# Patient Record
Sex: Male | Born: 1940 | Race: White | Hispanic: No | Smoking: Former smoker
Health system: Southern US, Community
[De-identification: ages and names within clinical notes are randomized; demographics above are authoritative.]

## PROBLEM LIST (undated history)

## (undated) DIAGNOSIS — I509 Heart failure, unspecified: Secondary | ICD-10-CM

## (undated) DIAGNOSIS — J449 Chronic obstructive pulmonary disease, unspecified: Secondary | ICD-10-CM

## (undated) DIAGNOSIS — Z950 Presence of cardiac pacemaker: Secondary | ICD-10-CM

## (undated) DIAGNOSIS — J439 Emphysema, unspecified: Secondary | ICD-10-CM

## (undated) DIAGNOSIS — IMO0001 Reserved for inherently not codable concepts without codable children: Secondary | ICD-10-CM

## (undated) DIAGNOSIS — E7800 Pure hypercholesterolemia, unspecified: Secondary | ICD-10-CM

## (undated) DIAGNOSIS — F329 Major depressive disorder, single episode, unspecified: Secondary | ICD-10-CM

## (undated) DIAGNOSIS — I10 Essential (primary) hypertension: Secondary | ICD-10-CM

## (undated) DIAGNOSIS — Z9981 Dependence on supplemental oxygen: Secondary | ICD-10-CM

## (undated) DIAGNOSIS — F32A Depression, unspecified: Secondary | ICD-10-CM

## (undated) DIAGNOSIS — I219 Acute myocardial infarction, unspecified: Secondary | ICD-10-CM

## (undated) DIAGNOSIS — J189 Pneumonia, unspecified organism: Secondary | ICD-10-CM

## (undated) HISTORY — PX: PACEMAKER PLACEMENT: SHX43

## (undated) HISTORY — PX: INSERT / REPLACE / REMOVE PACEMAKER: SUR710

## (undated) HISTORY — PX: TONSILLECTOMY: SUR1361

## (undated) HISTORY — PX: ANGIOPLASTY: SHX39

## (undated) HISTORY — PX: ABDOMINAL AORTIC ANEURYSM REPAIR: SUR1152

---

## 2004-04-02 ENCOUNTER — Ambulatory Visit: Payer: Self-pay | Admitting: *Deleted

## 2004-04-11 ENCOUNTER — Ambulatory Visit: Payer: Self-pay | Admitting: *Deleted

## 2004-04-16 ENCOUNTER — Ambulatory Visit: Payer: Self-pay | Admitting: *Deleted

## 2004-10-15 ENCOUNTER — Ambulatory Visit: Payer: Self-pay | Admitting: Family Medicine

## 2005-04-24 ENCOUNTER — Ambulatory Visit: Payer: Self-pay | Admitting: Family Medicine

## 2005-10-23 ENCOUNTER — Ambulatory Visit: Payer: Self-pay | Admitting: Family Medicine

## 2009-11-02 ENCOUNTER — Ambulatory Visit: Payer: Self-pay | Admitting: Internal Medicine

## 2011-05-30 ENCOUNTER — Ambulatory Visit: Payer: Self-pay | Admitting: Family Medicine

## 2011-07-22 DIAGNOSIS — E7800 Pure hypercholesterolemia, unspecified: Secondary | ICD-10-CM | POA: Diagnosis not present

## 2011-07-22 DIAGNOSIS — J449 Chronic obstructive pulmonary disease, unspecified: Secondary | ICD-10-CM | POA: Diagnosis not present

## 2011-07-22 DIAGNOSIS — F17200 Nicotine dependence, unspecified, uncomplicated: Secondary | ICD-10-CM | POA: Diagnosis not present

## 2011-07-22 DIAGNOSIS — I2510 Atherosclerotic heart disease of native coronary artery without angina pectoris: Secondary | ICD-10-CM | POA: Diagnosis not present

## 2011-08-12 DIAGNOSIS — I509 Heart failure, unspecified: Secondary | ICD-10-CM | POA: Diagnosis not present

## 2011-08-12 DIAGNOSIS — R0989 Other specified symptoms and signs involving the circulatory and respiratory systems: Secondary | ICD-10-CM | POA: Diagnosis not present

## 2011-08-12 DIAGNOSIS — R6889 Other general symptoms and signs: Secondary | ICD-10-CM | POA: Diagnosis not present

## 2011-08-12 DIAGNOSIS — R748 Abnormal levels of other serum enzymes: Secondary | ICD-10-CM | POA: Diagnosis not present

## 2011-08-12 DIAGNOSIS — R0609 Other forms of dyspnea: Secondary | ICD-10-CM | POA: Diagnosis not present

## 2011-08-12 DIAGNOSIS — I10 Essential (primary) hypertension: Secondary | ICD-10-CM | POA: Diagnosis not present

## 2011-08-12 LAB — CBC
HCT: 48.7 % (ref 40.0–52.0)
HGB: 16.2 g/dL (ref 13.0–18.0)
MCH: 30.2 pg (ref 26.0–34.0)
MCHC: 33.2 g/dL (ref 32.0–36.0)
Platelet: 323 10*3/uL (ref 150–440)
RDW: 14.6 % — ABNORMAL HIGH (ref 11.5–14.5)
WBC: 24.8 10*3/uL — ABNORMAL HIGH (ref 3.8–10.6)

## 2011-08-13 ENCOUNTER — Inpatient Hospital Stay: Payer: Self-pay | Admitting: Specialist

## 2011-08-13 DIAGNOSIS — E785 Hyperlipidemia, unspecified: Secondary | ICD-10-CM | POA: Diagnosis present

## 2011-08-13 DIAGNOSIS — Z23 Encounter for immunization: Secondary | ICD-10-CM | POA: Diagnosis not present

## 2011-08-13 DIAGNOSIS — I2589 Other forms of chronic ischemic heart disease: Secondary | ICD-10-CM | POA: Diagnosis not present

## 2011-08-13 DIAGNOSIS — I129 Hypertensive chronic kidney disease with stage 1 through stage 4 chronic kidney disease, or unspecified chronic kidney disease: Secondary | ICD-10-CM | POA: Diagnosis not present

## 2011-08-13 DIAGNOSIS — I252 Old myocardial infarction: Secondary | ICD-10-CM | POA: Diagnosis not present

## 2011-08-13 DIAGNOSIS — D72829 Elevated white blood cell count, unspecified: Secondary | ICD-10-CM | POA: Diagnosis not present

## 2011-08-13 DIAGNOSIS — Z79899 Other long term (current) drug therapy: Secondary | ICD-10-CM | POA: Diagnosis not present

## 2011-08-13 DIAGNOSIS — J9600 Acute respiratory failure, unspecified whether with hypoxia or hypercapnia: Secondary | ICD-10-CM | POA: Diagnosis not present

## 2011-08-13 DIAGNOSIS — F17200 Nicotine dependence, unspecified, uncomplicated: Secondary | ICD-10-CM | POA: Diagnosis present

## 2011-08-13 DIAGNOSIS — I714 Abdominal aortic aneurysm, without rupture: Secondary | ICD-10-CM | POA: Diagnosis present

## 2011-08-13 DIAGNOSIS — R748 Abnormal levels of other serum enzymes: Secondary | ICD-10-CM | POA: Diagnosis not present

## 2011-08-13 DIAGNOSIS — I10 Essential (primary) hypertension: Secondary | ICD-10-CM | POA: Diagnosis not present

## 2011-08-13 DIAGNOSIS — I5023 Acute on chronic systolic (congestive) heart failure: Secondary | ICD-10-CM | POA: Diagnosis not present

## 2011-08-13 DIAGNOSIS — I509 Heart failure, unspecified: Secondary | ICD-10-CM | POA: Diagnosis not present

## 2011-08-13 DIAGNOSIS — R799 Abnormal finding of blood chemistry, unspecified: Secondary | ICD-10-CM | POA: Diagnosis not present

## 2011-08-13 DIAGNOSIS — Z8249 Family history of ischemic heart disease and other diseases of the circulatory system: Secondary | ICD-10-CM | POA: Diagnosis not present

## 2011-08-13 DIAGNOSIS — J449 Chronic obstructive pulmonary disease, unspecified: Secondary | ICD-10-CM | POA: Diagnosis not present

## 2011-08-13 DIAGNOSIS — Z7982 Long term (current) use of aspirin: Secondary | ICD-10-CM | POA: Diagnosis not present

## 2011-08-13 DIAGNOSIS — I2510 Atherosclerotic heart disease of native coronary artery without angina pectoris: Secondary | ICD-10-CM | POA: Diagnosis present

## 2011-08-13 LAB — BASIC METABOLIC PANEL
Anion Gap: 10 (ref 7–16)
BUN: 21 mg/dL — ABNORMAL HIGH (ref 7–18)
Calcium, Total: 8.7 mg/dL (ref 8.5–10.1)
Chloride: 105 mmol/L (ref 98–107)
EGFR (African American): >60
EGFR (Non-African Amer.): 50 — ABNORMAL LOW
Glucose: 92 mg/dL (ref 65–99)

## 2011-08-13 LAB — URINALYSIS, COMPLETE
Bacteria: NONE SEEN
Glucose,UR: NEGATIVE mg/dL (ref 0–75)
Hyaline Cast: 1
Ketone: NEGATIVE
RBC,UR: 1 /HPF (ref 0–5)
Specific Gravity: 1.005 (ref 1.003–1.030)
WBC UR: 1 /HPF (ref 0–5)

## 2011-08-13 LAB — TROPONIN I
Troponin-I: 0.31 ng/mL — ABNORMAL HIGH
Troponin-I: 1.2 ng/mL — ABNORMAL HIGH
Troponin-I: 1.97 ng/mL — ABNORMAL HIGH

## 2011-08-13 LAB — COMPREHENSIVE METABOLIC PANEL
Albumin: 3.9 g/dL (ref 3.4–5.0)
Alkaline Phosphatase: 90 U/L (ref 50–136)
Anion Gap: 10 (ref 7–16)
Bilirubin,Total: 0.4 mg/dL (ref 0.2–1.0)
Co2: 28 mmol/L (ref 21–32)
Creatinine: 1.57 mg/dL — ABNORMAL HIGH (ref 0.60–1.30)
EGFR (African American): 57 — ABNORMAL LOW
EGFR (Non-African Amer.): 47 — ABNORMAL LOW
Glucose: 188 mg/dL — ABNORMAL HIGH (ref 65–99)
Potassium: 3.8 mmol/L (ref 3.5–5.1)
SGPT (ALT): 20 U/L
Sodium: 143 mmol/L (ref 136–145)
Total Protein: 7.9 g/dL (ref 6.4–8.2)

## 2011-08-14 LAB — CBC WITH DIFFERENTIAL/PLATELET
Basophil #: 0.1 10*3/uL (ref 0.0–0.1)
Basophil %: 0.5 %
Eosinophil #: 0.1 10*3/uL (ref 0.0–0.7)
HGB: 14.4 g/dL (ref 13.0–18.0)
Lymphocyte #: 2.3 10*3/uL (ref 1.0–3.6)
Lymphocyte %: 21.1 %
MCHC: 33 g/dL (ref 32.0–36.0)
MCV: 90 fL (ref 80–100)
Neutrophil #: 7.7 10*3/uL — ABNORMAL HIGH (ref 1.4–6.5)
Neutrophil %: 70.2 %
Platelet: 197 10*3/uL (ref 150–440)
RBC: 4.81 10*6/uL (ref 4.40–5.90)
RDW: 15 % — ABNORMAL HIGH (ref 11.5–14.5)

## 2011-08-14 LAB — COMPREHENSIVE METABOLIC PANEL
Albumin: 3.1 g/dL — ABNORMAL LOW (ref 3.4–5.0)
Alkaline Phosphatase: 67 U/L (ref 50–136)
Bilirubin,Total: 0.4 mg/dL (ref 0.2–1.0)
Calcium, Total: 8.8 mg/dL (ref 8.5–10.1)
Chloride: 103 mmol/L (ref 98–107)
Co2: 28 mmol/L (ref 21–32)
EGFR (African American): 59 — ABNORMAL LOW
Osmolality: 287 (ref 275–301)
SGPT (ALT): 16 U/L
Sodium: 141 mmol/L (ref 136–145)
Total Protein: 6.4 g/dL (ref 6.4–8.2)

## 2011-08-14 LAB — HEMOGLOBIN A1C: Hemoglobin A1C: 6 % (ref 4.2–6.3)

## 2011-08-14 LAB — TSH: Thyroid Stimulating Horm: 1.48 u[IU]/mL

## 2011-08-15 DIAGNOSIS — I129 Hypertensive chronic kidney disease with stage 1 through stage 4 chronic kidney disease, or unspecified chronic kidney disease: Secondary | ICD-10-CM | POA: Diagnosis not present

## 2011-08-15 DIAGNOSIS — I2589 Other forms of chronic ischemic heart disease: Secondary | ICD-10-CM | POA: Diagnosis not present

## 2011-08-15 DIAGNOSIS — I509 Heart failure, unspecified: Secondary | ICD-10-CM | POA: Diagnosis not present

## 2011-08-15 DIAGNOSIS — J449 Chronic obstructive pulmonary disease, unspecified: Secondary | ICD-10-CM | POA: Diagnosis not present

## 2011-08-15 DIAGNOSIS — I5023 Acute on chronic systolic (congestive) heart failure: Secondary | ICD-10-CM | POA: Diagnosis not present

## 2011-08-16 DIAGNOSIS — I5022 Chronic systolic (congestive) heart failure: Secondary | ICD-10-CM | POA: Diagnosis not present

## 2011-08-16 DIAGNOSIS — I059 Rheumatic mitral valve disease, unspecified: Secondary | ICD-10-CM | POA: Diagnosis not present

## 2011-08-16 DIAGNOSIS — I2510 Atherosclerotic heart disease of native coronary artery without angina pectoris: Secondary | ICD-10-CM | POA: Diagnosis not present

## 2011-08-16 DIAGNOSIS — I4891 Unspecified atrial fibrillation: Secondary | ICD-10-CM | POA: Diagnosis not present

## 2011-08-17 DIAGNOSIS — I5023 Acute on chronic systolic (congestive) heart failure: Secondary | ICD-10-CM | POA: Diagnosis not present

## 2011-08-17 DIAGNOSIS — I129 Hypertensive chronic kidney disease with stage 1 through stage 4 chronic kidney disease, or unspecified chronic kidney disease: Secondary | ICD-10-CM | POA: Diagnosis not present

## 2011-08-17 DIAGNOSIS — I509 Heart failure, unspecified: Secondary | ICD-10-CM | POA: Diagnosis not present

## 2011-08-17 DIAGNOSIS — J449 Chronic obstructive pulmonary disease, unspecified: Secondary | ICD-10-CM | POA: Diagnosis not present

## 2011-08-17 DIAGNOSIS — I2589 Other forms of chronic ischemic heart disease: Secondary | ICD-10-CM | POA: Diagnosis not present

## 2011-08-19 DIAGNOSIS — J449 Chronic obstructive pulmonary disease, unspecified: Secondary | ICD-10-CM | POA: Diagnosis not present

## 2011-08-19 DIAGNOSIS — I129 Hypertensive chronic kidney disease with stage 1 through stage 4 chronic kidney disease, or unspecified chronic kidney disease: Secondary | ICD-10-CM | POA: Diagnosis not present

## 2011-08-19 DIAGNOSIS — I5023 Acute on chronic systolic (congestive) heart failure: Secondary | ICD-10-CM | POA: Diagnosis not present

## 2011-08-19 DIAGNOSIS — I2589 Other forms of chronic ischemic heart disease: Secondary | ICD-10-CM | POA: Diagnosis not present

## 2011-08-19 DIAGNOSIS — I509 Heart failure, unspecified: Secondary | ICD-10-CM | POA: Diagnosis not present

## 2011-08-22 DIAGNOSIS — R5381 Other malaise: Secondary | ICD-10-CM | POA: Diagnosis not present

## 2011-08-22 DIAGNOSIS — I2510 Atherosclerotic heart disease of native coronary artery without angina pectoris: Secondary | ICD-10-CM | POA: Diagnosis not present

## 2011-08-22 DIAGNOSIS — J449 Chronic obstructive pulmonary disease, unspecified: Secondary | ICD-10-CM | POA: Diagnosis not present

## 2011-08-22 DIAGNOSIS — E7800 Pure hypercholesterolemia, unspecified: Secondary | ICD-10-CM | POA: Diagnosis not present

## 2011-08-24 DIAGNOSIS — I2589 Other forms of chronic ischemic heart disease: Secondary | ICD-10-CM | POA: Diagnosis not present

## 2011-08-24 DIAGNOSIS — I509 Heart failure, unspecified: Secondary | ICD-10-CM | POA: Diagnosis not present

## 2011-08-24 DIAGNOSIS — I5023 Acute on chronic systolic (congestive) heart failure: Secondary | ICD-10-CM | POA: Diagnosis not present

## 2011-08-24 DIAGNOSIS — I129 Hypertensive chronic kidney disease with stage 1 through stage 4 chronic kidney disease, or unspecified chronic kidney disease: Secondary | ICD-10-CM | POA: Diagnosis not present

## 2011-08-24 DIAGNOSIS — J449 Chronic obstructive pulmonary disease, unspecified: Secondary | ICD-10-CM | POA: Diagnosis not present

## 2011-08-29 DIAGNOSIS — J449 Chronic obstructive pulmonary disease, unspecified: Secondary | ICD-10-CM | POA: Diagnosis not present

## 2011-08-29 DIAGNOSIS — I509 Heart failure, unspecified: Secondary | ICD-10-CM | POA: Diagnosis not present

## 2011-08-29 DIAGNOSIS — I5023 Acute on chronic systolic (congestive) heart failure: Secondary | ICD-10-CM | POA: Diagnosis not present

## 2011-08-29 DIAGNOSIS — I129 Hypertensive chronic kidney disease with stage 1 through stage 4 chronic kidney disease, or unspecified chronic kidney disease: Secondary | ICD-10-CM | POA: Diagnosis not present

## 2011-08-29 DIAGNOSIS — I2589 Other forms of chronic ischemic heart disease: Secondary | ICD-10-CM | POA: Diagnosis not present

## 2011-08-30 DIAGNOSIS — I129 Hypertensive chronic kidney disease with stage 1 through stage 4 chronic kidney disease, or unspecified chronic kidney disease: Secondary | ICD-10-CM | POA: Diagnosis not present

## 2011-08-30 DIAGNOSIS — I2510 Atherosclerotic heart disease of native coronary artery without angina pectoris: Secondary | ICD-10-CM | POA: Diagnosis not present

## 2011-08-30 DIAGNOSIS — I10 Essential (primary) hypertension: Secondary | ICD-10-CM | POA: Diagnosis not present

## 2011-08-30 DIAGNOSIS — E785 Hyperlipidemia, unspecified: Secondary | ICD-10-CM | POA: Diagnosis not present

## 2011-08-30 DIAGNOSIS — I509 Heart failure, unspecified: Secondary | ICD-10-CM | POA: Diagnosis not present

## 2011-08-30 DIAGNOSIS — J449 Chronic obstructive pulmonary disease, unspecified: Secondary | ICD-10-CM | POA: Diagnosis not present

## 2011-08-30 DIAGNOSIS — I5023 Acute on chronic systolic (congestive) heart failure: Secondary | ICD-10-CM | POA: Diagnosis not present

## 2011-09-03 DIAGNOSIS — I509 Heart failure, unspecified: Secondary | ICD-10-CM | POA: Diagnosis not present

## 2011-09-03 DIAGNOSIS — I5023 Acute on chronic systolic (congestive) heart failure: Secondary | ICD-10-CM | POA: Diagnosis not present

## 2011-09-03 DIAGNOSIS — I2589 Other forms of chronic ischemic heart disease: Secondary | ICD-10-CM | POA: Diagnosis not present

## 2011-09-03 DIAGNOSIS — I129 Hypertensive chronic kidney disease with stage 1 through stage 4 chronic kidney disease, or unspecified chronic kidney disease: Secondary | ICD-10-CM | POA: Diagnosis not present

## 2011-09-03 DIAGNOSIS — J449 Chronic obstructive pulmonary disease, unspecified: Secondary | ICD-10-CM | POA: Diagnosis not present

## 2011-09-11 DIAGNOSIS — I10 Essential (primary) hypertension: Secondary | ICD-10-CM | POA: Diagnosis not present

## 2011-09-11 DIAGNOSIS — J449 Chronic obstructive pulmonary disease, unspecified: Secondary | ICD-10-CM | POA: Diagnosis not present

## 2011-09-11 DIAGNOSIS — E7800 Pure hypercholesterolemia, unspecified: Secondary | ICD-10-CM | POA: Diagnosis not present

## 2011-09-11 DIAGNOSIS — I2510 Atherosclerotic heart disease of native coronary artery without angina pectoris: Secondary | ICD-10-CM | POA: Diagnosis not present

## 2011-09-18 DIAGNOSIS — I2589 Other forms of chronic ischemic heart disease: Secondary | ICD-10-CM | POA: Diagnosis not present

## 2011-09-18 DIAGNOSIS — I129 Hypertensive chronic kidney disease with stage 1 through stage 4 chronic kidney disease, or unspecified chronic kidney disease: Secondary | ICD-10-CM | POA: Diagnosis not present

## 2011-09-18 DIAGNOSIS — I5023 Acute on chronic systolic (congestive) heart failure: Secondary | ICD-10-CM | POA: Diagnosis not present

## 2011-09-18 DIAGNOSIS — J449 Chronic obstructive pulmonary disease, unspecified: Secondary | ICD-10-CM | POA: Diagnosis not present

## 2011-09-18 DIAGNOSIS — I509 Heart failure, unspecified: Secondary | ICD-10-CM | POA: Diagnosis not present

## 2011-11-13 DIAGNOSIS — Z79899 Other long term (current) drug therapy: Secondary | ICD-10-CM | POA: Diagnosis not present

## 2011-11-13 DIAGNOSIS — I10 Essential (primary) hypertension: Secondary | ICD-10-CM | POA: Diagnosis not present

## 2011-11-13 DIAGNOSIS — I509 Heart failure, unspecified: Secondary | ICD-10-CM | POA: Diagnosis not present

## 2011-11-13 DIAGNOSIS — I2510 Atherosclerotic heart disease of native coronary artery without angina pectoris: Secondary | ICD-10-CM | POA: Diagnosis not present

## 2011-11-22 DIAGNOSIS — I10 Essential (primary) hypertension: Secondary | ICD-10-CM | POA: Diagnosis not present

## 2011-11-22 DIAGNOSIS — I6529 Occlusion and stenosis of unspecified carotid artery: Secondary | ICD-10-CM | POA: Diagnosis not present

## 2011-11-22 DIAGNOSIS — I714 Abdominal aortic aneurysm, without rupture: Secondary | ICD-10-CM | POA: Diagnosis not present

## 2011-11-26 ENCOUNTER — Ambulatory Visit: Payer: Self-pay | Admitting: Vascular Surgery

## 2011-11-26 DIAGNOSIS — I714 Abdominal aortic aneurysm, without rupture: Secondary | ICD-10-CM | POA: Diagnosis not present

## 2011-11-26 DIAGNOSIS — I716 Thoracoabdominal aortic aneurysm, without rupture: Secondary | ICD-10-CM | POA: Diagnosis not present

## 2011-11-29 DIAGNOSIS — I714 Abdominal aortic aneurysm, without rupture: Secondary | ICD-10-CM | POA: Diagnosis not present

## 2011-11-29 DIAGNOSIS — I6529 Occlusion and stenosis of unspecified carotid artery: Secondary | ICD-10-CM | POA: Diagnosis not present

## 2011-11-29 DIAGNOSIS — I10 Essential (primary) hypertension: Secondary | ICD-10-CM | POA: Diagnosis not present

## 2011-11-29 DIAGNOSIS — I716 Thoracoabdominal aortic aneurysm, without rupture: Secondary | ICD-10-CM | POA: Diagnosis not present

## 2011-12-06 DIAGNOSIS — I209 Angina pectoris, unspecified: Secondary | ICD-10-CM | POA: Diagnosis not present

## 2011-12-06 DIAGNOSIS — I2510 Atherosclerotic heart disease of native coronary artery without angina pectoris: Secondary | ICD-10-CM | POA: Diagnosis not present

## 2011-12-06 DIAGNOSIS — I110 Hypertensive heart disease with heart failure: Secondary | ICD-10-CM | POA: Diagnosis not present

## 2011-12-11 ENCOUNTER — Ambulatory Visit: Payer: Self-pay | Admitting: Vascular Surgery

## 2011-12-11 DIAGNOSIS — I714 Abdominal aortic aneurysm, without rupture: Secondary | ICD-10-CM | POA: Diagnosis not present

## 2011-12-11 DIAGNOSIS — Z01812 Encounter for preprocedural laboratory examination: Secondary | ICD-10-CM | POA: Diagnosis not present

## 2011-12-11 LAB — BASIC METABOLIC PANEL
Anion Gap: 9 (ref 7–16)
Calcium, Total: 9 mg/dL (ref 8.5–10.1)
Chloride: 104 mmol/L (ref 98–107)
Glucose: 96 mg/dL (ref 65–99)
Osmolality: 288 (ref 275–301)
Sodium: 141 mmol/L (ref 136–145)

## 2011-12-11 LAB — CBC
HCT: 42.9 % (ref 40.0–52.0)
HGB: 14.4 g/dL (ref 13.0–18.0)
MCH: 30.4 pg (ref 26.0–34.0)
MCV: 91 fL (ref 80–100)
RBC: 4.72 10*6/uL (ref 4.40–5.90)
RDW: 13.9 % (ref 11.5–14.5)

## 2011-12-13 DIAGNOSIS — I209 Angina pectoris, unspecified: Secondary | ICD-10-CM | POA: Diagnosis not present

## 2011-12-13 DIAGNOSIS — E782 Mixed hyperlipidemia: Secondary | ICD-10-CM | POA: Diagnosis not present

## 2011-12-13 DIAGNOSIS — I2510 Atherosclerotic heart disease of native coronary artery without angina pectoris: Secondary | ICD-10-CM | POA: Diagnosis not present

## 2011-12-13 DIAGNOSIS — I5022 Chronic systolic (congestive) heart failure: Secondary | ICD-10-CM | POA: Diagnosis not present

## 2011-12-13 DIAGNOSIS — I059 Rheumatic mitral valve disease, unspecified: Secondary | ICD-10-CM | POA: Diagnosis not present

## 2011-12-16 DIAGNOSIS — I2510 Atherosclerotic heart disease of native coronary artery without angina pectoris: Secondary | ICD-10-CM | POA: Diagnosis not present

## 2011-12-16 DIAGNOSIS — I059 Rheumatic mitral valve disease, unspecified: Secondary | ICD-10-CM | POA: Diagnosis not present

## 2011-12-16 DIAGNOSIS — E782 Mixed hyperlipidemia: Secondary | ICD-10-CM | POA: Diagnosis not present

## 2011-12-16 DIAGNOSIS — I5022 Chronic systolic (congestive) heart failure: Secondary | ICD-10-CM | POA: Diagnosis not present

## 2011-12-16 DIAGNOSIS — I209 Angina pectoris, unspecified: Secondary | ICD-10-CM | POA: Diagnosis not present

## 2011-12-18 ENCOUNTER — Inpatient Hospital Stay: Payer: Self-pay | Admitting: Vascular Surgery

## 2011-12-18 DIAGNOSIS — F17200 Nicotine dependence, unspecified, uncomplicated: Secondary | ICD-10-CM | POA: Diagnosis present

## 2011-12-18 DIAGNOSIS — E785 Hyperlipidemia, unspecified: Secondary | ICD-10-CM | POA: Diagnosis present

## 2011-12-18 DIAGNOSIS — I4891 Unspecified atrial fibrillation: Secondary | ICD-10-CM | POA: Diagnosis present

## 2011-12-18 DIAGNOSIS — J449 Chronic obstructive pulmonary disease, unspecified: Secondary | ICD-10-CM | POA: Diagnosis not present

## 2011-12-18 DIAGNOSIS — I10 Essential (primary) hypertension: Secondary | ICD-10-CM | POA: Diagnosis not present

## 2011-12-18 DIAGNOSIS — I252 Old myocardial infarction: Secondary | ICD-10-CM | POA: Diagnosis not present

## 2011-12-18 DIAGNOSIS — I129 Hypertensive chronic kidney disease with stage 1 through stage 4 chronic kidney disease, or unspecified chronic kidney disease: Secondary | ICD-10-CM | POA: Diagnosis not present

## 2011-12-18 DIAGNOSIS — I6529 Occlusion and stenosis of unspecified carotid artery: Secondary | ICD-10-CM | POA: Diagnosis present

## 2011-12-18 DIAGNOSIS — I2510 Atherosclerotic heart disease of native coronary artery without angina pectoris: Secondary | ICD-10-CM | POA: Diagnosis present

## 2011-12-18 DIAGNOSIS — Z8249 Family history of ischemic heart disease and other diseases of the circulatory system: Secondary | ICD-10-CM | POA: Diagnosis not present

## 2011-12-18 DIAGNOSIS — I509 Heart failure, unspecified: Secondary | ICD-10-CM | POA: Diagnosis not present

## 2011-12-18 DIAGNOSIS — Z7902 Long term (current) use of antithrombotics/antiplatelets: Secondary | ICD-10-CM | POA: Diagnosis not present

## 2011-12-18 DIAGNOSIS — I714 Abdominal aortic aneurysm, without rupture, unspecified: Secondary | ICD-10-CM | POA: Diagnosis not present

## 2011-12-18 DIAGNOSIS — J438 Other emphysema: Secondary | ICD-10-CM | POA: Diagnosis present

## 2011-12-18 DIAGNOSIS — N189 Chronic kidney disease, unspecified: Secondary | ICD-10-CM | POA: Diagnosis not present

## 2011-12-18 DIAGNOSIS — I428 Other cardiomyopathies: Secondary | ICD-10-CM | POA: Diagnosis present

## 2011-12-19 LAB — COMPREHENSIVE METABOLIC PANEL
Albumin: 2.9 g/dL — ABNORMAL LOW (ref 3.4–5.0)
BUN: 23 mg/dL — ABNORMAL HIGH (ref 7–18)
Bilirubin,Total: 0.6 mg/dL (ref 0.2–1.0)
Chloride: 105 mmol/L (ref 98–107)
Co2: 26 mmol/L (ref 21–32)
Creatinine: 1.63 mg/dL — ABNORMAL HIGH (ref 0.60–1.30)
Glucose: 100 mg/dL — ABNORMAL HIGH (ref 65–99)
Osmolality: 281 (ref 275–301)
Potassium: 4 mmol/L (ref 3.5–5.1)
SGPT (ALT): 11 U/L — ABNORMAL LOW
Sodium: 139 mmol/L (ref 136–145)
Total Protein: 5.8 g/dL — ABNORMAL LOW (ref 6.4–8.2)

## 2011-12-19 LAB — PROTIME-INR
INR: 1.2
Prothrombin Time: 15.4 secs — ABNORMAL HIGH (ref 11.5–14.7)

## 2011-12-19 LAB — CBC WITH DIFFERENTIAL/PLATELET
Basophil %: 0.5 %
Eosinophil #: 0.2 10*3/uL (ref 0.0–0.7)
Eosinophil %: 1.8 %
HCT: 38 % — ABNORMAL LOW (ref 40.0–52.0)
MCHC: 33.5 g/dL (ref 32.0–36.0)
MCV: 91 fL (ref 80–100)
Monocyte #: 0.9 x10 3/mm (ref 0.2–1.0)
Monocyte %: 9 %
Neutrophil #: 7.9 10*3/uL — ABNORMAL HIGH (ref 1.4–6.5)
Neutrophil %: 74.6 %
Platelet: 178 10*3/uL (ref 150–440)
RBC: 4.16 10*6/uL — ABNORMAL LOW (ref 4.40–5.90)
RDW: 13.5 % (ref 11.5–14.5)
WBC: 10.6 10*3/uL (ref 3.8–10.6)

## 2011-12-19 LAB — PHOSPHORUS: Phosphorus: 3.4 mg/dL (ref 2.5–4.9)

## 2011-12-19 LAB — MAGNESIUM: Magnesium: 1.9 mg/dL

## 2011-12-19 LAB — APTT: Activated PTT: 32 secs (ref 23.6–35.9)

## 2012-01-23 DIAGNOSIS — I714 Abdominal aortic aneurysm, without rupture: Secondary | ICD-10-CM | POA: Diagnosis not present

## 2012-02-19 DIAGNOSIS — I509 Heart failure, unspecified: Secondary | ICD-10-CM | POA: Diagnosis not present

## 2012-02-19 DIAGNOSIS — I714 Abdominal aortic aneurysm, without rupture: Secondary | ICD-10-CM | POA: Diagnosis not present

## 2012-02-19 DIAGNOSIS — I110 Hypertensive heart disease with heart failure: Secondary | ICD-10-CM | POA: Diagnosis not present

## 2012-02-19 DIAGNOSIS — I059 Rheumatic mitral valve disease, unspecified: Secondary | ICD-10-CM | POA: Diagnosis not present

## 2012-02-19 DIAGNOSIS — I5022 Chronic systolic (congestive) heart failure: Secondary | ICD-10-CM | POA: Diagnosis not present

## 2012-02-26 DIAGNOSIS — Z79899 Other long term (current) drug therapy: Secondary | ICD-10-CM | POA: Diagnosis not present

## 2012-03-19 DIAGNOSIS — J449 Chronic obstructive pulmonary disease, unspecified: Secondary | ICD-10-CM | POA: Diagnosis not present

## 2012-03-19 DIAGNOSIS — F329 Major depressive disorder, single episode, unspecified: Secondary | ICD-10-CM | POA: Diagnosis not present

## 2012-03-19 DIAGNOSIS — I10 Essential (primary) hypertension: Secondary | ICD-10-CM | POA: Diagnosis not present

## 2012-03-19 DIAGNOSIS — I2510 Atherosclerotic heart disease of native coronary artery without angina pectoris: Secondary | ICD-10-CM | POA: Diagnosis not present

## 2012-04-20 DIAGNOSIS — E782 Mixed hyperlipidemia: Secondary | ICD-10-CM | POA: Diagnosis not present

## 2012-04-20 DIAGNOSIS — I5022 Chronic systolic (congestive) heart failure: Secondary | ICD-10-CM | POA: Diagnosis not present

## 2012-04-20 DIAGNOSIS — I2510 Atherosclerotic heart disease of native coronary artery without angina pectoris: Secondary | ICD-10-CM | POA: Diagnosis not present

## 2012-04-20 DIAGNOSIS — R609 Edema, unspecified: Secondary | ICD-10-CM | POA: Diagnosis not present

## 2012-04-23 DIAGNOSIS — Z23 Encounter for immunization: Secondary | ICD-10-CM | POA: Diagnosis not present

## 2012-04-23 DIAGNOSIS — F329 Major depressive disorder, single episode, unspecified: Secondary | ICD-10-CM | POA: Diagnosis not present

## 2012-05-01 DIAGNOSIS — I714 Abdominal aortic aneurysm, without rupture: Secondary | ICD-10-CM | POA: Diagnosis not present

## 2012-05-01 DIAGNOSIS — I10 Essential (primary) hypertension: Secondary | ICD-10-CM | POA: Diagnosis not present

## 2012-06-05 DIAGNOSIS — I2589 Other forms of chronic ischemic heart disease: Secondary | ICD-10-CM | POA: Diagnosis not present

## 2012-06-05 DIAGNOSIS — I5022 Chronic systolic (congestive) heart failure: Secondary | ICD-10-CM | POA: Diagnosis not present

## 2012-06-05 DIAGNOSIS — I4949 Other premature depolarization: Secondary | ICD-10-CM | POA: Diagnosis not present

## 2012-06-05 DIAGNOSIS — Z8679 Personal history of other diseases of the circulatory system: Secondary | ICD-10-CM | POA: Diagnosis not present

## 2012-06-25 ENCOUNTER — Ambulatory Visit: Payer: Self-pay | Admitting: Family Medicine

## 2012-06-25 DIAGNOSIS — J449 Chronic obstructive pulmonary disease, unspecified: Secondary | ICD-10-CM | POA: Diagnosis not present

## 2012-06-25 DIAGNOSIS — R05 Cough: Secondary | ICD-10-CM | POA: Diagnosis not present

## 2012-06-25 DIAGNOSIS — F329 Major depressive disorder, single episode, unspecified: Secondary | ICD-10-CM | POA: Diagnosis not present

## 2012-06-25 DIAGNOSIS — Z23 Encounter for immunization: Secondary | ICD-10-CM | POA: Diagnosis not present

## 2012-06-25 DIAGNOSIS — J209 Acute bronchitis, unspecified: Secondary | ICD-10-CM | POA: Diagnosis not present

## 2012-07-21 ENCOUNTER — Ambulatory Visit: Payer: Self-pay | Admitting: Cardiovascular Disease

## 2012-07-21 DIAGNOSIS — Z8679 Personal history of other diseases of the circulatory system: Secondary | ICD-10-CM | POA: Diagnosis not present

## 2012-07-21 DIAGNOSIS — Z01812 Encounter for preprocedural laboratory examination: Secondary | ICD-10-CM | POA: Diagnosis not present

## 2012-07-21 DIAGNOSIS — I509 Heart failure, unspecified: Secondary | ICD-10-CM | POA: Diagnosis not present

## 2012-07-21 DIAGNOSIS — I110 Hypertensive heart disease with heart failure: Secondary | ICD-10-CM | POA: Diagnosis not present

## 2012-07-21 DIAGNOSIS — I2589 Other forms of chronic ischemic heart disease: Secondary | ICD-10-CM | POA: Diagnosis not present

## 2012-07-21 DIAGNOSIS — I4949 Other premature depolarization: Secondary | ICD-10-CM | POA: Diagnosis not present

## 2012-07-21 DIAGNOSIS — I5022 Chronic systolic (congestive) heart failure: Secondary | ICD-10-CM | POA: Diagnosis not present

## 2012-07-21 LAB — URINALYSIS, COMPLETE
Bacteria: NONE SEEN
Bilirubin,UR: NEGATIVE
Blood: NEGATIVE
Glucose,UR: NEGATIVE mg/dL (ref 0–75)
Ketone: NEGATIVE
Leukocyte Esterase: NEGATIVE
RBC,UR: <1 /HPF (ref 0–5)
Specific Gravity: 1.006 (ref 1.003–1.030)
Squamous Epithelial: <1

## 2012-07-21 LAB — CBC WITH DIFFERENTIAL/PLATELET
Basophil #: 0.1 10*3/uL (ref 0.0–0.1)
Eosinophil %: 4 %
HGB: 13.6 g/dL (ref 13.0–18.0)
Lymphocyte %: 24.1 %
MCH: 29.1 pg (ref 26.0–34.0)
MCHC: 33.6 g/dL (ref 32.0–36.0)
Monocyte %: 8 %
Neutrophil #: 6.1 10*3/uL (ref 1.4–6.5)
Neutrophil %: 63 %
Platelet: 266 10*3/uL (ref 150–440)
RBC: 4.68 10*6/uL (ref 4.40–5.90)
WBC: 9.7 10*3/uL (ref 3.8–10.6)

## 2012-07-21 LAB — BASIC METABOLIC PANEL
BUN: 32 mg/dL — ABNORMAL HIGH (ref 7–18)
Calcium, Total: 9 mg/dL (ref 8.5–10.1)
Chloride: 105 mmol/L (ref 98–107)
EGFR (African American): 45 — ABNORMAL LOW
EGFR (Non-African Amer.): 39 — ABNORMAL LOW
Glucose: 99 mg/dL (ref 65–99)
Potassium: 4.4 mmol/L (ref 3.5–5.1)
Sodium: 138 mmol/L (ref 136–145)

## 2012-07-21 LAB — PROTIME-INR
INR: 1.1
Prothrombin Time: 14.3 secs (ref 11.5–14.7)

## 2012-07-24 ENCOUNTER — Ambulatory Visit: Payer: Self-pay | Admitting: Cardiology

## 2012-07-24 DIAGNOSIS — Z79899 Other long term (current) drug therapy: Secondary | ICD-10-CM | POA: Diagnosis not present

## 2012-07-24 DIAGNOSIS — I509 Heart failure, unspecified: Secondary | ICD-10-CM | POA: Diagnosis not present

## 2012-07-24 DIAGNOSIS — I499 Cardiac arrhythmia, unspecified: Secondary | ICD-10-CM | POA: Diagnosis not present

## 2012-07-24 DIAGNOSIS — I5022 Chronic systolic (congestive) heart failure: Secondary | ICD-10-CM | POA: Diagnosis not present

## 2012-07-24 DIAGNOSIS — Z7982 Long term (current) use of aspirin: Secondary | ICD-10-CM | POA: Diagnosis not present

## 2012-07-24 DIAGNOSIS — I259 Chronic ischemic heart disease, unspecified: Secondary | ICD-10-CM | POA: Diagnosis not present

## 2012-07-24 DIAGNOSIS — J449 Chronic obstructive pulmonary disease, unspecified: Secondary | ICD-10-CM | POA: Diagnosis not present

## 2012-07-24 DIAGNOSIS — I2589 Other forms of chronic ischemic heart disease: Secondary | ICD-10-CM | POA: Diagnosis not present

## 2012-07-25 DIAGNOSIS — I2589 Other forms of chronic ischemic heart disease: Secondary | ICD-10-CM | POA: Diagnosis not present

## 2012-07-25 DIAGNOSIS — J449 Chronic obstructive pulmonary disease, unspecified: Secondary | ICD-10-CM | POA: Diagnosis not present

## 2012-07-25 DIAGNOSIS — I509 Heart failure, unspecified: Secondary | ICD-10-CM | POA: Diagnosis not present

## 2012-07-25 DIAGNOSIS — Z7982 Long term (current) use of aspirin: Secondary | ICD-10-CM | POA: Diagnosis not present

## 2012-07-25 DIAGNOSIS — I428 Other cardiomyopathies: Secondary | ICD-10-CM | POA: Diagnosis not present

## 2012-07-25 DIAGNOSIS — Z79899 Other long term (current) drug therapy: Secondary | ICD-10-CM | POA: Diagnosis not present

## 2012-07-25 DIAGNOSIS — Z950 Presence of cardiac pacemaker: Secondary | ICD-10-CM | POA: Diagnosis not present

## 2012-07-27 DIAGNOSIS — I714 Abdominal aortic aneurysm, without rupture: Secondary | ICD-10-CM | POA: Diagnosis not present

## 2012-07-27 DIAGNOSIS — J449 Chronic obstructive pulmonary disease, unspecified: Secondary | ICD-10-CM | POA: Diagnosis not present

## 2012-07-27 DIAGNOSIS — F329 Major depressive disorder, single episode, unspecified: Secondary | ICD-10-CM | POA: Diagnosis not present

## 2012-07-27 DIAGNOSIS — I2510 Atherosclerotic heart disease of native coronary artery without angina pectoris: Secondary | ICD-10-CM | POA: Diagnosis not present

## 2012-07-27 DIAGNOSIS — E7800 Pure hypercholesterolemia, unspecified: Secondary | ICD-10-CM | POA: Diagnosis not present

## 2012-07-27 DIAGNOSIS — E875 Hyperkalemia: Secondary | ICD-10-CM | POA: Diagnosis not present

## 2012-08-13 DIAGNOSIS — I5022 Chronic systolic (congestive) heart failure: Secondary | ICD-10-CM | POA: Diagnosis not present

## 2012-09-15 DIAGNOSIS — I2510 Atherosclerotic heart disease of native coronary artery without angina pectoris: Secondary | ICD-10-CM | POA: Diagnosis not present

## 2012-09-15 DIAGNOSIS — Z95810 Presence of automatic (implantable) cardiac defibrillator: Secondary | ICD-10-CM | POA: Diagnosis not present

## 2012-09-15 DIAGNOSIS — I10 Essential (primary) hypertension: Secondary | ICD-10-CM | POA: Diagnosis not present

## 2012-09-15 DIAGNOSIS — R0989 Other specified symptoms and signs involving the circulatory and respiratory systems: Secondary | ICD-10-CM | POA: Diagnosis not present

## 2012-09-15 DIAGNOSIS — R0609 Other forms of dyspnea: Secondary | ICD-10-CM | POA: Diagnosis not present

## 2012-10-20 DIAGNOSIS — I059 Rheumatic mitral valve disease, unspecified: Secondary | ICD-10-CM | POA: Diagnosis not present

## 2012-10-20 DIAGNOSIS — I472 Ventricular tachycardia: Secondary | ICD-10-CM | POA: Diagnosis not present

## 2012-10-20 DIAGNOSIS — I5022 Chronic systolic (congestive) heart failure: Secondary | ICD-10-CM | POA: Diagnosis not present

## 2012-10-20 DIAGNOSIS — I2510 Atherosclerotic heart disease of native coronary artery without angina pectoris: Secondary | ICD-10-CM | POA: Diagnosis not present

## 2012-11-03 DIAGNOSIS — I499 Cardiac arrhythmia, unspecified: Secondary | ICD-10-CM | POA: Diagnosis not present

## 2012-11-03 DIAGNOSIS — I10 Essential (primary) hypertension: Secondary | ICD-10-CM | POA: Diagnosis not present

## 2012-11-03 DIAGNOSIS — I714 Abdominal aortic aneurysm, without rupture: Secondary | ICD-10-CM | POA: Diagnosis not present

## 2012-11-23 DIAGNOSIS — R5383 Other fatigue: Secondary | ICD-10-CM | POA: Diagnosis not present

## 2012-11-23 DIAGNOSIS — J449 Chronic obstructive pulmonary disease, unspecified: Secondary | ICD-10-CM | POA: Diagnosis not present

## 2012-11-23 DIAGNOSIS — I2510 Atherosclerotic heart disease of native coronary artery without angina pectoris: Secondary | ICD-10-CM | POA: Diagnosis not present

## 2012-11-23 DIAGNOSIS — R5381 Other malaise: Secondary | ICD-10-CM | POA: Diagnosis not present

## 2012-11-23 DIAGNOSIS — I714 Abdominal aortic aneurysm, without rupture: Secondary | ICD-10-CM | POA: Diagnosis not present

## 2013-01-21 DIAGNOSIS — I519 Heart disease, unspecified: Secondary | ICD-10-CM | POA: Diagnosis not present

## 2013-01-21 DIAGNOSIS — I2510 Atherosclerotic heart disease of native coronary artery without angina pectoris: Secondary | ICD-10-CM | POA: Diagnosis not present

## 2013-01-21 DIAGNOSIS — R0989 Other specified symptoms and signs involving the circulatory and respiratory systems: Secondary | ICD-10-CM | POA: Diagnosis not present

## 2013-01-21 DIAGNOSIS — R0609 Other forms of dyspnea: Secondary | ICD-10-CM | POA: Diagnosis not present

## 2013-01-21 DIAGNOSIS — Z95810 Presence of automatic (implantable) cardiac defibrillator: Secondary | ICD-10-CM | POA: Diagnosis not present

## 2013-03-27 DIAGNOSIS — J449 Chronic obstructive pulmonary disease, unspecified: Secondary | ICD-10-CM | POA: Diagnosis not present

## 2013-03-27 DIAGNOSIS — I714 Abdominal aortic aneurysm, without rupture: Secondary | ICD-10-CM | POA: Diagnosis not present

## 2013-03-27 DIAGNOSIS — R5381 Other malaise: Secondary | ICD-10-CM | POA: Diagnosis not present

## 2013-03-27 DIAGNOSIS — I2510 Atherosclerotic heart disease of native coronary artery without angina pectoris: Secondary | ICD-10-CM | POA: Diagnosis not present

## 2013-03-27 DIAGNOSIS — Z23 Encounter for immunization: Secondary | ICD-10-CM | POA: Diagnosis not present

## 2013-03-29 DIAGNOSIS — I2510 Atherosclerotic heart disease of native coronary artery without angina pectoris: Secondary | ICD-10-CM | POA: Diagnosis not present

## 2013-03-29 DIAGNOSIS — Z23 Encounter for immunization: Secondary | ICD-10-CM | POA: Diagnosis not present

## 2013-03-29 DIAGNOSIS — I10 Essential (primary) hypertension: Secondary | ICD-10-CM | POA: Diagnosis not present

## 2013-03-29 DIAGNOSIS — J449 Chronic obstructive pulmonary disease, unspecified: Secondary | ICD-10-CM | POA: Diagnosis not present

## 2013-03-29 DIAGNOSIS — E7800 Pure hypercholesterolemia, unspecified: Secondary | ICD-10-CM | POA: Diagnosis not present

## 2013-04-13 DIAGNOSIS — I519 Heart disease, unspecified: Secondary | ICD-10-CM | POA: Diagnosis not present

## 2013-05-11 DIAGNOSIS — I5022 Chronic systolic (congestive) heart failure: Secondary | ICD-10-CM | POA: Diagnosis not present

## 2013-05-11 DIAGNOSIS — E782 Mixed hyperlipidemia: Secondary | ICD-10-CM | POA: Diagnosis not present

## 2013-05-11 DIAGNOSIS — R0602 Shortness of breath: Secondary | ICD-10-CM | POA: Diagnosis not present

## 2013-05-11 DIAGNOSIS — I2510 Atherosclerotic heart disease of native coronary artery without angina pectoris: Secondary | ICD-10-CM | POA: Diagnosis not present

## 2013-07-20 DIAGNOSIS — I519 Heart disease, unspecified: Secondary | ICD-10-CM | POA: Diagnosis not present

## 2013-07-20 DIAGNOSIS — R0602 Shortness of breath: Secondary | ICD-10-CM | POA: Diagnosis not present

## 2013-08-24 DIAGNOSIS — Z1331 Encounter for screening for depression: Secondary | ICD-10-CM | POA: Diagnosis not present

## 2013-08-24 DIAGNOSIS — J449 Chronic obstructive pulmonary disease, unspecified: Secondary | ICD-10-CM | POA: Diagnosis not present

## 2013-08-24 DIAGNOSIS — Z1339 Encounter for screening examination for other mental health and behavioral disorders: Secondary | ICD-10-CM | POA: Diagnosis not present

## 2013-08-24 DIAGNOSIS — I2510 Atherosclerotic heart disease of native coronary artery without angina pectoris: Secondary | ICD-10-CM | POA: Diagnosis not present

## 2013-08-24 DIAGNOSIS — Z0000 Encounter for general adult medical examination without abnormal findings: Secondary | ICD-10-CM | POA: Diagnosis not present

## 2013-08-24 DIAGNOSIS — E7800 Pure hypercholesterolemia, unspecified: Secondary | ICD-10-CM | POA: Diagnosis not present

## 2013-08-24 DIAGNOSIS — Z23 Encounter for immunization: Secondary | ICD-10-CM | POA: Diagnosis not present

## 2013-09-28 DIAGNOSIS — I519 Heart disease, unspecified: Secondary | ICD-10-CM | POA: Diagnosis not present

## 2013-11-03 DIAGNOSIS — I714 Abdominal aortic aneurysm, without rupture, unspecified: Secondary | ICD-10-CM | POA: Diagnosis not present

## 2013-11-03 DIAGNOSIS — I5022 Chronic systolic (congestive) heart failure: Secondary | ICD-10-CM | POA: Diagnosis not present

## 2013-11-03 DIAGNOSIS — I2510 Atherosclerotic heart disease of native coronary artery without angina pectoris: Secondary | ICD-10-CM | POA: Diagnosis not present

## 2013-11-03 DIAGNOSIS — I10 Essential (primary) hypertension: Secondary | ICD-10-CM | POA: Diagnosis not present

## 2013-11-09 DIAGNOSIS — E785 Hyperlipidemia, unspecified: Secondary | ICD-10-CM | POA: Diagnosis not present

## 2013-11-09 DIAGNOSIS — I714 Abdominal aortic aneurysm, without rupture, unspecified: Secondary | ICD-10-CM | POA: Diagnosis not present

## 2013-11-09 DIAGNOSIS — I499 Cardiac arrhythmia, unspecified: Secondary | ICD-10-CM | POA: Diagnosis not present

## 2013-11-09 DIAGNOSIS — I509 Heart failure, unspecified: Secondary | ICD-10-CM | POA: Diagnosis not present

## 2013-12-02 DIAGNOSIS — I6529 Occlusion and stenosis of unspecified carotid artery: Secondary | ICD-10-CM | POA: Diagnosis not present

## 2013-12-28 DIAGNOSIS — I519 Heart disease, unspecified: Secondary | ICD-10-CM | POA: Diagnosis not present

## 2013-12-28 DIAGNOSIS — I4891 Unspecified atrial fibrillation: Secondary | ICD-10-CM | POA: Diagnosis not present

## 2014-01-24 DIAGNOSIS — R5381 Other malaise: Secondary | ICD-10-CM | POA: Diagnosis not present

## 2014-01-24 DIAGNOSIS — Z1331 Encounter for screening for depression: Secondary | ICD-10-CM | POA: Diagnosis not present

## 2014-01-24 DIAGNOSIS — Z1339 Encounter for screening examination for other mental health and behavioral disorders: Secondary | ICD-10-CM | POA: Diagnosis not present

## 2014-01-24 DIAGNOSIS — J449 Chronic obstructive pulmonary disease, unspecified: Secondary | ICD-10-CM | POA: Diagnosis not present

## 2014-01-24 DIAGNOSIS — I10 Essential (primary) hypertension: Secondary | ICD-10-CM | POA: Diagnosis not present

## 2014-01-24 DIAGNOSIS — E7800 Pure hypercholesterolemia, unspecified: Secondary | ICD-10-CM | POA: Diagnosis not present

## 2014-01-24 DIAGNOSIS — Z23 Encounter for immunization: Secondary | ICD-10-CM | POA: Diagnosis not present

## 2014-01-24 DIAGNOSIS — I2510 Atherosclerotic heart disease of native coronary artery without angina pectoris: Secondary | ICD-10-CM | POA: Diagnosis not present

## 2014-01-24 DIAGNOSIS — I714 Abdominal aortic aneurysm, without rupture, unspecified: Secondary | ICD-10-CM | POA: Diagnosis not present

## 2014-01-24 DIAGNOSIS — R5383 Other fatigue: Secondary | ICD-10-CM | POA: Diagnosis not present

## 2014-01-24 LAB — HEPATIC FUNCTION PANEL
ALT: 9 U/L — AB (ref 10–40)
AST: 11 U/L — AB (ref 14–40)
Alkaline Phosphatase: 79 U/L (ref 25–125)

## 2014-01-24 LAB — TSH: TSH: 1.31 u[IU]/mL (ref 0.41–5.90)

## 2014-01-24 LAB — CBC AND DIFFERENTIAL
HEMATOCRIT: 42 % (ref 41–53)
Hemoglobin: 14.4 g/dL (ref 13.5–17.5)
NEUTROS ABS: 5 /uL
PLATELETS: 232 10*3/uL (ref 150–399)
WBC: 8.2 10^3/mL

## 2014-01-24 LAB — LIPID PANEL
CHOLESTEROL: 150 mg/dL (ref 0–200)
HDL: 27 mg/dL — AB (ref 35–70)
LDL CALC: 97 mg/dL
LDL/HDL RATIO: 3.6
Triglycerides: 131 mg/dL (ref 40–160)

## 2014-03-07 DIAGNOSIS — I2510 Atherosclerotic heart disease of native coronary artery without angina pectoris: Secondary | ICD-10-CM | POA: Diagnosis not present

## 2014-03-07 DIAGNOSIS — I10 Essential (primary) hypertension: Secondary | ICD-10-CM | POA: Diagnosis not present

## 2014-03-07 DIAGNOSIS — I714 Abdominal aortic aneurysm, without rupture, unspecified: Secondary | ICD-10-CM | POA: Diagnosis not present

## 2014-03-07 DIAGNOSIS — I739 Peripheral vascular disease, unspecified: Secondary | ICD-10-CM | POA: Diagnosis not present

## 2014-03-14 DIAGNOSIS — E7800 Pure hypercholesterolemia, unspecified: Secondary | ICD-10-CM | POA: Diagnosis not present

## 2014-03-14 DIAGNOSIS — Z23 Encounter for immunization: Secondary | ICD-10-CM | POA: Diagnosis not present

## 2014-03-14 DIAGNOSIS — J449 Chronic obstructive pulmonary disease, unspecified: Secondary | ICD-10-CM | POA: Diagnosis not present

## 2014-03-14 DIAGNOSIS — I2510 Atherosclerotic heart disease of native coronary artery without angina pectoris: Secondary | ICD-10-CM | POA: Diagnosis not present

## 2014-04-01 DIAGNOSIS — H2513 Age-related nuclear cataract, bilateral: Secondary | ICD-10-CM | POA: Diagnosis not present

## 2014-04-14 DIAGNOSIS — H1859 Other hereditary corneal dystrophies: Secondary | ICD-10-CM | POA: Diagnosis not present

## 2014-04-21 DIAGNOSIS — I519 Heart disease, unspecified: Secondary | ICD-10-CM | POA: Diagnosis not present

## 2014-06-07 DIAGNOSIS — I6529 Occlusion and stenosis of unspecified carotid artery: Secondary | ICD-10-CM | POA: Diagnosis not present

## 2014-06-07 DIAGNOSIS — I714 Abdominal aortic aneurysm, without rupture: Secondary | ICD-10-CM | POA: Diagnosis not present

## 2014-07-07 DIAGNOSIS — I255 Ischemic cardiomyopathy: Secondary | ICD-10-CM | POA: Diagnosis not present

## 2014-07-07 DIAGNOSIS — I493 Ventricular premature depolarization: Secondary | ICD-10-CM | POA: Diagnosis not present

## 2014-07-07 DIAGNOSIS — E782 Mixed hyperlipidemia: Secondary | ICD-10-CM | POA: Diagnosis not present

## 2014-07-07 DIAGNOSIS — I10 Essential (primary) hypertension: Secondary | ICD-10-CM | POA: Diagnosis not present

## 2014-07-28 DIAGNOSIS — I519 Heart disease, unspecified: Secondary | ICD-10-CM | POA: Diagnosis not present

## 2014-08-11 DIAGNOSIS — H1859 Other hereditary corneal dystrophies: Secondary | ICD-10-CM | POA: Diagnosis not present

## 2014-09-12 DIAGNOSIS — I255 Ischemic cardiomyopathy: Secondary | ICD-10-CM | POA: Diagnosis not present

## 2014-09-12 DIAGNOSIS — I10 Essential (primary) hypertension: Secondary | ICD-10-CM | POA: Diagnosis not present

## 2014-09-12 DIAGNOSIS — K219 Gastro-esophageal reflux disease without esophagitis: Secondary | ICD-10-CM | POA: Diagnosis not present

## 2014-09-12 DIAGNOSIS — F329 Major depressive disorder, single episode, unspecified: Secondary | ICD-10-CM | POA: Diagnosis not present

## 2014-09-12 LAB — BASIC METABOLIC PANEL
BUN: 30 mg/dL — AB (ref 4–21)
Creatinine: 2.2 mg/dL — AB (ref 0.6–1.3)
GLUCOSE: 100 mg/dL
Potassium: 4.7 mmol/L (ref 3.4–5.3)
SODIUM: 142 mmol/L (ref 137–147)

## 2014-10-14 NOTE — Discharge Summary (Signed)
Dates of Admission and Diagnosis:   Date of Admission 24-Jul-2012    Date of Discharge 25-Jul-2012    Admitting Diagnosis cardiomyopathy/aicd placement    Final Diagnosis cardiomyopathy/aicd placement     Chief Complaint/History of Present Illness Pt with cardiomyopathy now with elective placement of aicd device for primary prevention.   Hospital Course:   Hospital Course Doing well post aicd placement and doing well with no evidence of pneumothorax on cxr and site is clean and dry. Mild discomfort in the pacemaker site.    Condition on Discharge Satisfactory   DISCHARGE INSTRUCTIONS HOME MEDS:  Medication Reconciliation:  Patient's Home Medications at Discharge:     Medication Instructions  coreg 6.25 mg oral tablet  1 tab(s) orally 2 times a day   advair diskus 250 mcg-50 mcg inhalation powder  1 puff(s) inhaled 2 times a day   albuterol  2 puff(s) orally 4-6hrs as needed for shortness of breath   lasix 20 mg oral tablet  1 tab(s) orally 2 times a day   lisinopril 5 mg oral tablet  1 tab(s) orally once a day (in the morning)   pravastatin 40 mg oral tablet  1 tab(s) orally once a day (at bedtime)   aspirin 81 mg oral tablet, dispersible  1 tab(s) orally once a day at hs   spironolactone 25 mg oral tablet  1 tab(s) orally      STOP TAKING THE FOLLOWING MEDICATION(S):    potassium chloride 20 meq oral tablet, extended release: 1 tab(s) orally once a day inam  Physician's Instructions:   Home Health? No    Treatments None    Dressing Care Keep dressing dry    Home Oxygen? No    Diet Low Fat, Low Cholesterol    Activity Limitations No exertional activity  No heavy lifting    Return to Work after follow up visit with MD    Time frame for Follow Up Appointment 1-2 weeks  Dr. Serafina Royals   Electronic Signatures: Teodoro Spray (MD)  (Signed 01-Feb-14 08:59)  Authored: ADMISSION DATE AND DIAGNOSIS, CHIEF COMPLAINT/HPI, HOSPITAL COURSE, [place] [facility], PATIENT INSTRUCTIONS   Last Updated: 01-Feb-14 08:59 by Teodoro Spray (MD)

## 2014-10-14 NOTE — Op Note (Signed)
[name], Phillip Allison MR#:  [id number] DATE OF BIRTH:  1941/05/03  DATE OF PROCEDURE:  07/24/2012  OPERATION PERFORMED:  Implantable cardioverter defibrillator implantation (Medtronic).  PRE-PROCEDURE DIAGNOSES:  1.  Ischemic cardiomyopathy (left ventricular ejection fraction equal 20%). 2.  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 3.  Baseline PR interval equals 155 milliseconds, QRS duration equals 115 milliseconds. 4.  Paroxysmal atrial fibrillation  5.  Chronic obstructive pulmonary disease.  POST-PROCEDURE DIAGNOSES: 1.  Ischemic cardiomyopathy (left ventricular ejection fraction equal 20%). 2.  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 3.  Baseline PR interval equals 155 milliseconds, QRS duration equals 115 milliseconds. 4.  Paroxysmal atrial fibrillation  5.  Chronic obstructive pulmonary disease. 6.  Left upper extremity venogram demonstrating a high crossover point of the axillary vein over the first rib; however, no focal stenosis or tortuosity to prevent implantable cardioverter defibrillator lead placement. 7.  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  PRIMARY OPERATOR:  Fatima Sanger, MD   COMPLICATIONS:  None.  PROCEDURE:   1.  Implantable cardioverter defibrillator implant (dual) AV. 2.  Contrast injection. 3.  Extremity-unilateral-venography. 4.  Fluoroscopy.  SEDATION:  Monitored anesthesia care.  LOCAL ANESTHETICS:  1% lidocaine/0.25% bupivacaine, 30 mL.  COMPLICATIONS:  None.  ESTIMATED BLOOD LOSS:  Minimal.  INDICATIONS AND CONSENT:  The patient was evaluated in the electrophysiology consultation clinic for consideration of ICD implantation (MADIT2 criteria with LV EF equals 20% as chronic ischemic cardiomyopathy on a stable, optimal medication  regimen, history of atrial arrhythmias as indication for a dual-chamber implantable cardioverter defibrillator device to assist with management, planning, assessment atrial arrhythmia burden).  Following discussion of risks, benefits, and alternatives with risks including but not limited to bleeding, infection, viscous perforation requiring urgent surgery, heart attack, stroke, death, device malfunction, thromboembolic phenomenon, decision made in favor of ICD implantation.  DESCRIPTION OF PROCEDURE:  The patient was brought to the operating room (OR #3) in a fasting and non-sedated state.  Timeout was performed.  Appropriate resuscitative equipment was attached.  The left infraclavicular region was prepped with chlorhexidine followed by ChloraPrep and draped in the usual sterile manner with Ioban over the surgical site. An incision was made in the left infraclavicular region and extended to the pre-pectoralis fascia with a subcutaneous pocket fashioned inferiorly and medially to accommodate ICD hardware.  A bacitracin-soaked radiopaque gauze was placed in the pocket.  The central venous access was initially attempted based on fluoroscopic landmarks alone; however, this was not successful.  Therefore, a left upper extremity venogram was performed.  A high crossover point was noted and vascular access efforts were redirected utilizing venography information with central venous access obtained and J-tipped guidewires passed below the level of the diaphragm further confirming venous position.  A 9-French SafeSheath was passed over the inferior J-tipped guidewire with dilator and wire removed, sheath aspirated and flushed, and then used to deliver the Medtronic model [id number] ICD lead to the inferior right atrium using a curved purple stylet. The lead was then passed across the tricuspid valve and into the right ventricular outflow tract.  This stylet was then exchanged for a gray stylet with a slight curve at the tip.   The lead was withdrawn and advanced towards the right ventricular apex with counterclockwise torque to direct the lead tip towards the intraventricular septum at the level  of the apex.  The fixation screw was deployed. Adequate lead slack was applied and confirmed with deep inspiration.  R waves were sensed at 14 millivolts with slew rate greater than 4 volts per second and a threshold of 0.5 volts at 0.4 milliseconds.  There was no stimulation in diaphragm with 10 volts of output.  The lead was affixed to the pre-pectoralis fascia following confirmation of adequate lead slack in both AP and LAO projection.  There were two 0 silk ties over the sewing ring and then lead was tested with gentle traction to confirm stability.  Then reconfirmation of stable lead position over the more superior J-tipped guidewire. A 7-French SafeSheath was passed. The dilator and wire were removed from the body.  The sheath was aspirated and flushed and then used to deliver the Medtronic model [id number] lead to the inferior right atrium using a blue J stylet.  The lead tip was placed in the region of the right atrial appendage.  Fixation screw deployed.  Adequate lead slack applied and confirmed with deep inspiration. There was no stimulation in the diaphragm with 10 volts of output.  P waves were sensed at 4 millivolts, slew rates at 1.4 volts per second and pacing threshold of 1 volt at 0.4 milliseconds.  The lead was affixed to the pre-pectoralis fascia with two separate 0 silk ties and tested with gentle traction to confirm the stability.  Bacitracin soaked radiopaque gauze was then removed from the pocket.  The pocket was flushed with copious amounts of bacitracin.  Hemostasis was confirmed to be excellent.  The ICD pacing lead was connected to the Aristes DR ICD generator with visual confirmation of the pins completely through the pin blocks, set screws tightened, leads tested with gentle traction and reconfirmation of pins  completely through the pin block.  The leads were then carefully coiled around the posterior aspect of the device and placed in the pocket with the header facing medially.  The incision was closed with running layers of 2-0 and 3-0 Vicryl, 4-0 Vicryl was used for the subcuticular layers.  Steri-Strips and sterile dressing were applied.  Regular gauze and Tegaderm was placed over the Steri-Strips.  No pressure dressing was applied as hemostasis was excellent.  The system was viewed under fluoroscopy with saved images to document lead position and pocket appearance post ICD implantation stable lung fields and stable vital signs throughout.  SUMMARY OF IMPLANTED HARDWARE:  ICD generator is Medtronic Protecta DR model E1715767, serial [id number] with right ventricular lead model J2437071, serial # [id number] and right atrial lead model N2397891, serial # [id number].  SUMMARY OF PROGRAMMED PARAMETERS:  Brady pacing is AAI with DDD backup lower rate of 60, upper tracking rate of 130, paced AV delay 180, sensitivity delay 150.  Programmed output 3.5 volts at 0.4 milliseconds in both leads with sensitivity programmed to 0.3 millivolts in a bipolar configuration in both leads.  Tachy therapies are programmed as a single zone device detecting ventricular fibrillation for rates greater than 200 beats per minute requiring 30 out of 40 for initial detection and 12 out of 16 for redetection.  Ventricular fibrillation treated with ATP during charging following by 35 joule shocks x 6 there at the VT monitor zone from 167 to 200 beats per minute (monitor only).    The patient will be hospitalized overnight, following on telemetry with left arm restrictions, instructions not to raise the left arm above the level of the shoulder, no heavy lifting or pulling  with the left arm over the next month.  The dressing may be removed in the morning.  Steri-Strips will be left in place for up to 2 weeks or when the strips fall off, whichever  comes first.  The patient will report for a wound check with Dr. Nehemiah Massed in 2 weeks and then device check q.3 to 4 months with Dr. Nehemiah Massed thereafter.  Both Dr. Nehemiah Massed and the patient have our office contact information should we be of any assistance in the future.    ____________________________ Arliss Journey Jabarie Pop, MD ddh:es D: 07/24/2012 15:37:03 ET T: 07/25/2012 15:14:01 ET JOB#: [id number]  cc: Arliss Journey. Colbi Staubs, MD, <Dictator> Corey Skains, MD March Rummage MD ELECTRONICALLY SIGNED 10/02/2012 14:47

## 2014-10-16 NOTE — Op Note (Signed)
[name], Phillip Allison MR#:  [id number] DATE OF BIRTH:  1941-01-28  DATE OF PROCEDURE:  12/18/2011  PREOPERATIVE DIAGNOSES:  1. Abdominal aortic aneurysm.  2. Congestive heart failure.  3. Chronic obstructive pulmonary disease.  4. Hypertension.   POSTOPERATIVE DIAGNOSES:  1. Abdominal aortic aneurysm.  2. Congestive heart failure.  3. Chronic obstructive pulmonary disease.  4. Hypertension.   PROCEDURES:  1. Ultrasound guidance of left radial artery.  2. Placement of left radial arterial line.   SURGEON: Algernon Huxley, MD  ANESTHESIA: None.   ESTIMATED BLOOD LOSS: Minimal.   INDICATION FOR PROCEDURE: This is a 74 year old white male who is about to undergo an abdominal aortic aneurysm repair. Invasive hemodynamic monitoring is desired by our anesthesia colleagues and they were unable to place an arterial line. The patient has multiple comorbidities as described above.   DESCRIPTION OF PROCEDURE: The patient's left radial artery was visualized with ultrasound and found to be patent. It was then accessed under direct ultrasound guidance without difficulty with the radial arterial line and this was placed over a wire with good pulsatile flow through the arterial line. Sterile dressing was placed.     The patient tolerated procedure well.   ____________________________ Algernon Huxley, MD jsd:cms D: 12/18/2011 10:05:42 ET T: 12/18/2011 12:33:35 ET JOB#: [id number]  cc: Algernon Huxley, MD, <Dictator>  Algernon Huxley MD ELECTRONICALLY SIGNED 12/19/2011 9:24

## 2014-10-16 NOTE — Op Note (Signed)
[name], Phillip Allison MR#:  [id number] DATE OF BIRTH:  08/23/1940  DATE OF PROCEDURE:  12/18/2011  PREOPERATIVE DIAGNOSES:  1. Abdominal aortic aneurysm.  2. Congestive heart failure.  3. Chronic obstructive pulmonary disease.  4. Hypertension.  5. Hyperlipidemia.   POSTOPERATIVE DIAGNOSES:  1. Abdominal aortic aneurysm.  2. Congestive heart failure.  3. Chronic obstructive pulmonary disease.  4. Hypertension.  5. Hyperlipidemia.   PROCEDURES PERFORMED: 1. Bilateral femoral artery cutdowns for placement of endoprosthesis, right by Dr. Lucky Cowboy and left by Dr. Delana Meyer.  2. Catheter placement into aorta from bilateral femoral approaches, right by Dr. Lucky Cowboy and left by Dr. Delana Meyer.  3. Placement of Gore Excluder endoprosthesis, main body right, 28 mm proximal and 14 mm distal with a 12 mm x 12 cm length contralateral limb.  4. Placement of right iliac extender, 14 mm diameter, by Dr. Lucky Cowboy.  SURGEON: Leotis Pain, MD  CO-SURGEON: Hortencia Pilar, MD  ANESTHESIA: General.   ESTIMATED BLOOD LOSS: Approximately 50 mL.  FLUOROSCOPY TIME: 18 minutes.   CONTRAST USED: 65 mL.   INDICATION FOR PROCEDURE: This is a 73 year old white male with abdominal aortic aneurysm which has now grown to greater than 5 cm in maximal diameter. He has appropriate anatomy for endovascular repair. The risks and benefits were discussed and informed consent was obtained.   DESCRIPTION OF PROCEDURE: The patient was brought to the vascular interventional suite with the help of our anesthesia colleagues providing a general anesthetic. His abdomen and groins were sterilely prepped and draped and a sterile surgical field was created. Vertical incisions were created overlying both femoral arteries. I dissected out the right femoral artery and Dr. Delana Meyer dissected out the left, and the vessels were encircled with vessel loops proximally and distally. 6000 units of intravenous heparin was given for systemic  anticoagulation. Control was pulled up on the vessel loops and 8 French sheaths were placed bilaterally. I placed a pigtail catheter up the right and an AP aortogram was performed. This showed an abdominal aortic aneurysm with a configuration we expected from the CT scan. 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 I placed an 10 French sheath up the right over a stiff wire and the main device, which was a 28 mm x 14 cm in length Gore Excluder C3 endoprosthesis from the right and Dr. Delana Meyer put a Kumpe catheter up the left and magnified views were performed with LAO cranial projections to evaluate our proximal neck. This was deployed. Initially we were not pleased with the placement and reconstrained the device and redeployed it in excellent location, approximately 1 cm below the renal artery on the left. Dr. Delana Meyer cannulated the contralateral gate without difficulty. We confirmed this by twirling the pigtail catheter in the main body and placed a stiff wire and a 12 French sheath. He completed the contralateral limb with a 12 mm diameter x 12 cm in length limb. The Coda balloon was used to iron out all junction points and seal zones and proximal endpoint. I completed the ipsi side. hips. There was still about 3 to 4 cm of iliac artery above the hypogastric artery with ulcers and irregularity in this area that required coverage so an iliac extender was taken down to less than 1 cm above the right hypogastric  artery. This was 14 mm diameter from my side. I ironed out my junction points and seal zones with the [place] balloon as well. A pigtail catheter was replaced and completion angiogram showed excellent location of the endoprosthesis with no type I, II, or III endoleak seen and good flow with patent left renal artery and  hypogastric arteries. At this point, we elected to terminate the procedure. Dr. Delana Meyer removed his sheath and closed his groin in a similar fashion to mine, which were interrupted Prolene sutures on the femoral arteriotomy, two     layers of 2-0 Vicryl, two layers of 3-0 Vicryl, and a 4-0 Monocryl. Dermabond was placed as a dressing. The patient tolerated the procedure well and was taken to the recovery room in stable condition.   ____________________________ Algernon Huxley, MD jsd:slb D: 12/18/2011 10:04:13 ET T: 12/18/2011 12:34:51 ET JOB#: [id number]  cc: Algernon Huxley, MD, <Dictator> Richard L. Rosanna Randy, MD Corey Skains, MD Algernon Huxley MD ELECTRONICALLY SIGNED 12/19/2011 9:24

## 2014-10-16 NOTE — Consult Note (Signed)
General Aspect 74 year old male presenting to the ED with hypertensive crisis, shortness of breath, and lower extreme edema.  Patient's blood pressure was over 222 systolic.  He was in respiratory distress.  He had baseline troponin of 0.31.  He was started on BiPAP, nitroglycerin drip for blood pressure issues and antibiotics with a WBC of over 20.  He is now much improved.  His last troponin was 1.20, but is thought to be due to demand ischemia from acute hypoxia, not acute coronary syndrome..  Patient does have over 50 year smoking history , and when he is decompensated in the past, there's been some form of upper respiratory infection as well.He has not been seen in cardiology since May of 2011.  He does have known CAD.  His EF by last echo at the clinic was 40%.  His EF now is 20%.  Lovenox and Plavix was added due to positive troponins.  Patient has also had antral fibrillation in the past witch  was also triggered by acute respiratory infection.  He currently is maintaining normal sinus rhythm.  His EKG does not show any acute changes. He does say that he had a few minutes of left upper chest/ shoulder discomfort this morning, but it was brief and has not reoccurred   Physical Exam:   GEN WD, NAD    HEENT pink conjunctivae    RESP normal resp effort  crackles  left base    CARD Regular rate and rhythm  No murmur    ABD denies tenderness  normal BS    EXTR negative edema    SKIN skin turgor decreased    NEURO cranial nerves intact, motor/sensory function intact    PSYCH A+O to time, place, person, sedated   Review of Systems:   Subjective/Chief Complaint Dyspnea, cough    Respiratory: Frequent cough  Short of breath  improved    Cardiovascular: Chest pain or discomfort  Dyspnea  few minutes this am    Review of Systems: All other systems were reviewed and found to be negative    Medications/Allergies Reviewed Medications/Allergies reviewed   Routine Chem:  18-Feb-13 23:28     Glucose, Serum 188   BUN 18   Creatinine (comp) 1.57   Sodium, Serum 143   Potassium, Serum 3.8   Chloride, Serum 105   CO2, Serum 28   Calcium (Total), Serum 9.0  Hepatic:  18-Feb-13 23:28    Bilirubin, Total 0.4   Alkaline Phosphatase 90   SGPT (ALT) 20   SGOT (AST) 20   Total Protein, Serum 7.9   Albumin, Serum 3.9  Routine Chem:  18-Feb-13 23:28    Osmolality (calc) 292   eGFR (African American) 57   eGFR (Non-African American) 47   Anion Gap 10  Routine Hem:  18-Feb-13 23:28    WBC (CBC) 24.8   RBC (CBC) 5.35   Hemoglobin (CBC) 16.2   Hematocrit (CBC) 48.7   Platelet Count (CBC) 323   MCV 91   MCH 30.2   MCHC 33.2   RDW 14.6  Cardiac:  18-Feb-13 23:28    Troponin I 0.31  Routine Chem:  18-Feb-13 23:28    B-Type Natriuretic Peptide [facility]) [id number]  Blood Glucose:  19-Feb-13 03:24    POCT Blood Glucose 119  Cardiac:  19-Feb-13 07:26    Troponin I 1.20    No Known Allergies:   Vital Signs/Nurse's Notes: **Vital Signs.:   19-Feb-13 12:00   Pulse Pulse 78   Respirations  Respirations 19   Systolic BP Systolic BP 188   Diastolic BP (mmHg) Diastolic BP (mmHg) 50   Mean BP 75   Pulse Ox % Pulse Ox % 94   Pulse Ox Activity Level  At rest   Oxygen Delivery 2L; Nasal Cannula   Pulse Ox Heart Rate 50     Impression 74 year old male with history of tobacco abuse, COPD, renal insufficiency, CAD, left ventricle dysfunction with malignant hypertension, acute on chronic congestive heart failure, elevated troponin thought to be secondary to hypoxia/ not acute coronary syndrome, currently improved.    Plan Discussed with Dr. Nehemiah Massed, who agrees with the above impression and states that patient should continue his Lovenox through tomorrow and then be discontinued if stays stable.  Patient appears much improved and can be transferred to the floor from a cardiology standpoint if hospitalist is in agreement. He will need to be followed up on an outpatient basis due to   change in EF,now at 20%, optimization of medications and discussion for need of ICD.   Electronic Signatures: Roderic Palau (NP)  (Signed 19-Feb-13 13:27)  Authored: General Aspect/Present Illness, History and Physical Exam, Review of System, Labs, Allergies, Vital Signs/Nurse's Notes, Impression/Plan   Last Updated: 19-Feb-13 13:27 by Roderic Palau (NP)

## 2014-10-16 NOTE — H&P (Signed)
[name], Phillip Allison MR#:  [id number] DATE OF BIRTH:  [birth year]  DATE OF ADMISSION:  08/13/2011  PRIMARY CARE PHYSICIAN: Dr. Miguel Aschoff.  REFERRING PHYSICIAN: Dr. Marjean Donna.   CHIEF COMPLAINT: Shortness of breath and cough since last night.   HISTORY OF PRESENT ILLNESS: 74 year old Caucasian male with a history of hypertension, myocardial infarction, coronary artery disease, congestive heart failure, abdominal aortic aneurysm, chronic obstructive pulmonary disease, right kidney disease presented to the ED with the above chief complaint. The patient is alert, awake, oriented, in no acute distress. The patient said he started to have shortness of breath with a lot of cough since about 10:00 p.m. last night. The patient said he had similar symptoms a couple days ago but it is worse at this time. The patient denies any fever or chills. No chest pain, palpitations, orthopnea, or nocturnal dyspnea. No leg edema, but according to the patient's wife, he can only walk about ten meters without shortness of breath.   <<MISSING TEXT>> Dictation stopped.   ____________________________ Demetrios Loll, MD qc:ap D: 08/13/2011 02:05:41 ET T: 08/13/2011 07:04:10 ET JOB#: [id number]  cc: Demetrios Loll, MD, <Dictator>

## 2014-10-16 NOTE — Discharge Summary (Signed)
[name], HARBEN MR#:  [id number] DATE OF BIRTH:  [birth year]  DATE OF ADMISSION:  08/13/2011 DATE OF DISCHARGE:  08/14/2011  For a detailed note, please see the History and Physical done on admission by Dr. Bridgett Larsson.   DIAGNOSES AT DISCHARGE:  1. Acute respiratory failure secondary to a combination of chronic obstructive pulmonary disease and congestive heart failure.  2. Congestive heart failure, acute on chronic systolic dysfunction.  3. Ischemic cardiomyopathy with ejection fraction of 20%.  4. Chronic obstructive pulmonary disease.  5. Hyperlipidemia.  6. Chronic kidney disease, stage III.   DIET: The patient is being discharged on a low-sodium diet.   ACTIVITY: As tolerated.   FOLLOWUP:  Follow up with Dr. Rosanna Randy and Dr. Nehemiah Massed in the next 1 to 2 weeks.     DISCHARGE MEDICATIONS:  1. Simvastatin 80 mg daily.  2. Aspirin 81 mg daily.  3. Coreg 6.25 mg b.i.d.  4. Lisinopril 5 mg daily.  5. Advair 250/50 1 puff b.i.d.  6. Spiriva 1 puff daily.  7. Albuterol inhaler 2 puffs q. 4-6 h. p.r.n. shortness of breath.  8. Levaquin 500 mg daily times five days.  9. Lasix 20 mg b.i.d.  10. Potassium 20 mEq daily.   CONSULTANTS: Dr. Serafina Royals from cardiology.    PERTINENT STUDIES:  1. Chest x-ray done on admission showed interstitial infiltrate likely presenting pulmonary edema.  2. Two-dimensional echocardiogram showed severe global hypokinesis of the left ventricle, ejection fraction of 20%, left ventricle moderately dilated. Moderate mitral regurgitation and moderate tricuspid regurgitation.   HOSPITAL COURSE: This is a 74 year old male with medical problems as mentioned above who presented to the hospital with shortness of breath and acute respiratory failure.  1. Acute respiratory failure: Initially the patient presented to the hospital quite hypoxic and thought to be in respiratory failure secondary to congestive heart failure given elevated troponins and  chest x-ray findings consistent with pulmonary edema. The patient was only intermittently placed on BiPAP, started on aggressive IV diuretics, and started on some beta blockers. The patient diuresed fairly well with Lasix and was clinically feeling much better a day after admission. Some element of his shortness of breath was also related to chronic obstructive pulmonary disease as he has a long time history of smoking and he was still smoking. The patient was not taking any inhalers prior to coming in, therefore was placed on some Advair and Spiriva upon discharge.  2. Congestive heart failure: This is likely acute on chronic systolic dysfunction. The patient has underlying cardiomyopathy, ejection fraction of 20%. This is chronic cardiomyopathy for the patient as per cardiology the patient did have a mild troponin elevation, but as per cardiology this was thought to be related to demand ischemia. The patient actually was on just Cardizem prior to coming in to the hospital; therefore, he was changed to p.o. Coreg along with a low-dose ACE inhibitor for treatment of his congestive heart failure, along with some diuretics and potassium supplements as stated above. The patient will have close followup with cardiology as an outpatient.  3. Chronic kidney disease, stage III: The patient's creatinine remained stable despite being on a low-dose ACE. He currently is being discharged on an ACE inhibitor given his heart failure and his renal function needs to be followed further as an outpatient by his primary care physician and also his cardiologist.  4. Chronic obstructive pulmonary disease: As mentioned, the patient has a longtime history of tobacco abuse. He was not on  any inhalers prior to coming in. He was started on some Advair and Spiriva and discharged on those and also a small dose of Levaquin for a few days for mild chronic obstructive pulmonary disease exacerbation. He was not bronchospastic. He was ambulated  on room air and did desaturate to below 88% and qualified for home oxygen which was arranged for him upon discharge.  5. Hyperlipidemia: The patient was maintained on simvastatin. He will continue that.    The patient was discharged home on home oxygen and [place] services. Family and the patient were in agreement with this plan.   TIME SPENT: 40 minutes.    ____________________________ Belia Heman. Verdell Carmine, MD vjs:bjt D: 08/14/2011 15:49:07 ET T: 08/15/2011 10:25:43 ET JOB#: [id number]  cc: Belia Heman. Verdell Carmine, MD, <Dictator> Richard L. Rosanna Randy, MD Corey Skains, MD Henreitta Leber MD ELECTRONICALLY SIGNED 08/16/2011 16:11

## 2014-10-16 NOTE — H&P (Signed)
[name], Phillip Allison MR#:  [id number] DATE OF BIRTH:  05-Nov-1940  DATE OF ADMISSION:  08/13/2011  PRIMARY CARE PHYSICIAN: Dr. Miguel Aschoff.   REFERRING PHYSICIAN: Dr. Marjean Donna.   CHIEF COMPLAINT: Shortness of breath and cough since last night.   HISTORY OF PRESENT ILLNESS: 74 year old Caucasian male with a history of hypertension, myocardial infarction, coronary artery disease, congestive heart failure, abdominal aortic aneurysm, chronic obstructive pulmonary disease, right kidney disease, who presented to the ED with the above chief complaints. The patient is alert, awake, oriented in no acute distress. The patient stated that he has shortness of breath with a lot of cough since 10:00 last night. The patient had similar symptoms a couple of days ago, but it is worse at this time. The patient denies any other symptoms. He denies any chest pain, palpitation, or orthopnea. No nocturnal dyspnea. No leg edema. No hematemesis. No fever or chills. The patient's blood pressure was 238/133, BNP is more than 9,000. He was treated with nitroglycerin drip and Lasix and placed on BiPAP.   PAST MEDICAL HISTORY: As mentioned above.   PAST SURGICAL HISTORY: None.  SOCIAL HISTORY: He smokes one-half of pack a day to 1 pack a day for more than 50 years. He denies any alcohol drinking or illicit drugs.   FAMILY HISTORY: Father had a heart attack. Mother had heart disease.   ALLERGIES: No known drug allergies.   MEDICATIONS:  1. Zocor 80 mg p.o. daily.  2. Cardizem CD 240 mg p.o. daily.  3. Aspirin 81 mg p.o. daily   REVIEW OF SYSTEMS: CONSTITUTIONAL: The patient denies any fever or chills. No headache or dizziness. No weight loss or weight gain. EYES: No blurred vision or double vision. No glaucoma or cataracts. ENT: No ear pain, but has mild hearing loss. No epistaxis. No postnasal drip. RESPIRATORY: Positive for cough, shortness of breath, chronic obstructive pulmonary disease, but  no wheezing, or hemoptysis. CARDIOVASCULAR: No chest pain, palpitation, orthopnea or nocturnal dyspnea. No leg edema. GASTROINTESTINAL: No abdominal pain, nausea, vomiting, or diarrhea. No melena or bloody stool. No jaundice. GU: No dysuria or hematuria. No incontinence. SKIN: No rash or jaundice. ENDOCRINE: No cold intolerance or heat intolerance. No polyuria or polydipsia. HEMATOLOGIC: No anemia, easy bruising or bleeding. NEUROLOGIC: No syncope or seizure. PSYCHIATRIC: No depression. No anxiety.   PHYSICAL EXAMINATION:  VITAL SIGNS: Blood pressure 238/133, now decreased to 150/85, pulse 96, oxygen saturation 91% on oxygen by nasal cannula.   GENERAL: The patient is alert, awake, oriented, in no acute distress.   HEENT: Pupils round, equal, reactive to light and accommodation. Moist oral mucosa. Clear oropharynx.   NECK: Supple. No JVD or carotid bruit. No lymphadenopathy. No thyromegaly.   CARDIOVASCULAR: S1 and S2, regular rate and rhythm. No murmurs or gallops.   PULMONARY: Bilateral air entry. No wheezing or rales. No use of accessory muscles to breathe. The patient has very weak breath sounds, but no crackles, rales or wheezing.   ABDOMEN: Soft. No distention or tenderness. No organomegaly. Bowel sounds present.   EXTREMITIES: No edema, clubbing, or cyanosis. No calf tenderness. Strong bilateral pedal pulses.   SKIN: No rash or jaundice.   NEUROLOGIC: Alert and oriented x3. No focal deficit. Power five out of five. Sensation intact. Deep tendon reflexes 2+.   LABORATORY, RADIOLOGICAL AND DIAGNOSTIC DATA: Glucose 188, BUN 18, creatinine 1.57, sodium 143, potassium 3.8, chloride 105, bicarbonate 28, WBC 24.8, hemoglobin 16.2, platelet 323, troponin 0.31. BNP 9,915. Chest x-rays  show bilateral congestion, but no definite infiltrate.   IMPRESSION:  1. Hypertensive emergency.  2. Congestive heart failure decompensation.  3. Elevated troponin level possibly due to hypertensive emergency  or congestive heart failure decompensation, but need to rule out ACS.  4. Leukocytosis. No source of infection so far, possibly due to reaction.  5. Possible acute renal failure or CKD.  6. Chronic obstructive pulmonary disease.  7. History of abdominal aortic aneurysm.   PLAN OF TREATMENT:  1. The patient will be admitted to Critical Care Unit.  2. Will continue telemetry monitor.  3. Closely monitor vital signs.  4. We will give oxygen by nasal cannula and BiPAP p.r.n.  5. We will start congestive heart failure protocol, daily weight input and output measurement.  6. Fluid restriction.  7. We will continue nitroglycerin drip, start Coreg, lisinopril and Lasix.  8. Follow-up echocardiogram and cardiology consult.  9. For elevated troponin, we will increase aspirin to 325 mg p.o. daily and start Plavix 75 mg p.o. daily, Lovenox 1 mg/kg q.12 hours subcutaneous. In addition, continue Zocor and follow up troponin level.  10. For leukocytosis, there is no definite source of infection. We will follow up CBC, urinalysis, CRP and give Zithromax and Rocephin.  11. For chronic obstructive pulmonary disease, we will give nebulizer and Robitussin p.r.n.  12. Follow-up BMP and BNP.  13. Gastrointestinal prophylaxis.   Discussed the patient's critical situation and the plan of treatment with the patient and the patient's wife.      TIME SPENT: About 80 minutes.   ____________________________ Demetrios Loll, MD qc:ap D: 08/13/2011 02:05:38 ET T: 08/13/2011 07:13:02 ET JOB#: [id number]  cc: Demetrios Loll, MD, <Dictator> Richard L. Rosanna Randy, MD  Demetrios Loll MD ELECTRONICALLY SIGNED 08/14/2011 21:07

## 2014-10-16 NOTE — Op Note (Signed)
[name], Phillip Allison MR#:  [id number] DATE OF BIRTH:  Feb 12, 1941  DATE OF PROCEDURE:  12/18/2011  PREOPERATIVE DIAGNOSIS: Abdominal aortic aneurysm.   POSTOPERATIVE DIAGNOSIS: Abdominal aortic aneurysm.   PROCEDURES PERFORMED:  1. Endovascular repair of abdominal aortic aneurysm.  2. Introduction catheter into aorta bilateral femoral artery approach.  3. Bilateral femoral artery cutdowns for open exposure.   PROCEDURE PERFORMED BY: Katha Cabal, MD and Algernon Huxley, MD, co-surgeon  ANESTHESIA: General by endotracheal intubation.   FLUIDS: Per anesthesia record.   ESTIMATED BLOOD LOSS: Minimal.   SPECIMEN: None.   INDICATIONS: Phillip Allison is a 74 year old gentleman who was referred to the office for evaluation of abdominal aortic aneurysm and found to have an infrarenal abdominal aortic aneurysm greater than 5 cm. He is, therefore, undergoing endovascular repair. Risks, benefits, as well as alternative therapies were reviewed. All questions have been answered. The patient agrees to proceed.   PROCEDURE: The patient is taken to the Special Procedure Suite and placed in the supine position. After adequate general anesthesia is induced and appropriate invasive monitors are placed, he is prepped and draped from the nipple line to the knees.   With Dr. Lucky Cowboy working on the right and myself on the left, simultaneous incisions are created in the groins and carried down through the soft tissues to expose the femoral sheath. Femoral sheath is opened and the common femoral artery is looped with Silastic vessel loops.   6000 units of heparin is given. Seldinger needle was then inserted into the anterior wall of the common femoral arteries and J-wire advanced up both sides. Pigtail catheter is then advanced up the right, KMP catheter up the left after an 8 French sheath is inserted. With the pigtail catheter positioned at approximately T12, bolus injection of contrast is used  to demonstrate the location of the renals and finalize length measurements. After review of the image, a 28 x 14 x 14 Gore Excluder main body device is opened and prepped on the field and Lunderquist stiff wire  is then advanced up the right side through the pigtail catheter. Pigtail catheter and 8 French sheath were removed and an 18 French dry seal sheath inserted. It is then advanced up to the level of the renals and the 28 x 14 x 14 main body is advanced. Sheath is then withdrawn and the main body device is flowered out. Follow-up angiography demonstrates the device is sitting at a very oblique angle slightly higher than the narrowing. This is then angioplastied with Coda balloon after the contralateral gate was engaged by myself from the left approach with the KMP catheter and the Glidewire. Follow-up angiography demonstrated persistence of the angle and, therefore, the C3 device is reconstrained, repositioned slightly, and then re-flowered. On follow-up imaging it is now flush with the proximal edge of the stenosis and no longer has a bird beak obliquity to it and, therefore, measurements are made from the left side to localize the iliac bifurcation. After review of these images, a 12 x 12 contralateral limb is opened onto the field, prepped. The stiff wire was advanced from the left side, 8 French sheath was removed and a 12 [place] dry seal sheath placed. The contralateral limb was then advanced through the dry seal sheath, positioned within the main body, and then deployed without difficulty. The Coda balloon is then used to seal the proximal as well as the limb. Further measurement demonstrates a rather lengthy segment on the left and, therefore,  a 14 x 7 extender is used on the left again deployed through the 12 French dry seal extending the endograft down to the iliac bifurcation. It is postdilated with the [facility] balloon.   Pigtail catheter is then advanced up the right side. Bolus injection of contrast  with delayed image is obtained. No evidence of endoleak is noted and, therefore, both sheaths and wires are removed. Vascular control is obtained in both groins and both common femoral arteriotomies are repaired with interrupted 7-0 Prolenes. Surgicel was placed after irrigation of the wounds and then wounds are closed in multiple layers using 2-0 and 3-0 Vicryl followed by 4-0 Monocryl subcuticular and Dermabond. The patient tolerated the procedure well and there were no immediate complications. He is taken to the recovery area in stable condition.  ____________________________ Katha Cabal, MD ggs:drc D: 12/19/2011 19:25:18 ET T: 12/20/2011 09:47:11 ET JOB#: [id number]  cc: Katha Cabal, MD, <Dictator> Katha Cabal MD ELECTRONICALLY SIGNED 12/21/2011 12:52

## 2014-10-17 DIAGNOSIS — I10 Essential (primary) hypertension: Secondary | ICD-10-CM | POA: Insufficient documentation

## 2014-10-25 DIAGNOSIS — I519 Heart disease, unspecified: Secondary | ICD-10-CM | POA: Diagnosis not present

## 2014-11-01 DIAGNOSIS — I714 Abdominal aortic aneurysm, without rupture: Secondary | ICD-10-CM | POA: Diagnosis not present

## 2014-11-01 DIAGNOSIS — I5022 Chronic systolic (congestive) heart failure: Secondary | ICD-10-CM | POA: Diagnosis not present

## 2014-11-01 DIAGNOSIS — I340 Nonrheumatic mitral (valve) insufficiency: Secondary | ICD-10-CM | POA: Insufficient documentation

## 2014-11-01 DIAGNOSIS — I255 Ischemic cardiomyopathy: Secondary | ICD-10-CM | POA: Diagnosis not present

## 2014-12-20 DIAGNOSIS — I714 Abdominal aortic aneurysm, without rupture: Secondary | ICD-10-CM | POA: Diagnosis not present

## 2014-12-20 DIAGNOSIS — I6529 Occlusion and stenosis of unspecified carotid artery: Secondary | ICD-10-CM | POA: Diagnosis not present

## 2015-01-24 DIAGNOSIS — I519 Heart disease, unspecified: Secondary | ICD-10-CM | POA: Diagnosis not present

## 2015-01-25 DIAGNOSIS — Z95810 Presence of automatic (implantable) cardiac defibrillator: Secondary | ICD-10-CM | POA: Insufficient documentation

## 2015-02-14 ENCOUNTER — Other Ambulatory Visit: Payer: Self-pay | Admitting: Family Medicine

## 2015-02-14 NOTE — Telephone Encounter (Signed)
Fax from [facility], pt has appt for fu in September. Please review./-aa

## 2015-03-10 DIAGNOSIS — I2510 Atherosclerotic heart disease of native coronary artery without angina pectoris: Secondary | ICD-10-CM | POA: Insufficient documentation

## 2015-03-10 DIAGNOSIS — F17200 Nicotine dependence, unspecified, uncomplicated: Secondary | ICD-10-CM | POA: Insufficient documentation

## 2015-03-10 DIAGNOSIS — I714 Abdominal aortic aneurysm, without rupture, unspecified: Secondary | ICD-10-CM | POA: Insufficient documentation

## 2015-03-10 DIAGNOSIS — E7800 Pure hypercholesterolemia, unspecified: Secondary | ICD-10-CM | POA: Insufficient documentation

## 2015-03-10 DIAGNOSIS — N529 Male erectile dysfunction, unspecified: Secondary | ICD-10-CM | POA: Insufficient documentation

## 2015-03-10 DIAGNOSIS — J449 Chronic obstructive pulmonary disease, unspecified: Secondary | ICD-10-CM | POA: Insufficient documentation

## 2015-03-10 DIAGNOSIS — N183 Chronic kidney disease, stage 3 unspecified: Secondary | ICD-10-CM | POA: Insufficient documentation

## 2015-03-10 DIAGNOSIS — I779 Disorder of arteries and arterioles, unspecified: Secondary | ICD-10-CM | POA: Insufficient documentation

## 2015-03-10 DIAGNOSIS — I255 Ischemic cardiomyopathy: Secondary | ICD-10-CM | POA: Insufficient documentation

## 2015-03-10 DIAGNOSIS — I130 Hypertensive heart and chronic kidney disease with heart failure and stage 1 through stage 4 chronic kidney disease, or unspecified chronic kidney disease: Secondary | ICD-10-CM | POA: Insufficient documentation

## 2015-03-10 DIAGNOSIS — K219 Gastro-esophageal reflux disease without esophagitis: Secondary | ICD-10-CM | POA: Insufficient documentation

## 2015-03-10 DIAGNOSIS — F320 Major depressive disorder, single episode, mild: Secondary | ICD-10-CM | POA: Insufficient documentation

## 2015-03-13 ENCOUNTER — Ambulatory Visit (INDEPENDENT_AMBULATORY_CARE_PROVIDER_SITE_OTHER): Payer: Medicare Other | Admitting: Family Medicine

## 2015-03-13 ENCOUNTER — Encounter: Payer: Self-pay | Admitting: Family Medicine

## 2015-03-13 VITALS — BP 112/64 | HR 72 | Temp 98.2°F | Resp 14 | Wt 183.0 lb

## 2015-03-13 DIAGNOSIS — I454 Nonspecific intraventricular block: Secondary | ICD-10-CM | POA: Insufficient documentation

## 2015-03-13 DIAGNOSIS — I5022 Chronic systolic (congestive) heart failure: Secondary | ICD-10-CM | POA: Insufficient documentation

## 2015-03-13 DIAGNOSIS — Z8679 Personal history of other diseases of the circulatory system: Secondary | ICD-10-CM | POA: Insufficient documentation

## 2015-03-13 DIAGNOSIS — Z23 Encounter for immunization: Secondary | ICD-10-CM

## 2015-03-13 DIAGNOSIS — Z87891 Personal history of nicotine dependence: Secondary | ICD-10-CM | POA: Insufficient documentation

## 2015-03-13 NOTE — Progress Notes (Signed)
Patient ID: EDLEY OH, male   DOB: [birth year], 74 y.o.   MRN: [id number]    Subjective:  HPI  Hypertension follow up: Patient is here for 6 months follow up. He is not checking his B/P anymore. BP Readings from Last 3 Encounters:  03/13/15 112/64  09/12/14 102/58   Hyperlipidemia follow up: Lab Results  Component Value Date   CHOL 150 01/24/2014   HDL 27* 01/24/2014   LDLCALC 97 01/24/2014   TRIG 131 01/24/2014   Depression/anxiety follow up: Patient takes Mirtazapine at bedtime. Right now he is taking of his wife who was diagnosed with Stage 4 urethral carcinoma. He is trying to deal with this. Wt Readings from Last 3 Encounters:  03/13/15 183 lb (83.008 kg)  09/12/14 187 lb (84.823 kg)   Prior to Admission medications   Medication Sig Start Date End Date Taking? Authorizing Provider  aspirin 81 MG tablet Take by mouth. 08/22/11  Yes Historical Provider, MD  carvedilol (COREG) 12.5 MG tablet Take by mouth. 08/22/11  Yes Historical Provider, MD  Fluticasone-Salmeterol (ADVAIR DISKUS) 250-50 MCG/DOSE AEPB Inhale into the lungs. 09/23/14  Yes Historical Provider, MD  Fluticasone-Salmeterol (ADVAIR DISKUS) 250-50 MCG/DOSE AEPB Inhale into the lungs.   Yes Historical Provider, MD  furosemide (LASIX) 20 MG tablet Take by mouth. 08/22/11  Yes Historical Provider, MD  lisinopril (PRINIVIL,ZESTRIL) 5 MG tablet Take by mouth. 08/22/11  Yes Historical Provider, MD  mirtazapine (REMERON) 30 MG tablet TAKE 1 TABLET BY MOUTH ONCE DAILY 02/15/15  Yes Jerrol Banana., MD  pravastatin (PRAVACHOL) 40 MG tablet Take by mouth. 11/08/14  Yes Historical Provider, MD    Patient Active Problem List   Diagnosis Date Noted  . History of tobacco use 03/13/2015  . History of atrial fibrillation 03/13/2015  . Chronic systolic heart failure [id number]  . Intraventricular block 03/13/2015  . Abdominal aneurysm 03/10/2015  . Cardiovascular disease 03/10/2015  . Arteriosclerosis of coronary  artery 03/10/2015  . CAFL (chronic airflow limitation) 03/10/2015  . Chronic kidney disease (CKD), stage III (moderate) 03/10/2015  . Depression, major, single episode, mild 03/10/2015  . 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 Acid reflux 03/10/2015  . Heart & renal disease, hypertensive, with heart failure 03/10/2015  . Cardiomyopathy, ischemic 03/10/2015  . Disorder of arteries and arterioles 03/10/2015  . Hypercholesterolemia without hypertriglyceridemia 03/10/2015  . Current smoker 03/10/2015  . Cardiac defibrillator in place 01/25/2015  . MI (mitral incompetence) 11/01/2014  . Benign essential HTN 10/17/2014    No past medical history on file.  Social History   Social History  . Marital Status: Married    Spouse Name: N/A  . Number of Children: N/A  . Years of Education: N/A   Occupational History  . Not on file.   Social History Main Topics  . Smoking status: Former Smoker -- 0.50 packs/day for 50 years    Types: Cigarettes  . Smokeless tobacco: Never Used  . Alcohol Use: No  . Drug Use: No  . Sexual Activity: Not Currently   Other Topics Concern  . Not on file   Social History Narrative    No Known Allergies  Review of Systems  Constitutional: Negative.   Eyes: Negative.   Respiratory: Positive for cough and shortness of breath.   Cardiovascular: Negative.   Gastrointestinal: Negative.   Musculoskeletal: Negative.   Neurological: Negative for dizziness and headaches.  Psychiatric/Behavioral: Positive for depression. The patient is nervous/anxious. The patient does not have insomnia.  Immunization History  Administered Date(s) Administered  . Influenza-Unspecified 02/22/2014  . Pneumococcal Polysaccharide-23 04/18/2005, 04/23/2012  . Td 09/15/2003   Objective:  BP 112/64 mmHg  Pulse 72  Temp(Src) 98.2 F (36.8 C)  Resp 14  Wt 183 lb (83.008 kg)  Physical Exam  Constitutional: He is oriented to person, place, and time and  well-developed, well-nourished, and in no distress.  HENT:  Head: Normocephalic and atraumatic.  Right Ear: External ear normal.  Left Ear: External ear normal.  Nose: Nose normal.  Eyes: Conjunctivae are normal.  Neck: Neck supple.  Cardiovascular: Normal rate, regular rhythm and normal heart sounds.   Pulmonary/Chest: Effort normal and breath sounds normal.  Abdominal: Soft.  Neurological: He is alert and oriented to person, place, and time.  Skin: Skin is warm and dry.  Psychiatric: Mood, memory, affect and judgment normal.    Lab Results  Component Value Date   WBC 8.2 01/24/2014   HGB 14.4 01/24/2014   HCT 42 01/24/2014   PLT 232 01/24/2014   GLUCOSE 99 07/21/2012   CHOL 150 01/24/2014   TRIG 131 01/24/2014   HDL 27* 01/24/2014   LDLCALC 97 01/24/2014   TSH 1.31 01/24/2014   INR 1.1 07/21/2012   HGBA1C 6.0 08/14/2011    CMP     Component Value Date/Time   NA 142 09/12/2014   NA 138 07/21/2012 1129   K 4.7 09/12/2014   K 4.4 07/21/2012 1129   CL 105 07/21/2012 1129   CO2 27 07/21/2012 1129   GLUCOSE 99 07/21/2012 1129   BUN 30* 09/12/2014   BUN 32* 07/21/2012 1129   CREATININE 2.2* 09/12/2014   CREATININE 1.73* 07/21/2012 1129   CALCIUM 9.0 07/21/2012 1129   PROT 5.8* 12/19/2011 0506   ALBUMIN 2.9* 12/19/2011 0506   AST 11* 01/24/2014   AST 15 12/19/2011 0506   ALT 9* 01/24/2014   ALT 11* 12/19/2011 0506   ALKPHOS 79 01/24/2014   ALKPHOS 76 12/19/2011 0506   BILITOT 0.6 12/19/2011 0506   GFRNONAA 39* 07/21/2012 1129   GFRNONAA 49* 08/14/2011 0608   GFRAA 45* 07/21/2012 1129   GFRAA 59* 08/14/2011 0608    Assessment and Plan :  1. Need for influenza vaccination  - Flu vaccine HIGH DOSE PF (Fluzone High dose)  2. Need for pneumococcal vaccination  - Pneumococcal conjugate vaccine 13-valent 3. CAD All risk factors treated 4. COPD Presently stable. 5. Depression Patient appropriately grieving the recent diagnosis of his wife with end stage   Cancer.  I have done the exam and reviewed the above chart and it is accurate to the best of my knowledge.  Miguel Aschoff MD [facility] 03/13/2015 10:30 AM

## 2015-03-14 DIAGNOSIS — I714 Abdominal aortic aneurysm, without rupture: Secondary | ICD-10-CM | POA: Diagnosis not present

## 2015-03-14 DIAGNOSIS — I255 Ischemic cardiomyopathy: Secondary | ICD-10-CM | POA: Diagnosis not present

## 2015-03-14 DIAGNOSIS — Z95810 Presence of automatic (implantable) cardiac defibrillator: Secondary | ICD-10-CM | POA: Diagnosis not present

## 2015-03-14 DIAGNOSIS — I10 Essential (primary) hypertension: Secondary | ICD-10-CM | POA: Diagnosis not present

## 2015-03-14 DIAGNOSIS — I493 Ventricular premature depolarization: Secondary | ICD-10-CM | POA: Diagnosis not present

## 2015-03-14 DIAGNOSIS — I340 Nonrheumatic mitral (valve) insufficiency: Secondary | ICD-10-CM | POA: Diagnosis not present

## 2015-03-14 DIAGNOSIS — N189 Chronic kidney disease, unspecified: Secondary | ICD-10-CM | POA: Diagnosis not present

## 2015-03-14 DIAGNOSIS — I2510 Atherosclerotic heart disease of native coronary artery without angina pectoris: Secondary | ICD-10-CM | POA: Diagnosis not present

## 2015-03-14 DIAGNOSIS — I5022 Chronic systolic (congestive) heart failure: Secondary | ICD-10-CM | POA: Diagnosis not present

## 2015-03-14 DIAGNOSIS — R0602 Shortness of breath: Secondary | ICD-10-CM | POA: Diagnosis not present

## 2015-03-14 DIAGNOSIS — I739 Peripheral vascular disease, unspecified: Secondary | ICD-10-CM | POA: Diagnosis not present

## 2015-03-14 DIAGNOSIS — E782 Mixed hyperlipidemia: Secondary | ICD-10-CM | POA: Diagnosis not present

## 2015-03-28 ENCOUNTER — Other Ambulatory Visit: Payer: Self-pay | Admitting: *Deleted

## 2015-03-28 ENCOUNTER — Encounter: Payer: Self-pay | Admitting: *Deleted

## 2015-03-28 NOTE — Patient Outreach (Signed)
[facility]) Care Management  03/28/2015  Phillip Allison [birth year] [id number]   RN Health Coach telephone to patient. [employer] compliance verified. RN Health Coach spoke with patient and described the services available through [employer] Patient declined service. RN Health Coach will send an outreach packet to patient.  [facility] Care Management [phone]

## 2015-03-28 NOTE — Patient Outreach (Signed)
[facility]) Care Management  03/28/2015  Phillip Allison [birth year] [id number]   Referral from [employer] Tier 2 List, assigned Johny Shock, RN to outreach for [place] Management services.  Thanks, Ronnell Freshwater. [place], [place] Assistant Phone: [phone] Fax: [phone]

## 2015-03-31 NOTE — Patient Outreach (Signed)
[facility]) Care Management  03/31/2015  Phillip Allison [birth year] [id number]   Notification from Johny Shock, RN to close case due to patient refused [place] Management services.  Thanks, Ronnell Freshwater. [place], [place] Assistant Phone: [phone] Fax: [phone]

## 2015-05-04 DIAGNOSIS — I519 Heart disease, unspecified: Secondary | ICD-10-CM | POA: Diagnosis not present

## 2015-05-25 DIAGNOSIS — H2513 Age-related nuclear cataract, bilateral: Secondary | ICD-10-CM | POA: Diagnosis not present

## 2015-06-29 DIAGNOSIS — I6523 Occlusion and stenosis of bilateral carotid arteries: Secondary | ICD-10-CM | POA: Diagnosis not present

## 2015-06-29 DIAGNOSIS — I714 Abdominal aortic aneurysm, without rupture: Secondary | ICD-10-CM | POA: Diagnosis not present

## 2015-06-29 DIAGNOSIS — I6529 Occlusion and stenosis of unspecified carotid artery: Secondary | ICD-10-CM | POA: Diagnosis not present

## 2015-06-29 DIAGNOSIS — F17200 Nicotine dependence, unspecified, uncomplicated: Secondary | ICD-10-CM | POA: Diagnosis not present

## 2015-06-29 DIAGNOSIS — E785 Hyperlipidemia, unspecified: Secondary | ICD-10-CM | POA: Diagnosis not present

## 2015-07-03 ENCOUNTER — Other Ambulatory Visit: Payer: Self-pay | Admitting: Family Medicine

## 2015-07-04 ENCOUNTER — Telehealth: Payer: Self-pay | Admitting: Family Medicine

## 2015-07-04 NOTE — Telephone Encounter (Signed)
Please review-aa 

## 2015-07-04 NOTE — Telephone Encounter (Signed)
Pt called to let Dr Rosanna Randy know his wife past away this morning.  CB#[phone]/MW

## 2015-07-13 ENCOUNTER — Ambulatory Visit (INDEPENDENT_AMBULATORY_CARE_PROVIDER_SITE_OTHER): Payer: Medicare Other | Admitting: Family Medicine

## 2015-07-13 ENCOUNTER — Encounter: Payer: Self-pay | Admitting: Family Medicine

## 2015-07-13 VITALS — BP 122/60 | HR 74 | Temp 97.4°F | Resp 16 | Wt 176.0 lb

## 2015-07-13 DIAGNOSIS — F320 Major depressive disorder, single episode, mild: Secondary | ICD-10-CM | POA: Diagnosis not present

## 2015-07-13 DIAGNOSIS — E7800 Pure hypercholesterolemia, unspecified: Secondary | ICD-10-CM

## 2015-07-13 DIAGNOSIS — I10 Essential (primary) hypertension: Secondary | ICD-10-CM

## 2015-07-13 DIAGNOSIS — I255 Ischemic cardiomyopathy: Secondary | ICD-10-CM | POA: Diagnosis not present

## 2015-07-13 NOTE — Progress Notes (Signed)
Patient ID: Phillip Allison, male   DOB: 08-14-1940, 75 y.o.   MRN: [id number]    Subjective:  HPI  Hypertension, follow-up:  BP Readings from Last 3 Encounters:  07/13/15 122/60  03/13/15 112/64  09/12/14 102/58    He was last seen for hypertension 4 months ago.  BP at that visit was 112/64. Management since that visit includes none. He reports good compliance with treatment. He is not having side effects.  He is not exercising.  Outside blood pressures are being checked occasionally. He is experiencing none.  Patient denies chest pain, chest pressure/discomfort, dyspnea and palpitations.   Wt Readings from Last 3 Encounters:  07/13/15 176 lb (79.833 kg)  03/13/15 183 lb (83.008 kg)  09/12/14 187 lb (84.823 kg)    ------------------------------------------------------------------------ Pt reports that he is handling the death of his wife, as to be expected. "Its different, but I'm doing ok".     Prior to Admission medications   Medication Sig Start Date End Date Taking? Authorizing Provider  aspirin 81 MG tablet Take by mouth. 08/22/11  Yes Historical Provider, MD  carvedilol (COREG) 12.5 MG tablet Take by mouth. 08/22/11  Yes Historical Provider, MD  Fluticasone-Salmeterol (ADVAIR DISKUS) 250-50 MCG/DOSE AEPB Inhale into the lungs. 09/23/14  Yes Historical Provider, MD  Fluticasone-Salmeterol (ADVAIR DISKUS) 250-50 MCG/DOSE AEPB Inhale into the lungs.   Yes Historical Provider, MD  furosemide (LASIX) 20 MG tablet Take by mouth. 08/22/11  Yes Historical Provider, MD  lisinopril (PRINIVIL,ZESTRIL) 5 MG tablet Take by mouth. 08/22/11  Yes Historical Provider, MD  mirtazapine (REMERON) 30 MG tablet TAKE 1 TABLET BY MOUTH ONCE DAILY 02/15/15  Yes Jerrol Banana., MD  pravastatin (PRAVACHOL) 40 MG tablet TAKE 1 TABLET BY MOUTH AT BEDTIME. 07/03/15  Yes Jerrol Banana., MD    Patient Active Problem List   Diagnosis Date Noted  . History of tobacco use 03/13/2015    . History of atrial fibrillation 03/13/2015  . Chronic systolic heart failure ([place]) 03/13/2015  . Intraventricular block 03/13/2015  . 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[place]) 03/10/2015  . Cardiovascular disease 03/10/2015  . Arteriosclerosis of coronary artery 03/10/2015  . CAFL (chronic airflow limitation) ([place]) 03/10/2015  . Chronic kidney disease (CKD), stage III (moderate) 03/10/2015  . Depression, major, single episode, mild ([place]) 03/10/2015  . 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 Acid reflux 03/10/2015  . Heart & renal disease, hypertensive, with heart failure ([place]) 03/10/2015  . Cardiomyopathy, ischemic 03/10/2015  . 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[place]) 03/10/2015  . Hypercholesterolemia without hypertriglyceridemia 03/10/2015  . Current smoker 03/10/2015  . Cardiac defibrillator in place 01/25/2015  . MI (mitral incompetence) 11/01/2014  . Benign essential HTN 10/17/2014    History reviewed. No pertinent past medical history.  Social History   Social History  . Marital Status: Married    Spouse Name: N/A  . Number of Children: N/A  . Years of Education: N/A   Occupational History  . Not on file.   Social History Main Topics  . Smoking status: Former Smoker -- 0.50 packs/day for 50 years    Types: Cigarettes  . Smokeless tobacco: Never Used  . Alcohol Use: No  . Drug Use: No  . Sexual Activity: Not Currently   Other Topics Concern  . Not on file   Social History Narrative    No Known Allergies  Review of Systems  Constitutional: Negative.   HENT: Negative.   Eyes: Negative.   Respiratory: Negative.  Cardiovascular: Negative.   Gastrointestinal: Negative.   Genitourinary: Negative.   Musculoskeletal: Negative.   Skin: Negative.   Neurological: Negative.   Endo/Heme/Allergies: Negative.   Psychiatric/Behavioral: Negative.     Immunization History  Administered Date(s) Administered  . Influenza, High Dose Seasonal PF 03/13/2015  .  Influenza-Unspecified 02/22/2014  . Pneumococcal Conjugate-13 03/13/2015  . Pneumococcal Polysaccharide-23 04/18/2005, 04/23/2012  . Td 09/15/2003   Objective:  BP 122/60 mmHg  Pulse 74  Temp(Src) 97.4 F (36.3 C) (Oral)  Resp 16  Wt 176 lb (79.833 kg)  Physical Exam  Constitutional: He is oriented to person, place, and time and well-developed, well-nourished, and in no distress.  HENT:  Head: Normocephalic and atraumatic.  Right Ear: External ear normal.  Left Ear: External ear normal.  Nose: Nose normal.  Eyes: Conjunctivae and EOM are normal. Pupils are equal, round, and reactive to light.  Neck: Normal range of motion. Neck supple.  Cardiovascular: Normal rate, regular rhythm, normal heart sounds and intact distal pulses.   Pulmonary/Chest: Effort normal and breath sounds normal.  Abdominal: Soft.  Neurological: He is alert and oriented to person, place, and time. He has normal reflexes. Gait normal. GCS score is 15.  Skin: Skin is warm and dry.  Psychiatric: Mood, memory, affect and judgment normal.    Lab Results  Component Value Date   WBC 8.2 01/24/2014   HGB 14.4 01/24/2014   HCT 42 01/24/2014   PLT 232 01/24/2014   GLUCOSE 99 07/21/2012   CHOL 150 01/24/2014   TRIG 131 01/24/2014   HDL 27* 01/24/2014   LDLCALC 97 01/24/2014   TSH 1.31 01/24/2014   INR 1.1 07/21/2012   HGBA1C 6.0 08/14/2011    CMP     Component Value Date/Time   NA 142 09/12/2014   NA 138 07/21/2012 1129   K 4.7 09/12/2014   K 4.4 07/21/2012 1129   CL 105 07/21/2012 1129   CO2 27 07/21/2012 1129   GLUCOSE 99 07/21/2012 1129   BUN 30* 09/12/2014   BUN 32* 07/21/2012 1129   CREATININE 2.2* 09/12/2014   CREATININE 1.73* 07/21/2012 1129   CALCIUM 9.0 07/21/2012 1129   PROT 5.8* 12/19/2011 0506   ALBUMIN 2.9* 12/19/2011 0506   AST 11* 01/24/2014   AST 15 12/19/2011 0506   ALT 9* 01/24/2014   ALT 11* 12/19/2011 0506   ALKPHOS 79 01/24/2014   ALKPHOS 76 12/19/2011 0506   BILITOT  0.6 12/19/2011 0506   GFRNONAA 39* 07/21/2012 1129   GFRNONAA 49* 08/14/2011 0608   GFRAA 45* 07/21/2012 1129   GFRAA 59* 08/14/2011 0608    Assessment and Plan :  1. Benign essential HTN  - CBC with Differential/Platelet - TSH - Comprehensive metabolic panel  2. Depression, major, single episode, mild ([place]) Presently controlled, and remission. He is also grieving the death of his wife 2 weeks ago. - Comprehensive metabolic panel  3. Hypercholesterolemia without hypertriglyceridemia  - Lipid Panel With LDL/HDL Ratio - Comprehensive metabolic panel  4. Cardiomyopathy, ischemic   I have done the exam and reviewed the above chart and it is accurate to the best of my knowledge.  Patient was seen and examined by Dr. Miguel Aschoff, and noted scribed by Webb Laws, [place] MD [facility] 07/13/2015 11:40 AM

## 2015-07-14 ENCOUNTER — Telehealth: Payer: Self-pay

## 2015-07-14 LAB — CBC WITH DIFFERENTIAL/PLATELET
Basophils Absolute: 0 10*3/uL (ref 0.0–0.2)
Basos: 0 %
EOS (ABSOLUTE): 0.4 10*3/uL (ref 0.0–0.4)
EOS: 4 %
HEMATOCRIT: 43.8 % (ref 37.5–51.0)
Hemoglobin: 14.8 g/dL (ref 12.6–17.7)
Immature Grans (Abs): 0.1 10*3/uL (ref 0.0–0.1)
Immature Granulocytes: 1 %
LYMPHS ABS: 3.1 10*3/uL (ref 0.7–3.1)
Lymphs: 33 %
MCH: 30 pg (ref 26.6–33.0)
MCHC: 33.8 g/dL (ref 31.5–35.7)
MCV: 89 fL (ref 79–97)
MONOS ABS: 0.7 10*3/uL (ref 0.1–0.9)
Monocytes: 8 %
Neutrophils Absolute: 5.2 10*3/uL (ref 1.4–7.0)
Neutrophils: 54 %
Platelets: 221 10*3/uL (ref 150–379)
RBC: 4.93 x10E6/uL (ref 4.14–5.80)
RDW: 13.5 % (ref 12.3–15.4)
WBC: 9.5 10*3/uL (ref 3.4–10.8)

## 2015-07-14 LAB — LIPID PANEL WITH LDL/HDL RATIO
Cholesterol, Total: 140 mg/dL (ref 100–199)
HDL: 32 mg/dL — ABNORMAL LOW (ref >39)
LDL Calculated: 88 mg/dL (ref 0–99)
LDL/HDL RATIO: 2.8 ratio (ref 0.0–3.6)
TRIGLYCERIDES: 98 mg/dL (ref 0–149)
VLDL Cholesterol Cal: 20 mg/dL (ref 5–40)

## 2015-07-14 LAB — COMPREHENSIVE METABOLIC PANEL
ALK PHOS: 80 IU/L (ref 39–117)
ALT: 11 IU/L (ref 0–44)
AST: 17 IU/L (ref 0–40)
Albumin/Globulin Ratio: 1.7 (ref 1.1–2.5)
Albumin: 4.5 g/dL (ref 3.5–4.8)
BUN / CREAT RATIO: 22 (ref 10–22)
BUN: 47 mg/dL — AB (ref 8–27)
Bilirubin Total: 0.4 mg/dL (ref 0.0–1.2)
CALCIUM: 9.7 mg/dL (ref 8.6–10.2)
CO2: 25 mmol/L (ref 18–29)
CREATININE: 2.18 mg/dL — AB (ref 0.76–1.27)
Chloride: 101 mmol/L (ref 96–106)
GFR calc Af Amer: 33 mL/min/{1.73_m2} — ABNORMAL LOW (ref >59)
GFR, EST NON AFRICAN AMERICAN: 29 mL/min/{1.73_m2} — AB (ref >59)
Globulin, Total: 2.6 g/dL (ref 1.5–4.5)
Glucose: 98 mg/dL (ref 65–99)
Potassium: 4.8 mmol/L (ref 3.5–5.2)
SODIUM: 143 mmol/L (ref 134–144)
Total Protein: 7.1 g/dL (ref 6.0–8.5)

## 2015-07-14 LAB — TSH: TSH: 1.57 u[IU]/mL (ref 0.450–4.500)

## 2015-07-14 NOTE — Telephone Encounter (Signed)
[id number]-aa 

## 2015-07-14 NOTE — Telephone Encounter (Signed)
-----   Message from Jerrol Banana., MD sent at 07/14/2015 10:13 AM EST ----- Stable labs

## 2015-07-14 NOTE — Telephone Encounter (Signed)
Pt advised-aa 

## 2015-08-15 DIAGNOSIS — I739 Peripheral vascular disease, unspecified: Secondary | ICD-10-CM | POA: Diagnosis not present

## 2015-08-15 DIAGNOSIS — I5022 Chronic systolic (congestive) heart failure: Secondary | ICD-10-CM | POA: Diagnosis not present

## 2015-08-15 DIAGNOSIS — I255 Ischemic cardiomyopathy: Secondary | ICD-10-CM | POA: Diagnosis not present

## 2015-08-15 DIAGNOSIS — I2510 Atherosclerotic heart disease of native coronary artery without angina pectoris: Secondary | ICD-10-CM | POA: Diagnosis not present

## 2015-08-15 DIAGNOSIS — I714 Abdominal aortic aneurysm, without rupture: Secondary | ICD-10-CM | POA: Diagnosis not present

## 2015-08-15 DIAGNOSIS — I519 Heart disease, unspecified: Secondary | ICD-10-CM | POA: Diagnosis not present

## 2015-08-29 ENCOUNTER — Encounter: Payer: Self-pay | Admitting: *Deleted

## 2015-09-11 ENCOUNTER — Ambulatory Visit (INDEPENDENT_AMBULATORY_CARE_PROVIDER_SITE_OTHER): Payer: Medicare Other | Admitting: Family Medicine

## 2015-09-11 VITALS — BP 102/50 | HR 60 | Temp 97.9°F | Resp 16 | Wt 175.0 lb

## 2015-09-11 DIAGNOSIS — I255 Ischemic cardiomyopathy: Secondary | ICD-10-CM

## 2015-09-11 DIAGNOSIS — F320 Major depressive disorder, single episode, mild: Secondary | ICD-10-CM

## 2015-09-11 NOTE — Progress Notes (Signed)
Patient ID: Phillip Allison, male   DOB: Jan 06, 1941, 75 y.o.   MRN: [id number]   Phillip Allison  MRN: [id number] DOB: 04/06/1941  Subjective:  HPI   1. Depression, major, single episode, mild ([place]) The patient is a 75 year old male who presents for follow up of his depression and weight loss.  He states he is coping with the death of his wife and his weight has stabilized.  He has lost only 1 pound since his January visit.  Patient Active Problem List   Diagnosis Date Noted  . History of tobacco use 03/13/2015  . History of atrial fibrillation 03/13/2015  . Chronic systolic heart failure ([place]) 03/13/2015  . Intraventricular block 03/13/2015  . 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[place]) 03/10/2015  . Cardiovascular disease 03/10/2015  . Arteriosclerosis of coronary artery 03/10/2015  . CAFL (chronic airflow limitation) ([place]) 03/10/2015  . Chronic kidney disease (CKD), stage III (moderate) 03/10/2015  . Depression, major, single episode, mild ([place]) 03/10/2015  . 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 Acid reflux 03/10/2015  . Heart & renal disease, hypertensive, with heart failure ([place]) 03/10/2015  . Cardiomyopathy, ischemic 03/10/2015  . 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[place]) 03/10/2015  . Hypercholesterolemia without hypertriglyceridemia 03/10/2015  . Current smoker 03/10/2015  . Cardiac defibrillator in place 01/25/2015  . MI (mitral incompetence) 11/01/2014  . Benign essential HTN 10/17/2014    No past medical history on file.  Social History   Social History  . Marital Status: Widowed    Spouse Name: N/A  . Number of Children: N/A  . Years of Education: N/A   Occupational History  . Not on file.   Social History Main Topics  . Smoking status: Former Smoker -- 0.50 packs/day for 50 years    Types: Cigarettes  . Smokeless tobacco: Never Used  . Alcohol Use: No  . Drug Use: No  . Sexual Activity: Not Currently   Other Topics Concern  . Not on file   Social  History Narrative    Outpatient Prescriptions Prior to Visit  Medication Sig Dispense Refill  . aspirin 81 MG tablet Take by mouth.    . carvedilol (COREG) 12.5 MG tablet Take by mouth.    . Fluticasone-Salmeterol (ADVAIR DISKUS) 250-50 MCG/DOSE AEPB Inhale into the lungs.    . furosemide (LASIX) 20 MG tablet Take by mouth.    Marland Kitchen lisinopril (PRINIVIL,ZESTRIL) 5 MG tablet Take by mouth.    . mirtazapine (REMERON) 30 MG tablet TAKE 1 TABLET BY MOUTH ONCE DAILY 30 tablet 12  . pravastatin (PRAVACHOL) 40 MG tablet TAKE 1 TABLET BY MOUTH AT BEDTIME. 90 tablet 3  . Fluticasone-Salmeterol (ADVAIR DISKUS) 250-50 MCG/DOSE AEPB Inhale into the lungs.     No facility-administered medications prior to visit.    No Known Allergies  Review of Systems  Constitutional: Negative for fever, chills and malaise/fatigue. Weight loss: 1 pound since last visit.  Respiratory: Positive for cough (occasional). Negative for shortness of breath and wheezing.   Cardiovascular: Negative for chest pain, palpitations, orthopnea and leg swelling.  Neurological: Negative for dizziness, weakness and headaches.  Psychiatric/Behavioral: Negative for depression, suicidal ideas, hallucinations, memory loss and substance abuse. The patient is not nervous/anxious and does not have insomnia.    Objective:  BP 102/50 mmHg  Pulse 60  Temp(Src) 97.9 F (36.6 C) (Oral)  Resp 16  Wt 175 lb (79.379 kg)  Physical Exam  Constitutional: He is oriented to person, place, and time and well-developed, well-nourished, and  in no distress.  HENT:  Head: Normocephalic and atraumatic.  Right Ear: External ear normal.  Left Ear: External ear normal.  Nose: Nose normal.  Eyes: Conjunctivae are normal.  Neck: Neck supple.  Cardiovascular: Normal rate, regular rhythm and normal heart sounds.   Pulmonary/Chest: Effort normal and breath sounds normal.  Abdominal: Soft.  Neurological: He is alert and oriented to person, place, and time.    Skin: Skin is warm and dry.  Psychiatric: Mood, memory, affect and judgment normal.    Assessment and Plan :  Depression, major, single episode, mild (HCC) CAD/ischemic cardiomyopathy All risk factors treated COPD I have done the exam and reviewed the above chart and it is accurate to the best of my knowledge.  Miguel Aschoff MD [facility] 09/11/2015 10:54 AM

## 2015-11-16 DIAGNOSIS — E782 Mixed hyperlipidemia: Secondary | ICD-10-CM | POA: Diagnosis not present

## 2015-11-16 DIAGNOSIS — I519 Heart disease, unspecified: Secondary | ICD-10-CM | POA: Diagnosis not present

## 2015-11-16 DIAGNOSIS — I5022 Chronic systolic (congestive) heart failure: Secondary | ICD-10-CM | POA: Diagnosis not present

## 2015-11-16 DIAGNOSIS — I2510 Atherosclerotic heart disease of native coronary artery without angina pectoris: Secondary | ICD-10-CM | POA: Diagnosis not present

## 2015-11-16 DIAGNOSIS — I255 Ischemic cardiomyopathy: Secondary | ICD-10-CM | POA: Diagnosis not present

## 2015-12-05 DIAGNOSIS — H2513 Age-related nuclear cataract, bilateral: Secondary | ICD-10-CM | POA: Diagnosis not present

## 2015-12-14 DIAGNOSIS — H2513 Age-related nuclear cataract, bilateral: Secondary | ICD-10-CM | POA: Diagnosis not present

## 2015-12-15 ENCOUNTER — Encounter: Payer: Self-pay | Admitting: *Deleted

## 2015-12-17 NOTE — H&P (Signed)
See scanned note.

## 2015-12-18 ENCOUNTER — Encounter
Admission: RE | Disposition: A | Discharge status: Home / Self Care | Payer: Self-pay | Source: Ambulatory Visit | Attending: Ophthalmology

## 2015-12-18 ENCOUNTER — Ambulatory Visit
Admission: RE | Admit: 2015-12-18 | Discharge: 2015-12-18 | Disposition: A | Discharge status: Home / Self Care | Payer: Medicare Other | Source: Ambulatory Visit | Attending: Ophthalmology | Admitting: Ophthalmology

## 2015-12-18 ENCOUNTER — Encounter: Payer: Self-pay | Admitting: *Deleted

## 2015-12-18 ENCOUNTER — Ambulatory Visit: Payer: Medicare Other | Admitting: Anesthesiology

## 2015-12-18 DIAGNOSIS — I2510 Atherosclerotic heart disease of native coronary artery without angina pectoris: Secondary | ICD-10-CM | POA: Diagnosis not present

## 2015-12-18 DIAGNOSIS — H2513 Age-related nuclear cataract, bilateral: Secondary | ICD-10-CM | POA: Diagnosis not present

## 2015-12-18 DIAGNOSIS — I739 Peripheral vascular disease, unspecified: Secondary | ICD-10-CM | POA: Diagnosis not present

## 2015-12-18 DIAGNOSIS — Z950 Presence of cardiac pacemaker: Secondary | ICD-10-CM | POA: Diagnosis not present

## 2015-12-18 DIAGNOSIS — K219 Gastro-esophageal reflux disease without esophagitis: Secondary | ICD-10-CM | POA: Insufficient documentation

## 2015-12-18 DIAGNOSIS — H2511 Age-related nuclear cataract, right eye: Secondary | ICD-10-CM | POA: Diagnosis not present

## 2015-12-18 DIAGNOSIS — Z87891 Personal history of nicotine dependence: Secondary | ICD-10-CM | POA: Diagnosis not present

## 2015-12-18 DIAGNOSIS — I252 Old myocardial infarction: Secondary | ICD-10-CM | POA: Insufficient documentation

## 2015-12-18 DIAGNOSIS — I4891 Unspecified atrial fibrillation: Secondary | ICD-10-CM | POA: Diagnosis not present

## 2015-12-18 DIAGNOSIS — J449 Chronic obstructive pulmonary disease, unspecified: Secondary | ICD-10-CM | POA: Diagnosis not present

## 2015-12-18 DIAGNOSIS — F329 Major depressive disorder, single episode, unspecified: Secondary | ICD-10-CM | POA: Insufficient documentation

## 2015-12-18 DIAGNOSIS — I110 Hypertensive heart disease with heart failure: Secondary | ICD-10-CM | POA: Diagnosis not present

## 2015-12-18 DIAGNOSIS — I509 Heart failure, unspecified: Secondary | ICD-10-CM | POA: Insufficient documentation

## 2015-12-18 HISTORY — DX: Heart failure, unspecified: I50.9

## 2015-12-18 HISTORY — DX: Acute myocardial infarction, unspecified: I21.9

## 2015-12-18 HISTORY — DX: Major depressive disorder, single episode, unspecified: F32.9

## 2015-12-18 HISTORY — DX: Presence of cardiac pacemaker: Z95.0

## 2015-12-18 HISTORY — DX: Depression, unspecified: F32.A

## 2015-12-18 HISTORY — PX: CATARACT EXTRACTION W/PHACO: SHX586

## 2015-12-18 HISTORY — DX: Chronic obstructive pulmonary disease, unspecified: J44.9

## 2015-12-18 HISTORY — DX: Pneumonia, unspecified organism: J18.9

## 2015-12-18 HISTORY — DX: Emphysema, unspecified: J43.9

## 2015-12-18 HISTORY — DX: Dependence on supplemental oxygen: Z99.81

## 2015-12-18 HISTORY — DX: Essential (primary) hypertension: I10

## 2015-12-18 HISTORY — DX: Pure hypercholesterolemia, unspecified: E78.00

## 2015-12-18 HISTORY — DX: Reserved for inherently not codable concepts without codable children: IMO0001

## 2015-12-18 SURGERY — PHACOEMULSIFICATION, CATARACT, WITH IOL INSERTION
Anesthesia: Monitor Anesthesia Care | Site: Eye | Laterality: Right

## 2015-12-18 MED ORDER — HYALURONIDASE HUMAN 150 UNIT/ML IJ SOLN
INTRAMUSCULAR | Status: AC
  Filled 2015-12-18: qty 1

## 2015-12-18 MED ORDER — BUPIVACAINE HCL (PF) 0.75 % IJ SOLN
INTRAMUSCULAR | Status: AC
  Filled 2015-12-18: qty 10

## 2015-12-18 MED ORDER — EPINEPHRINE HCL 1 MG/ML IJ SOLN
INTRAOCULAR | Status: DC | PRN
  Administered 2015-12-18: 1 mL via OPHTHALMIC

## 2015-12-18 MED ORDER — EPINEPHRINE HCL 1 MG/ML IJ SOLN
INTRAMUSCULAR | Status: AC
  Filled 2015-12-18: qty 2

## 2015-12-18 MED ORDER — PHENYLEPHRINE HCL 10 % OP SOLN
1.0000 [drp] | Freq: Once | OPHTHALMIC | Status: AC
  Administered 2015-12-18 (×2): 1 [drp] via OPHTHALMIC

## 2015-12-18 MED ORDER — ALFENTANIL 500 MCG/ML IJ INJ
INJECTION | INTRAMUSCULAR | Status: DC | PRN
  Administered 2015-12-18: 500 ug via INTRAVENOUS

## 2015-12-18 MED ORDER — POVIDONE-IODINE 5 % OP SOLN
OPHTHALMIC | Status: AC
  Filled 2015-12-18: qty 30

## 2015-12-18 MED ORDER — MOXIFLOXACIN HCL 0.5 % OP SOLN
OPHTHALMIC | Status: DC | PRN
  Administered 2015-12-18: 1 [drp] via OPHTHALMIC

## 2015-12-18 MED ORDER — CYCLOPENTOLATE HCL 2 % OP SOLN
1.0000 [drp] | Freq: Once | OPHTHALMIC | Status: AC
  Administered 2015-12-18 (×4): 1 [drp] via OPHTHALMIC

## 2015-12-18 MED ORDER — LIDOCAINE HCL (PF) 4 % IJ SOLN
INTRAMUSCULAR | Status: DC | PRN
  Administered 2015-12-18: 4 mL via OPHTHALMIC

## 2015-12-18 MED ORDER — PHENYLEPHRINE HCL 10 % OP SOLN
OPHTHALMIC | Status: AC
  Administered 2015-12-18: 07:00:00
  Filled 2015-12-18: qty 5

## 2015-12-18 MED ORDER — TETRACAINE HCL 0.5 % OP SOLN
OPHTHALMIC | Status: DC | PRN
  Administered 2015-12-18: 1 [drp] via OPHTHALMIC

## 2015-12-18 MED ORDER — POVIDONE-IODINE 5 % OP SOLN
OPHTHALMIC | Status: DC | PRN
  Administered 2015-12-18: 1 via OPHTHALMIC

## 2015-12-18 MED ORDER — MIDAZOLAM HCL 2 MG/2ML IJ SOLN
INTRAMUSCULAR | Status: DC | PRN
  Administered 2015-12-18 (×2): 0.5 mg via INTRAVENOUS

## 2015-12-18 MED ORDER — MOXIFLOXACIN HCL 0.5 % OP SOLN
OPHTHALMIC | Status: AC
  Administered 2015-12-18: 07:00:00
  Filled 2015-12-18: qty 3

## 2015-12-18 MED ORDER — MOXIFLOXACIN HCL 0.5 % OP SOLN
1.0000 [drp] | Freq: Once | OPHTHALMIC | Status: AC
  Administered 2015-12-18 (×3): 1 [drp] via OPHTHALMIC

## 2015-12-18 MED ORDER — LIDOCAINE HCL (PF) 4 % IJ SOLN
INTRAMUSCULAR | Status: AC
  Filled 2015-12-18: qty 5

## 2015-12-18 MED ORDER — CEFUROXIME OPHTHALMIC INJECTION 1 MG/0.1 ML
INJECTION | OPHTHALMIC | Status: AC
  Filled 2015-12-18: qty 0.1

## 2015-12-18 MED ORDER — CEFUROXIME OPHTHALMIC INJECTION 1 MG/0.1 ML
INJECTION | OPHTHALMIC | Status: DC | PRN
  Administered 2015-12-18: .1 mL via INTRACAMERAL

## 2015-12-18 MED ORDER — CYCLOPENTOLATE HCL 2 % OP SOLN
OPHTHALMIC | Status: AC
  Administered 2015-12-18: 07:00:00
  Filled 2015-12-18: qty 2

## 2015-12-18 MED ORDER — SODIUM CHLORIDE 0.9 % IV SOLN
INTRAVENOUS | Status: DC
  Administered 2015-12-18: 07:00:00 via INTRAVENOUS

## 2015-12-18 MED ORDER — CARBACHOL 0.01 % IO SOLN
INTRAOCULAR | Status: DC | PRN
  Administered 2015-12-18: .5 mL via INTRAOCULAR

## 2015-12-18 MED ORDER — LIDOCAINE HCL (PF) 4 % IJ SOLN
INTRAOCULAR | Status: DC | PRN
  Administered 2015-12-18: .5 mL via OPHTHALMIC

## 2015-12-18 MED ORDER — NA CHONDROIT SULF-NA HYALURON 40-17 MG/ML IO SOLN
INTRAOCULAR | Status: AC
  Filled 2015-12-18: qty 1

## 2015-12-18 MED ORDER — TETRACAINE HCL 0.5 % OP SOLN
OPHTHALMIC | Status: AC
  Filled 2015-12-18: qty 2

## 2015-12-18 MED ORDER — NA CHONDROIT SULF-NA HYALURON 40-17 MG/ML IO SOLN
INTRAOCULAR | Status: DC | PRN
  Administered 2015-12-18: 1 mL via INTRAOCULAR

## 2015-12-18 SURGICAL SUPPLY — 30 items
CANNULA ANT/CHMB 27GA (MISCELLANEOUS) ×3 IMPLANT
CORD BIP STRL DISP 12FT (MISCELLANEOUS) ×3 IMPLANT
CUP MEDICINE 2OZ PLAST GRAD ST (MISCELLANEOUS) ×3 IMPLANT
DRAPE XRAY CASSETTE 23X24 (DRAPES) ×3 IMPLANT
ERASER HMR WETFIELD 18G (MISCELLANEOUS) ×3 IMPLANT
GLOVE BIO SURGEON STRL SZ8 (GLOVE) ×3 IMPLANT
GLOVE SURG LX 6.5 MICRO (GLOVE) ×2
GLOVE SURG LX 8.0 MICRO (GLOVE) ×2
GLOVE SURG LX STRL 6.5 MICRO (GLOVE) ×1 IMPLANT
GLOVE SURG LX STRL 8.0 MICRO (GLOVE) ×1 IMPLANT
GOWN STRL REUS W/ TWL LRG LVL3 (GOWN DISPOSABLE) ×1 IMPLANT
GOWN STRL REUS W/ TWL XL LVL3 (GOWN DISPOSABLE) ×1 IMPLANT
GOWN STRL REUS W/TWL LRG LVL3 (GOWN DISPOSABLE) ×2
GOWN STRL REUS W/TWL XL LVL3 (GOWN DISPOSABLE) ×2
LENS IOL ACRSF IQ ULTRA 16.5 (Intraocular Lens) ×1 IMPLANT
LENS IOL ACRYSOF IQ 16.5 (Intraocular Lens) ×3 IMPLANT
PACK CATARACT (MISCELLANEOUS) ×3 IMPLANT
PACK CATARACT DINGLEDEIN LX (MISCELLANEOUS) ×3 IMPLANT
PACK EYE AFTER SURG (MISCELLANEOUS) ×3 IMPLANT
SHLD EYE VISITEC  UNIV (MISCELLANEOUS) ×3 IMPLANT
SOL BSS BAG (MISCELLANEOUS) ×3
SOL PREP PVP 2OZ (MISCELLANEOUS) ×3
SOLUTION BSS BAG (MISCELLANEOUS) ×1 IMPLANT
SOLUTION PREP PVP 2OZ (MISCELLANEOUS) ×1 IMPLANT
SUT SILK 5-0 (SUTURE) ×3 IMPLANT
SYR 3ML LL SCALE MARK (SYRINGE) ×3 IMPLANT
SYR 5ML LL (SYRINGE) ×3 IMPLANT
SYR TB 1ML 27GX1/2 LL (SYRINGE) ×3 IMPLANT
WATER STERILE IRR 1000ML POUR (IV SOLUTION) ×3 IMPLANT
WIPE NON LINTING 3.25X3.25 (MISCELLANEOUS) ×3 IMPLANT

## 2015-12-18 NOTE — Anesthesia Preprocedure Evaluation (Addendum)
Anesthesia Evaluation  Patient identified by MRN, date of birth, ID band Patient awake    Reviewed: Allergy & Precautions, NPO status , Patient's Chart, lab work & pertinent test results, reviewed documented beta blocker date and time   Airway Mallampati: II  TM Distance: >3 FB     Dental  (+) Chipped   Pulmonary shortness of breath, pneumonia, resolved, COPD, former smoker,           Cardiovascular hypertension, Pt. on medications and Pt. on home beta blockers + CAD, + Past MI and +CHF  Atrial Fibrillation + pacemaker      Neuro/Psych PSYCHIATRIC DISORDERS Depression    GI/Hepatic GERD  Controlled,  Endo/Other    Renal/GU Renal InsufficiencyRenal disease     Musculoskeletal   Abdominal   Peds  Hematology   Anesthesia Other Findings A Aneurysm repair. On O2 at nite as needed.  Reproductive/Obstetrics                            Anesthesia Physical Anesthesia Plan  ASA: III  Anesthesia Plan: MAC   Post-op Pain Management:    Induction:   Airway Management Planned:   Additional Equipment:   Intra-op Plan:   Post-operative Plan:   Informed Consent: I have reviewed the patients History and Physical, chart, labs and discussed the procedure including the risks, benefits and alternatives for the proposed anesthesia with the patient or authorized representative who has indicated his/her understanding and acceptance.     Plan Discussed with: CRNA  Anesthesia Plan Comments:         Anesthesia Quick Evaluation

## 2015-12-18 NOTE — Op Note (Signed)
Date of Surgery: 12/18/2015 Date of Dictation: 12/18/2015 8:57 AM Pre-operative Diagnosis:  Nuclear Sclerotic Cataract right Eye Post-operative Diagnosis: same Procedure performed: Extra-capsular Cataract Extraction (ECCE) with placement of a posterior chamber intraocular lens (IOL) right Eye IOL:  Implant Name Type Inv. Item Serial No. Manufacturer Lot No. LRB No. Used  LENS IOL ACRYSOF IQ 16.5 - [id number] Intraocular Lens LENS IOL ACRYSOF IQ 16.5 [id number] ALCON   Right 1   Anesthesia: 2% Lidocaine and 4% Marcaine in a 50/50 mixture with 10 unites/ml of Hylenex given as a peribulbar Anesthesiologist: Anesthesiologist: Gunnar Bulla, MD CRNA: Aline Brochure, CRNA Complications: none Estimated Blood Loss: less than 1 ml  Description of procedure:  The patient was given anesthesia and sedation via intravenous access. The patient was then prepped and draped in the usual fashion. A 25-gauge needle was bent for initiating the capsulorhexis. A 5-0 silk suture was placed through the conjunctiva superior and inferiorly to serve as bridle sutures. Hemostasis was obtained at the superior limbus using an eraser cautery. A partial thickness groove was made at the anterior surgical limbus with a 64 Beaver blade and this was dissected anteriorly with an Avaya. The anterior chamber was entered at 10 o'clock with a 1.0 mm paracentesis knife and through the lamellar dissection with a 2.6 mm Alcon keratome. Epi-Shugarcaine 0.5 CC [9 cc BSS Plus (Alcon), 3 cc 4% preservative-free lidocaine (Hospira) and 4 cc 1:1000 preservative-free, bisulfite-free epinephrine] was injected into the anterior chamber via the paracentesis tract. Epi-Shugarcaine 0.5 CC [9 cc BSS Plus (Alcon), 3 cc 4% preservative-free lidocaine (Hospira) and 4 cc 1:1000 preservative-free, bisulfite-free epinephrine] was injected into the anterior chamber via the paracentesis tract. DiscoVisc was injected to replace the aqueous and a  continuous tear curvilinear capsulorhexis was performed using a bent 25-gauge needle.  Balance salt on a syringe was used to perform hydro-dissection and phacoemulsification was carried out using a divide and conquer technique. Procedure(s) with comments: CATARACT EXTRACTION PHACO AND INTRAOCULAR LENS PLACEMENT (IOC) (Right) - [place] 01:58 AP% 23.5 CDE 50.91 fluid pack lot # [id number]. Irrigation/aspiration was used to remove the residual cortex and the capsular bag was inflated with DiscoVisc. The intraocular lens was inserted into the capsular bag using a pre-loaded UltraSert Delivery System. Irrigation/aspiration was used to remove the residual DiscoVisc. The wound was inflated with balanced salt and checked for leaks. None were found. Miostat was injected via the paracentesis track and 0.1 ml of cefuroxime containing 1 mg of drug  was injected via the paracentesis track. The wound was checked for leaks again and none were found.   The bridal sutures were removed and two drops of Vigamox were placed on the eye. An eye shield was placed to protect the eye and the patient was discharged to the recovery area in good condition.   Christophe Rising MD

## 2015-12-18 NOTE — Transfer of Care (Signed)
Immediate Anesthesia Transfer of Care Note  Patient: KYREECE LEHMAN  Procedure(s) Performed: Procedure(s) with comments: CATARACT EXTRACTION PHACO AND INTRAOCULAR LENS PLACEMENT (IOC) (Right) - [place] 01:58 AP% 23.5 CDE 50.91 fluid pack lot # [id number]  Patient Location: PACU  Anesthesia Type:MAC  Level of Consciousness: awake, alert  and oriented  Airway & Oxygen Therapy: Patient Spontanous Breathing  Post-op Assessment: Post -op Vital signs reviewed and stable  Post vital signs: stable  Last Vitals:  Filed Vitals:   12/18/15 0646 12/18/15 0859  BP: 153/69 100/52  Pulse: 75 59  Temp: 35.9 C 35.6 C  Resp: 16 14    Last Pain:  Filed Vitals:   12/18/15 0900  PainSc: 0-No pain         Complications: No apparent anesthesia complications

## 2015-12-18 NOTE — Anesthesia Postprocedure Evaluation (Signed)
Anesthesia Post Note  Patient: Phillip Allison  Procedure(s) Performed: Procedure(s) (LRB): CATARACT EXTRACTION PHACO AND INTRAOCULAR LENS PLACEMENT (IOC) (Right)  Patient location during evaluation: PACU Anesthesia Type: MAC Level of consciousness: awake and alert and oriented Pain management: pain level controlled Vital Signs Assessment: post-procedure vital signs reviewed and stable Respiratory status: spontaneous breathing Cardiovascular status: stable Postop Assessment: no signs of nausea or vomiting Anesthetic complications: no    Last Vitals:  Filed Vitals:   12/18/15 0646 12/18/15 0859  BP: 153/69 100/52  Pulse: 75 59  Temp: 35.9 C 35.6 C  Resp: 16 14    Last Pain:  Filed Vitals:   12/18/15 0900  PainSc: 0-No pain                 Londa Mackowski,  Clearnce Sorrel

## 2015-12-18 NOTE — Discharge Instructions (Addendum)
See handout.  Cataract Surgery, Care After Refer to this sheet in the next few weeks. These instructions provide you with information on caring for yourself after your procedure. Your caregiver may also give you more specific instructions. Your treatment has been planned according to current medical practices, but problems sometimes occur. Call your caregiver if you have any problems or questions after your procedure.  HOME CARE INSTRUCTIONS   Avoid strenuous activities as directed by your caregiver.  Ask your caregiver when you can resume driving.  Use eyedrops or other medicines to help healing and control pressure inside your eye as directed by your caregiver.  Only take over-the-counter or prescription medicines for pain, discomfort, or fever as directed by your caregiver.  Do not to touch or rub your eyes.  You may be instructed to use a protective shield during the first few days and nights after surgery. If not, wear sunglasses to protect your eyes. This is to protect the eye from pressure or from being accidentally bumped.  Keep the area around your eye clean and dry. Avoid swimming or allowing water to hit you directly in the face while showering. Keep soap and shampoo out of your eyes.  Do not bend or lift heavy objects. Bending increases pressure in the eye. You can walk, climb stairs, and do light household chores.  Do not put a contact lens into the eye that had surgery until your caregiver says it is okay to do so.  Ask your doctor when you can return to work. This will depend on the kind of work that you do. If you work in a dusty environment, you may be advised to wear protective eyewear for a period of time.  Ask your caregiver when it will be safe to engage in sexual activity.  Continue with your regular eye exams as directed by your caregiver. What to expect:  It is normal to feel itching and mild discomfort for a few days after cataract surgery. Some fluid discharge  is also common, and your eye may be sensitive to light and touch.  After 1 to 2 days, even moderate discomfort should disappear. In most cases, healing will take about 6 weeks.  If you received an intraocular lens (IOL), you may notice that colors are very bright or have a blue tinge. Also, if you have been in bright sunlight, everything may appear reddish for a few hours. If you see these color tinges, it is because your lens is clear and no longer cloudy. Within a few months after receiving an IOL, these extra colors should go away. When you have healed, you will probably need new glasses. SEEK MEDICAL CARE IF:   You have increased bruising around your eye.  You have discomfort not helped by medicine. SEEK IMMEDIATE MEDICAL CARE IF:   You have a fever.  You have a worsening or sudden vision loss.  You have redness, swelling, or increasing pain in the eye.  You have a thick discharge from the eye that had surgery. MAKE SURE YOU:  Understand these instructions.  Will watch your condition.  Will get help right away if you are not doing well or get worse.   This information is not intended to replace advice given to you by your health care provider. Make sure you discuss any questions you have with your health care provider.   Document Released: 12/28/2004 Document Revised: 07/01/2014 Document Reviewed: 02/01/2011 Elsevier Interactive Patient Education [employer].

## 2015-12-18 NOTE — Interval H&P Note (Signed)
History and Physical Interval Note:  12/18/2015 7:23 AM  Phillip Allison  has presented today for surgery, with the diagnosis of CATARACT  The various methods of treatment have been discussed with the patient and family. After consideration of risks, benefits and other options for treatment, the patient has consented to  Procedure(s): CATARACT EXTRACTION PHACO AND INTRAOCULAR LENS PLACEMENT ([place]) (Right) as a surgical intervention .  The patient's history has been reviewed, patient examined, no change in status, stable for surgery.  I have reviewed the patient's chart and labs.  Questions were answered to the patient's satisfaction.     Mitchell Epling

## 2016-01-02 DIAGNOSIS — I6523 Occlusion and stenosis of bilateral carotid arteries: Secondary | ICD-10-CM | POA: Diagnosis not present

## 2016-01-02 DIAGNOSIS — F17200 Nicotine dependence, unspecified, uncomplicated: Secondary | ICD-10-CM | POA: Diagnosis not present

## 2016-01-02 DIAGNOSIS — I714 Abdominal aortic aneurysm, without rupture: Secondary | ICD-10-CM | POA: Diagnosis not present

## 2016-01-02 DIAGNOSIS — I6529 Occlusion and stenosis of unspecified carotid artery: Secondary | ICD-10-CM | POA: Diagnosis not present

## 2016-01-02 DIAGNOSIS — I10 Essential (primary) hypertension: Secondary | ICD-10-CM | POA: Diagnosis not present

## 2016-01-02 DIAGNOSIS — E785 Hyperlipidemia, unspecified: Secondary | ICD-10-CM | POA: Diagnosis not present

## 2016-02-15 DIAGNOSIS — I519 Heart disease, unspecified: Secondary | ICD-10-CM | POA: Diagnosis not present

## 2016-03-07 ENCOUNTER — Other Ambulatory Visit: Payer: Self-pay | Admitting: Family Medicine

## 2016-03-07 NOTE — Telephone Encounter (Signed)
Please review-aa 

## 2016-03-11 ENCOUNTER — Encounter: Payer: Self-pay | Admitting: Family Medicine

## 2016-03-11 ENCOUNTER — Ambulatory Visit (INDEPENDENT_AMBULATORY_CARE_PROVIDER_SITE_OTHER): Payer: Medicare Other | Admitting: Family Medicine

## 2016-03-11 VITALS — BP 142/76 | HR 64 | Temp 98.1°F | Resp 14 | Wt 166.0 lb

## 2016-03-11 DIAGNOSIS — I255 Ischemic cardiomyopathy: Secondary | ICD-10-CM

## 2016-03-11 DIAGNOSIS — I10 Essential (primary) hypertension: Secondary | ICD-10-CM | POA: Diagnosis not present

## 2016-03-11 DIAGNOSIS — F320 Major depressive disorder, single episode, mild: Secondary | ICD-10-CM

## 2016-03-11 DIAGNOSIS — J449 Chronic obstructive pulmonary disease, unspecified: Secondary | ICD-10-CM | POA: Insufficient documentation

## 2016-03-11 DIAGNOSIS — Z23 Encounter for immunization: Secondary | ICD-10-CM

## 2016-03-11 NOTE — Progress Notes (Signed)
Phillip Allison  MRN: [id number] DOB: May 03, 1941  Subjective:  HPI  Patient is here for 6 months follow up. Hypertension: patient does not check b/p at home. No cardiac symptoms. BP Readings from Last 3 Encounters:  03/11/16 (!) 142/76  12/18/15 126/67  09/11/15 (!) 102/50   Depression: He takes Mirtazapine daily. Takes day by day. He has lost weight-11 lbs since last visit. Patient states he does eat and appetite seems ok. Wt Readings from Last 3 Encounters:  03/11/16 166 lb (75.3 kg)  12/18/15 175 lb (79.4 kg)  09/11/15 175 lb (79.4 kg)  His wife died earlier this year and he is dealing with that fine. He is getting root bradycardia to move out of their long-time house and he thinks this will help him continue to cope with the loss. Patient Active Problem List   Diagnosis Date Noted  . History of tobacco use 03/13/2015  . History of atrial fibrillation 03/13/2015  . Chronic systolic heart failure ([place]) 03/13/2015  . Intraventricular block 03/13/2015  . 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[place]) 03/10/2015  . Cardiovascular disease 03/10/2015  . Arteriosclerosis of coronary artery 03/10/2015  . CAFL (chronic airflow limitation) ([place]) 03/10/2015  . Chronic kidney disease (CKD), stage III (moderate) 03/10/2015  . Depression, major, single episode, mild ([place]) 03/10/2015  . 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 Acid reflux 03/10/2015  . Heart & renal disease, hypertensive, with heart failure ([place]) 03/10/2015  . Cardiomyopathy, ischemic 03/10/2015  . 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[place]) 03/10/2015  . Hypercholesterolemia without hypertriglyceridemia 03/10/2015  . Current smoker 03/10/2015  . Cardiac defibrillator in place 01/25/2015  . MI (mitral incompetence) 11/01/2014  . Benign essential HTN 10/17/2014    Past Medical History:  Diagnosis Date  . CHF (congestive heart failure) ([place])   . COPD (chronic obstructive pulmonary disease) ([place])   . Depression   . Emphysema lung  ([place])   . Hypercholesterolemia   . Hypertension   . Myocardial infarction ([place])   . On home oxygen therapy    2 liters at night and as needed  . Pneumonia    in the past  . Presence of permanent cardiac pacemaker   . Shortness of breath dyspnea    with exertion    Social History   Social History  . Marital status: Widowed    Spouse name: N/A  . Number of children: N/A  . Years of education: N/A   Occupational History  . Not on file.   Social History Main Topics  . Smoking status: Former Smoker    Packs/day: 0.50    Years: 50.00    Types: Cigarettes  . Smokeless tobacco: Never Used  . Alcohol use No  . Drug use: No  . Sexual activity: Not Currently   Other Topics Concern  . Not on file   Social History Narrative  . No narrative on file    Outpatient Encounter Prescriptions as of 03/11/2016  Medication Sig  . aspirin 81 MG tablet Take 81 mg by mouth at bedtime.   . carvedilol (COREG) 12.5 MG tablet Take 12.5 mg by mouth 2 (two) times daily with a meal.   . furosemide (LASIX) 20 MG tablet Take 20 mg by mouth 2 (two) times daily.   Marland Kitchen lisinopril (PRINIVIL,ZESTRIL) 5 MG tablet Take 5 mg by mouth daily.   . mirtazapine (REMERON) 30 MG tablet TAKE 1 TABLET BY MOUTH ONCE DAILY  . pravastatin (PRAVACHOL) 40 MG tablet TAKE 1 TABLET BY MOUTH AT  BEDTIME.   No facility-administered encounter medications on file as of 03/11/2016.     No Known Allergies  Review of Systems  Constitutional: Positive for malaise/fatigue (normal with age) and weight loss.  Eyes: Negative.   Respiratory: Negative.        Gets short of breath with bad humidity outside.  Cardiovascular: Negative.   Gastrointestinal: Negative.   Musculoskeletal: Negative.   Skin: Negative.   Neurological: Negative for dizziness, tingling and tremors.  Endo/Heme/Allergies: Negative.   Psychiatric/Behavioral: Negative.    Objective:  BP (!) 142/76   Pulse 64   Temp 98.1 F (36.7 C)   Resp 14   Wt 166 lb  (75.3 kg)   BMI 23.15 kg/m   Physical Exam  Constitutional: He is oriented to person, place, and time and well-developed, well-nourished, and in no distress.  HENT:  Head: Normocephalic and atraumatic.  Right Ear: External ear normal.  Left Ear: External ear normal.  Nose: Nose normal.  Mild chronic rhinophyma  Eyes: Conjunctivae are normal. Pupils are equal, round, and reactive to light.  Neck: No thyromegaly present.  Cardiovascular: Normal rate, regular rhythm, normal heart sounds and intact distal pulses.   No murmur heard. Pulmonary/Chest: Effort normal and breath sounds normal. No respiratory distress. He has no wheezes.  Abdominal: Soft.  Musculoskeletal: He exhibits no edema or tenderness.  Lymphadenopathy:    He has no cervical adenopathy.  Neurological: He is alert and oriented to person, place, and time.  Skin: Skin is warm and dry.  Psychiatric: Mood, memory, affect and judgment normal.    Assessment and Plan :  1. Depression, major, single episode, mild ([place]) [place]. Will follow. Wife died earlier this year and he is getting ready to move out of their long time house. He thinks this will help. Will follow unintentional weight loss. I think this is related to COPD/emphysema. Will get labs done on the next visit. More than 25 minutes total spent on visit in counseling and review of issues 2. Benign essential HTN Stable.  3. Chronic obstructive pulmonary disease, unspecified COPD type ([place]) Stable. Uses Oxygen at night time.  4. Need for influenza vaccination - Flu vaccine HIGH DOSE PF (Fluzone High dose) 5.Ischemic cardiomyopathy Stable. 6. Weight loss I think this was started with depression and COPD as makes it difficult for him to gain. This is stabilized. He says the past couple of months he has started gaining weight back HPI, Exam and A&P transcribed under direction and in the presence of Miguel Aschoff, MD.

## 2016-04-11 ENCOUNTER — Telehealth: Payer: Self-pay

## 2016-04-11 ENCOUNTER — Ambulatory Visit (INDEPENDENT_AMBULATORY_CARE_PROVIDER_SITE_OTHER): Payer: Medicare Other | Admitting: Family Medicine

## 2016-04-11 ENCOUNTER — Other Ambulatory Visit: Payer: Self-pay

## 2016-04-11 VITALS — BP 143/80 | HR 92 | Temp 98.6°F | Ht 70.5 in | Wt 164.2 lb

## 2016-04-11 DIAGNOSIS — Z0000 Encounter for general adult medical examination without abnormal findings: Secondary | ICD-10-CM | POA: Diagnosis not present

## 2016-04-11 MED ORDER — MIRTAZAPINE 30 MG PO TABS: 30.0000 mg | ORAL_TABLET | Freq: Every day | ORAL | 3 refills | Status: DC

## 2016-04-11 NOTE — Telephone Encounter (Signed)
Refill request received from [facility] requesting mirtazapine (REMERON) 30 MG tablet

## 2016-04-11 NOTE — Telephone Encounter (Signed)
Rx sent to pharmacy  ED 

## 2016-04-11 NOTE — Telephone Encounter (Signed)
Ok to rf for 1 year. 

## 2016-04-11 NOTE — Telephone Encounter (Signed)
Please review-aa 

## 2016-04-11 NOTE — Progress Notes (Signed)
Subjective:   Phillip Allison is a 75 y.o. male who presents for Medicare Annual/Subsequent preventive examination.  Review of Systems:  N/A    Cardiac Risk Factors include: advanced age (>71men, >62 women);dyslipidemia;hypertension;male gender His wife died early 10/10/15.    Objective:    Vitals: BP (!) 143/80 (BP Location: Right Arm)   Pulse 92   Temp 98.6 F (37 C)   Ht 5' 10.5" (1.791 m)   Wt 164 lb 4 oz (74.5 kg)   BMI 23.23 kg/m   Body mass index is 23.23 kg/m.  Tobacco History  Smoking Status  . Former Smoker  . Packs/day: 0.50  . Years: 50.00  . Types: Cigarettes  Smokeless Tobacco  . Never Used     Counseling given: No   Past Medical History:  Diagnosis Date  . CHF (congestive heart failure) ([place])   . COPD (chronic obstructive pulmonary disease) ([place])   . Depression   . Emphysema lung ([place])   . Hypercholesterolemia   . Hypertension   . Myocardial infarction   . On home oxygen therapy    2 liters at night and as needed  . Pneumonia    in the past  . Presence of permanent cardiac pacemaker   . Shortness of breath dyspnea    with exertion   Past Surgical History:  Procedure Laterality Date  . ABDOMINAL AORTIC ANEURYSM REPAIR    . ANGIOPLASTY     left lower extremity  . CATARACT EXTRACTION W/PHACO Right 12/18/2015   Procedure: CATARACT EXTRACTION PHACO AND INTRAOCULAR LENS PLACEMENT (IOC);  Surgeon: Estill Cotta, MD;  Location: [facility] ORS;  Service: Ophthalmology;  Laterality: Right;  [place] 01:58 AP% 23.5 CDE 50.91 fluid pack lot # [id number]  . INSERT / REPLACE / REMOVE PACEMAKER    . PACEMAKER PLACEMENT    . TONSILLECTOMY     Family History  Problem Relation Age of Onset  . Psoriasis Mother   . Heart attack Father    History  Sexual Activity  . Sexual activity: Not Currently    Outpatient Encounter Prescriptions as of 04/11/2016  Medication Sig  . aspirin 81 MG tablet Take 81 mg by mouth at bedtime.   . carvedilol (COREG) 12.5  MG tablet Take 12.5 mg by mouth 2 (two) times daily with a meal.   . furosemide (LASIX) 20 MG tablet Take 20 mg by mouth 2 (two) times daily.   Marland Kitchen lisinopril (PRINIVIL,ZESTRIL) 5 MG tablet Take 5 mg by mouth daily.   . OXYGEN Inhale into the lungs.  . pravastatin (PRAVACHOL) 40 MG tablet TAKE 1 TABLET BY MOUTH AT BEDTIME.  . mirtazapine (REMERON) 30 MG tablet TAKE 1 TABLET BY MOUTH ONCE DAILY (Patient not taking: Reported on 04/11/2016)   No facility-administered encounter medications on file as of 04/11/2016.     Activities of Daily Living In your present state of health, do you have any difficulty performing the following activities: 04/11/2016 04/11/2016  Hearing? Tempie Donning  Vision? N N  Difficulty concentrating or making decisions? N -  Walking or climbing stairs? Y -  Dressing or bathing? N -  Doing errands, shopping? N -  Preparing Food and eating ? N -  Using the Toilet? N -  In the past six months, have you accidently leaked urine? N -  Do you have problems with loss of bowel control? N -  Managing your Medications? N -  Managing your Finances? N -  Housekeeping or managing your Housekeeping? N -  Some recent data might be hidden    Patient Care Team: Jerrol Banana., MD as PCP - General (Family Medicine) Estill Cotta, MD as Consulting Physician (Ophthalmology) Corey Skains, MD as Consulting Physician (Cardiology) Algernon Huxley, MD as Referring Physician (Vascular Surgery)   Assessment:     Exercise Activities and Dietary recommendations Current Exercise Habits: The patient does not participate in regular exercise at present, Exercise limited by: None identified  Goals    . Increase water intake          Starting 04/11/16, I will increase my water intake to 3-4 glasses a day (8 oz).      Fall Risk Fall Risk  04/11/2016 03/13/2015  Falls in the past year? No No   Depression Screen PHQ 2/9 Scores 04/11/2016 03/13/2015  PHQ - 2 Score 0 2  PHQ- 9 Score - 3     Cognitive Function     6CIT Screen 04/11/2016  What Year? 0 points  What month? 0 points  What time? 0 points  Count back from 20 0 points  Months in reverse 0 points  Repeat phrase 0 points  Total Score 0    Immunization History  Administered Date(s) Administered  . Influenza, High Dose Seasonal PF 03/13/2015, 03/11/2016  . Influenza-Unspecified 02/22/2014  . Pneumococcal Conjugate-13 03/13/2015  . Pneumococcal Polysaccharide-23 04/18/2005, 04/23/2012  . Td 09/15/2003   Screening Tests Health Maintenance  Topic Date Due  . TETANUS/TDAP  04/11/2017 (Originally 09/14/2013)  . ZOSTAVAX  04/12/2027 (Originally 06/05/2002)  . COLONOSCOPY  04/11/2028 (Originally 06/05/1992)  . INFLUENZA VACCINE  Completed  . PNA vac Low Risk Adult  Completed   6CIT Screen 04/11/2016  What Year? 0 points  What month? 0 points  What time? 0 points  Count back from 20 0 points  Months in reverse 0 points  Repeat phrase 0 points  Total Score 0      Plan:    I have personally reviewed and addressed the Medicare Annual Wellness questionnaire and have noted the following in the patient's chart:  A. Medical and social history B. Use of alcohol, tobacco or illicit drugs  C. Current medications and supplements D. Functional ability and status E.  Nutritional status F.  Physical activity G. Advance directives H. List of other physicians I.  Hospitalizations, surgeries, and ER visits in previous 12 months J.  [place] such as hearing and vision if needed, cognitive and depression L. Referrals and appointments - none  In addition, I have reviewed and discussed with patient certain preventive protocols, quality metrics, and best practice recommendations. A written personalized care plan for preventive services as well as general preventive health recommendations were provided to patient.  See attached scanned questionnaire for additional information.   Signed,  Fabio Neighbors, LPN Nurse Health Advisor  I have reviewed the information as  put it by [facility] LPN and was available for any questions or concerns. Miguel Aschoff MD [facility]

## 2016-04-11 NOTE — Patient Instructions (Signed)
[name] , Thank you for taking time to come for your Medicare Wellness Visit. I appreciate your ongoing commitment to your health goals. Please review the following plan we discussed and let me know if I can assist you in the future.   These are the goals we discussed: Goals    . Increase water intake          Starting 04/11/16, I will increase my water intake to 3-4 glasses a day (8 oz).       This is a list of the screening recommended for you and due dates:  Health Maintenance  Topic Date Due  . Tetanus Vaccine  04/11/2017*  . Shingles Vaccine  04/12/2027*  . Colon Cancer Screening  04/11/2028*  . Flu Shot  Completed  . Pneumonia vaccines  Completed  *Topic was postponed. The date shown is not the original due date.   Preventive Care for Adults  A healthy lifestyle and preventive care can promote health and wellness. Preventive health guidelines for adults include the following key practices.  . A routine yearly physical is a good way to check with your health care provider about your health and preventive screening. It is a chance to share any concerns and updates on your health and to receive a thorough exam.  . Visit your dentist for a routine exam and preventive care every 6 months. Brush your teeth twice a day and floss once a day. Good oral hygiene prevents tooth decay and gum disease.  . The frequency of eye exams is based on your age, health, family medical history, use  of contact lenses, and other factors. Follow your health care provider's ecommendations for frequency of eye exams.  . Eat a healthy diet. Foods like vegetables, fruits, whole grains, low-fat dairy products, and lean protein foods contain the nutrients you need without too many calories. Decrease your intake of foods high in solid fats, added sugars, and salt. Eat the right amount of calories for you. Get information about a proper diet from your health care provider, if necessary.  . Regular  physical exercise is one of the most important things you can do for your health. Most adults should get at least 150 minutes of moderate-intensity exercise (any activity that increases your heart rate and causes you to sweat) each week. In addition, most adults need muscle-strengthening exercises on 2 or more days a week.  Silver Sneakers may be a benefit available to you. To determine eligibility, you may visit the website: www.silversneakers.com or contact program at (906) 099-6848 Mon-Fri between 8AM-8PM.   . Maintain a healthy weight. The body mass index (BMI) is a screening tool to identify possible weight problems. It provides an estimate of body fat based on height and weight. Your health care provider can find your BMI and can help you achieve or maintain a healthy weight.   For adults 20 years and older: ? A BMI below 18.5 is considered underweight. ? A BMI of 18.5 to 24.9 is normal. ? A BMI of 25 to 29.9 is considered overweight. ? A BMI of 30 and above is considered obese.   . Maintain normal blood lipids and cholesterol levels by exercising and minimizing your intake of saturated fat. Eat a balanced diet with plenty of fruit and vegetables. Blood tests for lipids and cholesterol should begin at age 60 and be repeated every 5 years. If your lipid or cholesterol levels are high, you are over 50, or you are at high risk  for heart disease, you may need your cholesterol levels checked more frequently. Ongoing high lipid and cholesterol levels should be treated with medicines if diet and exercise are not working.  . If you smoke, find out from your health care provider how to quit. If you do not use tobacco, please do not start.  . If you choose to drink alcohol, please do not consume more than 2 drinks per day. One drink is considered to be 12 ounces (355 mL) of beer, 5 ounces (148 mL) of wine, or 1.5 ounces (44 mL) of liquor.  . If you are 80-7 years old, ask your health care provider if  you should take aspirin to prevent strokes.  . Use sunscreen. Apply sunscreen liberally and repeatedly throughout the day. You should seek shade when your shadow is shorter than you. Protect yourself by wearing long sleeves, pants, a wide-brimmed hat, and sunglasses year round, whenever you are outdoors.  . Once a month, do a whole body skin exam, using a mirror to look at the skin on your back. Tell your health care provider of new moles, moles that have irregular borders, moles that are larger than a pencil eraser, or moles that have changed in shape or color.

## 2016-05-14 DIAGNOSIS — I714 Abdominal aortic aneurysm, without rupture: Secondary | ICD-10-CM | POA: Diagnosis not present

## 2016-05-14 DIAGNOSIS — E782 Mixed hyperlipidemia: Secondary | ICD-10-CM | POA: Diagnosis not present

## 2016-05-14 DIAGNOSIS — I519 Heart disease, unspecified: Secondary | ICD-10-CM | POA: Diagnosis not present

## 2016-05-14 DIAGNOSIS — I493 Ventricular premature depolarization: Secondary | ICD-10-CM | POA: Diagnosis not present

## 2016-05-14 DIAGNOSIS — I739 Peripheral vascular disease, unspecified: Secondary | ICD-10-CM | POA: Diagnosis not present

## 2016-05-14 DIAGNOSIS — I255 Ischemic cardiomyopathy: Secondary | ICD-10-CM | POA: Diagnosis not present

## 2016-05-14 DIAGNOSIS — I340 Nonrheumatic mitral (valve) insufficiency: Secondary | ICD-10-CM | POA: Diagnosis not present

## 2016-05-14 DIAGNOSIS — N189 Chronic kidney disease, unspecified: Secondary | ICD-10-CM | POA: Diagnosis not present

## 2016-05-14 DIAGNOSIS — I5022 Chronic systolic (congestive) heart failure: Secondary | ICD-10-CM | POA: Diagnosis not present

## 2016-05-14 DIAGNOSIS — I2510 Atherosclerotic heart disease of native coronary artery without angina pectoris: Secondary | ICD-10-CM | POA: Diagnosis not present

## 2016-05-14 DIAGNOSIS — I10 Essential (primary) hypertension: Secondary | ICD-10-CM | POA: Diagnosis not present

## 2016-05-14 DIAGNOSIS — I454 Nonspecific intraventricular block: Secondary | ICD-10-CM | POA: Diagnosis not present

## 2016-05-14 DIAGNOSIS — Z8679 Personal history of other diseases of the circulatory system: Secondary | ICD-10-CM | POA: Diagnosis not present

## 2016-05-23 DIAGNOSIS — H35341 Macular cyst, hole, or pseudohole, right eye: Secondary | ICD-10-CM | POA: Diagnosis not present

## 2016-06-14 DIAGNOSIS — H35341 Macular cyst, hole, or pseudohole, right eye: Secondary | ICD-10-CM | POA: Diagnosis not present

## 2016-07-15 ENCOUNTER — Encounter: Payer: Self-pay | Admitting: Family Medicine

## 2016-07-15 ENCOUNTER — Ambulatory Visit (INDEPENDENT_AMBULATORY_CARE_PROVIDER_SITE_OTHER): Payer: Medicare Other | Admitting: Family Medicine

## 2016-07-15 VITALS — BP 148/64 | HR 68 | Temp 97.5°F | Resp 18 | Wt 166.0 lb

## 2016-07-15 DIAGNOSIS — I255 Ischemic cardiomyopathy: Secondary | ICD-10-CM

## 2016-07-15 DIAGNOSIS — I10 Essential (primary) hypertension: Secondary | ICD-10-CM

## 2016-07-15 DIAGNOSIS — E7800 Pure hypercholesterolemia, unspecified: Secondary | ICD-10-CM | POA: Diagnosis not present

## 2016-07-15 DIAGNOSIS — J449 Chronic obstructive pulmonary disease, unspecified: Secondary | ICD-10-CM

## 2016-07-15 DIAGNOSIS — F419 Anxiety disorder, unspecified: Secondary | ICD-10-CM

## 2016-07-15 DIAGNOSIS — G4700 Insomnia, unspecified: Secondary | ICD-10-CM

## 2016-07-15 DIAGNOSIS — R739 Hyperglycemia, unspecified: Secondary | ICD-10-CM | POA: Diagnosis not present

## 2016-07-15 MED ORDER — PAROXETINE HCL 20 MG PO TABS: 20.0000 mg | ORAL_TABLET | Freq: Every day | ORAL | 3 refills | Status: DC

## 2016-07-15 NOTE — Progress Notes (Signed)
Subjective:  HPI  Hypertension, follow-up:  BP Readings from Last 3 Encounters:  07/15/16 (!) 148/64  04/11/16 (!) 143/80  03/11/16 (!) 142/76    He was last seen for hypertension 4 months ago.  BP at that visit was 143/80. Management since that visit includes none. He reports good compliance with treatment. He is not having side effects.  He is exercising.  He is adherent to low salt diet.   Outside blood pressures are not being checked. He is experiencing dyspnea.  Patient denies chest pain, chest pressure/discomfort, claudication, exertional chest pressure/discomfort, fatigue, near-syncope, orthopnea, palpitations, paroxysmal nocturnal dyspnea and syncope.   Cardiovascular risk factors include advanced age (older than 92 for men, 14 for women), dyslipidemia, hypertension and male gender.   Wt Readings from Last 3 Encounters:  07/15/16 166 lb (75.3 kg)  04/11/16 164 lb 4 oz (74.5 kg)  03/11/16 166 lb (75.3 kg)   ------------------------------------------------------------------------  Depression-  Pt reports that he is feeling better emotionally. He has moved out of his home he was living in with his wife.   COPD- Pt reports that he only uses his oxygen when walking around or sleeping.   Anxiety attacks- He and his son reports that pt has been having what they think is panic attacks. He will have several episodes of a hard time breathing, then it gets better with or without his oxygen. He has not had an episode in about a month and this has gotten better since moving in with his son. Son also reports that he is much more aggravated and agitated easier with any little thing. He is not sleeping well either. He has trouble fall asleep.   Prior to Admission medications   Medication Sig Start Date End Date Taking? Authorizing Provider  aspirin 81 MG tablet Take 81 mg by mouth at bedtime.  08/22/11   Historical Provider, MD  carvedilol (COREG) 12.5 MG tablet Take 12.5 mg by  mouth 2 (two) times daily with a meal.  08/22/11   Historical Provider, MD  furosemide (LASIX) 20 MG tablet Take 20 mg by mouth 2 (two) times daily.  08/22/11   Historical Provider, MD  lisinopril (PRINIVIL,ZESTRIL) 5 MG tablet Take 5 mg by mouth daily.  08/22/11   Historical Provider, MD  mirtazapine (REMERON) 30 MG tablet Take 1 tablet (30 mg total) by mouth daily. 04/11/16   Jahnaya Branscome Maceo Pro., MD  OXYGEN Inhale into the lungs.    Historical Provider, MD  pravastatin (PRAVACHOL) 40 MG tablet TAKE 1 TABLET BY MOUTH AT BEDTIME. 07/03/15   Jerrol Banana., MD    Patient Active Problem List   Diagnosis Date Noted  . COPD (chronic obstructive pulmonary disease) ([place]) 03/11/2016  . History of tobacco use 03/13/2015  . History of atrial fibrillation 03/13/2015  . Chronic systolic heart failure ([place]) 03/13/2015  . Intraventricular block 03/13/2015  . 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[place]) 03/10/2015  . Cardiovascular disease 03/10/2015  . Arteriosclerosis of coronary artery 03/10/2015  . CAFL (chronic airflow limitation) ([place]) 03/10/2015  . Chronic kidney disease (CKD), stage III (moderate) 03/10/2015  . Depression, major, single episode, mild ([place]) 03/10/2015  . 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 Acid reflux 03/10/2015  . Heart & renal disease, hypertensive, with heart failure ([place]) 03/10/2015  . Cardiomyopathy, ischemic 03/10/2015  . 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[place]) 03/10/2015  . Hypercholesterolemia without hypertriglyceridemia 03/10/2015  . Current smoker 03/10/2015  . Cardiac defibrillator in place 01/25/2015  . MI (  mitral incompetence) 11/01/2014  . Benign essential HTN 10/17/2014    Past Medical History:  Diagnosis Date  . CHF (congestive heart failure) ([place])   . COPD (chronic obstructive pulmonary disease) ([place])   . Depression   . Emphysema lung ([place])   . Hypercholesterolemia   . Hypertension   . Myocardial infarction   . On home oxygen therapy    2 liters at night  and as needed  . Pneumonia    in the past  . Presence of permanent cardiac pacemaker   . Shortness of breath dyspnea    with exertion    Social History   Social History  . Marital status: Widowed    Spouse name: N/A  . Number of children: N/A  . Years of education: N/A   Occupational History  . Not on file.   Social History Main Topics  . Smoking status: Former Smoker    Packs/day: 0.50    Years: 50.00    Types: Cigarettes  . Smokeless tobacco: Never Used  . Alcohol use No  . Drug use: No  . Sexual activity: Not Currently   Other Topics Concern  . Not on file   Social History Narrative  . No narrative on file    No Known Allergies  Review of Systems  Constitutional: Negative.   HENT: Negative.   Eyes: Negative.   Respiratory: Positive for shortness of breath.   Cardiovascular: Negative.   Gastrointestinal: Negative.   Genitourinary: Negative.   Musculoskeletal: Negative.   Skin: Negative.   Neurological: Negative.   Endo/Heme/Allergies: Negative.   Psychiatric/Behavioral: The patient is nervous/anxious.     Immunization History  Administered Date(s) Administered  . Influenza, High Dose Seasonal PF 03/13/2015, 03/11/2016  . Influenza-Unspecified 02/22/2014  . Pneumococcal Conjugate-13 03/13/2015  . Pneumococcal Polysaccharide-23 04/18/2005, 04/23/2012  . Td 09/15/2003    Objective:  BP (!) 148/64 (BP Location: Left Arm, Patient Position: Sitting, Cuff Size: Normal)   Pulse 68   Temp 97.5 F (36.4 C) (Oral)   Resp 18   Wt 166 lb (75.3 kg)   SpO2 90%   BMI 23.48 kg/m   Physical Exam  Constitutional: He is oriented to person, place, and time and well-developed, well-nourished, and in no distress.  HENT:  Head: Normocephalic and atraumatic.  Right Ear: External ear normal.  Left Ear: External ear normal.  Telangiectasia at the bulb of his nose  Eyes: Conjunctivae are normal. No scleral icterus.  Neck: No thyromegaly present.  Cardiovascular:  Normal rate, regular rhythm and normal heart sounds.   Pulmonary/Chest: Effort normal and breath sounds normal.  Neurological: He is alert and oriented to person, place, and time. Gait normal.  Skin: Skin is warm and dry.  Psychiatric: Mood, memory, affect and judgment normal.    Lab Results  Component Value Date   WBC 9.5 07/13/2015   HGB 14.4 01/24/2014   HCT 43.8 07/13/2015   PLT 221 07/13/2015   GLUCOSE 98 07/13/2015   CHOL 140 07/13/2015   TRIG 98 07/13/2015   HDL 32 (L) 07/13/2015   LDLCALC 88 07/13/2015   TSH 1.570 07/13/2015   INR 1.1 07/21/2012   HGBA1C 6.0 08/14/2011    CMP     Component Value Date/Time   NA 143 07/13/2015 1206   NA 138 07/21/2012 1129   K 4.8 07/13/2015 1206   K 4.4 07/21/2012 1129   CL 101 07/13/2015 1206   CL 105 07/21/2012 1129   CO2 25 07/13/2015 1206  CO2 27 07/21/2012 1129   GLUCOSE 98 07/13/2015 1206   GLUCOSE 99 07/21/2012 1129   BUN 47 (H) 07/13/2015 1206   BUN 32 (H) 07/21/2012 1129   CREATININE 2.18 (H) 07/13/2015 1206   CREATININE 1.73 (H) 07/21/2012 1129   CALCIUM 9.7 07/13/2015 1206   CALCIUM 9.0 07/21/2012 1129   PROT 7.1 07/13/2015 1206   PROT 5.8 (L) 12/19/2011 0506   ALBUMIN 4.5 07/13/2015 1206   ALBUMIN 2.9 (L) 12/19/2011 0506   AST 17 07/13/2015 1206   AST 15 12/19/2011 0506   ALT 11 07/13/2015 1206   ALT 11 (L) 12/19/2011 0506   ALKPHOS 80 07/13/2015 1206   ALKPHOS 76 12/19/2011 0506   BILITOT 0.4 07/13/2015 1206   BILITOT 0.6 12/19/2011 0506   GFRNONAA 29 (L) 07/13/2015 1206   GFRNONAA 39 (L) 07/21/2012 1129   GFRAA 33 (L) 07/13/2015 1206   GFRAA 45 (L) 07/21/2012 1129    Assessment and Plan :  1. Benign essential HTN  - CBC with Differential/Platelet - TSH  2. Cardiomyopathy, ischemic All risk factors treated.  3. Chronic obstructive pulmonary disease, unspecified COPD type ([place]) Patient has completely quit smoking.  4. Anxiety Start Paroxetine for anxiety and sleep. - PARoxetine (PAXIL) 20  MG tablet; Take 1 tablet (20 mg total) by mouth at bedtime.  Dispense: 90 tablet; Refill: 3 Return to clinic 3-4 months to reassess. I think patient is doing better since he moved in with his son. 5. Hypercholesterolemia without hypertriglyceridemia  - Lipid Panel With LDL/HDL Ratio - Comprehensive metabolic panel  6. Insomnia, unspecified type  - PARoxetine (PAXIL) 20 MG tablet; Take 1 tablet (20 mg total) by mouth at bedtime.  Dispense: 90 tablet; Refill: 3  7. Hyperglycemia  - Hemoglobin A1c   HPI, Exam, and A&P Transcribed under the direction and in the presence of Marquist Binstock L. Cranford Mon, MD  Electronically Signed: Katina Dung, CMA I have done the exam and reviewed the above chart and it is accurate to the best of my knowledge. Development worker, community has been used in this note in any air is in the dictation or transcription are unintentional.  [facility] 07/15/2016 11:09 AM

## 2016-07-16 LAB — CBC WITH DIFFERENTIAL/PLATELET
BASOS ABS: 0.1 10*3/uL (ref 0.0–0.2)
Basos: 1 %
EOS (ABSOLUTE): 0.4 10*3/uL (ref 0.0–0.4)
Eos: 4 %
HEMATOCRIT: 40.8 % (ref 37.5–51.0)
Hemoglobin: 13.4 g/dL (ref 13.0–17.7)
Immature Grans (Abs): 0 10*3/uL (ref 0.0–0.1)
Immature Granulocytes: 0 %
Lymphocytes Absolute: 2.6 10*3/uL (ref 0.7–3.1)
Lymphs: 28 %
MCH: 29.1 pg (ref 26.6–33.0)
MCHC: 32.8 g/dL (ref 31.5–35.7)
MCV: 89 fL (ref 79–97)
MONOS ABS: 0.6 10*3/uL (ref 0.1–0.9)
Monocytes: 7 %
Neutrophils Absolute: 5.6 10*3/uL (ref 1.4–7.0)
Neutrophils: 60 %
PLATELETS: 220 10*3/uL (ref 150–379)
RBC: 4.61 x10E6/uL (ref 4.14–5.80)
RDW: 13.4 % (ref 12.3–15.4)
WBC: 9.4 10*3/uL (ref 3.4–10.8)

## 2016-07-16 LAB — LIPID PANEL WITH LDL/HDL RATIO
Cholesterol, Total: 144 mg/dL (ref 100–199)
HDL: 35 mg/dL — ABNORMAL LOW (ref >39)
LDL Calculated: 90 mg/dL (ref 0–99)
LDl/HDL Ratio: 2.6 ratio units (ref 0.0–3.6)
Triglycerides: 93 mg/dL (ref 0–149)
VLDL CHOLESTEROL CAL: 19 mg/dL (ref 5–40)

## 2016-07-16 LAB — COMPREHENSIVE METABOLIC PANEL
ALT: 8 IU/L (ref 0–44)
AST: 13 IU/L (ref 0–40)
Albumin/Globulin Ratio: 1.6 (ref 1.2–2.2)
Albumin: 4.1 g/dL (ref 3.5–4.8)
Alkaline Phosphatase: 72 IU/L (ref 39–117)
BUN/Creatinine Ratio: 18 (ref 10–24)
BUN: 29 mg/dL — ABNORMAL HIGH (ref 8–27)
Bilirubin Total: 0.4 mg/dL (ref 0.0–1.2)
CALCIUM: 9.1 mg/dL (ref 8.6–10.2)
CO2: 28 mmol/L (ref 18–29)
CREATININE: 1.62 mg/dL — AB (ref 0.76–1.27)
Chloride: 98 mmol/L (ref 96–106)
GFR calc Af Amer: 48 mL/min/{1.73_m2} — ABNORMAL LOW (ref >59)
GFR, EST NON AFRICAN AMERICAN: 41 mL/min/{1.73_m2} — AB (ref >59)
GLOBULIN, TOTAL: 2.5 g/dL (ref 1.5–4.5)
Glucose: 99 mg/dL (ref 65–99)
Potassium: 4.9 mmol/L (ref 3.5–5.2)
SODIUM: 141 mmol/L (ref 134–144)
Total Protein: 6.6 g/dL (ref 6.0–8.5)

## 2016-07-16 LAB — HEMOGLOBIN A1C
ESTIMATED AVERAGE GLUCOSE: 103 mg/dL
Hgb A1c MFr Bld: 5.2 % (ref 4.8–5.6)

## 2016-07-16 LAB — TSH: TSH: 1.36 u[IU]/mL (ref 0.450–4.500)

## 2016-07-17 ENCOUNTER — Telehealth: Payer: Self-pay

## 2016-07-17 NOTE — Telephone Encounter (Signed)
There was a fax on this yesterday I believe for PA, did you want to try a different medication before starting PA?-aa

## 2016-07-17 NOTE — Telephone Encounter (Signed)
Stop Mirtazapine and start PA for Paxil.

## 2016-07-17 NOTE — Telephone Encounter (Signed)
Patient states he went to [facility] pharmacy to get PARoxetine (PAXIL) 20 MG tablet and his insurance denied paying due to patient's age. He said pharmacy will be faxing a letter to [place]. He would like a call back regarding this and if Dr. Rosanna Randy would like to change the RX.

## 2016-07-17 NOTE — Telephone Encounter (Signed)
Working on [place] for Paroxetine now.-aa

## 2016-07-17 NOTE — Telephone Encounter (Signed)
Just FYI. Would you like to change patient's prescription?

## 2016-07-18 ENCOUNTER — Telehealth: Payer: Self-pay | Admitting: Family Medicine

## 2016-07-18 NOTE — Telephone Encounter (Signed)
Phillip Allison with [name] is requesting a call back to discuss a prior [place].  She is needing to know if the benefits for the high risk medication out way the risk and has this been discussed with the pt.  CB#[phone] #5/MW

## 2016-07-19 NOTE — Telephone Encounter (Signed)
Called patient and it went straight to voicemail, but voicemail is not set up yet. Could not leave a message, waiting for response from PA on medication-aa

## 2016-07-19 NOTE — Telephone Encounter (Signed)
[employer] called and states Paroxetine was denied because it is not on his formulary and he has to fail at least 2 medications that are on his formulary: Citalopram, Sertraline, Fluoxetine and Escitalopram.-aa

## 2016-07-19 NOTE — Telephone Encounter (Signed)
Just received a fax from [facility] saying that Paroxetine is covered through 2019. Will try to call patient back again later-aa

## 2016-07-19 NOTE — Telephone Encounter (Signed)
Talked to [facility] today-aa

## 2016-07-19 NOTE — Telephone Encounter (Signed)
Spoke with pharmacist and she ran medication through the insurance and it is covered. Patient advised-aa

## 2016-08-01 DIAGNOSIS — I739 Peripheral vascular disease, unspecified: Secondary | ICD-10-CM | POA: Diagnosis not present

## 2016-08-01 DIAGNOSIS — I5022 Chronic systolic (congestive) heart failure: Secondary | ICD-10-CM | POA: Diagnosis not present

## 2016-08-01 DIAGNOSIS — R002 Palpitations: Secondary | ICD-10-CM | POA: Diagnosis not present

## 2016-08-01 DIAGNOSIS — R0602 Shortness of breath: Secondary | ICD-10-CM | POA: Diagnosis not present

## 2016-08-01 DIAGNOSIS — I519 Heart disease, unspecified: Secondary | ICD-10-CM | POA: Diagnosis not present

## 2016-10-01 ENCOUNTER — Other Ambulatory Visit: Payer: Self-pay | Admitting: Family Medicine

## 2016-10-22 DIAGNOSIS — I519 Heart disease, unspecified: Secondary | ICD-10-CM | POA: Diagnosis not present

## 2016-11-12 ENCOUNTER — Ambulatory Visit (INDEPENDENT_AMBULATORY_CARE_PROVIDER_SITE_OTHER): Payer: Medicare Other | Admitting: Family Medicine

## 2016-11-12 ENCOUNTER — Encounter: Payer: Self-pay | Admitting: Family Medicine

## 2016-11-12 VITALS — BP 172/72 | HR 82 | Temp 98.3°F | Resp 24 | Wt 171.0 lb

## 2016-11-12 DIAGNOSIS — I10 Essential (primary) hypertension: Secondary | ICD-10-CM

## 2016-11-12 DIAGNOSIS — Z87891 Personal history of nicotine dependence: Secondary | ICD-10-CM

## 2016-11-12 DIAGNOSIS — I255 Ischemic cardiomyopathy: Secondary | ICD-10-CM

## 2016-11-12 DIAGNOSIS — J449 Chronic obstructive pulmonary disease, unspecified: Secondary | ICD-10-CM

## 2016-11-12 NOTE — Progress Notes (Signed)
Patient: Phillip Allison Male    DOB: [birth year]   76 y.o.   MRN: [id number] Visit Date: 11/12/2016  Today's Provider: Wilhemena Durie, MD   Chief Complaint  Patient presents with  . Anxiety   Subjective:    HPI Patient comes in today for a follow up. He was last seen in the office 4 months ago. During that visit, Paroxetine 20mg  was added. Patient reports that his has helped some. He has not had any side effects.     No Known Allergies   Current Outpatient Prescriptions:  .  aspirin 81 MG tablet, Take 81 mg by mouth at bedtime. , Disp: , Rfl:  .  carvedilol (COREG) 12.5 MG tablet, Take 12.5 mg by mouth 2 (two) times daily with a meal. , Disp: , Rfl:  .  furosemide (LASIX) 20 MG tablet, Take 20 mg by mouth daily. , Disp: , Rfl:  .  lisinopril (PRINIVIL,ZESTRIL) 5 MG tablet, Take 5 mg by mouth daily. , Disp: , Rfl:  .  OXYGEN, Inhale into the lungs., Disp: , Rfl:  .  PARoxetine (PAXIL) 20 MG tablet, Take 1 tablet (20 mg total) by mouth at bedtime., Disp: 90 tablet, Rfl: 3 .  pravastatin (PRAVACHOL) 40 MG tablet, TAKE 1 TABLET BY MOUTH AT BEDTIME, Disp: 90 tablet, Rfl: 3 .  mirtazapine (REMERON) 30 MG tablet, Take 1 tablet (30 mg total) by mouth daily. (Patient not taking: Reported on 07/15/2016), Disp: 90 tablet, Rfl: 3  Review of Systems  Constitutional: Negative.   HENT: Negative.   Eyes: Negative.   Respiratory: Positive for shortness of breath.   Cardiovascular: Negative.   Endocrine: Negative.   Musculoskeletal: Negative.   Allergic/Immunologic: Negative.   Neurological: Negative.   Hematological: Negative.   Psychiatric/Behavioral: Negative.     Social History  Substance Use Topics  . Smoking status: Former Smoker    Packs/day: 0.50    Years: 50.00    Types: Cigarettes  . Smokeless tobacco: Never Used  . Alcohol use No   Objective:   BP (!) 172/72 (BP Location: Right Arm, Patient Position: Sitting, Cuff Size: Normal)   Pulse 82   Temp 98.3  F (36.8 C)   Resp (!) 24   Wt 171 lb (77.6 kg)   SpO2 93% Comment: with 2L of O2  BMI 24.19 kg/m  Vitals:   11/12/16 1624  BP: (!) 172/72  Pulse: 82  Resp: (!) 24  Temp: 98.3 F (36.8 C)  SpO2: 93%  Weight: 171 lb (77.6 kg)     Physical Exam  Constitutional: He is oriented to person, place, and time. He appears well-developed and well-nourished.  HENT:  Head: Normocephalic and atraumatic.  Right Ear: External ear normal.  Left Ear: External ear normal.  Nose: Nose normal.  Eyes: Conjunctivae are normal. No scleral icterus.  Neck: No thyromegaly present.  Cardiovascular: Normal rate, regular rhythm and normal heart sounds.   Pulmonary/Chest: Effort normal and breath sounds normal.  Abdominal: Soft.  Neurological: He is alert and oriented to person, place, and time.  Skin: Skin is warm and dry.  Psychiatric: He has a normal mood and affect. His behavior is normal. Judgment and thought content normal.        Assessment & Plan:     1. Chronic obstructive pulmonary disease, unspecified COPD type ([place])   2. History of tobacco use   3. Benign essential HTN 4.Ischemic Cardiomyopathy Stable.  I have done the exam and reviewed the above chart and it is accurate to the best of my knowledge. Development worker, community has been used in this note in any air is in the dictation or transcription are unintentional.  Wilhemena Durie, MD  [place]

## 2016-11-12 NOTE — Patient Instructions (Signed)
Try to stay active as possible.

## 2017-01-07 ENCOUNTER — Other Ambulatory Visit (INDEPENDENT_AMBULATORY_CARE_PROVIDER_SITE_OTHER): Payer: Self-pay

## 2017-01-07 ENCOUNTER — Encounter (INDEPENDENT_AMBULATORY_CARE_PROVIDER_SITE_OTHER): Payer: Self-pay

## 2017-01-07 ENCOUNTER — Ambulatory Visit (INDEPENDENT_AMBULATORY_CARE_PROVIDER_SITE_OTHER): Payer: Self-pay | Admitting: Vascular Surgery

## 2017-02-06 DIAGNOSIS — I519 Heart disease, unspecified: Secondary | ICD-10-CM | POA: Diagnosis not present

## 2017-03-18 ENCOUNTER — Ambulatory Visit (INDEPENDENT_AMBULATORY_CARE_PROVIDER_SITE_OTHER): Payer: Medicare Other | Admitting: Family Medicine

## 2017-03-18 ENCOUNTER — Telehealth: Payer: Self-pay | Admitting: Emergency Medicine

## 2017-03-18 VITALS — BP 174/70 | HR 68 | Temp 97.8°F | Wt 177.0 lb

## 2017-03-18 DIAGNOSIS — I2510 Atherosclerotic heart disease of native coronary artery without angina pectoris: Secondary | ICD-10-CM | POA: Diagnosis not present

## 2017-03-18 DIAGNOSIS — I2589 Other forms of chronic ischemic heart disease: Secondary | ICD-10-CM

## 2017-03-18 DIAGNOSIS — I255 Ischemic cardiomyopathy: Secondary | ICD-10-CM

## 2017-03-18 DIAGNOSIS — J449 Chronic obstructive pulmonary disease, unspecified: Secondary | ICD-10-CM | POA: Diagnosis not present

## 2017-03-18 MED ORDER — ALBUTEROL SULFATE (2.5 MG/3ML) 0.083% IN NEBU: 2.5000 mg | INHALATION_SOLUTION | Freq: Four times a day (QID) | RESPIRATORY_TRACT | 11 refills | Status: DC | PRN

## 2017-03-18 NOTE — Progress Notes (Signed)
RETT STEHLIK  MRN: [id number] DOB: [birth year]  Subjective:  HPI   The patient is a 76 year old male who present because of respiratory symptoms.  He is typically on 2 LPM of oxygen daily.  He states that recently he notices that when he exerts himself or especially when he gets anxious he has more difficulty breathing.  He states that he is able to dit and slow his breathing and relax and things get better.    When the patient first came back to the exam room he was not using his oxygen in an effort to make sure he had enough to get home.  His PO2 was 90 % on room air.  After a few minutes of being back on oxygen his O2 level went to 96%  Patient Active Problem List   Diagnosis Date Noted  . COPD (chronic obstructive pulmonary disease) ([place]) 03/11/2016  . History of tobacco use 03/13/2015  . History of atrial fibrillation 03/13/2015  . Chronic systolic heart failure ([place]) 03/13/2015  . Intraventricular block 03/13/2015  . 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[place]) 03/10/2015  . Cardiovascular disease 03/10/2015  . Arteriosclerosis of coronary artery 03/10/2015  . CAFL (chronic airflow limitation) ([place]) 03/10/2015  . Chronic kidney disease (CKD), stage III (moderate) 03/10/2015  . Depression, major, single episode, mild ([place]) 03/10/2015  . 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 Acid reflux 03/10/2015  . Heart & renal disease, hypertensive, with heart failure ([place]) 03/10/2015  . Cardiomyopathy, ischemic 03/10/2015  . 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[place]) 03/10/2015  . Hypercholesterolemia without hypertriglyceridemia 03/10/2015  . Current smoker 03/10/2015  . Cardiac defibrillator in place 01/25/2015  . MI (mitral incompetence) 11/01/2014  . Benign essential HTN 10/17/2014    Past Medical History:  Diagnosis Date  . CHF (congestive heart failure) ([place])   . COPD (chronic obstructive pulmonary disease) ([place])   . Depression   . Emphysema lung ([place])   . Hypercholesterolemia   .  Hypertension   . Myocardial infarction ([place])   . On home oxygen therapy    2 liters at night and as needed  . Pneumonia    in the past  . Presence of permanent cardiac pacemaker   . Shortness of breath dyspnea    with exertion    Social History   Social History  . Marital status: Widowed    Spouse name: N/A  . Number of children: N/A  . Years of education: N/A   Occupational History  . Not on file.   Social History Main Topics  . Smoking status: Former Smoker    Packs/day: 0.50    Years: 50.00    Types: Cigarettes  . Smokeless tobacco: Never Used  . Alcohol use No  . Drug use: No  . Sexual activity: Not Currently   Other Topics Concern  . Not on file   Social History Narrative  . No narrative on file    Outpatient Encounter Prescriptions as of 03/18/2017  Medication Sig  . aspirin 81 MG tablet Take 81 mg by mouth at bedtime.   . carvedilol (COREG) 12.5 MG tablet Take 12.5 mg by mouth 2 (two) times daily with a meal.   . furosemide (LASIX) 20 MG tablet Take 20 mg by mouth daily.   Marland Kitchen lisinopril (PRINIVIL,ZESTRIL) 5 MG tablet Take 5 mg by mouth daily.   . OXYGEN Inhale into the lungs.  Marland Kitchen PARoxetine (PAXIL) 20 MG tablet Take 1 tablet (20 mg total) by mouth at bedtime.  Marland Kitchen  pravastatin (PRAVACHOL) 40 MG tablet TAKE 1 TABLET BY MOUTH AT BEDTIME  . [DISCONTINUED] mirtazapine (REMERON) 30 MG tablet Take 1 tablet (30 mg total) by mouth daily. (Patient not taking: Reported on 07/15/2016)   No facility-administered encounter medications on file as of 03/18/2017.     No Known Allergies  Review of Systems  Constitutional: Positive for malaise/fatigue. Negative for chills and fever.  Respiratory: Positive for shortness of breath (chronic). Negative for cough. Sputum production: clear, coughs up clear phlegm about once per day,   Cardiovascular: Negative for chest pain, palpitations, orthopnea, claudication and leg swelling.  Neurological: Negative for weakness.    Endo/Heme/Allergies: Negative.   Psychiatric/Behavioral: Negative.     Objective:  BP (!) 174/70 (BP Location: Left Arm, Patient Position: Sitting, Cuff Size: Normal)   Pulse 68   Temp 97.8 F (36.6 C) (Oral)   Wt 177 lb (80.3 kg)   SpO2 96%   BMI 25.04 kg/m   Physical Exam  Constitutional: He is oriented to person, place, and time and well-developed, well-nourished, and in no distress.  HENT:  Head: Normocephalic and atraumatic.  Right Ear: External ear normal.  Left Ear: External ear normal.  Nose: Nose normal.  Eyes: Conjunctivae are normal. No scleral icterus.  Neck: No thyromegaly present.  Cardiovascular: Normal rate, regular rhythm and normal heart sounds.   Pulmonary/Chest: Effort normal.  Neurological: He is alert and oriented to person, place, and time. GCS score is 15.  Skin: Skin is warm and dry.  Psychiatric: Mood, memory, affect and judgment normal.    Assessment and Plan :    COPD  at this time due to dyspnea at home will try albuterol nebulizer solution every 6 hours when necessary. Recheck in 3-4 months. Patient notices slow progression of the symptoms  CAD  CK D  major depression  in remission  hypertension  hyperlipidemia  I have done the exam and reviewed the chart and it is accurate to the best of my knowledge. Development worker, community has been used and  any errors in dictation or transcription are unintentional. Miguel Aschoff M.D. [facility]

## 2017-03-18 NOTE — Telephone Encounter (Signed)
I put in an order for pt to get a nebulizer through [facility]. I do not know if you can see it. Let me know if there is anything else I needed to do. Thanks.

## 2017-03-19 NOTE — Telephone Encounter (Signed)
J44.9. COPD. Patient is getting oxygen through [facility] and that is why we wanted to try and get this done through them also. Thank Edrick Kins, RMA

## 2017-03-19 NOTE — Telephone Encounter (Signed)
I will need diagnosis and ICD 10 code,Thanks

## 2017-03-20 NOTE — Telephone Encounter (Signed)
Order for nebulizer faxed to [facility]

## 2017-04-01 DIAGNOSIS — Z23 Encounter for immunization: Secondary | ICD-10-CM | POA: Diagnosis not present

## 2017-05-13 DIAGNOSIS — I519 Heart disease, unspecified: Secondary | ICD-10-CM | POA: Diagnosis not present

## 2017-05-22 NOTE — Progress Notes (Signed)
Error. -MM

## 2017-07-01 ENCOUNTER — Ambulatory Visit (INDEPENDENT_AMBULATORY_CARE_PROVIDER_SITE_OTHER): Payer: Medicare Other | Admitting: Family Medicine

## 2017-07-01 VITALS — BP 168/84 | HR 70 | Temp 97.5°F | Resp 16 | Wt 179.0 lb

## 2017-07-01 DIAGNOSIS — I2510 Atherosclerotic heart disease of native coronary artery without angina pectoris: Secondary | ICD-10-CM

## 2017-07-01 DIAGNOSIS — E7800 Pure hypercholesterolemia, unspecified: Secondary | ICD-10-CM

## 2017-07-01 DIAGNOSIS — F320 Major depressive disorder, single episode, mild: Secondary | ICD-10-CM | POA: Diagnosis not present

## 2017-07-01 DIAGNOSIS — J449 Chronic obstructive pulmonary disease, unspecified: Secondary | ICD-10-CM | POA: Diagnosis not present

## 2017-07-01 DIAGNOSIS — I255 Ischemic cardiomyopathy: Secondary | ICD-10-CM

## 2017-07-01 DIAGNOSIS — Z87891 Personal history of nicotine dependence: Secondary | ICD-10-CM | POA: Diagnosis not present

## 2017-07-01 DIAGNOSIS — I10 Essential (primary) hypertension: Secondary | ICD-10-CM | POA: Diagnosis not present

## 2017-07-01 DIAGNOSIS — I130 Hypertensive heart and chronic kidney disease with heart failure and stage 1 through stage 4 chronic kidney disease, or unspecified chronic kidney disease: Secondary | ICD-10-CM | POA: Diagnosis not present

## 2017-07-01 MED ORDER — LISINOPRIL 10 MG PO TABS: 10.0000 mg | ORAL_TABLET | Freq: Every day | ORAL | 3 refills | Status: DC

## 2017-07-01 NOTE — Patient Instructions (Signed)
Double up your lisinopril to 10 mg daily. Then pick the 10 mg prescription at the pharmacy.

## 2017-07-01 NOTE — Progress Notes (Signed)
Patient: Phillip Allison Male    DOB: [birth year]   77 y.o.   MRN: [id number] Visit Date: 07/01/2017  Today's Provider: Wilhemena Durie, MD   Chief Complaint  Patient presents with  . COPD  . Hypertension  . Depression   Subjective:    HPI   COPD- Pt reports that his breathing is stable and he has good days and bad days.    Hypertension, follow-up:  BP Readings from Last 3 Encounters:  07/01/17 (!) 168/84  03/18/17 (!) 174/70  11/12/16 (!) 172/72   He was last seen for hypertension 4 months ago.  BP at that visit was 174/70. Management since that visit includes none. He reports good compliance with treatment. He is not having side effects.  He is exercising. He is adherent to low salt diet.   Outside blood pressures are not being checked. Patient denies chest pain, chest pressure/discomfort, claudication, exertional chest pressure/discomfort, fatigue, irregular heart beat, lower extremity edema, near-syncope, orthopnea, palpitations, paroxysmal nocturnal dyspnea, syncope and tachypnea.  .    Wt Readings from Last 3 Encounters:  07/01/17 179 lb (81.2 kg)  03/18/17 177 lb (80.3 kg)  11/12/16 171 lb (77.6 kg)   ------------------------------------------------------------------------ Pt declines Colonoscopy     No Known Allergies   Current Outpatient Medications:  .  albuterol (PROVENTIL) (2.5 MG/3ML) 0.083% nebulizer solution, Take 3 mLs (2.5 mg total) by nebulization every 6 (six) hours as needed for wheezing or shortness of breath., Disp: 150 mL, Rfl: 11 .  aspirin 81 MG tablet, Take 81 mg by mouth at bedtime. , Disp: , Rfl:  .  carvedilol (COREG) 12.5 MG tablet, Take 12.5 mg by mouth 2 (two) times daily with a meal. , Disp: , Rfl:  .  furosemide (LASIX) 20 MG tablet, Take 20 mg by mouth daily. , Disp: , Rfl:  .  lisinopril (PRINIVIL,ZESTRIL) 10 MG tablet, Take 1 tablet (10 mg total) by mouth daily., Disp: 90 tablet, Rfl: 3 .  OXYGEN, Inhale into  the lungs., Disp: , Rfl:  .  PARoxetine (PAXIL) 20 MG tablet, Take 1 tablet (20 mg total) by mouth at bedtime., Disp: 90 tablet, Rfl: 3 .  pravastatin (PRAVACHOL) 40 MG tablet, TAKE 1 TABLET BY MOUTH AT BEDTIME, Disp: 90 tablet, Rfl: 3  Review of Systems  Constitutional: Negative.   HENT: Negative.   Eyes: Negative.   Respiratory: Positive for shortness of breath (nothing worse than normal).   Cardiovascular: Negative.   Gastrointestinal: Negative.   Endocrine: Negative.   Genitourinary: Negative.   Musculoskeletal: Negative.   Skin: Negative.   Allergic/Immunologic: Negative.   Neurological: Negative.   Hematological: Negative.   Psychiatric/Behavioral: Negative.     Social History   Tobacco Use  . Smoking status: Former Smoker    Packs/day: 0.50    Years: 50.00    Pack years: 25.00    Types: Cigarettes  . Smokeless tobacco: Never Used  Substance Use Topics  . Alcohol use: No   Objective:   BP (!) 168/84 (BP Location: Left Arm, Patient Position: Sitting, Cuff Size: Normal)   Pulse 70   Temp (!) 97.5 F (36.4 C) (Oral)   Resp 16   Wt 179 lb (81.2 kg)   SpO2 95%   BMI 25.32 kg/m  Vitals:   07/01/17 1349  BP: (!) 168/84  Pulse: 70  Resp: 16  Temp: (!) 97.5 F (36.4 C)  TempSrc: Oral  SpO2: 95%  Weight: 179 lb (  81.2 kg)     Physical Exam  Constitutional: He is oriented to person, place, and time. He appears well-developed and well-nourished.  Eyes: Conjunctivae and EOM are normal. Pupils are equal, round, and reactive to light.  Neck: Normal range of motion. Neck supple.  Cardiovascular: Normal rate, regular rhythm, normal heart sounds and intact distal pulses.  Pulmonary/Chest: Effort normal and breath sounds normal.  Musculoskeletal: Normal range of motion.  Neurological: He is alert and oriented to person, place, and time. He has normal reflexes.  Skin: Skin is warm and dry.  Psychiatric: He has a normal mood and affect. His behavior is normal.  Judgment and thought content normal.        Assessment & Plan:     1. Benign essential HTN Elevated today increase lisnopril to 10 mg. Pt due for AWE in March, if still elevated at that time will increase to 20 mg daily. Follow up in 5-6 months.  - CBC with Differential/Platelet - TSH - lisinopril (PRINIVIL,ZESTRIL) 10 MG tablet; Take 1 tablet (10 mg total) by mouth daily.  Dispense: 90 tablet; Refill: 3  2. Chronic obstructive pulmonary disease, unspecified COPD type ([place]) Stable.  3. Hypercholesterolemia without hypertriglyceridemia  - Lipid panel - Comprehensive metabolic panel  4. Depression, major, single episode, mild (HCC) Stable. 5.Ischemic Cardiomypathy     HPI, Exam, and A&P Transcribed under the direction and in the presence of Richard L. Cranford Mon, MD  Electronically Signed: Katina Dung, [place], MD  [facility]

## 2017-07-02 LAB — LIPID PANEL
CHOL/HDL RATIO: 3.2 ratio (ref 0.0–5.0)
Cholesterol, Total: 135 mg/dL (ref 100–199)
HDL: 42 mg/dL (ref >39)
LDL CALC: 76 mg/dL (ref 0–99)
TRIGLYCERIDES: 84 mg/dL (ref 0–149)
VLDL CHOLESTEROL CAL: 17 mg/dL (ref 5–40)

## 2017-07-02 LAB — COMPREHENSIVE METABOLIC PANEL
A/G RATIO: 1.8 (ref 1.2–2.2)
ALK PHOS: 73 IU/L (ref 39–117)
ALT: 10 IU/L (ref 0–44)
AST: 13 IU/L (ref 0–40)
Albumin: 4.1 g/dL (ref 3.5–4.8)
BILIRUBIN TOTAL: 0.3 mg/dL (ref 0.0–1.2)
BUN / CREAT RATIO: 16 (ref 10–24)
BUN: 31 mg/dL — ABNORMAL HIGH (ref 8–27)
CHLORIDE: 104 mmol/L (ref 96–106)
CO2: 26 mmol/L (ref 20–29)
CREATININE: 1.88 mg/dL — AB (ref 0.76–1.27)
Calcium: 9.1 mg/dL (ref 8.6–10.2)
GFR calc Af Amer: 40 mL/min/{1.73_m2} — ABNORMAL LOW (ref >59)
GFR calc non Af Amer: 34 mL/min/{1.73_m2} — ABNORMAL LOW (ref >59)
GLOBULIN, TOTAL: 2.3 g/dL (ref 1.5–4.5)
Glucose: 91 mg/dL (ref 65–99)
POTASSIUM: 4.8 mmol/L (ref 3.5–5.2)
SODIUM: 144 mmol/L (ref 134–144)
Total Protein: 6.4 g/dL (ref 6.0–8.5)

## 2017-07-02 LAB — TSH: TSH: 1.38 u[IU]/mL (ref 0.450–4.500)

## 2017-07-02 LAB — CBC WITH DIFFERENTIAL/PLATELET
Basophils Absolute: 0 10*3/uL (ref 0.0–0.2)
Basos: 0 %
EOS (ABSOLUTE): 0.3 10*3/uL (ref 0.0–0.4)
EOS: 4 %
HEMATOCRIT: 39.3 % (ref 37.5–51.0)
HEMOGLOBIN: 12.6 g/dL — AB (ref 13.0–17.7)
IMMATURE GRANS (ABS): 0 10*3/uL (ref 0.0–0.1)
Immature Granulocytes: 0 %
LYMPHS ABS: 2 10*3/uL (ref 0.7–3.1)
Lymphs: 25 %
MCH: 29 pg (ref 26.6–33.0)
MCHC: 32.1 g/dL (ref 31.5–35.7)
MCV: 91 fL (ref 79–97)
MONOCYTES: 8 %
Monocytes Absolute: 0.7 10*3/uL (ref 0.1–0.9)
NEUTROS ABS: 5 10*3/uL (ref 1.4–7.0)
Neutrophils: 63 %
Platelets: 171 10*3/uL (ref 150–379)
RBC: 4.34 x10E6/uL (ref 4.14–5.80)
RDW: 13 % (ref 12.3–15.4)
WBC: 8.1 10*3/uL (ref 3.4–10.8)

## 2017-07-03 ENCOUNTER — Other Ambulatory Visit: Payer: Self-pay | Admitting: Family Medicine

## 2017-07-03 DIAGNOSIS — G4700 Insomnia, unspecified: Secondary | ICD-10-CM

## 2017-07-03 DIAGNOSIS — F419 Anxiety disorder, unspecified: Secondary | ICD-10-CM

## 2017-07-11 ENCOUNTER — Encounter: Payer: Self-pay | Admitting: Family Medicine

## 2017-08-05 DIAGNOSIS — I5022 Chronic systolic (congestive) heart failure: Secondary | ICD-10-CM | POA: Diagnosis not present

## 2017-08-28 ENCOUNTER — Ambulatory Visit (INDEPENDENT_AMBULATORY_CARE_PROVIDER_SITE_OTHER): Payer: Medicare Other

## 2017-08-28 ENCOUNTER — Ambulatory Visit (INDEPENDENT_AMBULATORY_CARE_PROVIDER_SITE_OTHER): Payer: Medicare Other | Admitting: Family Medicine

## 2017-08-28 ENCOUNTER — Encounter: Payer: Self-pay | Admitting: Family Medicine

## 2017-08-28 VITALS — BP 122/64 | HR 76 | Temp 97.5°F | Wt 179.6 lb

## 2017-08-28 VITALS — BP 122/74 | HR 76 | Temp 97.5°F | Ht 71.0 in | Wt 179.6 lb

## 2017-08-28 DIAGNOSIS — I10 Essential (primary) hypertension: Secondary | ICD-10-CM | POA: Diagnosis not present

## 2017-08-28 DIAGNOSIS — J449 Chronic obstructive pulmonary disease, unspecified: Secondary | ICD-10-CM | POA: Diagnosis not present

## 2017-08-28 DIAGNOSIS — E7800 Pure hypercholesterolemia, unspecified: Secondary | ICD-10-CM | POA: Diagnosis not present

## 2017-08-28 DIAGNOSIS — Z0000 Encounter for general adult medical examination without abnormal findings: Secondary | ICD-10-CM

## 2017-08-28 DIAGNOSIS — I255 Ischemic cardiomyopathy: Secondary | ICD-10-CM | POA: Diagnosis not present

## 2017-08-28 MED ORDER — ALBUTEROL SULFATE (2.5 MG/3ML) 0.083% IN NEBU: 2.5000 mg | INHALATION_SOLUTION | RESPIRATORY_TRACT | 12 refills | Status: DC | PRN

## 2017-08-28 NOTE — Progress Notes (Signed)
Patient: Phillip Allison Male    DOB: Jan 22, 1941   77 y.o.   MRN: [id number] Visit Date: 08/28/2017  Today's Provider: Wilhemena Durie, MD   Chief Complaint  Patient presents with  . Hypertension  . COPD   Subjective:     HPI  COPD- Pt reports that he feels like his breathing is getting worse. He more shortness of breath and feels like he needs to use his nebulizer more. Denies chest pain.    Hypertension, follow-up:  BP Readings from Last 3 Encounters:  08/28/17 122/64  08/28/17 122/74  07/01/17 (!) 168/84    He was last seen for hypertension 3 months ago.  BP at that visit was 168/84. Management since that visit includes none. He reports good compliance with treatment. He is not having side effects.  He is not exercising. He is adherent to low salt diet.   Outside blood pressures are not being checked. Patient denies chest pain, chest pressure/discomfort, claudication, exertional chest pressure/discomfort, fatigue, irregular heart beat, lower extremity edema, near-syncope, orthopnea, palpitations, paroxysmal nocturnal dyspnea, syncope and tachypnea.    Wt Readings from Last 3 Encounters:  08/28/17 179 lb 9.6 oz (81.5 kg)  08/28/17 179 lb 9.6 oz (81.5 kg)  07/01/17 179 lb (81.2 kg)   ------------------------------------------------------------------------     No Known Allergies   Current Outpatient Medications:  .  albuterol (PROVENTIL) (2.5 MG/3ML) 0.083% nebulizer solution, Take 3 mLs (2.5 mg total) by nebulization every 6 (six) hours as needed for wheezing or shortness of breath., Disp: 150 mL, Rfl: 11 .  aspirin 81 MG tablet, Take 81 mg by mouth at bedtime. , Disp: , Rfl:  .  carvedilol (COREG) 12.5 MG tablet, Take 12.5 mg by mouth 2 (two) times daily with a meal. , Disp: , Rfl:  .  furosemide (LASIX) 20 MG tablet, Take 20 mg by mouth daily. , Disp: , Rfl:  .  lisinopril (PRINIVIL,ZESTRIL) 10 MG tablet, Take 1 tablet (10 mg total) by mouth  daily., Disp: 90 tablet, Rfl: 3 .  OXYGEN, Inhale into the lungs., Disp: , Rfl:  .  PARoxetine (PAXIL) 20 MG tablet, TAKE 1 TABLET(20 MG) BY MOUTH AT BEDTIME, Disp: 90 tablet, Rfl: 3 .  pravastatin (PRAVACHOL) 40 MG tablet, TAKE 1 TABLET BY MOUTH AT BEDTIME, Disp: 90 tablet, Rfl: 3  Review of Systems  Constitutional: Negative.   HENT: Negative.   Eyes: Negative.   Respiratory: Positive for shortness of breath.   Cardiovascular: Negative.   Gastrointestinal: Negative.   Endocrine: Negative.   Genitourinary: Negative.   Musculoskeletal: Negative.   Skin: Negative.   Allergic/Immunologic: Negative.   Neurological: Negative.   Hematological: Negative.   Psychiatric/Behavioral: Positive for sleep disturbance.    Social History   Tobacco Use  . Smoking status: Former Smoker    Packs/day: 0.50    Years: 50.00    Pack years: 25.00    Types: Cigarettes  . Smokeless tobacco: Never Used  . Tobacco comment: 2014?  Substance Use Topics  . Alcohol use: No   Objective:   BP 122/64   Pulse 76   Temp (!) 97.5 F (36.4 C) (Oral)   Wt 179 lb 9.6 oz (81.5 kg)   SpO2 94%   BMI 25.05 kg/m  Vitals:   08/28/17 1040  BP: 122/64  Pulse: 76  Temp: (!) 97.5 F (36.4 C)  TempSrc: Oral  SpO2: 94%  Weight: 179 lb 9.6 oz (81.5 kg)  Physical Exam  Constitutional: He is oriented to person, place, and time. He appears well-developed and well-nourished.  HENT:  Head: Normocephalic and atraumatic.  Right Ear: External ear normal.  Left Ear: External ear normal.  Nose: Nose normal.  Eyes: Conjunctivae are normal.  Neck: No thyromegaly present.  Cardiovascular: Normal rate, regular rhythm and normal heart sounds.  Pulmonary/Chest: Effort normal and breath sounds normal.  Abdominal: Soft.  Neurological: He is alert and oriented to person, place, and time.  Skin: Skin is warm and dry.  Psychiatric: He has a normal mood and affect. His behavior is normal. Thought content normal.         Assessment & Plan:     Severe COPD woulld like to try Duoneb but not covered. Have given pt sample of Spireva to try CAD with cardimyopathy HTN HLD     I have done the exam and reviewed the chart and it is accurate to the best of my knowledge. Development worker, community has been used and  any errors in dictation or transcription are unintentional. Miguel Aschoff M.D. [place], MD  [facility]

## 2017-08-28 NOTE — Progress Notes (Signed)
Subjective:   Phillip Allison is a 77 y.o. male who presents for Medicare Annual/Subsequent preventive examination.  Review of Systems:  N/A  Cardiac Risk Factors include: advanced age (>72men, >24 women);male gender;dyslipidemia;hypertension     Objective:    Vitals: BP 122/74 (BP Location: Left Arm)   Pulse 76   Temp (!) 97.5 F (36.4 C) (Oral)   Ht 5\' 11"  (1.803 m)   Wt 179 lb 9.6 oz (81.5 kg)   BMI 25.05 kg/m   Body mass index is 25.05 kg/m.  Advanced Directives 08/28/2017 04/11/2016 12/18/2015 09/11/2015  Does Patient Have a Medical Advance Directive? Yes Yes No No  Type of Paramedic of [place];Living will - -  Copy of [place] in Chart? No - copy requested No - copy requested - -  Would patient like information on creating a medical advance directive? - - No - patient declined information -    Tobacco Social History   Tobacco Use  Smoking Status Former Smoker  . Packs/day: 0.50  . Years: 50.00  . Pack years: 25.00  . Types: Cigarettes  Smokeless Tobacco Never Used  Tobacco Comment   2014?     Counseling given: Not Answered Comment: 2014?   Clinical Intake:     Pain : No/denies pain Pain Score: 0-No pain     Nutritional Status: BMI 25 -29 Overweight Nutritional Risks: None Diabetes: No  How often do you need to have someone help you when you read instructions, pamphlets, or other written materials from your doctor or pharmacy?: 1 - Never  Interpreter Needed?: No  Information entered by :: [facility], LPN  Past Medical History:  Diagnosis Date  . CHF (congestive heart failure) ([place])   . COPD (chronic obstructive pulmonary disease) ([place])   . Depression   . Emphysema lung ([place])   . Hypercholesterolemia   . Hypertension   . Myocardial infarction ([place])   . On home oxygen therapy    2 liters at night and as needed  . Pneumonia    in the past  . Presence of  permanent cardiac pacemaker   . Shortness of breath dyspnea    with exertion   Past Surgical History:  Procedure Laterality Date  . ABDOMINAL AORTIC ANEURYSM REPAIR    . ANGIOPLASTY     left lower extremity  . CATARACT EXTRACTION W/PHACO Right 12/18/2015   Procedure: CATARACT EXTRACTION PHACO AND INTRAOCULAR LENS PLACEMENT (IOC);  Surgeon: Estill Cotta, MD;  Location: [facility] ORS;  Service: Ophthalmology;  Laterality: Right;  [place] 01:58 AP% 23.5 CDE 50.91 fluid pack lot # [id number]  . INSERT / REPLACE / REMOVE PACEMAKER    . PACEMAKER PLACEMENT    . TONSILLECTOMY     Family History  Problem Relation Age of Onset  . Psoriasis Mother   . Heart attack Father    Social History   Socioeconomic History  . Marital status: Widowed    Spouse name: None  . Number of children: 3  . Years of education: None  . Highest education level: Associate degree: occupational, Hotel manager, or vocational program  Social Needs  . Financial resource strain: Not hard at all  . Food insecurity - worry: Never true  . Food insecurity - inability: Never true  . Transportation needs - medical: No  . Transportation needs - non-medical: No  Occupational History  . Occupation: retired  Tobacco Use  . Smoking status: Former Smoker  Packs/day: 0.50    Years: 50.00    Pack years: 25.00    Types: Cigarettes  . Smokeless tobacco: Never Used  . Tobacco comment: 2014?  Substance and Sexual Activity  . Alcohol use: No  . Drug use: No  . Sexual activity: Not Currently  Other Topics Concern  . None  Social History Narrative  . None    Outpatient Encounter Medications as of 08/28/2017  Medication Sig  . albuterol (PROVENTIL) (2.5 MG/3ML) 0.083% nebulizer solution Take 3 mLs (2.5 mg total) by nebulization every 6 (six) hours as needed for wheezing or shortness of breath.  Marland Kitchen aspirin 81 MG tablet Take 81 mg by mouth at bedtime.   . carvedilol (COREG) 12.5 MG tablet Take 12.5 mg by mouth 2 (two) times daily  with a meal.   . furosemide (LASIX) 20 MG tablet Take 20 mg by mouth daily.   Marland Kitchen lisinopril (PRINIVIL,ZESTRIL) 10 MG tablet Take 1 tablet (10 mg total) by mouth daily.  . OXYGEN Inhale into the lungs.  Marland Kitchen PARoxetine (PAXIL) 20 MG tablet TAKE 1 TABLET(20 MG) BY MOUTH AT BEDTIME  . pravastatin (PRAVACHOL) 40 MG tablet TAKE 1 TABLET BY MOUTH AT BEDTIME   No facility-administered encounter medications on file as of 08/28/2017.     Activities of Daily Living In your present state of health, do you have any difficulty performing the following activities: 08/28/2017 07/01/2017  Hearing? Y N  Comment Pt declined referral to see an audiologist. -  Vision? N N  Difficulty concentrating or making decisions? N N  Walking or climbing stairs? Y Y  Comment Due to SOB. -  Dressing or bathing? Y N  Comment Due to SOB. -  Doing errands, shopping? N Y  Conservation officer, nature and eating ? N -  Using the Toilet? N -  In the past six months, have you accidently leaked urine? N -  Do you have problems with loss of bowel control? N -  Managing your Medications? N -  Managing your Finances? N -  Housekeeping or managing your Housekeeping? N -  Some recent data might be hidden    Patient Care Team: Jerrol Banana., MD as PCP - General (Family Medicine) Dingeldein, Remo Lipps, MD as Consulting Physician (Ophthalmology) Corey Skains, MD as Consulting Physician (Cardiology) Lucky Cowboy Erskine Squibb, MD as Referring Physician (Vascular Surgery)   Assessment:   This is a routine wellness examination for Phillip Allison.  Exercise Activities and Dietary recommendations    Goals    . DIET - INCREASE WATER INTAKE     Recommend increasing water intake to 4-6 glasses a day.        Fall Risk Fall Risk  08/28/2017 07/01/2017 04/11/2016 03/13/2015  Falls in the past year? No No No No   Is the patient's home free of loose throw rugs in walkways, pet beds, electrical cords, etc?   yes      Grab bars in the bathroom? no      Handrails  on the stairs?   yes      Adequate lighting?   yes  Timed Get Up and Go Performed: N/A  Depression Screen PHQ 2/9 Scores 08/28/2017 07/01/2017 04/11/2016 03/13/2015  PHQ - 2 Score 2 2 0 2  PHQ- 9 Score 9 6 - 3    Cognitive Function: Pt declined screening today.      6CIT Screen 04/11/2016  What Year? 0 points  What month? 0 points  What time? 0 points  Count back from 20 0 points  Months in reverse 0 points  Repeat phrase 0 points  Total Score 0    Immunization History  Administered Date(s) Administered  . Influenza Split 04/19/2006, 04/23/2012  . Influenza, High Dose Seasonal PF 03/14/2014, 03/13/2015, 03/11/2016  . Influenza,inj,Quad PF,6+ Mos 03/27/2013  . Influenza-Unspecified 02/22/2014, 04/01/2017  . Pneumococcal Conjugate-13 03/13/2015  . Pneumococcal Polysaccharide-23 04/18/2005, 04/23/2012  . Td 09/15/2003    Qualifies for Shingles Vaccine? Due for Shingles vaccine. Declined my offer to administer today. Education has been provided regarding the importance of this vaccine. Pt has been advised to call her insurance company to determine her out of pocket expense. Advised she may also receive this vaccine at her local pharmacy or Health Dept. Verbalized acceptance and understanding.  Screening Tests Health Maintenance  Topic Date Due  . TETANUS/TDAP  09/14/2013  . COLONOSCOPY  04/11/2028 (Originally 06/05/1992)  . INFLUENZA VACCINE  Completed  . PNA vac Low Risk Adult  Completed   Cancer Screenings: Lung: Low Dose CT Chest recommended if Age 48-80 years, 30 pack-year currently smoking OR have quit w/in 15years. Patient does qualify. An Epic message has been sent to Burgess Estelle, RN (Oncology Nurse Navigator) regarding the possible need for this exam. Raquel Sarna will review the patient's chart to determine if the patient truly qualifies for the exam. If the patient qualifies, Raquel Sarna will order the Low Dose CT of the chest to facilitate the scheduling of this  exam. Colorectal: N/A  Additional Screenings:  Hepatitis C Screening: N/A    Plan:  I have personally reviewed and addressed the Medicare Annual Wellness questionnaire and have noted the following in the patient's chart:  A. Medical and social history B. Use of alcohol, tobacco or illicit drugs  C. Current medications and supplements D. Functional ability and status E.  Nutritional status F.  Physical activity G. Advance directives H. List of other physicians I.  Hospitalizations, surgeries, and ER visits in previous 12 months J.  [place] such as hearing and vision if needed, cognitive and depression L. Referrals and appointments - none  In addition, I have reviewed and discussed with patient certain preventive protocols, quality metrics, and best practice recommendations. A written personalized care plan for preventive services as well as general preventive health recommendations were provided to patient.  See attached scanned questionnaire for additional information.   Signed,  Fabio Neighbors, LPN Nurse Health Advisor   Nurse Recommendations: Pt declined the tetanus vaccine and colonoscopy referral today. A message was sent to oncology nurse navigator to see about pts eligibility for a low dose CT scan due to smoking history.

## 2017-08-28 NOTE — Patient Instructions (Addendum)
[name] , Thank you for taking time to come for your Medicare Wellness Visit. I appreciate your ongoing commitment to your health goals. Please review the following plan we discussed and let me know if I can assist you in the future.   Screening recommendations/referrals: Colonoscopy: Pt declines today.  Recommended yearly ophthalmology/optometry visit for glaucoma screening and checkup Recommended yearly dental visit for hygiene and checkup  Vaccinations: Influenza vaccine: Up to date Pneumococcal vaccine: Up to date Tdap vaccine: Pt declines today.  Shingles vaccine: Pt declines today.     Advanced directives: Please bring a copy of your POA (Power of Attorney) and/or Living Will to your next appointment.   Conditions/risks identified: Recommend increasing water intake to 4-6 glasses a day.   Next appointment: 10:40 AM today  Preventive Care 76 Years and Older, Male Preventive care refers to lifestyle choices and visits with your health care provider that can promote health and wellness. What does preventive care include?  A yearly physical exam. This is also called an annual well check.  Dental exams once or twice a year.  Routine eye exams. Ask your health care provider how often you should have your eyes checked.  Personal lifestyle choices, including:  Daily care of your teeth and gums.  Regular physical activity.  Eating a healthy diet.  Avoiding tobacco and drug use.  Limiting alcohol use.  Practicing safe sex.  Taking low doses of aspirin every day.  Taking vitamin and mineral supplements as recommended by your health care provider. What happens during an annual well check? The services and screenings done by your health care provider during your annual well check will depend on your age, overall health, lifestyle risk factors, and family history of disease. Counseling  Your health care provider may ask you questions about your:  Alcohol  use.  Tobacco use.  Drug use.  Emotional well-being.  Home and relationship well-being.  Sexual activity.  Eating habits.  History of falls.  Memory and ability to understand (cognition).  Work and work Statistician. Screening  You may have the following tests or measurements:  Height, weight, and BMI.  Blood pressure.  Lipid and cholesterol levels. These may be checked every 5 years, or more frequently if you are over 23 years old.  Skin check.  Lung cancer screening. You may have this screening every year starting at age 7 if you have a 30-pack-year history of smoking and currently smoke or have quit within the past 15 years.  Fecal occult blood test (FOBT) of the stool. You may have this test every year starting at age 35.  Flexible sigmoidoscopy or colonoscopy. You may have a sigmoidoscopy every 5 years or a colonoscopy every 10 years starting at age 76.  Prostate cancer screening. Recommendations will vary depending on your family history and other risks.  Hepatitis C blood test.  Hepatitis B blood test.  Sexually transmitted disease (STD) testing.  Diabetes screening. This is done by checking your blood sugar (glucose) after you have not eaten for a while (fasting). You may have this done every 1-3 years.  Abdominal aortic aneurysm (AAA) screening. You may need this if you are a current or former smoker.  Osteoporosis. You may be screened starting at age 19 if you are at high risk. Talk with your health care provider about your test results, treatment options, and if necessary, the need for more tests. Vaccines  Your health care provider may recommend certain vaccines, such as:  Influenza vaccine.  This is recommended every year.  Tetanus, diphtheria, and acellular pertussis (Tdap, Td) vaccine. You may need a Td booster every 10 years.  Zoster vaccine. You may need this after age 50.  Pneumococcal 13-valent conjugate (PCV13) vaccine. One dose is  recommended after age 79.  Pneumococcal polysaccharide (PPSV23) vaccine. One dose is recommended after age 76. Talk to your health care provider about which screenings and vaccines you need and how often you need them. This information is not intended to replace advice given to you by your health care provider. Make sure you discuss any questions you have with your health care provider. Document Released: 07/07/2015 Document Revised: 02/28/2016 Document Reviewed: 04/11/2015 Elsevier Interactive Patient Education  2017 [place] Prevention in the Home Falls can cause injuries. They can happen to people of all ages. There are many things you can do to make your home safe and to help prevent falls. What can I do on the outside of my home?  Regularly fix the edges of walkways and driveways and fix any cracks.  Remove anything that might make you trip as you walk through a door, such as a raised step or threshold.  Trim any bushes or trees on the path to your home.  Use bright outdoor lighting.  Clear any walking paths of anything that might make someone trip, such as rocks or tools.  Regularly check to see if handrails are loose or broken. Make sure that both sides of any steps have handrails.  Any raised decks and porches should have guardrails on the edges.  Have any leaves, snow, or ice cleared regularly.  Use sand or salt on walking paths during winter.  Clean up any spills in your garage right away. This includes oil or grease spills. What can I do in the bathroom?  Use night lights.  Install grab bars by the toilet and in the tub and shower. Do not use towel bars as grab bars.  Use non-skid mats or decals in the tub or shower.  If you need to sit down in the shower, use a plastic, non-slip stool.  Keep the floor dry. Clean up any water that spills on the floor as soon as it happens.  Remove soap buildup in the tub or shower regularly.  Attach bath mats  securely with double-sided non-slip rug tape.  Do not have throw rugs and other things on the floor that can make you trip. What can I do in the bedroom?  Use night lights.  Make sure that you have a light by your bed that is easy to reach.  Do not use any sheets or blankets that are too big for your bed. They should not hang down onto the floor.  Have a firm chair that has side arms. You can use this for support while you get dressed.  Do not have throw rugs and other things on the floor that can make you trip. What can I do in the kitchen?  Clean up any spills right away.  Avoid walking on wet floors.  Keep items that you use a lot in easy-to-reach places.  If you need to reach something above you, use a strong step stool that has a grab bar.  Keep electrical cords out of the way.  Do not use floor polish or wax that makes floors slippery. If you must use wax, use non-skid floor wax.  Do not have throw rugs and other things on the floor that can make  you trip. What can I do with my stairs?  Do not leave any items on the stairs.  Make sure that there are handrails on both sides of the stairs and use them. Fix handrails that are broken or loose. Make sure that handrails are as long as the stairways.  Check any carpeting to make sure that it is firmly attached to the stairs. Fix any carpet that is loose or worn.  Avoid having throw rugs at the top or bottom of the stairs. If you do have throw rugs, attach them to the floor with carpet tape.  Make sure that you have a light switch at the top of the stairs and the bottom of the stairs. If you do not have them, ask someone to add them for you. What else can I do to help prevent falls?  Wear shoes that:  Do not have high heels.  Have rubber bottoms.  Are comfortable and fit you well.  Are closed at the toe. Do not wear sandals.  If you use a stepladder:  Make sure that it is fully opened. Do not climb a closed  stepladder.  Make sure that both sides of the stepladder are locked into place.  Ask someone to hold it for you, if possible.  Clearly mark and make sure that you can see:  Any grab bars or handrails.  First and last steps.  Where the edge of each step is.  Use tools that help you move around (mobility aids) if they are needed. These include:  Canes.  Walkers.  Scooters.  Crutches.  Turn on the lights when you go into a dark area. Replace any light bulbs as soon as they burn out.  Set up your furniture so you have a clear path. Avoid moving your furniture around.  If any of your floors are uneven, fix them.  If there are any pets around you, be aware of where they are.  Review your medicines with your doctor. Some medicines can make you feel dizzy. This can increase your chance of falling. Ask your doctor what other things that you can do to help prevent falls. This information is not intended to replace advice given to you by your health care provider. Make sure you discuss any questions you have with your health care provider. Document Released: 04/06/2009 Document Revised: 11/16/2015 Document Reviewed: 07/15/2014 Elsevier Interactive Patient Education  2017 [employer].

## 2017-09-03 ENCOUNTER — Telehealth: Payer: Self-pay | Admitting: *Deleted

## 2017-09-03 DIAGNOSIS — Z122 Encounter for screening for malignant neoplasm of respiratory organs: Secondary | ICD-10-CM

## 2017-09-03 DIAGNOSIS — Z87891 Personal history of nicotine dependence: Secondary | ICD-10-CM

## 2017-09-03 NOTE — Telephone Encounter (Signed)
Received referral for initial lung cancer screening scan. Contacted patient and obtained smoking history,(former, quit 2014, 41.25 pack year) as well as answering questions related to screening process. Patient denies signs of lung cancer such as weight loss or hemoptysis. Patient denies comorbidity that would prevent curative treatment if lung cancer were found. Patient is scheduled for shared decision making visit and CT scan on 09/18/17.

## 2017-09-18 ENCOUNTER — Encounter: Payer: Self-pay | Admitting: Nurse Practitioner

## 2017-09-18 ENCOUNTER — Ambulatory Visit
Admission: RE | Admit: 2017-09-18 | Discharge: 2017-09-18 | Disposition: A | Discharge status: Home / Self Care | Payer: Medicare Other | Source: Ambulatory Visit | Attending: Nurse Practitioner | Admitting: Nurse Practitioner

## 2017-09-18 ENCOUNTER — Inpatient Hospital Stay: Payer: Medicare Other | Attending: Nurse Practitioner | Admitting: Nurse Practitioner

## 2017-09-18 DIAGNOSIS — J439 Emphysema, unspecified: Secondary | ICD-10-CM | POA: Diagnosis not present

## 2017-09-18 DIAGNOSIS — I700 Atherosclerosis of aorta: Secondary | ICD-10-CM | POA: Insufficient documentation

## 2017-09-18 DIAGNOSIS — Z122 Encounter for screening for malignant neoplasm of respiratory organs: Secondary | ICD-10-CM | POA: Insufficient documentation

## 2017-09-18 DIAGNOSIS — Z87891 Personal history of nicotine dependence: Secondary | ICD-10-CM | POA: Diagnosis not present

## 2017-09-18 DIAGNOSIS — J984 Other disorders of lung: Secondary | ICD-10-CM | POA: Insufficient documentation

## 2017-09-18 NOTE — Progress Notes (Signed)
In accordance with CMS guidelines, patient has met eligibility criteria including age, absence of signs or symptoms of lung cancer.  Social History   Tobacco Use  . Smoking status: Former Smoker    Packs/day: 0.75    Years: 54.00    Pack years: 40.50    Types: Cigarettes    Last attempt to quit: 2014    Years since quitting: 5.2  . Smokeless tobacco: Never Used  . Tobacco comment: 2014?  Substance Use Topics  . Alcohol use: No  . Drug use: No     A shared decision-making session was conducted prior to the performance of CT scan. This includes one or more decision aids, includes benefits and harms of screening, follow-up diagnostic testing, over-diagnosis, false positive rate, and total radiation exposure.  Counseling on the importance of adherence to annual lung cancer LDCT screening, impact of co-morbidities, and ability or willingness to undergo diagnosis and treatment is imperative for compliance of the program.  Counseling on the importance of continued smoking cessation for former smokers; the importance of smoking cessation for current smokers, and information about tobacco cessation interventions have been given to patient including [place] and [facility] quit [place] programs.  Written order for lung cancer screening with LDCT has been given to the patient and any and all questions have been answered to the best of my abilities.   Yearly follow up will be coordinated by Burgess Estelle, Thoracic Navigator.  Beckey Rutter, DNP, AGNP-C [place] [facility] [phone] (office) 09/18/17 4:17 PM

## 2017-09-22 ENCOUNTER — Telehealth: Payer: Self-pay | Admitting: *Deleted

## 2017-09-22 NOTE — Telephone Encounter (Signed)
Notified patient of LDCT lung cancer screening program results with recommendation for 3 month follow up imaging. Also notified of incidental findings noted below and is encouraged to discuss further with PCP who will receive a copy of this note and/or the CT report. Patient verbalizes understanding.   IMPRESSION: Lung-RADS 4A, suspicious. Follow up low-dose chest CT without contrast in 3 months (please use the following order, "CT CHEST LCS NODULE FOLLOW-UP W/O CM") is recommended.  Right upper lobe pleural-parenchymal scarring with nodular component measuring up to 11.7 mm. Additional clustered/segmental nodular opacities in the posterior left lower lobe, measuring up to 8.7 mm. Both are favored to be benign but technically deserve 3 month follow-up due to size.  Aortic Atherosclerosis (ICD10-I70.0) and Emphysema (ICD10-J43.9).

## 2017-09-26 ENCOUNTER — Other Ambulatory Visit: Payer: Self-pay | Admitting: Family Medicine

## 2017-10-07 ENCOUNTER — Emergency Department: Payer: Medicare Other

## 2017-10-07 ENCOUNTER — Other Ambulatory Visit: Payer: Self-pay

## 2017-10-07 ENCOUNTER — Encounter: Payer: Self-pay | Admitting: Emergency Medicine

## 2017-10-07 ENCOUNTER — Inpatient Hospital Stay
Admission: EM | Admit: 2017-10-07 | Discharge: 2017-10-09 | DRG: 871 | Disposition: A | Discharge status: Home Health | Payer: Medicare Other | Attending: Internal Medicine | Admitting: Internal Medicine

## 2017-10-07 DIAGNOSIS — I130 Hypertensive heart and chronic kidney disease with heart failure and stage 1 through stage 4 chronic kidney disease, or unspecified chronic kidney disease: Secondary | ICD-10-CM | POA: Diagnosis present

## 2017-10-07 DIAGNOSIS — J9622 Acute and chronic respiratory failure with hypercapnia: Secondary | ICD-10-CM | POA: Diagnosis not present

## 2017-10-07 DIAGNOSIS — R0602 Shortness of breath: Secondary | ICD-10-CM | POA: Diagnosis not present

## 2017-10-07 DIAGNOSIS — Z87891 Personal history of nicotine dependence: Secondary | ICD-10-CM

## 2017-10-07 DIAGNOSIS — J9621 Acute and chronic respiratory failure with hypoxia: Secondary | ICD-10-CM | POA: Diagnosis present

## 2017-10-07 DIAGNOSIS — E875 Hyperkalemia: Secondary | ICD-10-CM | POA: Diagnosis present

## 2017-10-07 DIAGNOSIS — J441 Chronic obstructive pulmonary disease with (acute) exacerbation: Secondary | ICD-10-CM | POA: Diagnosis not present

## 2017-10-07 DIAGNOSIS — I5022 Chronic systolic (congestive) heart failure: Secondary | ICD-10-CM | POA: Diagnosis present

## 2017-10-07 DIAGNOSIS — Z9981 Dependence on supplemental oxygen: Secondary | ICD-10-CM

## 2017-10-07 DIAGNOSIS — I252 Old myocardial infarction: Secondary | ICD-10-CM

## 2017-10-07 DIAGNOSIS — R0603 Acute respiratory distress: Secondary | ICD-10-CM | POA: Diagnosis not present

## 2017-10-07 DIAGNOSIS — E872 Acidosis: Secondary | ICD-10-CM | POA: Diagnosis present

## 2017-10-07 DIAGNOSIS — T380X5A Adverse effect of glucocorticoids and synthetic analogues, initial encounter: Secondary | ICD-10-CM | POA: Diagnosis present

## 2017-10-07 DIAGNOSIS — Z9862 Peripheral vascular angioplasty status: Secondary | ICD-10-CM

## 2017-10-07 DIAGNOSIS — A419 Sepsis, unspecified organism: Principal | ICD-10-CM | POA: Diagnosis present

## 2017-10-07 DIAGNOSIS — I2510 Atherosclerotic heart disease of native coronary artery without angina pectoris: Secondary | ICD-10-CM | POA: Diagnosis present

## 2017-10-07 DIAGNOSIS — J189 Pneumonia, unspecified organism: Secondary | ICD-10-CM | POA: Diagnosis present

## 2017-10-07 DIAGNOSIS — J440 Chronic obstructive pulmonary disease with acute lower respiratory infection: Secondary | ICD-10-CM | POA: Diagnosis not present

## 2017-10-07 DIAGNOSIS — Z66 Do not resuscitate: Secondary | ICD-10-CM | POA: Diagnosis present

## 2017-10-07 DIAGNOSIS — Z8249 Family history of ischemic heart disease and other diseases of the circulatory system: Secondary | ICD-10-CM

## 2017-10-07 DIAGNOSIS — R739 Hyperglycemia, unspecified: Secondary | ICD-10-CM | POA: Diagnosis present

## 2017-10-07 DIAGNOSIS — E785 Hyperlipidemia, unspecified: Secondary | ICD-10-CM | POA: Diagnosis present

## 2017-10-07 DIAGNOSIS — Z7982 Long term (current) use of aspirin: Secondary | ICD-10-CM

## 2017-10-07 DIAGNOSIS — I4891 Unspecified atrial fibrillation: Secondary | ICD-10-CM | POA: Diagnosis present

## 2017-10-07 DIAGNOSIS — N179 Acute kidney failure, unspecified: Secondary | ICD-10-CM | POA: Diagnosis present

## 2017-10-07 DIAGNOSIS — F329 Major depressive disorder, single episode, unspecified: Secondary | ICD-10-CM | POA: Diagnosis present

## 2017-10-07 DIAGNOSIS — Z950 Presence of cardiac pacemaker: Secondary | ICD-10-CM

## 2017-10-07 DIAGNOSIS — N184 Chronic kidney disease, stage 4 (severe): Secondary | ICD-10-CM | POA: Diagnosis present

## 2017-10-07 MED ORDER — SODIUM CHLORIDE 0.9 % IV SOLN
500.0000 mg | Freq: Once | INTRAVENOUS | Status: AC
  Administered 2017-10-08: 500 mg via INTRAVENOUS
  Filled 2017-10-07: qty 500

## 2017-10-07 MED ORDER — SODIUM CHLORIDE 0.9 % IV SOLN
1.0000 g | Freq: Once | INTRAVENOUS | Status: AC
  Administered 2017-10-08: 1 g via INTRAVENOUS
  Filled 2017-10-07: qty 10

## 2017-10-07 MED ORDER — SODIUM CHLORIDE 0.9 % IV BOLUS
1000.0000 mL | Freq: Once | INTRAVENOUS | Status: AC
  Administered 2017-10-08: 1000 mL via INTRAVENOUS

## 2017-10-07 NOTE — ED Provider Notes (Signed)
[facility] Emergency Department Provider Note   ____________________________________________   I have reviewed the triage vital signs and the nursing notes.   HISTORY  Chief Complaint Respiratory Distress   History limited by: Altered Mental Status   HPI Phillip Allison is a 77 y.o. male who presents to the emergency department today via EMS because of concerns for respiratory distress.  EMS states that family told him he had not been feeling well throughout the day but then around 6:00 this evening started having increasing shortness of breath.  Patient does have a history of COPD.  Patient was quite tachypneic and hypoxic for EMS upon arrival.  They did put patient on nonrebreather which helped with the oxygen saturation however did not place him on CPAP given patient's decreased responsiveness.  Patient himself cannot give any history.  Per medical record review patient has a history of COPD, CHF, MI.   Past Medical History:  Diagnosis Date  . CHF (congestive heart failure) ([place])   . COPD (chronic obstructive pulmonary disease) ([place])   . Depression   . Emphysema lung ([place])   . Hypercholesterolemia   . Hypertension   . Myocardial infarction ([place])   . On home oxygen therapy    2 liters at night and as needed  . Pneumonia    in the past  . Presence of permanent cardiac pacemaker   . Shortness of breath dyspnea    with exertion    Patient Active Problem List   Diagnosis Date Noted  . COPD (chronic obstructive pulmonary disease) ([place]) 03/11/2016  . History of tobacco use 03/13/2015  . History of atrial fibrillation 03/13/2015  . Chronic systolic heart failure ([place]) 03/13/2015  . Intraventricular block 03/13/2015  . 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[place]) 03/10/2015  . Cardiovascular disease 03/10/2015  . Arteriosclerosis of coronary artery 03/10/2015  . CAFL (chronic airflow limitation) ([place]) 03/10/2015  . Chronic kidney disease (CKD), stage III  (moderate) ([place]) 03/10/2015  . Depression, major, single episode, mild ([place]) 03/10/2015  . 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 Acid reflux 03/10/2015  . Heart & renal disease, hypertensive, with heart failure ([place]) 03/10/2015  . Cardiomyopathy, ischemic 03/10/2015  . 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[place]) 03/10/2015  . Hypercholesterolemia without hypertriglyceridemia 03/10/2015  . Cardiac defibrillator in place 01/25/2015  . MI (mitral incompetence) 11/01/2014  . Benign essential HTN 10/17/2014    Past Surgical History:  Procedure Laterality Date  . ABDOMINAL AORTIC ANEURYSM REPAIR    . ANGIOPLASTY     left lower extremity  . CATARACT EXTRACTION W/PHACO Right 12/18/2015   Procedure: CATARACT EXTRACTION PHACO AND INTRAOCULAR LENS PLACEMENT (IOC);  Surgeon: Estill Cotta, MD;  Location: [facility] ORS;  Service: Ophthalmology;  Laterality: Right;  [place] 01:58 AP% 23.5 CDE 50.91 fluid pack lot # [id number]  . INSERT / REPLACE / REMOVE PACEMAKER    . PACEMAKER PLACEMENT    . TONSILLECTOMY      Prior to Admission medications   Medication Sig Start Date End Date Taking? Authorizing Provider  albuterol (PROVENTIL) (2.5 MG/3ML) 0.083% nebulizer solution Take 3 mLs (2.5 mg total) by nebulization every 4 (four) hours as needed for wheezing or shortness of breath. 08/28/17   Jerrol Banana., MD  aspirin 81 MG tablet Take 81 mg by mouth at bedtime.  08/22/11   [provider]  carvedilol (COREG) 12.5 MG tablet Take 12.5 mg by mouth 2 (two) times daily with a meal.  08/22/11   [provider]  furosemide (LASIX) 20 MG tablet Take 20 mg by mouth daily.  08/22/11   [provider]  lisinopril (PRINIVIL,ZESTRIL) 10 MG tablet Take 1 tablet (10 mg total) by mouth daily. 07/01/17   Jerrol Banana., MD  OXYGEN Inhale into the lungs.    [provider]  PARoxetine (PAXIL) 20 MG tablet TAKE 1 TABLET(20 MG) BY MOUTH AT BEDTIME 07/03/17   Jerrol Banana., MD  pravastatin (PRAVACHOL) 40 MG tablet TAKE 1 TABLET BY MOUTH AT BEDTIME 09/27/17   Jerrol Banana., MD    Allergies Patient has no known allergies.  Family History  Problem Relation Age of Onset  . Psoriasis Mother   . Heart attack Father     Social History Social History   Tobacco Use  . Smoking status: Former Smoker    Packs/day: 0.75    Years: 54.00    Pack years: 40.50    Types: Cigarettes    Last attempt to quit: 2014    Years since quitting: 5.2  . Smokeless tobacco: Never Used  . Tobacco comment: 2014?  Substance Use Topics  . Alcohol use: No  . Drug use: No    Review of Systems Unable to obtain secondary to AMS  ____________________________________________   PHYSICAL EXAM:  VITAL SIGNS: ED Triage Vitals  Enc Vitals Group     BP 204/108     Pulse 114     Resp 43     Temp 97.8     Temp src      SpO2 100   Constitutional: Somnolent. Not oriented.  Eyes: Conjunctivae are normal.  ENT   Head: Normocephalic and atraumatic.   Nose: No congestion/rhinnorhea.   Mouth/Throat: Mucous membranes are moist.   Neck: No stridor. Hematological/Lymphatic/Immunilogical: No cervical lymphadenopathy. Cardiovascular: Tachycardic, regular rhythm.  No murmurs, rubs, or gallops.  Respiratory: Severe respiratory distress. Diminished breath sounds diffusely. Gastrointestinal: Soft and non tender. No rebound. No guarding.  Genitourinary: Deferred Musculoskeletal: Normal range of motion in all extremities. No lower extremity edema. Neurologic:  Somnolent. Opens eyes to verbal stimuli. Not answering questions. Skin:  Skin is warm, dry and intact. No rash noted.  ____________________________________________    LABS (pertinent positives/negatives)  ABG pH 7.03 PCO2 109 CBC wbc 16.8, hgb 14.6, plt 218 CMP na 141, k 6.2, cr 2.54 ____________________________________________   EKG  I, Nance Pear, attending physician, personally viewed  and interpreted this EKG  EKG Time: 2256 Rate: 114 Rhythm: sinus tachycardia Axis: right axis deviation Intervals: qtc 455 QRS: nonspecific intraventricular conduction delay ST changes: no st elevation Impression: abnormal ekg   ____________________________________________    RADIOLOGY  CXR Airspace disease in right lung concerning for pneumonia  ____________________________________________   PROCEDURES  Procedures  CRITICAL CARE Performed by: Nance Pear   Total critical care time: 30 minutes  Critical care time was exclusive of separately billable procedures and treating other patients.  Critical care was necessary to treat or prevent imminent or life-threatening deterioration.  Critical care was time spent personally by me on the following activities: development of treatment plan with patient and/or surrogate as well as nursing, discussions with consultants, evaluation of patient's response to treatment, examination of patient, obtaining history from patient or surrogate, ordering and performing treatments and interventions, ordering and review of laboratory studies, ordering and review of radiographic studies, pulse oximetry and re-evaluation of patient's condition.  ____________________________________________   INITIAL IMPRESSION / ASSESSMENT AND PLAN / ED COURSE  Pertinent labs & imaging  results that were available during my care of the patient were reviewed by me and considered in my medical decision making (see chart for details).  Patient presented to the emergency department via EMS in severe respiratory distress.  Patient was severely altered upon arrival.  Patient was placed on BiPAP shortly after arrival and did seem to tolerate it well.  Initial ABG showed CO2 over 100.  I do think this likely contributes to the patient's altered mental status.  In the patient did improve and start to become more responsive during his stay here in the emergency  department as he spent more time on the BiPAP.  Chest x-ray is concerning for pneumonia.  Patient will be given broad-spectrum antibiotics and will be admitted to the hospital service. Discussed findings and plan with patient's family.  ____________________________________________   FINAL CLINICAL IMPRESSION(S) / ED DIAGNOSES  Final diagnoses:  Respiratory distress  COPD exacerbation ([place])  Pneumonia due to infectious organism, unspecified laterality, unspecified part of lung     Note: This dictation was prepared with Dragon dictation. Any transcriptional errors that result from this process are unintentional     Nance Pear, MD 10/08/17 1753

## 2017-10-07 NOTE — ED Triage Notes (Signed)
Pt bib [facility] from home d/t RR distress that began approx 6pm. Pt has hx CHF, COPD. O2 saturation in 80s upon EMS arrival, 97% on 15L aerosol mask. 2 duoneb in [place], 125 solumedrol through #20 Left Hand. Wheezes throughout. Diminished breath sounds. Pt minimally responsive with movement upon arrival.

## 2017-10-08 ENCOUNTER — Encounter: Payer: Self-pay | Admitting: Internal Medicine

## 2017-10-08 ENCOUNTER — Other Ambulatory Visit: Payer: Self-pay

## 2017-10-08 DIAGNOSIS — J181 Lobar pneumonia, unspecified organism: Secondary | ICD-10-CM | POA: Diagnosis not present

## 2017-10-08 DIAGNOSIS — E872 Acidosis: Secondary | ICD-10-CM | POA: Diagnosis present

## 2017-10-08 DIAGNOSIS — Z7982 Long term (current) use of aspirin: Secondary | ICD-10-CM | POA: Diagnosis not present

## 2017-10-08 DIAGNOSIS — E875 Hyperkalemia: Secondary | ICD-10-CM | POA: Diagnosis present

## 2017-10-08 DIAGNOSIS — Z9981 Dependence on supplemental oxygen: Secondary | ICD-10-CM | POA: Diagnosis not present

## 2017-10-08 DIAGNOSIS — I2510 Atherosclerotic heart disease of native coronary artery without angina pectoris: Secondary | ICD-10-CM | POA: Diagnosis present

## 2017-10-08 DIAGNOSIS — E785 Hyperlipidemia, unspecified: Secondary | ICD-10-CM | POA: Diagnosis present

## 2017-10-08 DIAGNOSIS — J440 Chronic obstructive pulmonary disease with acute lower respiratory infection: Secondary | ICD-10-CM | POA: Diagnosis present

## 2017-10-08 DIAGNOSIS — T380X5A Adverse effect of glucocorticoids and synthetic analogues, initial encounter: Secondary | ICD-10-CM | POA: Diagnosis present

## 2017-10-08 DIAGNOSIS — J9621 Acute and chronic respiratory failure with hypoxia: Secondary | ICD-10-CM | POA: Diagnosis not present

## 2017-10-08 DIAGNOSIS — I5022 Chronic systolic (congestive) heart failure: Secondary | ICD-10-CM | POA: Diagnosis present

## 2017-10-08 DIAGNOSIS — I4891 Unspecified atrial fibrillation: Secondary | ICD-10-CM | POA: Diagnosis present

## 2017-10-08 DIAGNOSIS — Z950 Presence of cardiac pacemaker: Secondary | ICD-10-CM | POA: Diagnosis not present

## 2017-10-08 DIAGNOSIS — N179 Acute kidney failure, unspecified: Secondary | ICD-10-CM | POA: Diagnosis present

## 2017-10-08 DIAGNOSIS — R0603 Acute respiratory distress: Secondary | ICD-10-CM | POA: Diagnosis not present

## 2017-10-08 DIAGNOSIS — J441 Chronic obstructive pulmonary disease with (acute) exacerbation: Secondary | ICD-10-CM | POA: Diagnosis not present

## 2017-10-08 DIAGNOSIS — R739 Hyperglycemia, unspecified: Secondary | ICD-10-CM | POA: Diagnosis present

## 2017-10-08 DIAGNOSIS — N184 Chronic kidney disease, stage 4 (severe): Secondary | ICD-10-CM | POA: Diagnosis present

## 2017-10-08 DIAGNOSIS — J9622 Acute and chronic respiratory failure with hypercapnia: Secondary | ICD-10-CM | POA: Diagnosis not present

## 2017-10-08 DIAGNOSIS — J189 Pneumonia, unspecified organism: Secondary | ICD-10-CM | POA: Diagnosis not present

## 2017-10-08 DIAGNOSIS — Z66 Do not resuscitate: Secondary | ICD-10-CM | POA: Diagnosis present

## 2017-10-08 DIAGNOSIS — Z8249 Family history of ischemic heart disease and other diseases of the circulatory system: Secondary | ICD-10-CM | POA: Diagnosis not present

## 2017-10-08 DIAGNOSIS — I252 Old myocardial infarction: Secondary | ICD-10-CM | POA: Diagnosis not present

## 2017-10-08 DIAGNOSIS — F329 Major depressive disorder, single episode, unspecified: Secondary | ICD-10-CM | POA: Diagnosis present

## 2017-10-08 DIAGNOSIS — I130 Hypertensive heart and chronic kidney disease with heart failure and stage 1 through stage 4 chronic kidney disease, or unspecified chronic kidney disease: Secondary | ICD-10-CM | POA: Diagnosis present

## 2017-10-08 DIAGNOSIS — A419 Sepsis, unspecified organism: Secondary | ICD-10-CM | POA: Diagnosis present

## 2017-10-08 LAB — COMPREHENSIVE METABOLIC PANEL
ALT: 20 U/L (ref 17–63)
AST: 41 U/L (ref 15–41)
Albumin: 3.6 g/dL (ref 3.5–5.0)
Alkaline Phosphatase: 89 U/L (ref 38–126)
Anion gap: 9 (ref 5–15)
BUN: 42 mg/dL — AB (ref 6–20)
CHLORIDE: 106 mmol/L (ref 101–111)
CO2: 26 mmol/L (ref 22–32)
CREATININE: 2.54 mg/dL — AB (ref 0.61–1.24)
Calcium: 8.3 mg/dL — ABNORMAL LOW (ref 8.9–10.3)
GFR calc non Af Amer: 23 mL/min — ABNORMAL LOW (ref >60)
GFR, EST AFRICAN AMERICAN: 27 mL/min — AB (ref >60)
Glucose, Bld: 264 mg/dL — ABNORMAL HIGH (ref 65–99)
Potassium: 6.2 mmol/L — ABNORMAL HIGH (ref 3.5–5.1)
SODIUM: 141 mmol/L (ref 135–145)
Total Bilirubin: 0.3 mg/dL (ref 0.3–1.2)
Total Protein: 6.8 g/dL (ref 6.5–8.1)

## 2017-10-08 LAB — TROPONIN I
TROPONIN I: 0.04 ng/mL — AB (ref <0.03)
TROPONIN I: 0.13 ng/mL — AB (ref <0.03)
Troponin I: 0.11 ng/mL (ref <0.03)

## 2017-10-08 LAB — URINALYSIS, COMPLETE (UACMP) WITH MICROSCOPIC
BILIRUBIN URINE: NEGATIVE
Bacteria, UA: NONE SEEN
Glucose, UA: NEGATIVE mg/dL
Hgb urine dipstick: NEGATIVE
KETONES UR: NEGATIVE mg/dL
LEUKOCYTES UA: NEGATIVE
Nitrite: NEGATIVE
PROTEIN: 30 mg/dL — AB
SQUAMOUS EPITHELIAL / LPF: NONE SEEN
Specific Gravity, Urine: 1.015 (ref 1.005–1.030)
pH: 5 (ref 5.0–8.0)

## 2017-10-08 LAB — LACTIC ACID, PLASMA
LACTIC ACID, VENOUS: 1.7 mmol/L (ref 0.5–1.9)
Lactic Acid, Venous: 3.8 mmol/L (ref 0.5–1.9)

## 2017-10-08 LAB — BASIC METABOLIC PANEL
Anion gap: 5 (ref 5–15)
BUN: 48 mg/dL — ABNORMAL HIGH (ref 6–20)
CO2: 28 mmol/L (ref 22–32)
CREATININE: 2.54 mg/dL — AB (ref 0.61–1.24)
Calcium: 8 mg/dL — ABNORMAL LOW (ref 8.9–10.3)
Chloride: 113 mmol/L — ABNORMAL HIGH (ref 101–111)
GFR calc Af Amer: 27 mL/min — ABNORMAL LOW (ref >60)
GFR, EST NON AFRICAN AMERICAN: 23 mL/min — AB (ref >60)
Glucose, Bld: 145 mg/dL — ABNORMAL HIGH (ref 65–99)
Potassium: 4.7 mmol/L (ref 3.5–5.1)
SODIUM: 146 mmol/L — AB (ref 135–145)

## 2017-10-08 LAB — MRSA PCR SCREENING: MRSA BY PCR: NEGATIVE

## 2017-10-08 LAB — CBC WITH DIFFERENTIAL/PLATELET
BASOS ABS: 0.1 10*3/uL (ref 0–0.1)
Basophils Relative: 1 %
Eosinophils Absolute: 0.7 10*3/uL (ref 0–0.7)
Eosinophils Relative: 4 %
HEMATOCRIT: 44.9 % (ref 40.0–52.0)
HEMOGLOBIN: 14.6 g/dL (ref 13.0–18.0)
LYMPHS PCT: 39 %
Lymphs Abs: 6.5 10*3/uL — ABNORMAL HIGH (ref 1.0–3.6)
MCH: 30.2 pg (ref 26.0–34.0)
MCHC: 32.5 g/dL (ref 32.0–36.0)
MCV: 92.8 fL (ref 80.0–100.0)
MONO ABS: 0.8 10*3/uL (ref 0.2–1.0)
Monocytes Relative: 5 %
NEUTROS ABS: 8.6 10*3/uL — AB (ref 1.4–6.5)
NEUTROS PCT: 51 %
Platelets: 218 10*3/uL (ref 150–440)
RBC: 4.84 MIL/uL (ref 4.40–5.90)
RDW: 13.5 % (ref 11.5–14.5)
WBC: 16.8 10*3/uL — ABNORMAL HIGH (ref 3.8–10.6)

## 2017-10-08 LAB — PROTIME-INR
INR: 1.22
Prothrombin Time: 15.3 seconds — ABNORMAL HIGH (ref 11.4–15.2)

## 2017-10-08 LAB — INFLUENZA PANEL BY PCR (TYPE A & B)
INFLAPCR: NEGATIVE
Influenza B By PCR: NEGATIVE

## 2017-10-08 LAB — GLUCOSE, CAPILLARY: GLUCOSE-CAPILLARY: 129 mg/dL — AB (ref 65–99)

## 2017-10-08 LAB — STREP PNEUMONIAE URINARY ANTIGEN: Strep Pneumo Urinary Antigen: NEGATIVE

## 2017-10-08 LAB — PROCALCITONIN: PROCALCITONIN: 3.38 ng/mL

## 2017-10-08 MED ORDER — SODIUM CHLORIDE 0.9 % IV SOLN: 1.0000 g | INTRAVENOUS | Status: DC

## 2017-10-08 MED ORDER — IPRATROPIUM-ALBUTEROL 0.5-2.5 (3) MG/3ML IN SOLN: 3.0000 mL | RESPIRATORY_TRACT | Status: DC | PRN

## 2017-10-08 MED ORDER — LISINOPRIL 10 MG PO TABS: 10.0000 mg | ORAL_TABLET | Freq: Every day | ORAL | Status: DC

## 2017-10-08 MED ORDER — SODIUM CHLORIDE 0.9 % IV SOLN
1.0000 g | Freq: Every day | INTRAVENOUS | Status: DC
  Administered 2017-10-08: 1 g via INTRAVENOUS
  Filled 2017-10-08 (×2): qty 10

## 2017-10-08 MED ORDER — CARVEDILOL 12.5 MG PO TABS
12.5000 mg | ORAL_TABLET | Freq: Two times a day (BID) | ORAL | Status: DC
Start: 2017-10-08 — End: 2017-10-09
  Administered 2017-10-08: 12.5 mg via ORAL
  Filled 2017-10-08: qty 1

## 2017-10-08 MED ORDER — METHYLPREDNISOLONE SODIUM SUCC 40 MG IJ SOLR: 40.0000 mg | Freq: Every day | INTRAMUSCULAR | Status: DC

## 2017-10-08 MED ORDER — IPRATROPIUM-ALBUTEROL 0.5-2.5 (3) MG/3ML IN SOLN
3.0000 mL | Freq: Four times a day (QID) | RESPIRATORY_TRACT | Status: DC
  Administered 2017-10-08: 3 mL via RESPIRATORY_TRACT
  Filled 2017-10-08: qty 3

## 2017-10-08 MED ORDER — FUROSEMIDE 20 MG PO TABS
20.0000 mg | ORAL_TABLET | Freq: Every day | ORAL | Status: DC
  Administered 2017-10-08 – 2017-10-09 (×2): 20 mg via ORAL
  Filled 2017-10-08 (×2): qty 1

## 2017-10-08 MED ORDER — ENOXAPARIN SODIUM 30 MG/0.3ML ~~LOC~~ SOLN: 30.0000 mg | SUBCUTANEOUS | Status: DC

## 2017-10-08 MED ORDER — SODIUM BICARBONATE 8.4 % IV SOLN
50.0000 meq | Freq: Once | INTRAVENOUS | Status: AC
  Administered 2017-10-08: 50 meq via INTRAVENOUS
  Filled 2017-10-08: qty 50

## 2017-10-08 MED ORDER — BUDESONIDE 0.25 MG/2ML IN SUSP
0.2500 mg | Freq: Two times a day (BID) | RESPIRATORY_TRACT | Status: DC
  Administered 2017-10-08 – 2017-10-09 (×3): 0.25 mg via RESPIRATORY_TRACT
  Filled 2017-10-08 (×3): qty 2

## 2017-10-08 MED ORDER — FUROSEMIDE 40 MG PO TABS: 20.0000 mg | ORAL_TABLET | Freq: Every day | ORAL | Status: DC

## 2017-10-08 MED ORDER — SODIUM CHLORIDE 0.9 % IV SOLN: 500.0000 mg | INTRAVENOUS | Status: DC

## 2017-10-08 MED ORDER — IPRATROPIUM-ALBUTEROL 0.5-2.5 (3) MG/3ML IN SOLN
3.0000 mL | RESPIRATORY_TRACT | Status: DC
  Administered 2017-10-08 (×3): 3 mL via RESPIRATORY_TRACT
  Filled 2017-10-08 (×3): qty 3

## 2017-10-08 MED ORDER — INSULIN ASPART 100 UNIT/ML IV SOLN
10.0000 [IU] | Freq: Once | INTRAVENOUS | Status: AC
Start: 2017-10-08 — End: 2017-10-08
  Administered 2017-10-08: 10 [IU] via INTRAVENOUS
  Filled 2017-10-08: qty 0.1

## 2017-10-08 MED ORDER — AZITHROMYCIN 250 MG PO TABS
500.0000 mg | ORAL_TABLET | Freq: Every day | ORAL | Status: DC
  Administered 2017-10-08: 500 mg via ORAL
  Filled 2017-10-08: qty 2

## 2017-10-08 MED ORDER — PRAVASTATIN SODIUM 40 MG PO TABS
40.0000 mg | ORAL_TABLET | Freq: Every day | ORAL | Status: DC
  Administered 2017-10-08: 40 mg via ORAL
  Filled 2017-10-08: qty 1

## 2017-10-08 MED ORDER — ASPIRIN 81 MG PO CHEW
81.0000 mg | CHEWABLE_TABLET | Freq: Every day | ORAL | Status: DC
Start: 2017-10-08 — End: 2017-10-09
  Administered 2017-10-08: 81 mg via ORAL
  Filled 2017-10-08: qty 1

## 2017-10-08 MED ORDER — PAROXETINE HCL 20 MG PO TABS
20.0000 mg | ORAL_TABLET | Freq: Every day | ORAL | Status: DC
  Administered 2017-10-08: 20 mg via ORAL
  Filled 2017-10-08 (×2): qty 1

## 2017-10-08 MED ORDER — DEXTROSE 50 % IV SOLN
1.0000 | Freq: Once | INTRAVENOUS | Status: AC
  Administered 2017-10-08: 50 mL via INTRAVENOUS
  Filled 2017-10-08: qty 50

## 2017-10-08 MED ORDER — METHYLPREDNISOLONE SODIUM SUCC 40 MG IJ SOLR
40.0000 mg | Freq: Two times a day (BID) | INTRAMUSCULAR | Status: DC
  Administered 2017-10-08 (×2): 40 mg via INTRAVENOUS
  Filled 2017-10-08 (×3): qty 1

## 2017-10-08 MED ORDER — ENOXAPARIN SODIUM 40 MG/0.4ML ~~LOC~~ SOLN
30.0000 mg | SUBCUTANEOUS | Status: DC
  Administered 2017-10-08: 30 mg via SUBCUTANEOUS
  Filled 2017-10-08: qty 0.4

## 2017-10-08 MED ORDER — IPRATROPIUM-ALBUTEROL 0.5-2.5 (3) MG/3ML IN SOLN
3.0000 mL | Freq: Three times a day (TID) | RESPIRATORY_TRACT | Status: DC
  Administered 2017-10-09: 3 mL via RESPIRATORY_TRACT
  Filled 2017-10-08: qty 3

## 2017-10-08 NOTE — Consult Note (Signed)
Name: Phillip Allison. MRN: [id number] DOB: [birth year]    ADMISSION DATE:  10/07/2017 CONSULTATION DATE: 10/07/2017  REFERRING MD : Dr. Jodell Cipro   CHIEF COMPLAINT: Shortness of Breath  BRIEF PATIENT DESCRIPTION:  77 yo male admitted with acute on chronic hypercapnic hypoxic respiratory failure secondary to AECOPD and pneumonia vs. 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  He presented to [facility] ER 04/16 via EMS from home minimally responsive.  Per ER notes pts family reported on 04/16 pt c/o shortness of breath with lethargy although pt arousable for majority of the day.  Upon EMS arrival at pts home his O2 sats were 48-51% on his home O2 @2L  nasal canula.  Therefore, he received nebs, steroids and was placed on CPAP.  Upon arrival to the ER pt remained minimally responsive, therefore pt transitioned to Bipap.  CXR concerning for pneumonia vs. pulmonary edema.  Lab results revealed K+ 6.2, creatinine 2.54, BUN 42, and wbc 16.8.  He was subsequently admitted to the stepdown unit by hospitalist team for further workup and treatment.  PAST MEDICAL HISTORY :   has a past medical history of CHF (congestive heart failure) ([place]), COPD (chronic obstructive pulmonary disease) ([place]), Depression, Emphysema lung ([place]), Hypercholesterolemia, Hypertension, Myocardial infarction ([place]), On home oxygen therapy, Pneumonia, Presence of permanent cardiac pacemaker, and Shortness of breath dyspnea.  has a past surgical history that includes Angioplasty; pacemaker placement; Tonsillectomy; Insert / replace / remove pacemaker; Abdominal aortic aneurysm repair; and Cataract extraction w/PHACO (Right,  12/18/2015). Prior to Admission medications   Medication Sig Start Date End Date Taking? Authorizing Provider  albuterol (PROVENTIL) (2.5 MG/3ML) 0.083% nebulizer solution Take 3 mLs (2.5 mg total) by nebulization every 4 (four) hours as needed for wheezing or shortness of breath. 08/28/17   Jerrol Banana., MD  aspirin 81 MG tablet Take 81 mg by mouth at bedtime.  08/22/11   [provider]  carvedilol (COREG) 12.5 MG tablet Take 12.5 mg by mouth 2 (two) times daily with a meal.  08/22/11   [provider]  furosemide (LASIX) 20 MG tablet Take 20 mg by mouth daily.  08/22/11   [provider]  lisinopril (PRINIVIL,ZESTRIL) 10 MG tablet Take 1 tablet (10 mg total) by mouth daily. 07/01/17   Jerrol Banana., MD  OXYGEN Inhale into the lungs.    [provider]  PARoxetine (PAXIL) 20 MG tablet TAKE 1 TABLET(20 MG) BY MOUTH AT BEDTIME 07/03/17   Jerrol Banana., MD  pravastatin (PRAVACHOL) 40 MG tablet TAKE 1 TABLET BY MOUTH AT BEDTIME 09/27/17   Jerrol Banana., MD   No Known Allergies  FAMILY HISTORY:  family history includes Heart attack in his father; Psoriasis in his mother. SOCIAL HISTORY:  reports that he quit smoking about 5 years ago. His smoking use included cigarettes. He has a 40.50 pack-year smoking history. He has never used smokeless tobacco. He reports that he does not drink alcohol or use drugs.  REVIEW OF SYSTEMS: Positives in BOLD  Constitutional: Negative for fever, chills, weight loss, malaise/fatigue and diaphoresis.  HENT: Negative for hearing loss, ear pain, nosebleeds, congestion, sore throat, neck pain, tinnitus  and ear discharge.   Eyes: Negative for blurred vision, double vision, photophobia, pain, discharge and redness.  Respiratory: cough, hemoptysis, sputum production, shortness of breath, wheezing and stridor.   Cardiovascular: Negative for chest pain, palpitations, orthopnea, claudication, leg swelling and PND.   Gastrointestinal: Negative for heartburn, nausea, vomiting, abdominal pain, diarrhea, constipation, blood in stool and melena.  Genitourinary: Negative for dysuria, urgency, frequency, hematuria and flank pain.  Musculoskeletal: Negative for myalgias, back pain, joint pain and falls.  Skin: Negative for itching and rash.  Neurological: Negative for dizziness, tingling, tremors, sensory change, speech change, focal weakness, seizures, loss of consciousness, weakness and headaches.  Endo/Heme/Allergies: Negative for environmental allergies and polydipsia. Does not bruise/bleed easily.  SUBJECTIVE:  Pt asking if he can take the Bipap mask off.  VITAL SIGNS: Temp:  [97.8 F (36.6 C)] 97.8 F (36.6 C) (04/16 2318) Pulse Rate:  [80-115] 80 (04/17 0115) Resp:  [17-43] 24 (04/17 0231) BP: (75-210)/(44-108) 110/60 (04/17 0204) SpO2:  [96 %-100 %] 100 % (04/17 0231) Weight:  [79.4 kg (175 lb)] 79.4 kg (175 lb) (04/16 2319)  PHYSICAL EXAMINATION: General: well developed, well nourished, NAD on Bipap  Neuro: alert and oriented, follows commands  HEENT: supple, no JVD Cardiovascular: sinus rhythm with depressed ST, no R/G  Lungs: diminished throughout, even, non labored  Abdomen: +BS x4, soft, non tender, non distended  Musculoskeletal: normal bulk and tone, no edema  Skin: intact no rashes or lesions   Recent Labs  Lab 10/07/17 2302  NA 141  K 6.2*  CL 106  CO2 26  BUN 42*  CREATININE 2.54*  GLUCOSE 264*   Recent Labs  Lab 10/07/17 2302  HGB 14.6  HCT 44.9  WBC 16.8*  PLT 218   Dg Chest Portable 1 View  Result Date: 10/07/2017 CLINICAL DATA:  Short of breath EXAM: PORTABLE CHEST 1 VIEW COMPARISON:  CT 09/18/2017 FINDINGS: LEFT-sided pacemaker overlies normal cardiac silhouette. There is a fine airspace pattern in the RIGHT lung which is new from comparison exams. This involves the RIGHT upper lobe and RIGHT lower lobe. LEFT lung is relatively clear. IMPRESSION: New airspace  disease in the RIGHT lung is favored asymmetric pulmonary edema. Recommend clinical correlation for edema versus pneumonia. Electronically Signed   By: Suzy Bouchard M.D.   On: 10/07/2017 23:26    ASSESSMENT / PLAN: Acute on chronic hypercapnic hypoxic respiratory failure secondary to AECOPD and pulmonary edema vs. pneumonia Acute encephalopathy likely secondary to hypercapnia  Acute on chronic renal failure with hyperkalemia  Lactic acidosis  Hx: MI, HTN, Home O2 @2L  qhs and prn, and Pacemaker P: Prn Bipap for hypoxia and/or dyspnea Scheduled and prn bronchodilator therapy IV and nebulized steroids  Repeat CXR in am   Stat BMP  Replace electrolytes as indicated  Monitor UOP VTE px: subq lovenox Trend CBC Monitor for s/sx of bleeding and transfuse for hgb <7 Trend WBC and monitor fever curve Trend PCT and lactic acid Follow cultures  Continue current abx  Marda Stalker, [place] Pager [phone] (please enter 7 digits) PCCM Consult Pager [phone] (please enter 7 digits)

## 2017-10-08 NOTE — Progress Notes (Signed)
[place] [facility] [place] NAME: Phillip Allison    MR#:  [id number]  DATE OF BIRTH:  10-27-1940  SUBJECTIVE:   Given with increasing shortness of breath. Patient was found to have increased CO2. Feels a lot better. No fever. Family in the room.  REVIEW OF SYSTEMS:   Review of Systems  Constitutional: Negative for chills, fever and weight loss.  HENT: Negative for ear discharge, ear pain and nosebleeds.   Eyes: Negative for blurred vision, pain and discharge.  Respiratory: Positive for cough and shortness of breath. Negative for sputum production, wheezing and stridor.   Cardiovascular: Negative for chest pain, palpitations, orthopnea and PND.  Gastrointestinal: Negative for abdominal pain, diarrhea, nausea and vomiting.  Genitourinary: Negative for frequency and urgency.  Musculoskeletal: Negative for back pain and joint pain.  Neurological: Negative for sensory change, speech change, focal weakness and weakness.  Psychiatric/Behavioral: Negative for depression and hallucinations. The patient is not nervous/anxious.    Tolerating Diet:yesTolerating PT: not needed  DRUG ALLERGIES:  No Known Allergies  VITALS:  Blood pressure (!) 172/74, pulse 86, temperature 98.6 F (37 C), temperature source Oral, resp. rate 16, height 5\' 11"  (1.803 m), weight 82.6 kg (182 lb 1.6 oz), SpO2 97 %.  PHYSICAL EXAMINATION:   Physical Exam  GENERAL:  77 y.o.-year-old patient lying in the bed with no acute distress.  EYES: Pupils equal, round, reactive to light and accommodation. No scleral icterus. Extraocular muscles intact.  HEENT: Head atraumatic, normocephalic. Oropharynx and nasopharynx clear.  NECK:  Supple, no jugular venous distention. No thyroid enlargement, no tenderness.  LUNGS:distnat breath sounds bilaterally, no wheezing, rales, rhonchi. No use of accessory muscles of respiration.  CARDIOVASCULAR: S1, S2 normal. No murmurs, rubs, or  gallops.  ABDOMEN: Soft, nontender, nondistended. Bowel sounds present. No organomegaly or mass.  EXTREMITIES: No cyanosis, clubbing or edema b/l.    NEUROLOGIC: Cranial nerves II through XII are intact. No focal Motor or sensory deficits b/l.   PSYCHIATRIC:  patient is alert and oriented x 3.  SKIN: No obvious rash, lesion, or ulcer.   LABORATORY PANEL:  CBC Recent Labs  Lab 10/07/17 2302  WBC 16.8*  HGB 14.6  HCT 44.9  PLT 218    Chemistries  Recent Labs  Lab 10/07/17 2302 10/08/17 0429  NA 141 146*  K 6.2* 4.7  CL 106 113*  CO2 26 28  GLUCOSE 264* 145*  BUN 42* 48*  CREATININE 2.54* 2.54*  CALCIUM 8.3* 8.0*  AST 41  --   ALT 20  --   ALKPHOS 89  --   BILITOT 0.3  --    Cardiac Enzymes Recent Labs  Lab 10/08/17 0934  TROPONINI 0.11*   RADIOLOGY:  Dg Chest Portable 1 View  Result Date: 10/07/2017 CLINICAL DATA:  Short of breath EXAM: PORTABLE CHEST 1 VIEW COMPARISON:  CT 09/18/2017 FINDINGS: LEFT-sided pacemaker overlies normal cardiac silhouette. There is a fine airspace pattern in the RIGHT lung which is new from comparison exams. This involves the RIGHT upper lobe and RIGHT lower lobe. LEFT lung is relatively clear. IMPRESSION: New airspace disease in the RIGHT lung is favored asymmetric pulmonary edema. Recommend clinical correlation for edema versus pneumonia. Electronically Signed   By: Suzy Bouchard M.D.   On: 10/07/2017 23:26   ASSESSMENT AND PLAN:   29M acute COPD exacerbation/acute on chronic hypoxemic hypercapneic respiratory failure, sepsis/acute community-acquired pneumonia, AKI on CKD, hyperkalemia, hyperglycemia.  1.) Acute COPD exacerbation/acute on chronic  hypoxemic/ hypercapneic respiratory failure: - Pt p/w SOB, severe hypoxia. He reportedly had a pCO2 of 109 on ABG -Possibly precipitated by pneumonia - ABx, DuoNebs q4h until improved, steroids (IV SoluMedrol 40mg  qD until tolerating PO), incentive spirometry, pulmonary toileting.  Continuous pulse oximetry.  -pt is off BIPAP  2.) Sepsis/acute community-acquired pneumonia: (+) tachycardia, tachypnea and hypoxia on admission. - WBC 16.8. SIRS (+). CXR (+) R lung infiltrate. - Pt not clinically in CHF - Ceftriaxone + Azithromycin.  -BCx negative  3.) AKI on CKD/hyperkalemia: Pt w/ baseline CKD III-IV (likely 2/2 HTN, cardiorenal syndrome type 2, aged kidney), baseline Cr 1.6-2.2. Cr on present admission 2.54. - Pt likely w/ prerenal AKI superimposed on CKD, in setting of poor PO intake. - Received IVF bolus in ED, will hold further IVF in setting of severe CHF/ICM (EF 20%) - K+ 6.2, ordered for nebs, dextrose, insulin, bicarbonate. Cardiac monitoring. Hold Lasix and Lisinopril. Monitor BMP, avoid nephrotoxins.  4.) Hyperglycemia: Glucose 264. Likely elevated 2/2 infxn, steroids. Monitor.  5.) HTN: c/w Coreg (as BP tolerates). Hold Lasix, Lisinopril 2/2 AKI + CKD + hyperkalemia (as above).  6.) HLD/CAD/MI/CHF/ICM: c/w ASA, Statin, beta blocker as tolerated. Hold ACE-I (as above).  7.) Depression: c/w Paxil.  8.) FEN/GI: Renal diet.  9.) DVT PPx: Lovenox 30mg  SQ qD.  10.) Code status: DNR/DNI.  D/ pt and family in the room  Case discussed with Care Management/Social Worker. Management plans discussed with the patient, family and they are in agreement.  CODE STATUS: DNR  DVT Prophylaxis: lovenox  TOTAL TIME TAKING CARE OF THIS PATIENT: *30* minutes.  >50% time spent on counselling and coordination of care  POSSIBLE D/C IN 1-2 DAYS, DEPENDING ON CLINICAL CONDITION.  Note: This dictation was prepared with Dragon dictation along with smaller phrase technology. Any transcriptional errors that result from this process are unintentional.  Fritzi Mandes M.D on 10/08/2017 at 2:12 PM  Between 7am to 6pm - Pager - [phone]  After 6pm go to [email or web address] - password [email or web address] [facility]  Office  [phone]  CC: Primary care  physician; Jerrol Banana., MDPatient ID: Phillip Allison., male   DOB: 24-Oct-1940, 77 y.o.   MRN: [id number]

## 2017-10-08 NOTE — Progress Notes (Signed)
Pt being transferred to room 238. Report called to [place], Therapist, sports. Pt transferred to room 238 without incident.

## 2017-10-08 NOTE — ED Notes (Signed)
Admitting md at bedside

## 2017-10-08 NOTE — ED Notes (Signed)
Will transport at RT availability

## 2017-10-08 NOTE — H&P (Signed)
[place] [facility] [place] NAME: Jamiere Gulas    MR#:  [id number]  DATE OF BIRTH:  11/25/1940  DATE OF ADMISSION:  10/07/2017  PRIMARY CARE PHYSICIAN: Jerrol Banana., MD   REQUESTING/REFERRING PHYSICIAN: [facility] ED  CHIEF COMPLAINT:   Chief Complaint  Patient presents with  . Respiratory Distress    HISTORY OF PRESENT ILLNESS:  Jody Silas  is a 77 y.o. 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 Pt is on BiPAP at the time of my Hx/examination, and is lethargic but arousable. Hx is provided by pt's son (DPOA) and daughter-in-law at bedside, with whom the pt resides. Per the family's narrative, the pt was feeling relatively well during the day (Tuesday 10/07/2017), though he did report feeling "tired" and somewhat generally unwell in the later afternoon. @~1830PM, pt's son took dinner downstairs to the pt. He noticed that his father (pt) ate very little, which is uncharacteristic, as he apparently "eats very well" at baseline. It was also noted that pt was laboring to breathe while eating. After dinner, the pt laid down on the couch to watch TV. At ~1900PM, the pt got up to use the bathroom, and then walked back to the couch and slumped down backwards into the couch. He exhibited tachypnea and labored breathing, and stated he felt "hot", "clammy" and short of breath. After 5-29min, he managed to collect himself. He tried to take a nebulizer treatment, with minimal improvement. @~2100PM and 2145PM, the pt again attempted to use the bathroom, and both times he exhibited worsening labored breathing, as well as accessory muscle use, but he was  still responsive and appropriate. @~2200, the pt became lethargic/obtunded and poorly-responsive. The pt's family called EMS. They report that the pt's initial saturation on EMS arrival at their home was 48-51% on his home 2L [place]. He received O2 mask, nebs, steroids and was placed on CPAP and brought to the hospital, where he was transitioned to BiPAP. He is not wheezing at the time of my examination, and is responsive and breathing comfortably on BiPAP. The pt did not have any other complaints aside from SOB and fatigue/malaise. He denied F/C/N/V/D/AP, CP, palpitations, diaphoresis, night sweats, rigors, HA/blurred vision, LH, urinary symptoms. Pt lives at home w/ his son, daughter-in-law and grandson and 1 cat. He lives on a single floor (first floor in-law suite), ambulates without device/asssitance and is independent of ADLs. Pt's son is durable medical power of attorney, and states pt is DNR/DNI.  PAST MEDICAL HISTORY:   Past Medical History:  Diagnosis Date  . CHF (congestive heart failure) ([place])   . COPD (chronic obstructive pulmonary disease) ([place])   . Depression   . Emphysema lung ([place])   . Hypercholesterolemia   . Hypertension   . Myocardial infarction ([place])   . On home oxygen therapy    2 liters at night and as needed  . Pneumonia    in the past  . Presence of permanent cardiac pacemaker   . Shortness of breath dyspnea    with exertion    PAST SURGICAL HISTORY:   Past Surgical History:  Procedure Laterality Date  . ABDOMINAL AORTIC ANEURYSM REPAIR    . ANGIOPLASTY  left lower extremity  . CATARACT EXTRACTION W/PHACO Right 12/18/2015   Procedure: CATARACT EXTRACTION PHACO AND INTRAOCULAR LENS PLACEMENT (IOC);  Surgeon: Estill Cotta, MD;  Location: [facility] ORS;  Service: Ophthalmology;  Laterality: Right;  [place] 01:58 AP% 23.5 CDE 50.91 fluid pack lot # [id number]  . INSERT / REPLACE / REMOVE PACEMAKER    . PACEMAKER PLACEMENT    . TONSILLECTOMY      SOCIAL HISTORY:    Social History   Tobacco Use  . Smoking status: Former Smoker    Packs/day: 0.75    Years: 54.00    Pack years: 40.50    Types: Cigarettes    Last attempt to quit: 2014    Years since quitting: 5.2  . Smokeless tobacco: Never Used  . Tobacco comment: 2014?  Substance Use Topics  . Alcohol use: No    FAMILY HISTORY:   Family History  Problem Relation Age of Onset  . Psoriasis Mother   . Heart attack Father     DRUG ALLERGIES:  No Known Allergies  REVIEW OF SYSTEMS:   Review of Systems  Constitutional: Positive for malaise/fatigue. Negative for chills, diaphoresis, fever and weight loss.  HENT: Negative for congestion, hearing loss, sinus pain and sore throat.   Eyes: Negative for blurred vision, double vision and photophobia.  Respiratory: Positive for shortness of breath and wheezing. Negative for cough, hemoptysis and sputum production.   Cardiovascular: Negative for chest pain, palpitations, orthopnea, claudication, leg swelling and PND.  Gastrointestinal: Negative for abdominal pain, blood in stool, constipation, diarrhea, heartburn, melena, nausea and vomiting.  Genitourinary: Negative for dysuria, frequency, hematuria and urgency.  Musculoskeletal: Negative for back pain, joint pain, myalgias and neck pain.  Skin: Negative for itching and rash.  Neurological: Positive for weakness (+) generalized weakness. Negative for dizziness, tingling, tremors, sensory change, speech change, focal weakness, seizures and headaches.    MEDICATIONS AT HOME:   Prior to Admission medications   Medication Sig Start Date End Date Taking? Authorizing Provider  albuterol (PROVENTIL) (2.5 MG/3ML) 0.083% nebulizer solution Take 3 mLs (2.5 mg total) by nebulization every 4 (four) hours as needed for wheezing or shortness of breath. 08/28/17   Jerrol Banana., MD  aspirin 81 MG tablet Take 81 mg by mouth at bedtime.  08/22/11   [provider]  carvedilol (COREG) 12.5 MG  tablet Take 12.5 mg by mouth 2 (two) times daily with a meal.  08/22/11   [provider]  furosemide (LASIX) 20 MG tablet Take 20 mg by mouth daily.  08/22/11   [provider]  lisinopril (PRINIVIL,ZESTRIL) 10 MG tablet Take 1 tablet (10 mg total) by mouth daily. 07/01/17   Jerrol Banana., MD  OXYGEN Inhale into the lungs.    [provider]  PARoxetine (PAXIL) 20 MG tablet TAKE 1 TABLET(20 MG) BY MOUTH AT BEDTIME 07/03/17   Jerrol Banana., MD  pravastatin (PRAVACHOL) 40 MG tablet TAKE 1 TABLET BY MOUTH AT BEDTIME 09/27/17   Jerrol Banana., MD      VITAL SIGNS:  Blood pressure (!) 91/45, pulse 80, temperature 97.8 F (36.6 C), temperature source Axillary, resp. rate (!) 22, height 5\' 11"  (1.803 m), weight 79.4 kg (175 lb), SpO2 100 %.  PHYSICAL EXAMINATION:  Physical Exam  Constitutional: He is oriented to person, place, and time. He appears well-developed and well-nourished. He appears lethargic. He is cooperative. He is easily aroused. No distress. Face mask in place.  HENT:  Head: Normocephalic and atraumatic.  Mouth/Throat: No oropharyngeal exudate.  Eyes: Conjunctivae and EOM are normal. No scleral icterus.  Neck: Normal range of motion. Neck supple. No JVD present. No thyromegaly present.  Cardiovascular: Normal rate, regular rhythm, S1 normal, S2 normal and normal heart sounds. Exam reveals no gallop and no friction rub.  No murmur heard. Pulmonary/Chest: Effort normal. No accessory muscle usage or stridor. Tachypnea noted. No respiratory distress. He has decreased breath sounds in the right upper field, the right middle field, the right lower field, the left upper field, the left middle field and the left lower field. He has no wheezes. He has no rhonchi. He has no rales.  Abdominal: Soft. Bowel sounds are normal. He exhibits no distension. There is no tenderness. There is no rebound and no guarding.  Musculoskeletal: He exhibits no  edema.  Lymphadenopathy:    He has no cervical adenopathy.  Neurological: He is oriented to person, place, and time and easily aroused. He appears lethargic. He is not disoriented.  Skin: Skin is warm, dry and intact. No rash noted. He is not diaphoretic. No erythema.  Psychiatric: He has a normal mood and affect. Judgment and thought content normal.    LABORATORY PANEL:   CBC Recent Labs  Lab 10/07/17 2302  WBC 16.8*  HGB 14.6  HCT 44.9  PLT 218   ------------------------------------------------------------------------------------------------------------------  Chemistries  Recent Labs  Lab 10/07/17 2302  NA 141  K 6.2*  CL 106  CO2 26  GLUCOSE 264*  BUN 42*  CREATININE 2.54*  CALCIUM 8.3*  AST 41  ALT 20  ALKPHOS 89  BILITOT 0.3   ------------------------------------------------------------------------------------------------------------------  Cardiac Enzymes No results for input(s): TROPONINI in the last 168 hours. ------------------------------------------------------------------------------------------------------------------  RADIOLOGY:  Dg Chest Portable 1 View  Result Date: 10/07/2017 CLINICAL DATA:  Short of breath EXAM: PORTABLE CHEST 1 VIEW COMPARISON:  CT 09/18/2017 FINDINGS: LEFT-sided pacemaker overlies normal cardiac silhouette. There is a fine airspace pattern in the RIGHT lung which is new from comparison exams. This involves the RIGHT upper lobe and RIGHT lower lobe. LEFT lung is relatively clear. IMPRESSION: New airspace disease in the RIGHT lung is favored asymmetric pulmonary edema. Recommend clinical correlation for edema versus pneumonia. Electronically Signed   By: Suzy Bouchard M.D.   On: 10/07/2017 23:26    IMPRESSION AND PLAN:   A/P: 77M acute COPD exacerbation/acute on chronic hypoxemic hypercapneic respiratory failure, sepsis/acute community-acquired pneumonia, AKI on CKD, hyperkalemia, hyperglycemia.  1.) Acute COPD  exacerbation/acute on chronic hypoxemic hypercapneic respiratory failure: Pt p/w SOB, severe hypoxia. He reportedly had a pCO2 of 109 on ABG, though I do not see these results. He has improved on BiPAP. Possibly precipitated by pneumonia, discussed separately below. ABx, DuoNebs q4h until improved, steroids (IV SoluMedrol 40mg  qD until tolerating PO), incentive spirometry, pulmonary toileting. Continuous pulse oximetry. Admit to stepdown.  2.) Sepsis/acute community-acquired pneumonia: (+) tachycardia, tachypnea and hypoxia on admission. WBC 16.8. SIRS (+). CXR (+) R lung infiltrate. Pt not clinically in CHF, infiltrate much more likely represents community acquired acute bacterial pneumonia. Ceftriaxone + Azithromycin. BCx, sputum Cx, UStrep, ULegionella, rapid Flu pending. Mgmt as above.  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 Cr on present admission 2.54. Pt likely w/ prerenal AKI superimposed on CKD, in setting of poor PO intake. Received IVF bolus in ED, will hold further IVF in setting of severe CHF/ICM (EF 20%), as I  do not wish to perpetuate an iatrogenic CHF exacerbation. K+ 6.2, ordered for nebs, dextrose, insulin, bicarbonate. Cardiac monitoring. Hold Lasix and Lisinopril. Monitor BMP, avoid nephrotoxins.  4.) Hyperglycemia: Glucose 264. Likely elevated 2/2 infxn, steroids. Monitor.  5.) HTN: c/w Coreg (as BP tolerates). Hold Lasix, Lisinopril 2/2 AKI + CKD + hyperkalemia (as above).  6.) HLD/CAD/MI/CHF/ICM: c/w ASA, Statin, beta blocker as tolerated. Hold ACE-I (as above).  7.) Depression: c/w Paxil.  8.) FEN/GI: Renal diet.  9.) DVT PPx: Lovenox 30mg  SQ qD.  10.) Code status: DNR/DNI.  11.) Disposition: Admission, pt expected to stay > 2 midnights.  All the records are reviewed and case discussed with ED provider. Management plans discussed with the patient, family and they are in  agreement.  CODE STATUS: DNR/DNI.  TOTAL TIME TAKING CARE OF THIS PATIENT: 90 minutes.    Arta Silence M.D on 10/08/2017 at 2:00 AM  Between 7am to 6pm - Pager - [phone]  After 6pm go to [email or web address] - password [email or web address] [facility]  [facility]  Office  [phone]  CC: Primary care physician; Jerrol Banana., MD   Note: This dictation was prepared with Dragon dictation along with smaller phrase technology. Any transcriptional errors that result from this process are unintentional.

## 2017-10-09 ENCOUNTER — Telehealth: Payer: Self-pay | Admitting: Family Medicine

## 2017-10-09 LAB — BLOOD GAS, ARTERIAL
Acid-base deficit: 3.9 mmol/L — ABNORMAL HIGH (ref 0.0–2.0)
Bicarbonate: 25.6 mmol/L (ref 20.0–28.0)
Delivery systems: POSITIVE
EXPIRATORY PAP: 8
FIO2: 50
INSPIRATORY PAP: 15
O2 Saturation: 97.3 %
Patient temperature: 37
pCO2 arterial: 67 mmHg (ref 32.0–48.0)
pH, Arterial: 7.19 — CL (ref 7.350–7.450)
pO2, Arterial: 113 mmHg — ABNORMAL HIGH (ref 83.0–108.0)

## 2017-10-09 LAB — BASIC METABOLIC PANEL
Anion gap: 5 (ref 5–15)
BUN: 58 mg/dL — AB (ref 6–20)
CHLORIDE: 110 mmol/L (ref 101–111)
CO2: 26 mmol/L (ref 22–32)
Calcium: 8.4 mg/dL — ABNORMAL LOW (ref 8.9–10.3)
Creatinine, Ser: 2.58 mg/dL — ABNORMAL HIGH (ref 0.61–1.24)
GFR calc Af Amer: 26 mL/min — ABNORMAL LOW (ref >60)
GFR calc non Af Amer: 23 mL/min — ABNORMAL LOW (ref >60)
Glucose, Bld: 136 mg/dL — ABNORMAL HIGH (ref 65–99)
POTASSIUM: 4.7 mmol/L (ref 3.5–5.1)
SODIUM: 141 mmol/L (ref 135–145)

## 2017-10-09 LAB — HIV ANTIBODY (ROUTINE TESTING W REFLEX): HIV SCREEN 4TH GENERATION: NONREACTIVE

## 2017-10-09 LAB — LEGIONELLA PNEUMOPHILA SEROGP 1 UR AG: L. PNEUMOPHILA SEROGP 1 UR AG: NEGATIVE

## 2017-10-09 MED ORDER — CEFDINIR 300 MG PO CAPS: 300.0000 mg | ORAL_CAPSULE | Freq: Two times a day (BID) | ORAL | 0 refills | Status: DC

## 2017-10-09 MED ORDER — AMLODIPINE BESYLATE 5 MG PO TABS: 5.0000 mg | ORAL_TABLET | Freq: Every day | ORAL | 1 refills | Status: AC

## 2017-10-09 MED ORDER — PREDNISONE 10 MG PO TABS: ORAL_TABLET | ORAL | 0 refills | Status: DC

## 2017-10-09 MED ORDER — AZITHROMYCIN 250 MG PO TABS: ORAL_TABLET | ORAL | 0 refills | Status: DC

## 2017-10-09 MED ORDER — AMLODIPINE BESYLATE 5 MG PO TABS
5.0000 mg | ORAL_TABLET | Freq: Every day | ORAL | Status: DC
  Administered 2017-10-09: 5 mg via ORAL
  Filled 2017-10-09: qty 1

## 2017-10-09 MED ORDER — CARVEDILOL 25 MG PO TABS
25.0000 mg | ORAL_TABLET | Freq: Two times a day (BID) | ORAL | Status: DC
  Administered 2017-10-09: 25 mg via ORAL
  Filled 2017-10-09: qty 1

## 2017-10-09 MED ORDER — PREDNISONE 50 MG PO TABS
50.0000 mg | ORAL_TABLET | Freq: Every day | ORAL | Status: DC
  Administered 2017-10-09: 50 mg via ORAL
  Filled 2017-10-09: qty 1

## 2017-10-09 MED ORDER — CEFDINIR 300 MG PO CAPS
300.0000 mg | ORAL_CAPSULE | Freq: Two times a day (BID) | ORAL | Status: DC
  Administered 2017-10-09: 300 mg via ORAL
  Filled 2017-10-09 (×2): qty 1

## 2017-10-09 NOTE — Plan of Care (Signed)
  Problem: Clinical Measurements: Goal: Will remain free from infection Outcome: Progressing Goal: Respiratory complications will improve Outcome: Progressing   Problem: Activity: Goal: Risk for activity intolerance will decrease Outcome: Progressing   Problem: Pain Managment: Goal: General experience of comfort will improve Outcome: Progressing   Problem: Safety: Goal: Ability to remain free from injury will improve Outcome: Progressing

## 2017-10-09 NOTE — Care Management Note (Signed)
Case Management Note  Patient Details  Name: Phillip Allison. MRN: [id number] Date of Birth: [birth year]   Referral called to [facility] with [place].  Pending auth for triology.  After approval will be delivered to home.   Subjective/Objective:                    Action/Plan:   Expected Discharge Date:  10/09/17               Expected Discharge Plan:  [place]  In-House Referral:     Discharge planning Services  CM Consult  Post Acute Care Choice:  Durable Medical Equipment, Home Health Choice offered to:     DME Arranged:  [facility] DME Agency:  [facility] Arranged:  RN [facility] Agency:  [place]  Status of Service:  Completed, signed off  If discussed at [place] of Stay Meetings, dates discussed:    Additional Comments:  Beverly Sessions, RN 10/09/2017, 2:20 PM

## 2017-10-09 NOTE — Progress Notes (Signed)
Pt d/c to home today.  IV removed intact.  D/c paperwork printed and reviewed w/pt.  All medication questions and concerns reviewed and pt states understanding.  All Rx's given to patient. Pt requested to wheelchaired out for d/c.    

## 2017-10-09 NOTE — Telephone Encounter (Signed)
[facility] scheduled pt 1 week hospital f/u with Dr. Rosanna Randy for 10/16/17 @ 3:40 pm. Pt is being discharged today 10/09/17 and was treated for respiratory failure. Please advise. Thanks TNP

## 2017-10-09 NOTE — Progress Notes (Addendum)
Pt was walked to the nurses station and was on his 2 liter oxygen which he uses chronically. Pt did complained of SOB and oxygen went down to 86 when got close to his bed, but went back up at high 91's when he sat on his bed. Will continue to monitor.

## 2017-10-09 NOTE — Discharge Summary (Addendum)
[facility] [place] NAME: Phillip Allison    MR#:  [id number]  DATE OF BIRTH:  1941/03/02  DATE OF ADMISSION:  10/07/2017 ADMITTING PHYSICIAN: Amelia Jo, MD   DATE OF DISCHARGE: *10/09/2017  PRIMARY CARE PHYSICIAN: Jerrol Banana., MD    ADMISSION DIAGNOSIS:  Respiratory distress [R06.03] COPD exacerbation (HCC) [J44.1] Pneumonia due to infectious organism, unspecified laterality, unspecified part of lung [J18.9]  DISCHARGE DIAGNOSIS:  Acute on chornic respiraotry failure due to COPD flare and right LL penumonia  SECONDARY DIAGNOSIS:   Past Medical History:  Diagnosis Date  . CHF (congestive heart failure) ([place])   . COPD (chronic obstructive pulmonary disease) ([place])   . Depression   . Emphysema lung ([place])   . Hypercholesterolemia   . Hypertension   . Myocardial infarction ([place])   . On home oxygen therapy    2 liters at night and as needed  . Pneumonia    in the past  . Presence of permanent cardiac pacemaker   . Shortness of breath dyspnea    with exertion    HOSPITAL COURSE:   33M acute COPD exacerbation/acute on chronic hypoxemic hypercapneic respiratory failure, sepsis/acute community-acquired pneumonia, AKI on CKD, hyperkalemia, hyperglycemia.  1.) Acute COPD exacerbation/acute on chronic hypoxemic/ hypercapneic respiratory failure: - Pt p/w SOB, severe hypoxia. He reportedly had a pCO2 of 109 on ABG -Possibly precipitated by pneumonia - ABx, DuoNebs q4h until improved, steroids (IV SoluMedrol 40mg  BID ---change to oral taper, incentive spirometry, pulmonary toileting.  -pt is off BIPAP-Has chronic home oxygen 2 liter/min -  Pt continues to exhibit s/o hypercapnia with severe chronic resp failure due to severe COPD. Pt requires to use NIV during sleep and in daytime to help with exacerbation periods. The use of NIV will treat both high pco2 and can reduce risk of exacerbations and future  hospitalizations when used at night and during the day. The patient will need these advanced settings in conjuction with their current medication regimen;BIPAP is not an option due to its functional limitations and the severity of the pt's condition. Failure to have NIV available for use over 24 hours could lead to death.  2.) Sepsis/acute community-acquired pneumonia: (+) tachycardia, tachypnea and hypoxia on admission. - WBC 16.8. SIRS (+). CXR (+) R lung infiltrate. 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 Cr on present admission 2.54. - Pt likely w/ prerenal AKI superimposed on CKD, in setting of poor PO intake. - Received IVF bolus in ED, will hold further IVF in setting of severe CHF/ICM (EF 20%) - K+ 6.2, ordered for nebs, dextrose, insulin, bicarbonate. Cardiac monitoring.  -resume low dose Lasix and d/ced Lisinopril till seen by nephrology (appt to be made)  4.) Hyperglycemia: Glucose 264. Likely elevated 2/2 infxn, steroids.  -sugars better now  5.) HTN: c/w Coreg (as BP tolerates). resume Lasix, d/c Lisinopril 2/2 AKI + CKD + hyperkalemia (as above). -added amlodipine  6.) HLD/CAD/MI/CHF/ICM: c/w ASA, Statin, beta blocker as tolerated. Hold ACE-I (as above).  7.) Depression: c/w Paxil.  8.) FEN/GI: Renal diet.  Overall feels better D/c home Spoke with son on the phone  CONSULTS OBTAINED:  Treatment Team:  Arta Silence, MD  DRUG ALLERGIES:  No Known Allergies  DISCHARGE MEDICATIONS:   Allergies as of 10/09/2017  No Known Allergies     Medication List    STOP taking these medications   lisinopril 10 MG tablet Commonly known as:  PRINIVIL,ZESTRIL     TAKE these medications   albuterol (2.5 MG/3ML) 0.083% nebulizer solution Commonly known as:  PROVENTIL Take  3 mLs (2.5 mg total) by nebulization every 4 (four) hours as needed for wheezing or shortness of breath.   amLODipine 5 MG tablet Commonly known as:  NORVASC Take 1 tablet (5 mg total) by mouth daily. Start taking on:  10/10/2017   aspirin 81 MG tablet Take 81 mg by mouth at bedtime.   azithromycin 250 MG tablet Commonly known as:  ZITHROMAX Take daily   cefdinir 300 MG capsule Commonly known as:  OMNICEF Take 1 capsule (300 mg total) by mouth every 12 (twelve) hours.   COREG 12.5 MG tablet Generic drug:  carvedilol Take 12.5 mg by mouth 2 (two) times daily with a meal.   LASIX 20 MG tablet Generic drug:  furosemide Take 20 mg by mouth daily.   OXYGEN Inhale into the lungs.   PARoxetine 20 MG tablet Commonly known as:  PAXIL TAKE 1 TABLET(20 MG) BY MOUTH AT BEDTIME   pravastatin 40 MG tablet Commonly known as:  PRAVACHOL TAKE 1 TABLET BY MOUTH AT BEDTIME   predniSONE 10 MG tablet Commonly known as:  DELTASONE Start 50 mg daily then taper by 10 mg daily then stop Start taking on:  10/10/2017       If you experience worsening of your admission symptoms, develop shortness of breath, life threatening emergency, suicidal or homicidal thoughts you must seek medical attention immediately by calling 911 or calling your MD immediately  if symptoms less severe.  You Must read complete instructions/literature along with all the possible adverse reactions/side effects for all the Medicines you take and that have been prescribed to you. Take any new Medicines after you have completely understood and accept all the possible adverse reactions/side effects.   Please note  You were cared for by a hospitalist during your hospital stay. If you have any questions about your discharge medications or the care you received while you were in the hospital after you are discharged, you can call the unit and asked to speak with the hospitalist on call if the hospitalist that took care of you is  not available. Once you are discharged, your primary care physician will handle any further medical issues. Please note that NO REFILLS for any discharge medications will be authorized once you are discharged, as it is imperative that you return to your primary care physician (or establish a relationship with a primary care physician if you do not have one) for your aftercare needs so that they can reassess your need for medications and monitor your lab values. Today   SUBJECTIVE   When can I go home?  VITAL SIGNS:  Blood pressure 140/88, pulse 83, temperature 98.1 F (36.7 C), resp. rate 18, height 5\' 11"  (1.803 m), weight 82.6 kg (182 lb 1.6 oz), SpO2 99 %.  I/O:    Intake/Output Summary (Last 24 hours) at 10/09/2017 1029 Last data filed at 10/09/2017 1018 Gross per 24 hour  Intake 580 ml  Output 1300 ml  Net -720 ml    PHYSICAL EXAMINATION:  GENERAL:  77 y.o.-year-old patient lying in the bed with no acute distress.  EYES: Pupils equal, round, reactive to light and accommodation. No scleral icterus. Extraocular muscles intact.  HEENT: Head atraumatic, normocephalic. Oropharynx  and nasopharynx clear.  NECK:  Supple, no jugular venous distention. No thyroid enlargement, no tenderness.  LUNGS: Normal breath sounds bilaterally, no wheezing, rales,rhonchi or crepitation. No use of accessory muscles of respiration.  CARDIOVASCULAR: S1, S2 normal. No murmurs, rubs, or gallops.  ABDOMEN: Soft, non-tender, non-distended. Bowel sounds present. No organomegaly or mass.  EXTREMITIES: No pedal edema, cyanosis, or clubbing.  NEUROLOGIC: Cranial nerves II through XII are intact. Muscle strength 5/5 in all extremities. Sensation intact. Gait not checked.  PSYCHIATRIC: The patient is alert and oriented x 3.  SKIN: No obvious rash, lesion, or ulcer.   DATA REVIEW:   CBC  Recent Labs  Lab 10/07/17 2302  WBC 16.8*  HGB 14.6  HCT 44.9  PLT 218    Chemistries  Recent Labs  Lab  10/07/17 2302  10/09/17 0756  NA 141   < > 141  K 6.2*   < > 4.7  CL 106   < > 110  CO2 26   < > 26  GLUCOSE 264*   < > 136*  BUN 42*   < > 58*  CREATININE 2.54*   < > 2.58*  CALCIUM 8.3*   < > 8.4*  AST 41  --   --   ALT 20  --   --   ALKPHOS 89  --   --   BILITOT 0.3  --   --    < > = values in this interval not displayed.    Microbiology Results   Recent Results (from the past 240 hour(s))  Culture, blood (Routine x 2)     Status: None (Preliminary result)   Collection Time: 10/07/17 11:02 PM  Result Value Ref Range Status   Specimen Description BLOOD RIGHT WRIST  Final   Special Requests   Final    BOTTLES DRAWN AEROBIC AND ANAEROBIC Blood Culture adequate volume   Culture   Final    NO GROWTH 1 DAY Performed at [facility], [address] [place], [place] [address]    Report Status PENDING  Incomplete  Culture, blood (Routine x 2)     Status: None (Preliminary result)   Collection Time: 10/07/17 11:02 PM  Result Value Ref Range Status   Specimen Description BLOOD RIGHT [facility]  Final   Special Requests   Final    BOTTLES DRAWN AEROBIC AND ANAEROBIC Blood Culture adequate volume   Culture   Final    NO GROWTH 1 DAY Performed at [facility], [address] [place], [place] [address]    Report Status PENDING  Incomplete  MRSA PCR Screening     Status: None   Collection Time: 10/08/17  2:49 AM  Result Value Ref Range Status   MRSA by PCR NEGATIVE NEGATIVE Final    Comment:        The GeneXpert MRSA Assay (FDA approved for NASAL specimens only), is one component of a comprehensive MRSA colonization surveillance program. It is not intended to diagnose MRSA infection nor to guide or monitor treatment for MRSA infections. Performed at [facility] [address] [place], [place] [address]     RADIOLOGY:  Dg Chest Portable 1 View  Result Date: 10/07/2017 CLINICAL DATA:  Short of breath EXAM: PORTABLE CHEST 1 VIEW COMPARISON:   CT 09/18/2017 FINDINGS: LEFT-sided pacemaker overlies normal cardiac silhouette. There is a fine airspace pattern in the RIGHT lung which is new from comparison exams. This involves the RIGHT upper lobe and RIGHT lower lobe.  LEFT lung is relatively clear. IMPRESSION: New airspace disease in the RIGHT lung is favored asymmetric pulmonary edema. Recommend clinical correlation for edema versus pneumonia. Electronically Signed   By: Suzy Bouchard M.D.   On: 10/07/2017 23:26     Management plans discussed with the patient, family and they are in agreement.  CODE STATUS:     Code Status Orders  (From admission, onward)        Start     Ordered   10/08/17 0244  Do not attempt resuscitation (DNR)  Continuous    Question Answer Comment  In the event of cardiac or respiratory ARREST Do not call a "code blue"   In the event of cardiac or respiratory ARREST Do not perform Intubation, CPR, defibrillation or ACLS   In the event of cardiac or respiratory ARREST Use medication by any route, position, wound care, and other measures to relive pain and suffering. May use oxygen, suction and manual treatment of airway obstruction as needed for comfort.      10/08/17 0243    Code Status History    This patient has a current code status but no historical code status.    Advance Directive Documentation     Most Recent Value  Type of Advance Directive  Healthcare Power of Attorney  Pre-existing out of facility DNR order (yellow form or pink MOST form)  -  "MOST" Form in Place?  -      TOTAL TIME TAKING CARE OF THIS PATIENT: 40 minutes.    Fritzi Mandes M.D on 10/09/2017 at 10:29 AM  Between 7am to 6pm - Pager - [phone] After 6pm go to [email or web address] - password [email or web address]  Office  [phone]  CC: Primary care physician; Jerrol Banana., MD

## 2017-10-10 DIAGNOSIS — N183 Chronic kidney disease, stage 3 (moderate): Secondary | ICD-10-CM | POA: Diagnosis not present

## 2017-10-10 DIAGNOSIS — J181 Lobar pneumonia, unspecified organism: Secondary | ICD-10-CM | POA: Diagnosis not present

## 2017-10-10 DIAGNOSIS — I252 Old myocardial infarction: Secondary | ICD-10-CM | POA: Diagnosis not present

## 2017-10-10 DIAGNOSIS — I2510 Atherosclerotic heart disease of native coronary artery without angina pectoris: Secondary | ICD-10-CM | POA: Diagnosis not present

## 2017-10-10 DIAGNOSIS — Z7982 Long term (current) use of aspirin: Secondary | ICD-10-CM | POA: Diagnosis not present

## 2017-10-10 DIAGNOSIS — I130 Hypertensive heart and chronic kidney disease with heart failure and stage 1 through stage 4 chronic kidney disease, or unspecified chronic kidney disease: Secondary | ICD-10-CM | POA: Diagnosis not present

## 2017-10-10 DIAGNOSIS — J440 Chronic obstructive pulmonary disease with acute lower respiratory infection: Secondary | ICD-10-CM | POA: Diagnosis not present

## 2017-10-10 DIAGNOSIS — Z9981 Dependence on supplemental oxygen: Secondary | ICD-10-CM | POA: Diagnosis not present

## 2017-10-10 DIAGNOSIS — F329 Major depressive disorder, single episode, unspecified: Secondary | ICD-10-CM | POA: Diagnosis not present

## 2017-10-10 DIAGNOSIS — J441 Chronic obstructive pulmonary disease with (acute) exacerbation: Secondary | ICD-10-CM | POA: Diagnosis not present

## 2017-10-10 DIAGNOSIS — Z95810 Presence of automatic (implantable) cardiac defibrillator: Secondary | ICD-10-CM | POA: Diagnosis not present

## 2017-10-10 DIAGNOSIS — Z7952 Long term (current) use of systemic steroids: Secondary | ICD-10-CM | POA: Diagnosis not present

## 2017-10-10 DIAGNOSIS — I5022 Chronic systolic (congestive) heart failure: Secondary | ICD-10-CM | POA: Diagnosis not present

## 2017-10-13 DIAGNOSIS — J441 Chronic obstructive pulmonary disease with (acute) exacerbation: Secondary | ICD-10-CM | POA: Diagnosis not present

## 2017-10-13 DIAGNOSIS — J181 Lobar pneumonia, unspecified organism: Secondary | ICD-10-CM | POA: Diagnosis not present

## 2017-10-13 DIAGNOSIS — I252 Old myocardial infarction: Secondary | ICD-10-CM | POA: Diagnosis not present

## 2017-10-13 DIAGNOSIS — I2510 Atherosclerotic heart disease of native coronary artery without angina pectoris: Secondary | ICD-10-CM | POA: Diagnosis not present

## 2017-10-13 DIAGNOSIS — J440 Chronic obstructive pulmonary disease with acute lower respiratory infection: Secondary | ICD-10-CM | POA: Diagnosis not present

## 2017-10-13 DIAGNOSIS — I130 Hypertensive heart and chronic kidney disease with heart failure and stage 1 through stage 4 chronic kidney disease, or unspecified chronic kidney disease: Secondary | ICD-10-CM | POA: Diagnosis not present

## 2017-10-13 LAB — CULTURE, BLOOD (ROUTINE X 2)
Culture: NO GROWTH
Culture: NO GROWTH
Special Requests: ADEQUATE
Special Requests: ADEQUATE

## 2017-10-13 NOTE — Telephone Encounter (Signed)
First available date to complete this was 10/10/17. I was out of office that day. [facility] did not complete TCM. We are out of the 48 hour window to call. Pt has a f/u scheduled 10/16/17. -MM

## 2017-10-14 ENCOUNTER — Telehealth: Payer: Self-pay | Admitting: Family Medicine

## 2017-10-14 DIAGNOSIS — J449 Chronic obstructive pulmonary disease, unspecified: Secondary | ICD-10-CM

## 2017-10-14 NOTE — Telephone Encounter (Signed)
ok 

## 2017-10-14 NOTE — Telephone Encounter (Signed)
Pt;s son [name] called in regarding his palliative care nurse will be contacting  Dr Rosanna Randy regarding pt's is in last stages and they will be giving an update.  Son also requested that his oxygen is on a 5 now and he feels it needs to be increased to a 10 per home health nurse.  Son's call back is [phone] if you need to call him back.    teri

## 2017-10-15 LAB — BLOOD GAS, ARTERIAL
Acid-base deficit: 5.3 mmol/L — ABNORMAL HIGH (ref 0.0–2.0)
BICARBONATE: 28.8 mmol/L — AB (ref 20.0–28.0)
O2 Saturation: 94.8 %
PATIENT TEMPERATURE: 37
pCO2 arterial: 109 mmHg (ref 32.0–48.0)
pH, Arterial: 7.03 — CL (ref 7.350–7.450)
pO2, Arterial: 105 mmHg (ref 83.0–108.0)

## 2017-10-15 NOTE — Telephone Encounter (Signed)
Order placed for Palliative Care  

## 2017-10-15 NOTE — Telephone Encounter (Signed)
Son is to get [facility] to call us with infor on orders needed for the Palliative care.

## 2017-10-15 NOTE — Telephone Encounter (Signed)
Are you saying OK to him increasing his O2 to 10 LPM?

## 2017-10-15 NOTE — Telephone Encounter (Signed)
No--any request for that type change needs to come from nurse/clinical person.

## 2017-10-16 ENCOUNTER — Telehealth: Payer: Self-pay | Admitting: Family Medicine

## 2017-10-16 ENCOUNTER — Telehealth: Payer: Self-pay | Admitting: *Deleted

## 2017-10-16 ENCOUNTER — Inpatient Hospital Stay: Payer: Medicare Other | Admitting: Family Medicine

## 2017-10-16 DIAGNOSIS — I252 Old myocardial infarction: Secondary | ICD-10-CM | POA: Diagnosis not present

## 2017-10-16 DIAGNOSIS — J181 Lobar pneumonia, unspecified organism: Secondary | ICD-10-CM | POA: Diagnosis not present

## 2017-10-16 DIAGNOSIS — I2510 Atherosclerotic heart disease of native coronary artery without angina pectoris: Secondary | ICD-10-CM | POA: Diagnosis not present

## 2017-10-16 DIAGNOSIS — J440 Chronic obstructive pulmonary disease with acute lower respiratory infection: Secondary | ICD-10-CM | POA: Diagnosis not present

## 2017-10-16 DIAGNOSIS — I130 Hypertensive heart and chronic kidney disease with heart failure and stage 1 through stage 4 chronic kidney disease, or unspecified chronic kidney disease: Secondary | ICD-10-CM | POA: Diagnosis not present

## 2017-10-16 DIAGNOSIS — J441 Chronic obstructive pulmonary disease with (acute) exacerbation: Secondary | ICD-10-CM | POA: Diagnosis not present

## 2017-10-16 NOTE — Telephone Encounter (Signed)
done

## 2017-10-16 NOTE — Telephone Encounter (Signed)
ok 

## 2017-10-16 NOTE — Telephone Encounter (Signed)
Larena Glassman with [facility] called back because she forgot to ask for updated written orders for pt's oxygen. Larena Glassman stated they need an order for pt to be on 4 litters per minute of oxygen. Please advise. Thanks TNP

## 2017-10-16 NOTE — Telephone Encounter (Signed)
Phillip Allison from [facility] called to get an order faxed to [phone] STAT. For a flutter valve. Also needs a verbal order to provide and teach insensitive spirometry to patient. Trisha's cb [phone]. Please advise?

## 2017-10-21 DIAGNOSIS — J181 Lobar pneumonia, unspecified organism: Secondary | ICD-10-CM | POA: Diagnosis not present

## 2017-10-21 DIAGNOSIS — I252 Old myocardial infarction: Secondary | ICD-10-CM | POA: Diagnosis not present

## 2017-10-21 DIAGNOSIS — I2510 Atherosclerotic heart disease of native coronary artery without angina pectoris: Secondary | ICD-10-CM | POA: Diagnosis not present

## 2017-10-21 DIAGNOSIS — I130 Hypertensive heart and chronic kidney disease with heart failure and stage 1 through stage 4 chronic kidney disease, or unspecified chronic kidney disease: Secondary | ICD-10-CM | POA: Diagnosis not present

## 2017-10-21 DIAGNOSIS — J440 Chronic obstructive pulmonary disease with acute lower respiratory infection: Secondary | ICD-10-CM | POA: Diagnosis not present

## 2017-10-21 DIAGNOSIS — J441 Chronic obstructive pulmonary disease with (acute) exacerbation: Secondary | ICD-10-CM | POA: Diagnosis not present

## 2017-10-22 ENCOUNTER — Telehealth: Payer: Self-pay | Admitting: Family Medicine

## 2017-10-22 NOTE — Telephone Encounter (Signed)
error 

## 2017-10-23 DIAGNOSIS — J181 Lobar pneumonia, unspecified organism: Secondary | ICD-10-CM | POA: Diagnosis not present

## 2017-10-23 DIAGNOSIS — I130 Hypertensive heart and chronic kidney disease with heart failure and stage 1 through stage 4 chronic kidney disease, or unspecified chronic kidney disease: Secondary | ICD-10-CM | POA: Diagnosis not present

## 2017-10-23 DIAGNOSIS — J441 Chronic obstructive pulmonary disease with (acute) exacerbation: Secondary | ICD-10-CM | POA: Diagnosis not present

## 2017-10-23 DIAGNOSIS — J440 Chronic obstructive pulmonary disease with acute lower respiratory infection: Secondary | ICD-10-CM | POA: Diagnosis not present

## 2017-10-23 DIAGNOSIS — I2510 Atherosclerotic heart disease of native coronary artery without angina pectoris: Secondary | ICD-10-CM | POA: Diagnosis not present

## 2017-10-23 DIAGNOSIS — I252 Old myocardial infarction: Secondary | ICD-10-CM | POA: Diagnosis not present

## 2017-10-27 DIAGNOSIS — R0602 Shortness of breath: Secondary | ICD-10-CM | POA: Diagnosis not present

## 2017-10-27 DIAGNOSIS — I2510 Atherosclerotic heart disease of native coronary artery without angina pectoris: Secondary | ICD-10-CM | POA: Diagnosis not present

## 2017-10-27 DIAGNOSIS — I252 Old myocardial infarction: Secondary | ICD-10-CM | POA: Diagnosis not present

## 2017-10-27 DIAGNOSIS — I5022 Chronic systolic (congestive) heart failure: Secondary | ICD-10-CM | POA: Diagnosis not present

## 2017-10-27 DIAGNOSIS — I130 Hypertensive heart and chronic kidney disease with heart failure and stage 1 through stage 4 chronic kidney disease, or unspecified chronic kidney disease: Secondary | ICD-10-CM | POA: Diagnosis not present

## 2017-10-27 DIAGNOSIS — Z515 Encounter for palliative care: Secondary | ICD-10-CM | POA: Diagnosis not present

## 2017-10-27 DIAGNOSIS — J181 Lobar pneumonia, unspecified organism: Secondary | ICD-10-CM | POA: Diagnosis not present

## 2017-10-27 DIAGNOSIS — J440 Chronic obstructive pulmonary disease with acute lower respiratory infection: Secondary | ICD-10-CM | POA: Diagnosis not present

## 2017-10-27 DIAGNOSIS — J441 Chronic obstructive pulmonary disease with (acute) exacerbation: Secondary | ICD-10-CM | POA: Diagnosis not present

## 2017-10-27 DIAGNOSIS — J449 Chronic obstructive pulmonary disease, unspecified: Secondary | ICD-10-CM | POA: Diagnosis not present

## 2017-10-27 DIAGNOSIS — R609 Edema, unspecified: Secondary | ICD-10-CM | POA: Diagnosis not present

## 2017-10-28 ENCOUNTER — Ambulatory Visit
Admission: RE | Admit: 2017-10-28 | Discharge: 2017-10-28 | Disposition: A | Discharge status: Home / Self Care | Payer: Medicare Other | Source: Ambulatory Visit | Attending: Family Medicine | Admitting: Family Medicine

## 2017-10-28 ENCOUNTER — Ambulatory Visit (INDEPENDENT_AMBULATORY_CARE_PROVIDER_SITE_OTHER): Payer: Medicare Other | Admitting: Family Medicine

## 2017-10-28 VITALS — BP 146/74 | HR 76 | Temp 98.1°F | Resp 18 | Wt 175.0 lb

## 2017-10-28 DIAGNOSIS — I255 Ischemic cardiomyopathy: Secondary | ICD-10-CM | POA: Diagnosis not present

## 2017-10-28 DIAGNOSIS — J440 Chronic obstructive pulmonary disease with acute lower respiratory infection: Secondary | ICD-10-CM | POA: Diagnosis not present

## 2017-10-28 DIAGNOSIS — Z09 Encounter for follow-up examination after completed treatment for conditions other than malignant neoplasm: Secondary | ICD-10-CM | POA: Diagnosis not present

## 2017-10-28 DIAGNOSIS — J449 Chronic obstructive pulmonary disease, unspecified: Secondary | ICD-10-CM

## 2017-10-28 DIAGNOSIS — N183 Chronic kidney disease, stage 3 unspecified: Secondary | ICD-10-CM

## 2017-10-28 DIAGNOSIS — R918 Other nonspecific abnormal finding of lung field: Secondary | ICD-10-CM | POA: Insufficient documentation

## 2017-10-28 DIAGNOSIS — Z8701 Personal history of pneumonia (recurrent): Secondary | ICD-10-CM | POA: Diagnosis not present

## 2017-10-28 DIAGNOSIS — J189 Pneumonia, unspecified organism: Secondary | ICD-10-CM

## 2017-10-28 DIAGNOSIS — Z8679 Personal history of other diseases of the circulatory system: Secondary | ICD-10-CM

## 2017-10-28 DIAGNOSIS — Z95810 Presence of automatic (implantable) cardiac defibrillator: Secondary | ICD-10-CM | POA: Diagnosis not present

## 2017-10-28 NOTE — Progress Notes (Signed)
Phillip Allison.  MRN: [id number] DOB: May 14, 1941  Subjective:  HPI   The patient is a 77 year old male who presents today for follow up of his COPD, recent hospital visit for respiratory failure and pneumonia.Transition of care visit. Patient was seen at [facility] ER on 10/07/17 for said diagnosis.  He was instructed that day to discontinue his Lisinopril, continue his other medication and start Prednisone 10 mg taper.  Patient Active Problem List   Diagnosis Date Noted  . Acute on chronic respiratory failure with hypoxia and hypercapnia ([place]) 10/08/2017  . COPD (chronic obstructive pulmonary disease) ([place]) 03/11/2016  . History of tobacco use 03/13/2015  . History of atrial fibrillation 03/13/2015  . Chronic systolic heart failure ([place]) 03/13/2015  . Intraventricular block 03/13/2015  . 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[place]) 03/10/2015  . Cardiovascular disease 03/10/2015  . Arteriosclerosis of coronary artery 03/10/2015  . CAFL (chronic airflow limitation) ([place]) 03/10/2015  . Chronic kidney disease (CKD), stage III (moderate) ([place]) 03/10/2015  . Depression, major, single episode, mild ([place]) 03/10/2015  . 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 Acid reflux 03/10/2015  . Heart & renal disease, hypertensive, with heart failure ([place]) 03/10/2015  . Cardiomyopathy, ischemic 03/10/2015  . 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[place]) 03/10/2015  . Hypercholesterolemia without hypertriglyceridemia 03/10/2015  . Cardiac defibrillator in place 01/25/2015  . MI (mitral incompetence) 11/01/2014  . Benign essential HTN 10/17/2014    Past Medical History:  Diagnosis Date  . CHF (congestive heart failure) ([place])   . COPD (chronic obstructive pulmonary disease) ([place])   . Depression   . Emphysema lung ([place])   . Hypercholesterolemia   . Hypertension   . Myocardial infarction ([place])   . On home oxygen therapy    2 liters at night and as needed  . Pneumonia    in the past  . Presence of permanent  cardiac pacemaker   . Shortness of breath dyspnea    with exertion    Social History   Socioeconomic History  . Marital status: Widowed    Spouse name: Not on file  . Number of children: 3  . Years of education: Not on file  . Highest education level: Associate degree: occupational, Hotel manager, or vocational program  Occupational History  . Occupation: retired  Scientific laboratory technician  . Financial resource strain: Not hard at all  . Food insecurity:    Worry: Never true    Inability: Never true  . Transportation needs:    Medical: No    Non-medical: No  Tobacco Use  . Smoking status: Former Smoker    Packs/day: 0.75    Years: 54.00    Pack years: 40.50    Types: Cigarettes    Last attempt to quit: 2014    Years since quitting: 5.3  . Smokeless tobacco: Never Used  . Tobacco comment: 2014?  Substance and Sexual Activity  . Alcohol use: No  . Drug use: No  . Sexual activity: Not Currently  Lifestyle  . Physical activity:    Days per week: Not on file    Minutes per session: Not on file  . Stress: Only a little  Relationships  . Social connections:    Talks on phone: Not on file    Gets together: Not on file    Attends religious service: Not on file    Active member of club or organization: Not on file    Attends meetings of clubs or organizations: Not on file  Relationship status: Not on file  . Intimate partner violence:    Fear of current or ex partner: Not on file    Emotionally abused: Not on file    Physically abused: Not on file    Forced sexual activity: Not on file  Other Topics Concern  . Not on file  Social History Narrative  . Not on file    Outpatient Encounter Medications as of 10/28/2017  Medication Sig  . albuterol (PROVENTIL) (2.5 MG/3ML) 0.083% nebulizer solution Take 3 mLs (2.5 mg total) by nebulization every 4 (four) hours as needed for wheezing or shortness of breath.  [place] Kitchen amLODipine (NORVASC) 5 MG tablet Take 1 tablet (5 mg total) by mouth daily.    [place] Kitchen aspirin 81 MG tablet Take 81 mg by mouth at bedtime.   . carvedilol (COREG) 12.5 MG tablet Take 12.5 mg by mouth 2 (two) times daily with a meal.   . furosemide (LASIX) 20 MG tablet Take 20 mg by mouth daily.   . OXYGEN Inhale into the lungs.  [place] Kitchen PARoxetine (PAXIL) 20 MG tablet TAKE 1 TABLET(20 MG) BY MOUTH AT BEDTIME  . pravastatin (PRAVACHOL) 40 MG tablet TAKE 1 TABLET BY MOUTH AT BEDTIME  . [DISCONTINUED] azithromycin (ZITHROMAX) 250 MG tablet Take daily  . [DISCONTINUED] cefdinir (OMNICEF) 300 MG capsule Take 1 capsule (300 mg total) by mouth every 12 (twelve) hours.  . [DISCONTINUED] predniSONE (DELTASONE) 10 MG tablet Start 50 mg daily then taper by 10 mg daily then stop   No facility-administered encounter medications on file as of 10/28/2017.     No Known Allergies  Review of Systems  Constitutional: Positive for malaise/fatigue. Negative for fever.  Eyes: Negative.   Respiratory: Positive for cough (occ) and shortness of breath (chronic, on O2 at 2LPM). Negative for wheezing.   Cardiovascular: Negative for chest pain, palpitations, orthopnea, claudication and leg swelling.  Gastrointestinal: Negative.   Skin: Negative.   Endo/Heme/Allergies: Negative.   Psychiatric/Behavioral: Negative.     Objective:  BP (!) 146/74 (BP Location: Right Arm, Patient Position: Sitting, Cuff Size: Normal)   Pulse 76   Temp 98.1 F (36.7 C) (Oral)   Resp 18   Wt 175 lb (79.4 kg)   BMI 24.41 kg/m   Physical Exam  Constitutional: He is oriented to person, place, and time and well-developed, well-nourished, and in no distress.  HENT:  Head: Normocephalic and atraumatic.  Right Ear: External ear normal.  Left Ear: External ear normal.  Nose: Nose normal.  Eyes: Conjunctivae are normal.  Cardiovascular: Normal rate, regular rhythm and normal heart sounds.  Pulmonary/Chest: Effort normal and breath sounds normal.  Decreased breath sounds. Minimal wheezing.  Abdominal: Soft.   Neurological: He is alert and oriented to person, place, and time. Gait normal. GCS score is 15.  Skin: Skin is warm and dry.  Psychiatric: Mood, memory, affect and judgment normal.    Assessment and Plan :  1. Pneumonia of left lung due to infectious organism, unspecified part of lung  - DG Chest 2 View; Future - CBC with Differential/Platelet  2. Chronic kidney disease (CKD), stage III (moderate) (HCC)  - Renal function panel 3.Severe COPD Now has NIV with him.Consider Palliative care/Hospice for any deterioration. 4.Ischemic Cardiomyopathy   I have done the exam and reviewed the chart and it is accurate to the best of my knowledge. Development worker, community has been used and  any errors in dictation or transcription are unintentional. Miguel Aschoff M.D. [facility]

## 2017-10-29 LAB — RENAL FUNCTION PANEL
ALBUMIN: 3.8 g/dL (ref 3.5–4.8)
BUN / CREAT RATIO: 16 (ref 10–24)
BUN: 30 mg/dL — AB (ref 8–27)
CHLORIDE: 100 mmol/L (ref 96–106)
CO2: 26 mmol/L (ref 20–29)
CREATININE: 1.88 mg/dL — AB (ref 0.76–1.27)
Calcium: 9.3 mg/dL (ref 8.6–10.2)
GFR calc non Af Amer: 34 mL/min/{1.73_m2} — ABNORMAL LOW (ref >59)
GFR, EST AFRICAN AMERICAN: 40 mL/min/{1.73_m2} — AB (ref >59)
Glucose: 84 mg/dL (ref 65–99)
Phosphorus: 4.1 mg/dL (ref 2.5–4.5)
Potassium: 5.1 mmol/L (ref 3.5–5.2)
Sodium: 141 mmol/L (ref 134–144)

## 2017-10-29 LAB — CBC WITH DIFFERENTIAL/PLATELET
BASOS: 1 %
Basophils Absolute: 0 10*3/uL (ref 0.0–0.2)
EOS (ABSOLUTE): 0.4 10*3/uL (ref 0.0–0.4)
EOS: 5 %
HEMATOCRIT: 37.2 % — AB (ref 37.5–51.0)
HEMOGLOBIN: 12.2 g/dL — AB (ref 13.0–17.7)
Immature Grans (Abs): 0 10*3/uL (ref 0.0–0.1)
Immature Granulocytes: 0 %
LYMPHS ABS: 1.9 10*3/uL (ref 0.7–3.1)
Lymphs: 24 %
MCH: 29 pg (ref 26.6–33.0)
MCHC: 32.8 g/dL (ref 31.5–35.7)
MCV: 88 fL (ref 79–97)
MONOCYTES: 10 %
Monocytes Absolute: 0.8 10*3/uL (ref 0.1–0.9)
NEUTROS ABS: 5 10*3/uL (ref 1.4–7.0)
Neutrophils: 60 %
Platelets: 213 10*3/uL (ref 150–379)
RBC: 4.21 x10E6/uL (ref 4.14–5.80)
RDW: 13.1 % (ref 12.3–15.4)
WBC: 8.2 10*3/uL (ref 3.4–10.8)

## 2017-10-30 ENCOUNTER — Telehealth: Payer: Self-pay | Admitting: Family Medicine

## 2017-10-30 DIAGNOSIS — J441 Chronic obstructive pulmonary disease with (acute) exacerbation: Secondary | ICD-10-CM | POA: Diagnosis not present

## 2017-10-30 DIAGNOSIS — J440 Chronic obstructive pulmonary disease with acute lower respiratory infection: Secondary | ICD-10-CM | POA: Diagnosis not present

## 2017-10-30 DIAGNOSIS — I2510 Atherosclerotic heart disease of native coronary artery without angina pectoris: Secondary | ICD-10-CM | POA: Diagnosis not present

## 2017-10-30 DIAGNOSIS — I130 Hypertensive heart and chronic kidney disease with heart failure and stage 1 through stage 4 chronic kidney disease, or unspecified chronic kidney disease: Secondary | ICD-10-CM | POA: Diagnosis not present

## 2017-10-30 DIAGNOSIS — J181 Lobar pneumonia, unspecified organism: Secondary | ICD-10-CM | POA: Diagnosis not present

## 2017-10-30 DIAGNOSIS — I252 Old myocardial infarction: Secondary | ICD-10-CM | POA: Diagnosis not present

## 2017-10-30 NOTE — Telephone Encounter (Signed)
I sent an order for pallitive care on 10/15/17 to be signed.I have not received it back.Is this service still needed ?

## 2017-11-03 DIAGNOSIS — I2510 Atherosclerotic heart disease of native coronary artery without angina pectoris: Secondary | ICD-10-CM | POA: Diagnosis not present

## 2017-11-03 DIAGNOSIS — J441 Chronic obstructive pulmonary disease with (acute) exacerbation: Secondary | ICD-10-CM | POA: Diagnosis not present

## 2017-11-03 DIAGNOSIS — J181 Lobar pneumonia, unspecified organism: Secondary | ICD-10-CM | POA: Diagnosis not present

## 2017-11-03 DIAGNOSIS — I252 Old myocardial infarction: Secondary | ICD-10-CM | POA: Diagnosis not present

## 2017-11-03 DIAGNOSIS — I130 Hypertensive heart and chronic kidney disease with heart failure and stage 1 through stage 4 chronic kidney disease, or unspecified chronic kidney disease: Secondary | ICD-10-CM | POA: Diagnosis not present

## 2017-11-03 DIAGNOSIS — J440 Chronic obstructive pulmonary disease with acute lower respiratory infection: Secondary | ICD-10-CM | POA: Diagnosis not present

## 2017-11-07 DIAGNOSIS — I252 Old myocardial infarction: Secondary | ICD-10-CM | POA: Diagnosis not present

## 2017-11-07 DIAGNOSIS — I130 Hypertensive heart and chronic kidney disease with heart failure and stage 1 through stage 4 chronic kidney disease, or unspecified chronic kidney disease: Secondary | ICD-10-CM | POA: Diagnosis not present

## 2017-11-07 DIAGNOSIS — J181 Lobar pneumonia, unspecified organism: Secondary | ICD-10-CM | POA: Diagnosis not present

## 2017-11-07 DIAGNOSIS — J441 Chronic obstructive pulmonary disease with (acute) exacerbation: Secondary | ICD-10-CM | POA: Diagnosis not present

## 2017-11-07 DIAGNOSIS — J440 Chronic obstructive pulmonary disease with acute lower respiratory infection: Secondary | ICD-10-CM | POA: Diagnosis not present

## 2017-11-07 DIAGNOSIS — I2510 Atherosclerotic heart disease of native coronary artery without angina pectoris: Secondary | ICD-10-CM | POA: Diagnosis not present

## 2017-11-10 DIAGNOSIS — J441 Chronic obstructive pulmonary disease with (acute) exacerbation: Secondary | ICD-10-CM | POA: Diagnosis not present

## 2017-11-10 DIAGNOSIS — J181 Lobar pneumonia, unspecified organism: Secondary | ICD-10-CM | POA: Diagnosis not present

## 2017-11-10 DIAGNOSIS — J440 Chronic obstructive pulmonary disease with acute lower respiratory infection: Secondary | ICD-10-CM | POA: Diagnosis not present

## 2017-11-10 DIAGNOSIS — I130 Hypertensive heart and chronic kidney disease with heart failure and stage 1 through stage 4 chronic kidney disease, or unspecified chronic kidney disease: Secondary | ICD-10-CM | POA: Diagnosis not present

## 2017-11-10 DIAGNOSIS — I252 Old myocardial infarction: Secondary | ICD-10-CM | POA: Diagnosis not present

## 2017-11-10 DIAGNOSIS — I2510 Atherosclerotic heart disease of native coronary artery without angina pectoris: Secondary | ICD-10-CM | POA: Diagnosis not present

## 2017-11-14 DIAGNOSIS — I130 Hypertensive heart and chronic kidney disease with heart failure and stage 1 through stage 4 chronic kidney disease, or unspecified chronic kidney disease: Secondary | ICD-10-CM | POA: Diagnosis not present

## 2017-11-14 DIAGNOSIS — I2510 Atherosclerotic heart disease of native coronary artery without angina pectoris: Secondary | ICD-10-CM | POA: Diagnosis not present

## 2017-11-14 DIAGNOSIS — J181 Lobar pneumonia, unspecified organism: Secondary | ICD-10-CM | POA: Diagnosis not present

## 2017-11-14 DIAGNOSIS — J441 Chronic obstructive pulmonary disease with (acute) exacerbation: Secondary | ICD-10-CM | POA: Diagnosis not present

## 2017-11-14 DIAGNOSIS — J440 Chronic obstructive pulmonary disease with acute lower respiratory infection: Secondary | ICD-10-CM | POA: Diagnosis not present

## 2017-11-14 DIAGNOSIS — I252 Old myocardial infarction: Secondary | ICD-10-CM | POA: Diagnosis not present

## 2017-11-17 DIAGNOSIS — J441 Chronic obstructive pulmonary disease with (acute) exacerbation: Secondary | ICD-10-CM | POA: Diagnosis not present

## 2017-11-17 DIAGNOSIS — I252 Old myocardial infarction: Secondary | ICD-10-CM | POA: Diagnosis not present

## 2017-11-17 DIAGNOSIS — J440 Chronic obstructive pulmonary disease with acute lower respiratory infection: Secondary | ICD-10-CM | POA: Diagnosis not present

## 2017-11-17 DIAGNOSIS — I130 Hypertensive heart and chronic kidney disease with heart failure and stage 1 through stage 4 chronic kidney disease, or unspecified chronic kidney disease: Secondary | ICD-10-CM | POA: Diagnosis not present

## 2017-11-17 DIAGNOSIS — I2510 Atherosclerotic heart disease of native coronary artery without angina pectoris: Secondary | ICD-10-CM | POA: Diagnosis not present

## 2017-11-17 DIAGNOSIS — J181 Lobar pneumonia, unspecified organism: Secondary | ICD-10-CM | POA: Diagnosis not present

## 2017-11-18 DIAGNOSIS — I2510 Atherosclerotic heart disease of native coronary artery without angina pectoris: Secondary | ICD-10-CM | POA: Diagnosis not present

## 2017-11-18 DIAGNOSIS — I10 Essential (primary) hypertension: Secondary | ICD-10-CM | POA: Diagnosis not present

## 2017-11-18 DIAGNOSIS — I714 Abdominal aortic aneurysm, without rupture: Secondary | ICD-10-CM | POA: Diagnosis not present

## 2017-11-18 DIAGNOSIS — I5022 Chronic systolic (congestive) heart failure: Secondary | ICD-10-CM | POA: Diagnosis not present

## 2017-11-18 DIAGNOSIS — I255 Ischemic cardiomyopathy: Secondary | ICD-10-CM | POA: Diagnosis not present

## 2017-11-20 DIAGNOSIS — J181 Lobar pneumonia, unspecified organism: Secondary | ICD-10-CM | POA: Diagnosis not present

## 2017-11-20 DIAGNOSIS — J441 Chronic obstructive pulmonary disease with (acute) exacerbation: Secondary | ICD-10-CM | POA: Diagnosis not present

## 2017-11-20 DIAGNOSIS — I252 Old myocardial infarction: Secondary | ICD-10-CM | POA: Diagnosis not present

## 2017-11-20 DIAGNOSIS — J440 Chronic obstructive pulmonary disease with acute lower respiratory infection: Secondary | ICD-10-CM | POA: Diagnosis not present

## 2017-11-20 DIAGNOSIS — I130 Hypertensive heart and chronic kidney disease with heart failure and stage 1 through stage 4 chronic kidney disease, or unspecified chronic kidney disease: Secondary | ICD-10-CM | POA: Diagnosis not present

## 2017-11-20 DIAGNOSIS — I2510 Atherosclerotic heart disease of native coronary artery without angina pectoris: Secondary | ICD-10-CM | POA: Diagnosis not present

## 2017-11-27 DIAGNOSIS — I2510 Atherosclerotic heart disease of native coronary artery without angina pectoris: Secondary | ICD-10-CM | POA: Diagnosis not present

## 2017-11-27 DIAGNOSIS — I130 Hypertensive heart and chronic kidney disease with heart failure and stage 1 through stage 4 chronic kidney disease, or unspecified chronic kidney disease: Secondary | ICD-10-CM | POA: Diagnosis not present

## 2017-11-27 DIAGNOSIS — J440 Chronic obstructive pulmonary disease with acute lower respiratory infection: Secondary | ICD-10-CM | POA: Diagnosis not present

## 2017-11-27 DIAGNOSIS — J441 Chronic obstructive pulmonary disease with (acute) exacerbation: Secondary | ICD-10-CM | POA: Diagnosis not present

## 2017-11-27 DIAGNOSIS — J181 Lobar pneumonia, unspecified organism: Secondary | ICD-10-CM | POA: Diagnosis not present

## 2017-11-27 DIAGNOSIS — I252 Old myocardial infarction: Secondary | ICD-10-CM | POA: Diagnosis not present

## 2017-12-02 DIAGNOSIS — J181 Lobar pneumonia, unspecified organism: Secondary | ICD-10-CM | POA: Diagnosis not present

## 2017-12-02 DIAGNOSIS — I252 Old myocardial infarction: Secondary | ICD-10-CM | POA: Diagnosis not present

## 2017-12-02 DIAGNOSIS — J441 Chronic obstructive pulmonary disease with (acute) exacerbation: Secondary | ICD-10-CM | POA: Diagnosis not present

## 2017-12-02 DIAGNOSIS — I130 Hypertensive heart and chronic kidney disease with heart failure and stage 1 through stage 4 chronic kidney disease, or unspecified chronic kidney disease: Secondary | ICD-10-CM | POA: Diagnosis not present

## 2017-12-02 DIAGNOSIS — J440 Chronic obstructive pulmonary disease with acute lower respiratory infection: Secondary | ICD-10-CM | POA: Diagnosis not present

## 2017-12-02 DIAGNOSIS — I2510 Atherosclerotic heart disease of native coronary artery without angina pectoris: Secondary | ICD-10-CM | POA: Diagnosis not present

## 2017-12-04 DIAGNOSIS — I340 Nonrheumatic mitral (valve) insufficiency: Secondary | ICD-10-CM | POA: Diagnosis not present

## 2017-12-04 DIAGNOSIS — I5022 Chronic systolic (congestive) heart failure: Secondary | ICD-10-CM | POA: Diagnosis not present

## 2017-12-04 DIAGNOSIS — Z95810 Presence of automatic (implantable) cardiac defibrillator: Secondary | ICD-10-CM | POA: Diagnosis not present

## 2017-12-04 DIAGNOSIS — I255 Ischemic cardiomyopathy: Secondary | ICD-10-CM | POA: Diagnosis not present

## 2017-12-04 DIAGNOSIS — I714 Abdominal aortic aneurysm, without rupture: Secondary | ICD-10-CM | POA: Diagnosis not present

## 2017-12-04 DIAGNOSIS — I2510 Atherosclerotic heart disease of native coronary artery without angina pectoris: Secondary | ICD-10-CM | POA: Diagnosis not present

## 2017-12-09 ENCOUNTER — Telehealth: Payer: Self-pay | Admitting: Nurse Practitioner

## 2017-12-15 ENCOUNTER — Other Ambulatory Visit: Payer: Self-pay

## 2017-12-15 ENCOUNTER — Inpatient Hospital Stay
Admission: EM | Admit: 2017-12-15 | Discharge: 2017-12-18 | DRG: 871 | Disposition: A | Discharge status: Home Health | Payer: Medicare Other | Attending: Internal Medicine | Admitting: Internal Medicine

## 2017-12-15 ENCOUNTER — Encounter: Payer: Self-pay | Admitting: Emergency Medicine

## 2017-12-15 ENCOUNTER — Emergency Department: Payer: Medicare Other

## 2017-12-15 DIAGNOSIS — Z7982 Long term (current) use of aspirin: Secondary | ICD-10-CM | POA: Diagnosis not present

## 2017-12-15 DIAGNOSIS — E785 Hyperlipidemia, unspecified: Secondary | ICD-10-CM | POA: Diagnosis present

## 2017-12-15 DIAGNOSIS — F329 Major depressive disorder, single episode, unspecified: Secondary | ICD-10-CM | POA: Diagnosis present

## 2017-12-15 DIAGNOSIS — I252 Old myocardial infarction: Secondary | ICD-10-CM | POA: Diagnosis not present

## 2017-12-15 DIAGNOSIS — R0602 Shortness of breath: Secondary | ICD-10-CM | POA: Diagnosis not present

## 2017-12-15 DIAGNOSIS — J9621 Acute and chronic respiratory failure with hypoxia: Secondary | ICD-10-CM | POA: Diagnosis present

## 2017-12-15 DIAGNOSIS — J181 Lobar pneumonia, unspecified organism: Secondary | ICD-10-CM | POA: Diagnosis present

## 2017-12-15 DIAGNOSIS — R0603 Acute respiratory distress: Secondary | ICD-10-CM | POA: Diagnosis not present

## 2017-12-15 DIAGNOSIS — I248 Other forms of acute ischemic heart disease: Secondary | ICD-10-CM | POA: Diagnosis present

## 2017-12-15 DIAGNOSIS — Z961 Presence of intraocular lens: Secondary | ICD-10-CM | POA: Diagnosis present

## 2017-12-15 DIAGNOSIS — Z9981 Dependence on supplemental oxygen: Secondary | ICD-10-CM | POA: Diagnosis not present

## 2017-12-15 DIAGNOSIS — A419 Sepsis, unspecified organism: Secondary | ICD-10-CM | POA: Diagnosis not present

## 2017-12-15 DIAGNOSIS — J9600 Acute respiratory failure, unspecified whether with hypoxia or hypercapnia: Secondary | ICD-10-CM | POA: Diagnosis not present

## 2017-12-15 DIAGNOSIS — D72829 Elevated white blood cell count, unspecified: Secondary | ICD-10-CM

## 2017-12-15 DIAGNOSIS — Z9841 Cataract extraction status, right eye: Secondary | ICD-10-CM | POA: Diagnosis not present

## 2017-12-15 DIAGNOSIS — J189 Pneumonia, unspecified organism: Secondary | ICD-10-CM

## 2017-12-15 DIAGNOSIS — I5023 Acute on chronic systolic (congestive) heart failure: Secondary | ICD-10-CM | POA: Diagnosis present

## 2017-12-15 DIAGNOSIS — Z87891 Personal history of nicotine dependence: Secondary | ICD-10-CM | POA: Diagnosis not present

## 2017-12-15 DIAGNOSIS — N183 Chronic kidney disease, stage 3 (moderate): Secondary | ICD-10-CM | POA: Diagnosis present

## 2017-12-15 DIAGNOSIS — Z66 Do not resuscitate: Secondary | ICD-10-CM | POA: Diagnosis present

## 2017-12-15 DIAGNOSIS — Z79899 Other long term (current) drug therapy: Secondary | ICD-10-CM

## 2017-12-15 DIAGNOSIS — J9601 Acute respiratory failure with hypoxia: Secondary | ICD-10-CM | POA: Diagnosis not present

## 2017-12-15 DIAGNOSIS — J440 Chronic obstructive pulmonary disease with acute lower respiratory infection: Secondary | ICD-10-CM | POA: Diagnosis present

## 2017-12-15 DIAGNOSIS — N179 Acute kidney failure, unspecified: Secondary | ICD-10-CM | POA: Diagnosis present

## 2017-12-15 DIAGNOSIS — R531 Weakness: Secondary | ICD-10-CM | POA: Diagnosis not present

## 2017-12-15 DIAGNOSIS — E7800 Pure hypercholesterolemia, unspecified: Secondary | ICD-10-CM | POA: Diagnosis present

## 2017-12-15 DIAGNOSIS — J811 Chronic pulmonary edema: Secondary | ICD-10-CM | POA: Diagnosis not present

## 2017-12-15 DIAGNOSIS — I130 Hypertensive heart and chronic kidney disease with heart failure and stage 1 through stage 4 chronic kidney disease, or unspecified chronic kidney disease: Secondary | ICD-10-CM | POA: Diagnosis present

## 2017-12-15 DIAGNOSIS — J810 Acute pulmonary edema: Secondary | ICD-10-CM | POA: Diagnosis not present

## 2017-12-15 LAB — BASIC METABOLIC PANEL
Anion gap: 7 (ref 5–15)
BUN: 32 mg/dL — ABNORMAL HIGH (ref 6–20)
CHLORIDE: 104 mmol/L (ref 101–111)
CO2: 29 mmol/L (ref 22–32)
CREATININE: 1.97 mg/dL — AB (ref 0.61–1.24)
Calcium: 8.6 mg/dL — ABNORMAL LOW (ref 8.9–10.3)
GFR calc Af Amer: 37 mL/min — ABNORMAL LOW (ref >60)
GFR calc non Af Amer: 31 mL/min — ABNORMAL LOW (ref >60)
GLUCOSE: 222 mg/dL — AB (ref 65–99)
Potassium: 4.7 mmol/L (ref 3.5–5.1)
Sodium: 140 mmol/L (ref 135–145)

## 2017-12-15 LAB — CBC WITH DIFFERENTIAL/PLATELET
Basophils Absolute: 0 10*3/uL (ref 0–0.1)
Basophils Relative: 0 %
Eosinophils Absolute: 0.6 10*3/uL (ref 0–0.7)
Eosinophils Relative: 4 %
HEMATOCRIT: 41.8 % (ref 40.0–52.0)
Hemoglobin: 13.6 g/dL (ref 13.0–18.0)
LYMPHS ABS: 3.9 10*3/uL — AB (ref 1.0–3.6)
LYMPHS PCT: 23 %
MCH: 29.4 pg (ref 26.0–34.0)
MCHC: 32.5 g/dL (ref 32.0–36.0)
MCV: 90.5 fL (ref 80.0–100.0)
MONOS PCT: 5 %
Monocytes Absolute: 0.8 10*3/uL (ref 0.2–1.0)
NEUTROS ABS: 11.5 10*3/uL — AB (ref 1.4–6.5)
Neutrophils Relative %: 68 %
Platelets: 191 10*3/uL (ref 150–440)
RBC: 4.62 MIL/uL (ref 4.40–5.90)
RDW: 13.8 % (ref 11.5–14.5)
WBC: 16.8 10*3/uL — ABNORMAL HIGH (ref 3.8–10.6)

## 2017-12-15 LAB — BRAIN NATRIURETIC PEPTIDE: B Natriuretic Peptide: 2644 pg/mL — ABNORMAL HIGH (ref 0.0–100.0)

## 2017-12-15 LAB — LACTIC ACID, PLASMA: Lactic Acid, Venous: 0.6 mmol/L (ref 0.5–1.9)

## 2017-12-15 LAB — TROPONIN I: Troponin I: 0.03 ng/mL (ref <0.03)

## 2017-12-15 MED ORDER — SODIUM CHLORIDE 0.9 % IV SOLN
2.0000 g | Freq: Once | INTRAVENOUS | Status: AC
  Administered 2017-12-15: 2 g via INTRAVENOUS
  Filled 2017-12-15: qty 2

## 2017-12-15 MED ORDER — VANCOMYCIN HCL IN DEXTROSE 1-5 GM/200ML-% IV SOLN
1000.0000 mg | Freq: Once | INTRAVENOUS | Status: AC
  Administered 2017-12-15: 1000 mg via INTRAVENOUS
  Filled 2017-12-15: qty 200

## 2017-12-15 MED ORDER — FUROSEMIDE 10 MG/ML IJ SOLN
40.0000 mg | Freq: Once | INTRAMUSCULAR | Status: AC
  Administered 2017-12-15: 40 mg via INTRAVENOUS
  Filled 2017-12-15: qty 4

## 2017-12-15 MED ORDER — SODIUM CHLORIDE 0.9 % IV SOLN
2.0000 g | Freq: Two times a day (BID) | INTRAVENOUS | Status: DC
  Administered 2017-12-16: 2 g via INTRAVENOUS
  Filled 2017-12-15 (×4): qty 2

## 2017-12-15 MED ORDER — VANCOMYCIN HCL IN DEXTROSE 1-5 GM/200ML-% IV SOLN
1000.0000 mg | INTRAVENOUS | Status: DC
  Administered 2017-12-16: 1000 mg via INTRAVENOUS
  Filled 2017-12-15 (×2): qty 200

## 2017-12-15 NOTE — ED Triage Notes (Signed)
PT to ED via EMS from home with resp distress. PT was found to be 70s% on RA per EMS, pt placed on CPAP and given 125 solumedrol, 3 duonebs, and 2g of mag in route. MD and RT at bedside.

## 2017-12-15 NOTE — H&P (Addendum)
[facility] NAME: Phillip Allison    MR#:  [id number]  DATE OF BIRTH:  09-22-1940  DATE OF ADMISSION:  12/15/2017  PRIMARY CARE PHYSICIAN: Jerrol Banana., MD   REQUESTING/REFERRING PHYSICIAN:   CHIEF COMPLAINT:   Chief Complaint  Patient presents with  . Respiratory Distress    HISTORY OF PRESENT ILLNESS: Phillip Allison  is a 77 y.o. male with a known history of CHF, COPD, hypertension, hyperlipidemia, coronary artery disease.  He is on continuous BiPAP at home, although, per family, he could take up to 2 hours breaks off BiPAP, during the day. Patient is lethargic and in severe respiratory distress, on BiPAP, unable to provide history.  Most of the information was taken from reviewing the medical chart and from discussion with emergency room physician and with the family. He presented to emergency room via EMS for acute onset of shortness of breath going on for the past days1-2, progressively getting worse, despite using nebulizer treatments at home and continues BiPAP.  Per family, patient has been complaining of not feeling well and some GI discomfort, due to constipation going on for the past 2 days.  She was finally able to have a large stool earlier today, but continued to be short of breath and his oxygen saturation measured at home, dropped to 50s, at times. Blood test done emergency room are remarkable for elevated WBC at 16.8.  Creatinine level is 1.97.  BNP is elevated at 2600.  Troponin level is 0.03.  EKG shows normal sinus rhythm with heart rate at 93 bpm.  Q waves in V1 V2 and V3 are noted, as stable, old findings when compared to prior EKG. Chest x-ray, reviewed by myself, is suggestive of right lower lobe infiltrate and pulmonary congestion.  PAST MEDICAL HISTORY:   Past Medical History:  Diagnosis Date  . CHF (congestive heart failure) ([place])   . COPD (chronic obstructive pulmonary disease) ([place])    . Depression   . Emphysema lung ([place])   . Hypercholesterolemia   . Hypertension   . Myocardial infarction ([place])   . On home oxygen therapy    2 liters at night and as needed  . Pneumonia    in the past  . Presence of permanent cardiac pacemaker   . Shortness of breath dyspnea    with exertion    PAST SURGICAL HISTORY:  Past Surgical History:  Procedure Laterality Date  . ABDOMINAL AORTIC ANEURYSM REPAIR    . ANGIOPLASTY     left lower extremity  . CATARACT EXTRACTION W/PHACO Right 12/18/2015   Procedure: CATARACT EXTRACTION PHACO AND INTRAOCULAR LENS PLACEMENT (IOC);  Surgeon: Estill Cotta, MD;  Location: [facility] ORS;  Service: Ophthalmology;  Laterality: Right;  [place] 01:58 AP% 23.5 CDE 50.91 fluid pack lot # [id number]  . INSERT / REPLACE / REMOVE PACEMAKER    . PACEMAKER PLACEMENT    . TONSILLECTOMY      SOCIAL HISTORY:  Social History   Tobacco Use  . Smoking status: Former Smoker    Packs/day: 0.75    Years: 54.00    Pack years: 40.50    Types: Cigarettes    Last attempt to quit: 2014    Years since quitting: 5.4  . Smokeless tobacco: Never Used  . Tobacco comment: 2014?  Substance Use Topics  . Alcohol use: No    FAMILY HISTORY:  Family History  Problem Relation Age of Onset  . Psoriasis Mother   .  Heart attack Father     DRUG ALLERGIES: No Known Allergies  REVIEW OF SYSTEMS:   Unable to obtain due to patient being lethargic, in severe respiratory distress, on BiPAP.  MEDICATIONS AT HOME:  Prior to Admission medications   Medication Sig Start Date End Date Taking? Authorizing Provider  albuterol (PROVENTIL) (2.5 MG/3ML) 0.083% nebulizer solution Take 3 mLs (2.5 mg total) by nebulization every 4 (four) hours as needed for wheezing or shortness of breath. 08/28/17   Jerrol Banana., MD  amLODipine (NORVASC) 5 MG tablet Take 1 tablet (5 mg total) by mouth daily. 10/10/17   Fritzi Mandes, MD  aspirin 81 MG tablet Take 81 mg by mouth at bedtime.   08/22/11   [provider]  carvedilol (COREG) 12.5 MG tablet Take 12.5 mg by mouth 2 (two) times daily with a meal.  08/22/11   [provider]  furosemide (LASIX) 20 MG tablet Take 20 mg by mouth daily.  08/22/11   [provider]  OXYGEN Inhale into the lungs.    [provider]  PARoxetine (PAXIL) 20 MG tablet TAKE 1 TABLET(20 MG) BY MOUTH AT BEDTIME 07/03/17   Jerrol Banana., MD  pravastatin (PRAVACHOL) 40 MG tablet TAKE 1 TABLET BY MOUTH AT BEDTIME 09/27/17   Jerrol Banana., MD      PHYSICAL EXAMINATION:   VITAL SIGNS: Blood pressure 125/60, pulse 70, temperature 98.1 F (36.7 C), temperature source Rectal, resp. rate (!) 25, SpO2 100 %.  GENERAL:  77 y.o.-year-old patient lying in the bed with no acute distress.  EYES: Pupils equal, round, reactive to light and accommodation. No scleral icterus. Extraocular muscles intact.  HEENT: Head atraumatic, normocephalic. Oropharynx and nasopharynx clear.  NECK:  Supple, no jugular venous distention. No thyroid enlargement, no tenderness.  LUNGS: Tachypnea.  Reduced breath sounds bilaterally, no wheezing, but bibasilar crackles are heard.  Positive use of accessory muscles of respiration.  CARDIOVASCULAR: S1, S2 normal. No S3/S4.  ABDOMEN: Soft, nontender, nondistended. Bowel sounds present. No organomegaly or mass.  EXTREMITIES: No pedal edema, cyanosis, or clubbing.  NEUROLOGIC: Patient is moving all his extremities, no focal weakness appreciated. PSYCHIATRIC: The patient is alert, but lethargic.  SKIN: No obvious rash, lesion, or ulcer.   LABORATORY PANEL:   CBC Recent Labs  Lab 12/15/17 2047  WBC 16.8*  HGB 13.6  HCT 41.8  PLT 191  MCV 90.5  MCH 29.4  MCHC 32.5  RDW 13.8  LYMPHSABS 3.9*  MONOABS 0.8  EOSABS 0.6  BASOSABS 0.0   ------------------------------------------------------------------------------------------------------------------  Chemistries  Recent Labs  Lab  12/15/17 2047  NA 140  K 4.7  CL 104  CO2 29  GLUCOSE 222*  BUN 32*  CREATININE 1.97*  CALCIUM 8.6*   ------------------------------------------------------------------------------------------------------------------ CrCl cannot be calculated (Unknown ideal weight.). ------------------------------------------------------------------------------------------------------------------ No results for input(s): TSH, T4TOTAL, T3FREE, THYROIDAB in the last 72 hours.  Invalid input(s): FREET3   Coagulation profile No results for input(s): INR, PROTIME in the last 168 hours. ------------------------------------------------------------------------------------------------------------------- No results for input(s): DDIMER in the last 72 hours. -------------------------------------------------------------------------------------------------------------------  Cardiac Enzymes Recent Labs  Lab 12/15/17 2047  TROPONINI 0.03*   ------------------------------------------------------------------------------------------------------------------ Invalid input(s): POCBNP  ---------------------------------------------------------------------------------------------------------------  Urinalysis    Component Value Date/Time   COLORURINE YELLOW (A) 10/08/2017 1339   APPEARANCEUR CLEAR (A) 10/08/2017 1339   APPEARANCEUR Clear 07/21/2012 1126   LABSPEC 1.015 10/08/2017 1339   LABSPEC 1.006 07/21/2012 1126   PHURINE 5.0 10/08/2017 1339   GLUCOSEU NEGATIVE 10/08/2017  1339   GLUCOSEU Negative 07/21/2012 1126   HGBUR NEGATIVE 10/08/2017 1339   BILIRUBINUR NEGATIVE 10/08/2017 1339   BILIRUBINUR Negative 07/21/2012 [place] 10/08/2017 1339   PROTEINUR 30 (A) 10/08/2017 1339   NITRITE NEGATIVE 10/08/2017 1339   LEUKOCYTESUR NEGATIVE 10/08/2017 1339   LEUKOCYTESUR Negative 07/21/2012 1126     RADIOLOGY: Dg Chest Portable 1 View  Result Date: 12/15/2017 CLINICAL DATA:  Acute  onset respiratory distress. EXAM: PORTABLE CHEST 1 VIEW COMPARISON:  10/28/2017 FINDINGS: Stable cardiomegaly with moderate atherosclerosis of the tortuous thoracic aorta. Left-sided ICD device noted with leads in the right atrium and right ventricle. Interval increase in interstitial edema since prior. Trace right effusion blunting the right lateral costophrenic angle is noted. No pneumothorax or pulmonary consolidation. No acute osseous abnormality. IMPRESSION: There has been interval development of interstitial pulmonary edema with stable cardiomegaly and atherosclerosis of the nonaneurysmal aorta. Trace right effusion. Electronically Signed   By: Ashley Royalty M.D.   On: 12/15/2017 21:11    EKG: Orders placed or performed during the hospital encounter of 12/15/17  . EKG 12-Lead  . EKG 12-Lead    IMPRESSION AND PLAN:  1.  Acute on chronic respiratory failure with hypoxia, secondary to pneumonia and acute pulmonary edema.  Will admit patient to intensive care unit.  We will continue BiPAP, DuoNebs, oxygen therapy.  Will start broad-spectrum IV antibiotics. 2.  Sepsis, secondary to pneumonia.  We will start broad-spectrum IV antibiotics and oxygen therapy. 3.  Acute pulmonary edema.  We will start gentle diuresis with Lasix IV. Will check 2Decho. 4.  RLL CAP, will start on broad-spectrum IV antibiotics, oxygen therapy and nebulizer treatments. 5.  CKD 3, creatinine is at baseline, 1.97.  We will continue to monitor kidney function closely and avoid nephrotoxic medications. 6.  Borderline elevated troponin level is 0.03, likely related to demand ischemia, from acute respiratory failure.  We will continue to monitor patient on telemetry and follow troponin levels, to rule out ACS.  We will continue patient on aspirin. 7.  Hypertension, stable, to new home medications. 8.  Hyperlipidemia, on statin.  All the records are reviewed and case discussed with ED and ICU providers. Management plans  discussed with the patient, family and they are in agreement.  CODE STATUS: DNR Code Status History    Date Active Date Inactive Code Status Order ID Comments User Context   10/08/2017 0243 10/09/2017 1625 DNR [id number]  Arta Silence, MD Inpatient    Questions for Most Recent Historical Code Status (Order [id number])    Question Answer Comment   In the event of cardiac or respiratory ARREST Do not call a "code blue"    In the event of cardiac or respiratory ARREST Do not perform Intubation, CPR, defibrillation or ACLS    In the event of cardiac or respiratory ARREST Use medication by any route, position, wound care, and other measures to relive pain and suffering. May use oxygen, suction and manual treatment of airway obstruction as needed for comfort.        TOTAL TIME TAKING CARE OF THIS PATIENT: 50 minutes.    Amelia Jo M.D on 12/15/2017 at 10:44 PM  Between 7am to 6pm - Pager - [phone]  After 6pm go to [email or web address] - password [email or web address] [facility]  [facility]  Office  [phone]  CC: Primary care physician; Jerrol Banana., MD

## 2017-12-15 NOTE — ED Notes (Signed)
Date and time results received: 12/15/17 9:36 PM   Test: Troponin Critical Value: Trop: 0.03  Name of Provider Notified: Dr. Archie Balboa

## 2017-12-15 NOTE — Progress Notes (Signed)
Chaplain received a call from the ED. Patient was en route with an ETA of 5 mins. When chaplain arrived, medical staff were attending to patient and assessing his status. Patient appeared was awake and appeared to be alert, but in respiratory distress. Chaplain asked unit secretary if any family was present; they were en route. Chaplain maintained pastoral presence outside room while patient status was assessed. Chaplain was invited into room by nursing staff; chaplain observed staff at work. Nurse informed chaplain that patient was not talking at the time. Chaplain remained near room to await family arrival, but was unable to connect. Chaplain then received urgent call to L&D.    12/15/17 2100  Clinical Encounter Type  Visited With Patient not available  Visit Type Code  .

## 2017-12-15 NOTE — ED Provider Notes (Signed)
[facility] Emergency Department Provider Note    ____________________________________________   I have reviewed the triage vital signs and the nursing notes.   HISTORY  Chief Complaint Respiratory Distress   History limited by and level 5 caveat due to acute medical condition   HPI Phillip Allison. is a 77 y.o. male who presents to the emergency department today via EMS as emergency traffic because of concerns for respiratory distress.  EMS states that family told him that he had not been feeling well for the past 2 days.  Apparently had been complaining of GI symptoms.  Today however started having more significant shortness of breath.  Patient apparently is on BiPAP almost around-the-clock.  Family was checking O2 sats and it dropped down into the high 50s at one point.  Patient himself is in significant respiratory distress and is unable to answer questions.    Per medical record review patient has a history of hospitalization a couple of months ago secondary to respiratory distress.  Past Medical History:  Diagnosis Date  . CHF (congestive heart failure) ([place])   . COPD (chronic obstructive pulmonary disease) ([place])   . Depression   . Emphysema lung ([place])   . Hypercholesterolemia   . Hypertension   . Myocardial infarction ([place])   . On home oxygen therapy    2 liters at night and as needed  . Pneumonia    in the past  . Presence of permanent cardiac pacemaker   . Shortness of breath dyspnea    with exertion    Patient Active Problem List   Diagnosis Date Noted  . Acute on chronic respiratory failure with hypoxia and hypercapnia ([place]) 10/08/2017  . COPD (chronic obstructive pulmonary disease) ([place]) 03/11/2016  . History of tobacco use 03/13/2015  . History of atrial fibrillation 03/13/2015  . Chronic systolic heart failure ([place]) 03/13/2015  . Intraventricular block 03/13/2015  . 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[place]) 03/10/2015  . Cardiovascular  disease 03/10/2015  . Arteriosclerosis of coronary artery 03/10/2015  . CAFL (chronic airflow limitation) ([place]) 03/10/2015  . Chronic kidney disease (CKD), stage III (moderate) ([place]) 03/10/2015  . Depression, major, single episode, mild ([place]) 03/10/2015  . 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 Acid reflux 03/10/2015  . Heart & renal disease, hypertensive, with heart failure ([place]) 03/10/2015  . Cardiomyopathy, ischemic 03/10/2015  . 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[place]) 03/10/2015  . Hypercholesterolemia without hypertriglyceridemia 03/10/2015  . Cardiac defibrillator in place 01/25/2015  . MI (mitral incompetence) 11/01/2014  . Benign essential HTN 10/17/2014    Past Surgical History:  Procedure Laterality Date  . ABDOMINAL AORTIC ANEURYSM REPAIR    . ANGIOPLASTY     left lower extremity  . CATARACT EXTRACTION W/PHACO Right 12/18/2015   Procedure: CATARACT EXTRACTION PHACO AND INTRAOCULAR LENS PLACEMENT (IOC);  Surgeon: Estill Cotta, MD;  Location: [facility] ORS;  Service: Ophthalmology;  Laterality: Right;  [place] 01:58 AP% 23.5 CDE 50.91 fluid pack lot # [id number]  . INSERT / REPLACE / REMOVE PACEMAKER    . PACEMAKER PLACEMENT    . TONSILLECTOMY      Prior to Admission medications   Medication Sig Start Date End Date Taking? Authorizing Provider  albuterol (PROVENTIL) (2.5 MG/3ML) 0.083% nebulizer solution Take 3 mLs (2.5 mg total) by nebulization every 4 (four) hours as needed for wheezing or shortness of breath. 08/28/17   Jerrol Banana., MD  amLODipine (NORVASC) 5 MG tablet Take 1 tablet (5 mg total) by mouth  daily. 10/10/17   Fritzi Mandes, MD  aspirin 81 MG tablet Take 81 mg by mouth at bedtime.  08/22/11   [provider]  carvedilol (COREG) 12.5 MG tablet Take 12.5 mg by mouth 2 (two) times daily with a meal.  08/22/11   [provider]  furosemide (LASIX) 20 MG tablet Take 20 mg by mouth daily.  08/22/11   [provider]  OXYGEN Inhale  into the lungs.    [provider]  PARoxetine (PAXIL) 20 MG tablet TAKE 1 TABLET(20 MG) BY MOUTH AT BEDTIME 07/03/17   Jerrol Banana., MD  pravastatin (PRAVACHOL) 40 MG tablet TAKE 1 TABLET BY MOUTH AT BEDTIME 09/27/17   Jerrol Banana., MD    Allergies Patient has no known allergies.  Family History  Problem Relation Age of Onset  . Psoriasis Mother   . Heart attack Father     Social History Social History   Tobacco Use  . Smoking status: Former Smoker    Packs/day: 0.75    Years: 54.00    Pack years: 40.50    Types: Cigarettes    Last attempt to quit: 2014    Years since quitting: 5.4  . Smokeless tobacco: Never Used  . Tobacco comment: 2014?  Substance Use Topics  . Alcohol use: No  . Drug use: No    Review of Systems Unable to obtain secondary to acute medical condition  ____________________________________________   PHYSICAL EXAM:  VITAL SIGNS: ED Triage Vitals  Enc Vitals Group     BP 12/15/17 2045 (!) 188/97     Pulse Rate 12/15/17 2045 93     Resp --      Temp --      Temp src --      SpO2 12/15/17 2044 94 %     Weight --      Height --      Head Circumference --      Peak Flow --      Pain Score 12/15/17 2044 0   Constitutional: Awake and alert.  Severe respiratory distress. Eyes: Conjunctivae are normal.  ENT      Head: Normocephalic and atraumatic.      Nose: No congestion/rhinnorhea.      Mouth/Throat: Mucous membranes are moist.      Neck: No stridor. Hematological/Lymphatic/Immunilogical: No cervical lymphadenopathy. Cardiovascular: Normal rate, regular rhythm.  No murmurs, rubs, or gallops. Respiratory: Increased respiratory effort.  Significantly diminished breath sounds diffusely.  Tachypnea Gastrointestinal: Soft and non tender. No rebound. No guarding.  Genitourinary: Deferred Musculoskeletal: Normal range of motion in all extremities. No lower extremity edema. Neurologic:  Awake and alert. Will nod to basic  questions. No gross focal neurologic deficits are appreciated.  Skin:  Skin is warm, dry and intact. No rash noted.  ____________________________________________    LABS (pertinent positives/negatives)  Trop 0.03 BMP na 140, k 4.7, glu 222, cr 1.97 Lactic 0.6 CBC wbc 16.8, hgb 13.6, plt 191  ____________________________________________   EKG  I, Nance Pear, attending physician, personally viewed and interpreted this EKG  EKG Time: 2046 Rate: 93 Rhythm: sinus rhythm Axis: right axis deviation Intervals: qtc 483 QRS: q waves v1, v2, v3 ST changes: st elevation v3 Impression: abnormal ekg   ____________________________________________    RADIOLOGY  CXR Pulmonary edema  I, Andrey Mccaskill, personally viewed and evaluated these images (plain radiographs) as part of my medical decision making. ____________________________________________   PROCEDURES  Procedures  CRITICAL CARE Performed by:  Ralyn Stlaurent   Total critical care time: 35 minutes  Critical care time was exclusive of separately billable procedures and treating other patients.  Critical care was necessary to treat or prevent imminent or life-threatening deterioration.  Critical care was time spent personally by me on the following activities: development of treatment plan with patient and/or surrogate as well as nursing, discussions with consultants, evaluation of patient's response to treatment, examination of patient, obtaining history from patient or surrogate, ordering and performing treatments and interventions, ordering and review of laboratory studies, ordering and review of radiographic studies, pulse oximetry and re-evaluation of patient's condition.  ____________________________________________   INITIAL IMPRESSION / ASSESSMENT AND PLAN / ED COURSE  Pertinent labs & imaging results that were available during my care of the patient were reviewed by me and considered in my medical  decision making (see chart for details).   Is presented to the emergency department today as emergency traffic via EMS for respiratory difficulty.  On exam patient was in significant respiratory distress.  He switched over to our BiPAP from EMS CPAP.  He was unable able to give any significant history.  Lung sounds were grossly diminished.  Differential be broad including COPD or CHF exacerbation, pneumonia, PE, pleural effusions amongst other etiologies.  X-ray read by radiologist was pulmonary edema however question of pneumonia in the right lower lobe.  This would be consistent with leukocytosis found on blood work.  Patient will give be given broad-spectrum antibiotics.  Will also give small dose of Lasix if pneumonia is playing a role.  Patient will be admitted to the hospital service.  Discussed findings plan with patient and family.   ____________________________________________   FINAL CLINICAL IMPRESSION(S) / ED DIAGNOSES  Final diagnoses:  Acute respiratory failure, unspecified whether with hypoxia or hypercapnia (HCC)  Leukocytosis, unspecified type  Pneumonia due to infectious organism, unspecified laterality, unspecified part of lung     Note: This dictation was prepared with Dragon dictation. Any transcriptional errors that result from this process are unintentional     Nance Pear, MD 12/15/17 2217

## 2017-12-16 ENCOUNTER — Other Ambulatory Visit: Payer: Self-pay

## 2017-12-16 ENCOUNTER — Inpatient Hospital Stay: Admit: 2017-12-16 | Payer: Medicare Other

## 2017-12-16 DIAGNOSIS — N179 Acute kidney failure, unspecified: Secondary | ICD-10-CM

## 2017-12-16 DIAGNOSIS — J189 Pneumonia, unspecified organism: Secondary | ICD-10-CM

## 2017-12-16 DIAGNOSIS — J810 Acute pulmonary edema: Secondary | ICD-10-CM

## 2017-12-16 DIAGNOSIS — J9601 Acute respiratory failure with hypoxia: Secondary | ICD-10-CM

## 2017-12-16 DIAGNOSIS — N183 Chronic kidney disease, stage 3 (moderate): Secondary | ICD-10-CM

## 2017-12-16 DIAGNOSIS — A419 Sepsis, unspecified organism: Principal | ICD-10-CM

## 2017-12-16 LAB — TROPONIN I: Troponin I: 0.04 ng/mL (ref <0.03)

## 2017-12-16 LAB — BASIC METABOLIC PANEL
ANION GAP: 9 (ref 5–15)
BUN: 38 mg/dL — ABNORMAL HIGH (ref 8–23)
CO2: 27 mmol/L (ref 22–32)
Calcium: 8.6 mg/dL — ABNORMAL LOW (ref 8.9–10.3)
Chloride: 104 mmol/L (ref 98–111)
Creatinine, Ser: 2.05 mg/dL — ABNORMAL HIGH (ref 0.61–1.24)
GFR calc Af Amer: 35 mL/min — ABNORMAL LOW (ref >60)
GFR calc non Af Amer: 30 mL/min — ABNORMAL LOW (ref >60)
GLUCOSE: 146 mg/dL — AB (ref 70–99)
POTASSIUM: 4.4 mmol/L (ref 3.5–5.1)
Sodium: 140 mmol/L (ref 135–145)

## 2017-12-16 LAB — BRAIN NATRIURETIC PEPTIDE: B Natriuretic Peptide: 2528 pg/mL — ABNORMAL HIGH (ref 0.0–100.0)

## 2017-12-16 LAB — CBC
HEMATOCRIT: 38.4 % — AB (ref 40.0–52.0)
HEMOGLOBIN: 12.5 g/dL — AB (ref 13.0–18.0)
MCH: 29 pg (ref 26.0–34.0)
MCHC: 32.5 g/dL (ref 32.0–36.0)
MCV: 89.4 fL (ref 80.0–100.0)
Platelets: 149 10*3/uL — ABNORMAL LOW (ref 150–440)
RBC: 4.29 MIL/uL — ABNORMAL LOW (ref 4.40–5.90)
RDW: 13.8 % (ref 11.5–14.5)
WBC: 5.2 10*3/uL (ref 3.8–10.6)

## 2017-12-16 LAB — GLUCOSE, CAPILLARY
GLUCOSE-CAPILLARY: 145 mg/dL — AB (ref 70–99)
Glucose-Capillary: 120 mg/dL — ABNORMAL HIGH (ref 70–99)

## 2017-12-16 LAB — MRSA PCR SCREENING: MRSA by PCR: NEGATIVE

## 2017-12-16 MED ORDER — SODIUM CHLORIDE 0.9 % IV SOLN
1.0000 g | Freq: Two times a day (BID) | INTRAVENOUS | Status: DC
Start: 2017-12-16 — End: 2017-12-18
  Administered 2017-12-16 – 2017-12-18 (×4): 1 g via INTRAVENOUS
  Filled 2017-12-16 (×7): qty 1

## 2017-12-16 MED ORDER — FUROSEMIDE 10 MG/ML IJ SOLN
20.0000 mg | Freq: Two times a day (BID) | INTRAMUSCULAR | Status: DC
  Administered 2017-12-16 – 2017-12-17 (×4): 20 mg via INTRAVENOUS
  Filled 2017-12-16 (×4): qty 2

## 2017-12-16 MED ORDER — SODIUM CHLORIDE 0.9% FLUSH
3.0000 mL | Freq: Two times a day (BID) | INTRAVENOUS | Status: DC
  Administered 2017-12-16 – 2017-12-18 (×4): 3 mL via INTRAVENOUS

## 2017-12-16 MED ORDER — ONDANSETRON HCL 4 MG PO TABS: 4.0000 mg | ORAL_TABLET | Freq: Four times a day (QID) | ORAL | Status: DC | PRN

## 2017-12-16 MED ORDER — ACETAMINOPHEN 650 MG RE SUPP: 650.0000 mg | Freq: Four times a day (QID) | RECTAL | Status: DC | PRN

## 2017-12-16 MED ORDER — HYDROCODONE-ACETAMINOPHEN 5-325 MG PO TABS: 1.0000 | ORAL_TABLET | ORAL | Status: DC | PRN

## 2017-12-16 MED ORDER — SODIUM CHLORIDE 0.9% FLUSH: 3.0000 mL | INTRAVENOUS | Status: DC | PRN

## 2017-12-16 MED ORDER — PRAVASTATIN SODIUM 40 MG PO TABS
40.0000 mg | ORAL_TABLET | Freq: Every day | ORAL | Status: DC
  Administered 2017-12-16 – 2017-12-17 (×2): 40 mg via ORAL
  Filled 2017-12-16 (×2): qty 1

## 2017-12-16 MED ORDER — TRAZODONE HCL 50 MG PO TABS: 25.0000 mg | ORAL_TABLET | Freq: Every evening | ORAL | Status: DC | PRN

## 2017-12-16 MED ORDER — FUROSEMIDE 10 MG/ML IJ SOLN
20.0000 mg | Freq: Once | INTRAMUSCULAR | Status: AC
  Administered 2017-12-16: 20 mg via INTRAVENOUS
  Filled 2017-12-16: qty 2

## 2017-12-16 MED ORDER — HEPARIN SODIUM (PORCINE) 5000 UNIT/ML IJ SOLN
5000.0000 [IU] | Freq: Three times a day (TID) | INTRAMUSCULAR | Status: DC
  Administered 2017-12-16 – 2017-12-18 (×7): 5000 [IU] via SUBCUTANEOUS
  Filled 2017-12-16 (×7): qty 1

## 2017-12-16 MED ORDER — ASPIRIN EC 81 MG PO TBEC
81.0000 mg | DELAYED_RELEASE_TABLET | Freq: Every day | ORAL | Status: DC
Start: 2017-12-16 — End: 2017-12-18
  Administered 2017-12-16 – 2017-12-17 (×2): 81 mg via ORAL
  Filled 2017-12-16 (×2): qty 1

## 2017-12-16 MED ORDER — BISACODYL 5 MG PO TBEC: 5.0000 mg | DELAYED_RELEASE_TABLET | Freq: Every day | ORAL | Status: DC | PRN

## 2017-12-16 MED ORDER — AMLODIPINE BESYLATE 5 MG PO TABS
5.0000 mg | ORAL_TABLET | Freq: Every day | ORAL | Status: DC
  Administered 2017-12-16 – 2017-12-18 (×3): 5 mg via ORAL
  Filled 2017-12-16 (×3): qty 1

## 2017-12-16 MED ORDER — PAROXETINE HCL 20 MG PO TABS
20.0000 mg | ORAL_TABLET | Freq: Every day | ORAL | Status: DC
  Administered 2017-12-16 – 2017-12-18 (×3): 20 mg via ORAL
  Filled 2017-12-16 (×3): qty 1

## 2017-12-16 MED ORDER — DOCUSATE SODIUM 100 MG PO CAPS
100.0000 mg | ORAL_CAPSULE | Freq: Two times a day (BID) | ORAL | Status: DC
  Administered 2017-12-16 – 2017-12-18 (×4): 100 mg via ORAL
  Filled 2017-12-16 (×5): qty 1

## 2017-12-16 MED ORDER — ACETAMINOPHEN 325 MG PO TABS: 650.0000 mg | ORAL_TABLET | Freq: Four times a day (QID) | ORAL | Status: DC | PRN

## 2017-12-16 MED ORDER — SODIUM CHLORIDE 0.9 % IV SOLN
Freq: Once | INTRAVENOUS | Status: AC
  Administered 2017-12-16: 75 mL/h via INTRAVENOUS

## 2017-12-16 MED ORDER — ONDANSETRON HCL 4 MG/2ML IJ SOLN: 4.0000 mg | Freq: Four times a day (QID) | INTRAMUSCULAR | Status: DC | PRN

## 2017-12-16 MED ORDER — IPRATROPIUM-ALBUTEROL 0.5-2.5 (3) MG/3ML IN SOLN
3.0000 mL | Freq: Four times a day (QID) | RESPIRATORY_TRACT | Status: DC
  Administered 2017-12-16 – 2017-12-18 (×9): 3 mL via RESPIRATORY_TRACT
  Filled 2017-12-16 (×10): qty 3

## 2017-12-16 MED ORDER — CARVEDILOL 12.5 MG PO TABS
12.5000 mg | ORAL_TABLET | Freq: Two times a day (BID) | ORAL | Status: DC
  Administered 2017-12-16 – 2017-12-18 (×5): 12.5 mg via ORAL
  Filled 2017-12-16 (×5): qty 1

## 2017-12-16 NOTE — Progress Notes (Signed)
Report called to Danae Chen, RN on 2A. Patient transported via wheelchair to room 260 on oxygen at 2 lpm by Beatrice, NT.

## 2017-12-16 NOTE — Progress Notes (Signed)
Pharmacy Antibiotic Note  Phillip Allison. is a 77 y.o. male admitted on 12/15/2017 with pneumonia/sepsis.  Pharmacy has been consulted for cefepime dosing.  Plan: Continue cefepime 1g IV Q12hr.   Vancomycin discontinued on ICU rounds due to negative MRSA PCR.   Height: 5\' 11"  (180.3 cm) Weight: 178 lb 9.2 oz (81 kg) IBW/kg (Calculated) : 75.3  Temp (24hrs), Avg:97.8 F (36.6 C), Min:97.5 F (36.4 C), Max:98.1 F (36.7 C)  Recent Labs  Lab 12/15/17 2047 12/16/17 0558  WBC 16.8* 5.2  CREATININE 1.97* 2.05*  LATICACIDVEN 0.6  --     Estimated Creatinine Clearance: 33.2 mL/min (A) (by C-G formula based on SCr of 2.05 mg/dL (H)).    No Known Allergies  Antimicrobials this admission: Cefepime 6/24 >>  Vancomycin 6/24 >> 6/25  Dose adjustments this admission: 6/25 Cefepime transitioned to 1g IV Q12hr.   Microbiology results: 6/25 MRSA PCR: negative   Thank you for allowing pharmacy to be a part of this patient's care.  Simpson,Michael L 12/16/2017 5:20 PM

## 2017-12-16 NOTE — Care Management (Signed)
Patient was closed out from home health services through [facility] on December 02, 2017.

## 2017-12-16 NOTE — Progress Notes (Signed)
ANTIBIOTIC CONSULT NOTE - INITIAL  Pharmacy Consult for Vancomycin , Cefepime  Indication: sepsis  No Known Allergies  Patient Measurements: Weight: 173 lb (78.5 kg) Adjusted Body Weight: 76.6 kg   Vital Signs: Temp: 98.1 F (36.7 C) (06/24 2059) Temp Source: Rectal (06/24 2059) BP: 110/60 (06/24 2315) Pulse Rate: 56 (06/24 2315) Intake/Output from previous day: 06/24 0701 - 06/25 0700 In: 436.7 [IV Piggyback:436.7] Out: -  Intake/Output from this shift: Total I/O In: 436.7 [IV Piggyback:436.7] Out: -   Labs: Recent Labs    12/15/17 2047  WBC 16.8*  HGB 13.6  PLT 191  CREATININE 1.97*   Estimated Creatinine Clearance: 34.5 mL/min (A) (by C-G formula based on SCr of 1.97 mg/dL (H)). No results for input(s): VANCOTROUGH, VANCOPEAK, VANCORANDOM, GENTTROUGH, GENTPEAK, GENTRANDOM, TOBRATROUGH, TOBRAPEAK, TOBRARND, AMIKACINPEAK, AMIKACINTROU, AMIKACIN in the last 72 hours.   Microbiology: No results found for this or any previous visit (from the past 720 hour(s)).  Medical History: Past Medical History:  Diagnosis Date  . CHF (congestive heart failure) ([place])   . COPD (chronic obstructive pulmonary disease) ([place])   . Depression   . Emphysema lung ([place])   . Hypercholesterolemia   . Hypertension   . Myocardial infarction ([place])   . On home oxygen therapy    2 liters at night and as needed  . Pneumonia    in the past  . Presence of permanent cardiac pacemaker   . Shortness of breath dyspnea    with exertion    Medications:   (Not in a hospital admission) Assessment: CrCl = 34.5 ml/min Ke = 0.033 hr-1 T1/2 = 21 hrs Vd = 55 L   Goal of Therapy:  Vancomycin trough level 15-20 mcg/ml  Plan:  Expected duration 7 days with resolution of temperature and/or normalization of WBC   Cefepime 2 gm IV X 1 given in ED on 6/24 @ 21:18. Cefepime 2 gm IV Q12H ordered to start on 6/25 @ 0900.  Vancomycin 1 gm IV X 1 given in ED on 6/24 @ 21:18. Vancomycin 1 gm IV  Q24H ordered to start on 6/25 @ 0300, ~ 6 hrs after 1st dose (stacked dosing). This pt will reach [facility] by 6/29 @ ~ 0900. Will draw 1st trough on 6/28 @ 0230 which will not be at [facility].   Calleen Alvis D 12/16/2017,12:30 AM

## 2017-12-16 NOTE — Consult Note (Addendum)
[facility] Consultation      Name: Phillip Allison. MRN: [id number] DOB: [birth year]    ADMISSION DATE:  12/15/2017 CONSULTATION DATE:  12/16/2017  REFERRING MD :  Amelia Jo, MD   CHIEF COMPLAINT:     Respiratory distyress   HISTORY OF PRESENT ILLNESS    Sumit Branham  is a 77 y.o. male with a known history of CHF, COPD, hypertension, hyperlipidemia, coronary artery disease.  He is on continuous BiPAP at home, although, per family, he could take up to 2 hours breaks off BiPAP, during the day. Patient is lethargic and in severe respiratory distress, on BiPAP, unable to provide history.  Most of the information was taken from reviewing the medical chart and from discussion with emergency room physician and with the family. He presented to emergency room via EMS for acute onset of shortness of breath going on for the past days1-2, progressively getting worse, despite using nebulizer treatments at home and continues BiPAP.  Per family, patient has been complaining of not feeling well and some GI discomfort, due to constipation going on for the past 2 days.  She was finally able to have a large stool earlier today, but continued to be short of breath and his oxygen saturation measured at home, dropped to 50s, at times. Blood test done emergency room are remarkable for elevated WBC at 16.8.  Creatinine level is 1.97.  BNP is elevated at 2600.  Troponin level is 0.03.  EKG shows normal sinus rhythm with heart rate at 93 bpm.  Q waves in V1 V2 and V3 are noted, as stable, old findings when compared to prior EKG. Chest x-ray showed of right lower lobe infiltrate and pulmonary congestion.  Patient arrived in the ICU on BIPAP    SIGNIFICANT EVENTS   6/25 admitted to ICU    PAST MEDICAL HISTORY    :  Past Medical History:  Diagnosis Date  . CHF (congestive heart failure) ([place])   . COPD (chronic obstructive pulmonary disease) ([place])   . Depression     . Emphysema lung ([place])   . Hypercholesterolemia   . Hypertension   . Myocardial infarction ([place])   . On home oxygen therapy    2 liters at night and as needed  . Pneumonia    in the past  . Presence of permanent cardiac pacemaker   . Shortness of breath dyspnea    with exertion   Past Surgical History:  Procedure Laterality Date  . ABDOMINAL AORTIC ANEURYSM REPAIR    . ANGIOPLASTY     left lower extremity  . CATARACT EXTRACTION W/PHACO Right 12/18/2015   Procedure: CATARACT EXTRACTION PHACO AND INTRAOCULAR LENS PLACEMENT (IOC);  Surgeon: Estill Cotta, MD;  Location: [facility] ORS;  Service: Ophthalmology;  Laterality: Right;  [place] 01:58 AP% 23.5 CDE 50.91 fluid pack lot # [id number]  . INSERT / REPLACE / REMOVE PACEMAKER    . PACEMAKER PLACEMENT    . TONSILLECTOMY     Prior to Admission medications   Medication Sig Start Date End Date Taking? Authorizing Provider  albuterol (PROVENTIL) (2.5 MG/3ML) 0.083% nebulizer solution Take 3 mLs (2.5 mg total) by nebulization every 4 (four) hours as needed for wheezing or shortness of breath. 08/28/17   Jerrol Banana., MD  amLODipine (NORVASC) 5 MG tablet Take 1 tablet (5 mg total) by mouth daily. 10/10/17   Fritzi Mandes, MD  aspirin 81 MG tablet Take 81 mg by mouth at bedtime.  08/22/11  [provider]  carvedilol (COREG) 12.5 MG tablet Take 12.5 mg by mouth 2 (two) times daily with a meal.  08/22/11   [provider]  furosemide (LASIX) 20 MG tablet Take 20 mg by mouth daily.  08/22/11   [provider]  OXYGEN Inhale into the lungs.    [provider]  PARoxetine (PAXIL) 20 MG tablet TAKE 1 TABLET(20 MG) BY MOUTH AT BEDTIME 07/03/17   Jerrol Banana., MD  pravastatin (PRAVACHOL) 40 MG tablet TAKE 1 TABLET BY MOUTH AT BEDTIME 09/27/17   Jerrol Banana., MD   No Known Allergies   FAMILY HISTORY   Family History  Problem Relation Age of Onset  . Psoriasis Mother   . Heart attack Father        SOCIAL HISTORY    reports that he quit smoking about 5 years ago. His smoking use included cigarettes. He has a 40.50 pack-year smoking history. He has never used smokeless tobacco. He reports that he does not drink alcohol or use drugs.  ROS Unattainable as patient on BIPAP   VITAL SIGNS    Temp:  [97.5 F (36.4 C)-98.1 F (36.7 C)] 97.5 F (36.4 C) (06/25 0200) Pulse Rate:  [50-95] 58 (06/25 0100) Resp:  [11-27] 21 (06/25 0100) BP: (101-188)/(57-97) 132/71 (06/25 0100) SpO2:  [81 %-100 %] 100 % (06/25 0100) FiO2 (%):  [40 %] 40 % (06/25 0246) Weight:  [173 lb (78.5 kg)-178 lb 9.2 oz (81 kg)] 178 lb 9.2 oz (81 kg) (06/25 0152) HEMODYNAMICS:  no compromise  VENTILATOR SETTINGS: FiO2 (%):  [40 %] 40 % INTAKE / OUTPUT:  Intake/Output Summary (Last 24 hours) at 12/16/2017 0509 Last data filed at 12/16/2017 0220 Gross per 24 hour  Intake 436.66 ml  Output 275 ml  Net 161.66 ml       PHYSICAL EXAM    GENERAL:  lying in the bed with no acute distress on BIPAP.  EYES: Pupils equal, round, reactive to light and accommodation. No scleral icterus. Extraocular muscles intact.  HEENT: Head atraumatic, normocephalic. On BIPAP NECK:  Supple, no jugular venous distention. No thyroid enlargement, no tenderness.  LUNGS: Tachypnea.  Reduced breath sounds bilaterally, no wheezing, few bibasilar crackles CARDIOVASCULAR: S1, S2 normal. No S3/S4. RRR ABDOMEN: Soft, nontender, nondistended. Bowel sounds present. No organomegaly or mass.  EXTREMITIES: No pedal edema, cyanosis, or clubbing.  NEUROLOGIC: Patient is moving all his extremities, no obvious focal weakness appreciated. PSYCHIATRIC: Lethargic  SKIN: No obvious rash, lesion, or ulcer.       LABS   LABS:  CBC Recent Labs  Lab 12/15/17 2047  WBC 16.8*  HGB 13.6  HCT 41.8  PLT 191   Coag's No results for input(s): APTT, INR in the last 168 hours. BMET Recent Labs  Lab 12/15/17 2047  NA 140  K 4.7   CL 104  CO2 29  BUN 32*  CREATININE 1.97*  GLUCOSE 222*   Electrolytes Recent Labs  Lab 12/15/17 2047  CALCIUM 8.6*   Sepsis Markers Recent Labs  Lab 12/15/17 2047  LATICACIDVEN 0.6   ABG No results for input(s): PHART, PCO2ART, PO2ART in the last 168 hours. Liver Enzymes No results for input(s): AST, ALT, ALKPHOS, BILITOT, ALBUMIN in the last 168 hours. Cardiac Enzymes Recent Labs  Lab 12/15/17 2047  TROPONINI 0.03*   Glucose Recent Labs  Lab 12/16/17 0142  GLUCAP 120*     Recent Results (from the past 240 hour(s))  MRSA PCR Screening  Status: None   Collection Time: 12/16/17  1:51 AM  Result Value Ref Range Status   MRSA by PCR NEGATIVE NEGATIVE Final    Comment:        The GeneXpert MRSA Assay (FDA approved for NASAL specimens only), is one component of a comprehensive MRSA colonization surveillance program. It is not intended to diagnose MRSA infection nor to guide or monitor treatment for MRSA infections. Performed at [facility], [address] [place], [place] [address]      Current Facility-Administered Medications:  .  acetaminophen (TYLENOL) tablet 650 mg, 650 mg, Oral, Q6H PRN **OR** acetaminophen (TYLENOL) suppository 650 mg, 650 mg, Rectal, Q6H PRN, Amelia Jo, MD .  amLODipine (NORVASC) tablet 5 mg, 5 mg, Oral, Daily, Amelia Jo, MD .  aspirin EC tablet 81 mg, 81 mg, Oral, QHS, Amelia Jo, MD .  bisacodyl (DULCOLAX) EC tablet 5 mg, 5 mg, Oral, Daily PRN, Amelia Jo, MD .  carvedilol (COREG) tablet 12.5 mg, 12.5 mg, Oral, BID WC, Amelia Jo, MD .  ceFEPIme (MAXIPIME) 2 g in sodium chloride 0.9 % 100 mL IVPB, 2 g, Intravenous, Q12H, Amelia Jo, MD .  docusate sodium (COLACE) capsule 100 mg, 100 mg, Oral, BID, Amelia Jo, MD .  furosemide (LASIX) injection 20 mg, 20 mg, Intravenous, Q12H, Amelia Jo, MD .  heparin injection 5,000 Units, 5,000 Units, Subcutaneous, Q8H, Amelia Jo, MD, 5,000 Units at  12/16/17 [phone] .  HYDROcodone-acetaminophen (NORCO/VICODIN) 5-325 MG per tablet 1-2 tablet, 1-2 tablet, Oral, Q4H PRN, Amelia Jo, MD .  ipratropium-albuterol (DUONEB) 0.5-2.5 (3) MG/3ML nebulizer solution 3 mL, 3 mL, Nebulization, Q6H, Amelia Jo, MD, 3 mL at 12/16/17 0246 .  ondansetron (ZOFRAN) tablet 4 mg, 4 mg, Oral, Q6H PRN **OR** ondansetron (ZOFRAN) injection 4 mg, 4 mg, Intravenous, Q6H PRN, Amelia Jo, MD .  PARoxetine (PAXIL) tablet 20 mg, 20 mg, Oral, Daily, Amelia Jo, MD .  pravastatin (PRAVACHOL) tablet 40 mg, 40 mg, Oral, QHS, Amelia Jo, MD .  traZODone (DESYREL) tablet 25 mg, 25 mg, Oral, QHS PRN, Amelia Jo, MD .  vancomycin (VANCOCIN) IVPB 1000 mg/200 mL premix, 1,000 mg, Intravenous, Q24H, Amelia Jo, MD, Stopped at 12/16/17 0320  IMAGING    Dg Chest Portable 1 View  Result Date: 12/15/2017 CLINICAL DATA:  Acute onset respiratory distress. EXAM: PORTABLE CHEST 1 VIEW COMPARISON:  10/28/2017 FINDINGS: Stable cardiomegaly with moderate atherosclerosis of the tortuous thoracic aorta. Left-sided ICD device noted with leads in the right atrium and right ventricle. Interval increase in interstitial edema since prior. Trace right effusion blunting the right lateral costophrenic angle is noted. No pneumothorax or pulmonary consolidation. No acute osseous abnormality. IMPRESSION: There has been interval development of interstitial pulmonary edema with stable cardiomegaly and atherosclerosis of the nonaneurysmal aorta. Trace right effusion. Electronically Signed   By: Ashley Royalty M.D.   On: 12/15/2017 21:11     MAJOR EVENTS/TEST RESULTS:  6/24 admitted  INDWELLING DEVICES:: Peripheral IVs  MICRO DATA: MRSA PCR Negative   ANTIMICROBIALS:  6/25 Cefepime, Vancomycin   ASSESSMENT/PLAN   1.  Acute on chronic respiratory failure with hypoxia, secondary to pneumonia and acute pulmonary edema. Plan: continue on BIPAP.  It is reported that patient is BIPAP  dependent at home. Lung recruitment with Nebs.    2.  Sepsis, secondary to community acquired pneumonia.  Plan: Continue  broad-spectrum IV antibiotics and oxygen therapy,. D/C Vancomycin since MRSA is negative.  3.  Acute pulmonary edema.   Plan: Agree with  diuresis with Lasix IV. Will check 2Decho. Trend BNP  4.  RLL CAP Plan: will start on broad-spectrum IV antibiotics, oxygen therapy and nebulizer treatments.  5.  CKD 3, creatinine is at baseline, 1.97.   Plan:  Continue to monitor kidney function closely and avoid nephrotoxic medications.  6.  Hx of  Hypertension, stable Plan: continue   home medications.  7.  Hyperlipidemia, on statin.  Misc: DVT prophylaxis    I have personally obtained a history, examined the patient, evaluated laboratory and independently reviewed  imaging results, formulated the assessment and plan and placed orders.  The Patient requires high complexity decision making for assessment and support, frequent evaluation and titration of therapies, application of advanced monitoring technologies and extensive interpretation of multiple databases. Critical Care Time devoted to patient care services described in this note is 35 minutes.   Overall, patient is critically ill, prognosis is guarded. Patient at high risk for cardiac arrest and death.    Patient re-evaluated this afternoon.  OFF BIPAP and doing well on 2 liters Oxygen. Plan: will transfer to telemetry.  Son updated. Cammie Sickle, M.D

## 2017-12-16 NOTE — Progress Notes (Signed)
[place] [facility] [place] NAME: Phillip Allison    MR#:  [id number]  DATE OF BIRTH:  10-04-1940  SUBJECTIVE:  CHIEF COMPLAINT:   Chief Complaint  Patient presents with  . Respiratory Distress  Off BiPAP, feels some better REVIEW OF SYSTEMS:  Review of Systems  Constitutional: Negative for diaphoresis, fever, malaise/fatigue and weight loss.  HENT: Negative for ear discharge, ear pain, hearing loss, nosebleeds, sore throat and tinnitus.   Eyes: Negative for blurred vision and pain.  Respiratory: Positive for shortness of breath. Negative for cough, hemoptysis and wheezing.   Cardiovascular: Negative for chest pain, palpitations, orthopnea and leg swelling.  Gastrointestinal: Negative for abdominal pain, blood in stool, constipation, diarrhea, heartburn, nausea and vomiting.  Genitourinary: Negative for dysuria, frequency and urgency.  Musculoskeletal: Negative for back pain and myalgias.  Skin: Negative for itching and rash.  Neurological: Negative for dizziness, tingling, tremors, focal weakness, seizures, weakness and headaches.  Psychiatric/Behavioral: Negative for depression. The patient is not nervous/anxious.     DRUG ALLERGIES:  No Known Allergies VITALS:  Blood pressure (!) 149/62, pulse 81, temperature 98.3 F (36.8 C), temperature source Oral, resp. rate 19, height 5\' 11"  (1.803 m), weight 81 kg (178 lb 9.2 oz), SpO2 95 %. PHYSICAL EXAMINATION:  Physical Exam  Constitutional: He is oriented to person, place, and time.  HENT:  Head: Normocephalic and atraumatic.  Eyes: Pupils are equal, round, and reactive to light. Conjunctivae and EOM are normal.  Neck: Normal range of motion. Neck supple. No tracheal deviation present. No thyromegaly present.  Cardiovascular: Normal rate, regular rhythm and normal heart sounds.  Pulmonary/Chest: Effort normal and breath sounds normal. No respiratory distress. He has no wheezes. He  exhibits no tenderness.  Abdominal: Soft. Bowel sounds are normal. He exhibits no distension. There is no tenderness.  Musculoskeletal: Normal range of motion.  Neurological: He is alert and oriented to person, place, and time. No cranial nerve deficit.  Skin: Skin is warm and dry. No rash noted.   LABORATORY PANEL:  Male CBC Recent Labs  Lab 12/16/17 0558  WBC 5.2  HGB 12.5*  HCT 38.4*  PLT 149*   ------------------------------------------------------------------------------------------------------------------ Chemistries  Recent Labs  Lab 12/16/17 0558  NA 140  K 4.4  CL 104  CO2 27  GLUCOSE 146*  BUN 38*  CREATININE 2.05*  CALCIUM 8.6*   RADIOLOGY:  Dg Chest Portable 1 View  Result Date: 12/15/2017 CLINICAL DATA:  Acute onset respiratory distress. EXAM: PORTABLE CHEST 1 VIEW COMPARISON:  10/28/2017 FINDINGS: Stable cardiomegaly with moderate atherosclerosis of the tortuous thoracic aorta. Left-sided ICD device noted with leads in the right atrium and right ventricle. Interval increase in interstitial edema since prior. Trace right effusion blunting the right lateral costophrenic angle is noted. No pneumothorax or pulmonary consolidation. No acute osseous abnormality. IMPRESSION: There has been interval development of interstitial pulmonary edema with stable cardiomegaly and atherosclerosis of the nonaneurysmal aorta. Trace right effusion. Electronically Signed   By: Ashley Royalty M.D.   On: 12/15/2017 21:11   ASSESSMENT AND PLAN:   1.  Acute on chronic respiratory failure with hypoxia, secondary to pneumonia and acute pulmonary edema.  off BiPAP now, continue DuoNebs, oxygen therapy.  continue IV cefepime 2.  Sepsis: present on admission secondary to pneumonia.  continue IV cefepime and oxygen therapy. 3.  Acute pulmonary edema. Likely acute on chronic systolic CHF: last reported EF 20%, will recheck echo, c/s cardio 4.  RLL CAP:  continue IV antibiotics, oxygen therapy and  nebulizer treatments. 5.  CKD 3, creatinine is at baseline, 1.97. continue to monitor kidney function closely and avoid nephrotoxic medications. 6.  Borderline elevated troponin level is 0.03, likely related to demand ischemia, from acute respiratory failure. continue asa 7.  Hypertension, stable, to new home medications. 8.  Hyperlipidemia, on statin.       All the records are reviewed and case discussed with Care Management/Social Worker. Management plans discussed with the patient, nursing and they are in agreement.  CODE STATUS: DNR  TOTAL TIME TAKING CARE OF THIS PATIENT: 35 minutes.   More than 50% of the time was spent in counseling/coordination of care: YES  POSSIBLE D/C IN 1-2 DAYS, DEPENDING ON CLINICAL CONDITION.   [name] M.D on 12/16/2017 at 6:37 PM  Between 7am to 6pm - Pager - [phone]  After 6pm go to [email or web address] - password [email or web address] [facility]  [employer] [facility]  Office  [phone]  CC: Primary care physician; Jerrol Banana., MD  Note: This dictation was prepared with Dragon dictation along with smaller phrase technology. Any transcriptional errors that result from this process are unintentional.

## 2017-12-16 NOTE — Progress Notes (Signed)
Pt taken off bipap, tolerating well at this time. Placed on 2lpm [place]. RN notified, will continue to monitor.

## 2017-12-17 LAB — CBC WITH DIFFERENTIAL/PLATELET
Basophils Absolute: 0 10*3/uL (ref 0–0.1)
Basophils Relative: 0 %
EOS ABS: 0 10*3/uL (ref 0–0.7)
EOS PCT: 0 %
HCT: 34.8 % — ABNORMAL LOW (ref 40.0–52.0)
Hemoglobin: 11.7 g/dL — ABNORMAL LOW (ref 13.0–18.0)
LYMPHS ABS: 1.3 10*3/uL (ref 1.0–3.6)
Lymphocytes Relative: 11 %
MCH: 29.8 pg (ref 26.0–34.0)
MCHC: 33.5 g/dL (ref 32.0–36.0)
MCV: 88.9 fL (ref 80.0–100.0)
MONO ABS: 0.7 10*3/uL (ref 0.2–1.0)
Monocytes Relative: 6 %
Neutro Abs: 9.5 10*3/uL — ABNORMAL HIGH (ref 1.4–6.5)
Neutrophils Relative %: 83 %
Platelets: 128 10*3/uL — ABNORMAL LOW (ref 150–440)
RBC: 3.92 MIL/uL — ABNORMAL LOW (ref 4.40–5.90)
RDW: 13.8 % (ref 11.5–14.5)
WBC: 11.5 10*3/uL — ABNORMAL HIGH (ref 3.8–10.6)

## 2017-12-17 LAB — RENAL FUNCTION PANEL
ANION GAP: 10 (ref 5–15)
Albumin: 3.3 g/dL — ABNORMAL LOW (ref 3.5–5.0)
BUN: 62 mg/dL — AB (ref 8–23)
CALCIUM: 8.5 mg/dL — AB (ref 8.9–10.3)
CO2: 27 mmol/L (ref 22–32)
Chloride: 102 mmol/L (ref 98–111)
Creatinine, Ser: 2.58 mg/dL — ABNORMAL HIGH (ref 0.61–1.24)
GFR calc Af Amer: 26 mL/min — ABNORMAL LOW (ref >60)
GFR calc non Af Amer: 23 mL/min — ABNORMAL LOW (ref >60)
GLUCOSE: 120 mg/dL — AB (ref 70–99)
Phosphorus: 3.9 mg/dL (ref 2.5–4.6)
Potassium: 4 mmol/L (ref 3.5–5.1)
SODIUM: 139 mmol/L (ref 135–145)

## 2017-12-17 LAB — BASIC METABOLIC PANEL
Anion gap: 9 (ref 5–15)
BUN: 61 mg/dL — ABNORMAL HIGH (ref 8–23)
CALCIUM: 8.6 mg/dL — AB (ref 8.9–10.3)
CHLORIDE: 103 mmol/L (ref 98–111)
CO2: 27 mmol/L (ref 22–32)
CREATININE: 2.51 mg/dL — AB (ref 0.61–1.24)
GFR, EST AFRICAN AMERICAN: 27 mL/min — AB (ref >60)
GFR, EST NON AFRICAN AMERICAN: 24 mL/min — AB (ref >60)
GLUCOSE: 124 mg/dL — AB (ref 70–99)
Potassium: 4 mmol/L (ref 3.5–5.1)
Sodium: 139 mmol/L (ref 135–145)

## 2017-12-17 LAB — MAGNESIUM: Magnesium: 2.7 mg/dL — ABNORMAL HIGH (ref 1.7–2.4)

## 2017-12-17 LAB — GLUCOSE, CAPILLARY: GLUCOSE-CAPILLARY: 115 mg/dL — AB (ref 70–99)

## 2017-12-17 NOTE — Evaluation (Signed)
Physical Therapy Evaluation Patient Details Name: Phillip Allison. MRN: [id number] DOB: Aug 24, 1940 Today's Date: 12/17/2017   History of Present Illness  Pt is a 77 y/o M who presented lethargic with acute onset of SOB and progressively getting worse.  PT on BiPAP in the ED.  EKG normal sinus rhythm.  Chest x-ray suggestive of R lower lobe infiltrate and pulmonary congestion.  Pt with sepsis due to pneumonia.  Pt's PMH includes COPD, CHF, MI, 2L O2 at baseline.     Clinical Impression  Pt admitted with above diagnosis. Pt currently with functional limitations due to the deficits listed below (see PT Problem List). Pt is independent at baseline ambulating without AD and denies any falls in the past 6 months.  The pt reports he feels more weak compared to baseline and MMT reveals mild BLE weakness.  Pt fatigues after ambulating 160 ft and demonstrates instability with higher level balance activities while ambulating.  Pt demonstrates instability with rhomberg stance, tandem stance, and increased sway with eyes closed in normal stance.  SpO2 remains at or above 90% on 2L O2 while ambulating.  Pt will benefit from skilled PT to increase their independence and safety with mobility to allow discharge to the venue listed below.      Follow Up Recommendations Home health PT;Supervision/Assistance - 24 hour    Equipment Recommendations  None recommended by PT    Recommendations for Other Services       Precautions / Restrictions Precautions Precautions: Fall;Other (comment) Precaution Comments: O2 Restrictions Weight Bearing Restrictions: No      Mobility  Bed Mobility Overal bed mobility: Independent             General bed mobility comments: No physical assist or cues needed.  Pt performs independently.   Transfers Overall transfer level: Needs assistance Equipment used: None Transfers: Sit to/from Stand Sit to Stand: Min guard         General transfer comment: Mild  instability but no LOB.  Pt performs with safe technique.   Ambulation/Gait Ambulation/Gait assistance: Min guard Gait Distance (Feet): 160 Feet Assistive device: None Gait Pattern/deviations: Step-through pattern;Decreased step length - right;Decreased step length - left;Staggering left;Staggering right     General Gait Details: Pt steady ambulating without challenges to his balance.  With higher level balance activities the pt demonstrates instability but no LOB.  SpO2 remains at or above 90% on 2L O2 while ambulating.   Stairs            Wheelchair Mobility    Modified Rankin (Stroke Patients Only)       Balance Overall balance assessment: Needs assistance Sitting-balance support: No upper extremity supported;Feet supported Sitting balance-Leahy Scale: Good     Standing balance support: No upper extremity supported;During functional activity Standing balance-Leahy Scale: Fair Standing balance comment: Pt would likely lose his balance with perturbation                 Standardized Balance Assessment Standardized Balance Assessment : Dynamic Gait Index   Dynamic Gait Index Level Surface: Normal Change in Gait Speed: Normal Gait with Horizontal Head Turns: Mild Impairment Gait with Vertical Head Turns: Mild Impairment Gait and Pivot Turn: Moderate Impairment Step Over Obstacle: Mild Impairment Step Around Obstacles: Mild Impairment       Pertinent Vitals/Pain Pain Assessment: No/denies pain    Home Living Family/patient expects to be discharged to:: Private residence Living Arrangements: Children(son) Available Help at Discharge: Family;Available 24 hours/day Type of Home:  House Home Access: Level entry     Home Layout: (pt stays in the rennovated basement) Home Equipment: Cane - single point      Prior Function Level of Independence: Independent         Comments: Pt ambulates without AD and denies any falls in the past 6 months.   Independent with ADLs.  Family does the driving.  Pt stays in rennovated basement of son's home and does not ever ascend/descend the steps as he says he is unable to.       Hand Dominance        Extremity/Trunk Assessment   Upper Extremity Assessment Upper Extremity Assessment: (BUE strength grossly 4+/5)    Lower Extremity Assessment Lower Extremity Assessment: (BLE strength grossly 4/5)       Communication   Communication: No difficulties  Cognition Arousal/Alertness: Awake/alert Behavior During Therapy: WFL for tasks assessed/performed Overall Cognitive Status: Within Functional Limits for tasks assessed                                        General Comments General comments (skin integrity, edema, etc.): Son present during evaluation.  Encouraged pt to begin using [facility] for safety with ambulation until HHPT advises otherwise, pt verbalized understanding.     Exercises Other Exercises Other Exercises: Encouraged pt to ambulate at least 3x/day with the nursing staff during hospital stay Other Exercises: Balance tested: Pt loses balance in attempt to stand in rhomberg stance.  Loses balance in tandem stance after ~2 seconds Bil.  Increased sway but no LOB with eyes closed in normal stance.    Assessment/Plan    PT Assessment Patient needs continued PT services  PT Problem List Decreased strength;Decreased balance;Cardiopulmonary status limiting activity;Decreased safety awareness;Decreased knowledge of use of DME       PT Treatment Interventions DME instruction;Gait training;Functional mobility training;Therapeutic activities;Therapeutic exercise;Balance training;Neuromuscular re-education;Patient/family education    PT Goals (Current goals can be found in the Care Plan section)  Acute Rehab PT Goals Patient Stated Goal: to return home and improve his balance to return to PLOF PT Goal Formulation: With patient Time For Goal Achievement:  12/31/17 Potential to Achieve Goals: Good    Frequency Min 2X/week   Barriers to discharge        Co-evaluation               AM-PAC PT "6 Clicks" Daily Activity  Outcome Measure Difficulty turning over in bed (including adjusting bedclothes, sheets and blankets)?: None Difficulty moving from lying on back to sitting on the side of the bed? : None Difficulty sitting down on and standing up from a chair with arms (e.g., wheelchair, bedside commode, etc,.)?: A Little Help needed moving to and from a bed to chair (including a wheelchair)?: A Little Help needed walking in hospital room?: A Little Help needed climbing 3-5 steps with a railing? : A Lot 6 Click Score: 19    End of Session Equipment Utilized During Treatment: Gait belt;Oxygen Activity Tolerance: Patient tolerated treatment well;Patient limited by fatigue Patient left: in chair;with call bell/phone within reach;with chair alarm set;with family/visitor present Nurse Communication: Mobility status;Other (comment)(SpO2) PT Visit Diagnosis: Muscle weakness (generalized) (M62.81);Unsteadiness on feet (R26.81)    Time: 1326-1350 PT Time Calculation (min) (ACUTE ONLY): 24 min   Charges:   PT Evaluation $PT Eval Low Complexity: 1 Low PT Treatments $Gait Training: 8-22 mins  PT G Codes:        Collie Siad PT, DPT 12/17/2017, 2:09 PM

## 2017-12-17 NOTE — Progress Notes (Signed)
cpap plugged into the red outlet

## 2017-12-17 NOTE — Progress Notes (Signed)
[place] at [place] NAME: Phillip Allison    MR#:  [id number]  DATE OF BIRTH:  1941-03-28  SUBJECTIVE:  CHIEF COMPLAINT:   Chief Complaint  Patient presents with  . Respiratory Distress   better shortness of breath, oxygen 2 L. REVIEW OF SYSTEMS:  Review of Systems  Constitutional: Negative for diaphoresis, fever, malaise/fatigue and weight loss.  HENT: Negative for ear discharge, ear pain, hearing loss, nosebleeds, sore throat and tinnitus.   Eyes: Negative for blurred vision and pain.  Respiratory: Positive for shortness of breath. Negative for cough, hemoptysis and wheezing.   Cardiovascular: Negative for chest pain, palpitations, orthopnea and leg swelling.  Gastrointestinal: Negative for abdominal pain, blood in stool, constipation, diarrhea, heartburn, nausea and vomiting.  Genitourinary: Negative for dysuria, frequency and urgency.  Musculoskeletal: Negative for back pain and myalgias.  Skin: Negative for itching and rash.  Neurological: Negative for dizziness, tingling, tremors, focal weakness, seizures, weakness and headaches.  Psychiatric/Behavioral: Negative for depression. The patient is not nervous/anxious.     DRUG ALLERGIES:  No Known Allergies VITALS:  Blood pressure (!) 161/58, pulse 87, temperature 97.8 F (36.6 C), temperature source Oral, resp. rate 20, height 5\' 11"  (1.803 m), weight 177 lb 3.2 oz (80.4 kg), SpO2 95 %. PHYSICAL EXAMINATION:  Physical Exam  Constitutional: He is oriented to person, place, and time.  HENT:  Head: Normocephalic and atraumatic.  Eyes: Pupils are equal, round, and reactive to light. Conjunctivae and EOM are normal.  Neck: Normal range of motion. Neck supple. No tracheal deviation present. No thyromegaly present.  Cardiovascular: Normal rate, regular rhythm and normal heart sounds.  Pulmonary/Chest: Effort normal. No respiratory distress. He has no wheezes. He has rales. He  exhibits no tenderness.  Mild Rales  Abdominal: Soft. Bowel sounds are normal. He exhibits no distension. There is no tenderness.  Musculoskeletal: Normal range of motion.  Neurological: He is alert and oriented to person, place, and time. No cranial nerve deficit.  Skin: Skin is warm and dry. No rash noted.   LABORATORY PANEL:  Male CBC Recent Labs  Lab 12/17/17 0529  WBC 11.5*  HGB 11.7*  HCT 34.8*  PLT 128*   ------------------------------------------------------------------------------------------------------------------ Chemistries  Recent Labs  Lab 12/17/17 0529  NA 139  139  K 4.0  4.0  CL 102  103  CO2 27  27  GLUCOSE 120*  124*  BUN 62*  61*  CREATININE 2.58*  2.51*  CALCIUM 8.5*  8.6*  MG 2.7*   RADIOLOGY:  No results found. ASSESSMENT AND PLAN:   1.  Acute on chronic respiratory failure with hypoxia, secondary to pneumonia and acute pulmonary edema.  off BiPAP now, continue DuoNebs, oxygen therapy.  continue IV cefepime. 2.  Sepsis: present on admission secondary to pneumonia.  continue IV cefepime and oxygen therapy. 3.  Acute pulmonary edema. Likely acute on chronic systolic CHF: last reported EF 20%, continue IV Lasix twice daily.  4.  RLL CAP: continue IV antibiotics, oxygen therapy and nebulizer treatments. 5.  CKD 3, creatinine is at baseline, 1.97. Worsening. continue to monitor kidney function closely and avoid nephrotoxic medications. 6.  Borderline elevated troponin level is 0.03, likely related to demand ischemia, from acute respiratory failure. continue asa 7.  Hypertension, stable, to new home medications. 8.  Hyperlipidemia, on statin.  All the records are reviewed and case discussed with Care Management/Social Worker. Management plans discussed with the patient, his sons and they are in  agreement.  CODE STATUS: DNR  TOTAL TIME TAKING CARE OF THIS PATIENT: 35 minutes.   More than 50% of the time was spent in  counseling/coordination of care: YES  POSSIBLE D/C IN 1-2 DAYS, DEPENDING ON CLINICAL CONDITION.   Demetrios Loll M.D on 12/17/2017 at 5:46 PM  Between 7am to 6pm - Pager - [phone]  After 6pm go to [email or web address] - password [email or web address] [facility]  [employer]  Hospitalists  Office  [phone]  CC: Primary care physician; Jerrol Banana., MD  Note: This dictation was prepared with Dragon dictation along with smaller phrase technology. Any transcriptional errors that result from this process are unintentional.

## 2017-12-18 LAB — BASIC METABOLIC PANEL
Anion gap: 11 (ref 5–15)
BUN: 65 mg/dL — AB (ref 8–23)
CHLORIDE: 102 mmol/L (ref 98–111)
CO2: 27 mmol/L (ref 22–32)
CREATININE: 2.24 mg/dL — AB (ref 0.61–1.24)
Calcium: 8.5 mg/dL — ABNORMAL LOW (ref 8.9–10.3)
GFR calc Af Amer: 31 mL/min — ABNORMAL LOW (ref >60)
GFR calc non Af Amer: 27 mL/min — ABNORMAL LOW (ref >60)
Glucose, Bld: 95 mg/dL (ref 70–99)
POTASSIUM: 3.8 mmol/L (ref 3.5–5.1)
Sodium: 140 mmol/L (ref 135–145)

## 2017-12-18 LAB — GLUCOSE, CAPILLARY: Glucose-Capillary: 91 mg/dL (ref 70–99)

## 2017-12-18 LAB — MAGNESIUM: Magnesium: 2.5 mg/dL — ABNORMAL HIGH (ref 1.7–2.4)

## 2017-12-18 MED ORDER — LEVOFLOXACIN 750 MG PO TABS
750.0000 mg | ORAL_TABLET | ORAL | Status: DC
  Administered 2017-12-18: 750 mg via ORAL
  Filled 2017-12-18: qty 1

## 2017-12-18 MED ORDER — FUROSEMIDE 20 MG PO TABS: 20.0000 mg | ORAL_TABLET | Freq: Two times a day (BID) | ORAL | 1 refills | Status: DC

## 2017-12-18 MED ORDER — FUROSEMIDE 20 MG PO TABS
20.0000 mg | ORAL_TABLET | Freq: Two times a day (BID) | ORAL | Status: DC
  Administered 2017-12-18: 20 mg via ORAL
  Filled 2017-12-18: qty 1

## 2017-12-18 MED ORDER — LEVOFLOXACIN 750 MG PO TABS: 750.0000 mg | ORAL_TABLET | ORAL | 0 refills | Status: DC

## 2017-12-18 NOTE — Progress Notes (Signed)
Patient is discharge home in a stable condition via wheel chair, summary and f/u care given , verbalized understanding . Left with grandson.

## 2017-12-18 NOTE — Discharge Summary (Signed)
[place] at [place] NAME: Phillip Allison    MR#:  [id number]  DATE OF BIRTH:  01-17-1941  DATE OF ADMISSION:  12/15/2017   ADMITTING PHYSICIAN: Amelia Jo, MD  DATE OF DISCHARGE: 12/18/2017 PRIMARY CARE PHYSICIAN: Jerrol Banana., MD   ADMISSION DIAGNOSIS:  Acute respiratory failure, unspecified whether with hypoxia or hypercapnia (HCC) [J96.00] Leukocytosis, unspecified type [D72.829] Pneumonia due to infectious organism, unspecified laterality, unspecified part of lung [J18.9] Sepsis ([place]) [A41.9] DISCHARGE DIAGNOSIS:  Active Problems:   Sepsis ([place])  SECONDARY DIAGNOSIS:   Past Medical History:  Diagnosis Date  . CHF (congestive heart failure) ([place])   . COPD (chronic obstructive pulmonary disease) ([place])   . Depression   . Emphysema lung ([place])   . Hypercholesterolemia   . Hypertension   . Myocardial infarction ([place])   . On home oxygen therapy    2 liters at night and as needed  . Pneumonia    in the past  . Presence of permanent cardiac pacemaker   . Shortness of breath dyspnea    with exertion   HOSPITAL COURSE:   1.Acute on chronic respiratory failure with hypoxia,secondary to pneumonia and acute pulmonary edema.off BiPAP, on O2 [place] 2 L, continue DuoNebs,oxygen therapy. He was on IV cefepime.  2.Sepsis: present on admissionsecondary to pneumonia. He has been on IV cefepime and oxygen therapy. Change to po levaquin Q48 hrs for 2 more doses.  3.Acute pulmonary edema. Likely acute on chronic systolic CHF: last reported EF 20%, he was on IV Lasix 20 mg twice daily. Changed to po bid. Continue coreg, but no ACEI due to ARF on CKD.  4. RLL CAP: continue IV antibiotics,oxygen therapy and nebulizer treatments. 5. ARF onCKD 3,creatinine is at baseline,1.97. Cr. 2.24 today. Stable,follow up BMP as outpatient and avoid nephrotoxic medications.  6.Borderline elevated troponin level is  0.03,likely related to demand ischemia,from acute respiratory failure.continue asa 7.Hypertension,stable,continue  home medications. 8.Hyperlipidemia,on statin. Weakness, HHPT. DISCHARGE CONDITIONS:  Stable ,discharge to home with HHPT today. CONSULTS OBTAINED:  Treatment Team:  Yolonda Kida, MD Teodoro Spray, MD DRUG ALLERGIES:  No Known Allergies DISCHARGE MEDICATIONS:   Allergies as of 12/18/2017   No Known Allergies     Medication List    TAKE these medications   albuterol (2.5 MG/3ML) 0.083% nebulizer solution Commonly known as:  PROVENTIL Take 3 mLs (2.5 mg total) by nebulization every 4 (four) hours as needed for wheezing or shortness of breath.   amLODipine 5 MG tablet Commonly known as:  NORVASC Take 1 tablet (5 mg total) by mouth daily.   aspirin 81 MG tablet Take 81 mg by mouth at bedtime.   COREG 12.5 MG tablet Generic drug:  carvedilol Take 12.5 mg by mouth 2 (two) times daily with a meal.   furosemide 20 MG tablet Commonly known as:  LASIX Take 1 tablet (20 mg total) by mouth 2 (two) times daily. What changed:  when to take this   levofloxacin 750 MG tablet Commonly known as:  LEVAQUIN Take 1 tablet (750 mg total) by mouth every other day.   PARoxetine 20 MG tablet Commonly known as:  PAXIL TAKE 1 TABLET(20 MG) BY MOUTH AT BEDTIME   pravastatin 40 MG tablet Commonly known as:  PRAVACHOL TAKE 1 TABLET BY MOUTH AT BEDTIME        DISCHARGE INSTRUCTIONS:  See AVS.  If you experience worsening of your admission symptoms, develop shortness of breath,  life threatening emergency, suicidal or homicidal thoughts you must seek medical attention immediately by calling 911 or calling your MD immediately  if symptoms less severe.  You Must read complete instructions/literature along with all the possible adverse reactions/side effects for all the Medicines you take and that have been prescribed to you. Take any new Medicines after you  have completely understood and accpet all the possible adverse reactions/side effects.   Please note  You were cared for by a hospitalist during your hospital stay. If you have any questions about your discharge medications or the care you received while you were in the hospital after you are discharged, you can call the unit and asked to speak with the hospitalist on call if the hospitalist that took care of you is not available. Once you are discharged, your primary care physician will handle any further medical issues. Please note that NO REFILLS for any discharge medications will be authorized once you are discharged, as it is imperative that you return to your primary care physician (or establish a relationship with a primary care physician if you do not have one) for your aftercare needs so that they can reassess your need for medications and monitor your lab values.    On the day of Discharge:  VITAL SIGNS:  Blood pressure (!) 168/92, pulse 74, temperature 97.8 F (36.6 C), temperature source Oral, resp. rate 18, height 5\' 11"  (1.803 m), weight 173 lb 4.8 oz (78.6 kg), SpO2 92 %. PHYSICAL EXAMINATION:  GENERAL:  77 y.o.-year-old patient lying in the bed with no acute distress.  EYES: Pupils equal, round, reactive to light and accommodation. No scleral icterus. Extraocular muscles intact.  HEENT: Head atraumatic, normocephalic. Oropharynx and nasopharynx clear.  NECK:  Supple, no jugular venous distention. No thyroid enlargement, no tenderness.  LUNGS: Normal breath sounds bilaterally, no wheezing, rales,rhonchi or crepitation. No use of accessory muscles of respiration.  CARDIOVASCULAR: S1, S2 normal. No murmurs, rubs, or gallops.  ABDOMEN: Soft, non-tender, non-distended. Bowel sounds present. No organomegaly or mass.  EXTREMITIES: No pedal edema, cyanosis, or clubbing.  NEUROLOGIC: Cranial nerves II through XII are intact. Muscle strength 4/5 in all extremities. Sensation intact. Gait  not checked.  PSYCHIATRIC: The patient is alert and oriented x 3.  SKIN: No obvious rash, lesion, or ulcer.  DATA REVIEW:   CBC Recent Labs  Lab 12/17/17 0529  WBC 11.5*  HGB 11.7*  HCT 34.8*  PLT 128*    Chemistries  Recent Labs  Lab 12/18/17 0420  NA 140  K 3.8  CL 102  CO2 27  GLUCOSE 95  BUN 65*  CREATININE 2.24*  CALCIUM 8.5*  MG 2.5*     Microbiology Results  Results for orders placed or performed during the hospital encounter of 12/15/17  MRSA PCR Screening     Status: None   Collection Time: 12/16/17  1:51 AM  Result Value Ref Range Status   MRSA by PCR NEGATIVE NEGATIVE Final    Comment:        The GeneXpert MRSA Assay (FDA approved for NASAL specimens only), is one component of a comprehensive MRSA colonization surveillance program. It is not intended to diagnose MRSA infection nor to guide or monitor treatment for MRSA infections. Performed at [facility], [address], [place], [place] [address]     RADIOLOGY:  No results found.   Management plans discussed with the patient, family and they are in agreement.  CODE STATUS: DNR   TOTAL TIME  TAKING CARE OF THIS PATIENT: 33 minutes.    Demetrios Loll M.D on 12/18/2017 at 9:01 AM  Between 7am to 6pm - Pager - [phone]  After 6pm go to [email or web address] - password [email or web address] [facility]  [employer] [place] Hospitalists  Office  [phone]  CC: Primary care physician; Jerrol Banana., MD   Note: This dictation was prepared with Dragon dictation along with smaller phrase technology. Any transcriptional errors that result from this process are unintentional.

## 2017-12-18 NOTE — Care Management Important Message (Signed)
Patient left before follow up IM could be delivered.

## 2017-12-18 NOTE — Progress Notes (Signed)
Pharmacy Antibiotic Note  Phillip Allison. is a 77 y.o. male admitted on 12/15/2017 with pneumonia/sepsis.  Pharmacy has been consulted for Levaquin dosing.  Plan: Levaquin 750 mg po q 48 hours.   Height: 5\' 11"  (180.3 cm) Weight: 173 lb 4.8 oz (78.6 kg) IBW/kg (Calculated) : 75.3  Temp (24hrs), Avg:97.9 F (36.6 C), Min:97.8 F (36.6 C), Max:98.2 F (36.8 C)  Recent Labs  Lab 12/15/17 2047 12/16/17 0558 12/17/17 0529 12/18/17 0420  WBC 16.8* 5.2 11.5*  --   CREATININE 1.97* 2.05* 2.58*  2.51* 2.24*  LATICACIDVEN 0.6  --   --   --     Estimated Creatinine Clearance: 30.3 mL/min (A) (by C-G formula based on SCr of 2.24 mg/dL (H)).    No Known Allergies  Antimicrobials this admission: Cefepime 6/24 >> 6/27 Vancomycin 6/24 >> 6/25  Dose adjustments this admission: 6/25 Cefepime transitioned to 1g IV Q12hr.   Microbiology results: 6/25 MRSA PCR: negative   Thank you for allowing pharmacy to be a part of this patient's care.  Napoleon Form 12/18/2017 8:38 AM

## 2017-12-18 NOTE — Care Management (Signed)
Patient for discharge home today with orders for home health nursing and physical therapy.  Patient's family has brought portable oxygen to room for transport home.  [facility] and has accepted the referral

## 2017-12-18 NOTE — Plan of Care (Signed)
  Problem: Education: Goal: Knowledge of General Education information will improve Outcome: Progressing   Problem: Health Behavior/Discharge Planning: Goal: Ability to manage health-related needs will improve Outcome: Progressing   Problem: Clinical Measurements: Goal: Ability to maintain clinical measurements within normal limits will improve Outcome: Progressing Goal: Will remain free from infection Outcome: Progressing Goal: Diagnostic test results will improve Outcome: Progressing Goal: Respiratory complications will improve Outcome: Progressing Goal: Cardiovascular complication will be avoided Outcome: Progressing   Problem: Activity: Goal: Risk for activity intolerance will decrease Outcome: Progressing   Problem: Nutrition: Goal: Adequate nutrition will be maintained Outcome: Progressing   Problem: Coping: Goal: Level of anxiety will decrease Outcome: Progressing   Problem: Elimination: Goal: Will not experience complications related to bowel motility Outcome: Progressing Goal: Will not experience complications related to urinary retention Outcome: Progressing   Problem: Pain Managment: Goal: General experience of comfort will improve Outcome: Progressing   Problem: Safety: Goal: Ability to remain free from injury will improve Outcome: Progressing   Problem: Skin Integrity: Goal: Risk for impaired skin integrity will decrease Outcome: Progressing   Problem: Fluid Volume: Goal: Hemodynamic stability will improve Outcome: Progressing   Problem: Clinical Measurements: Goal: Diagnostic test results will improve Outcome: Progressing Goal: Signs and symptoms of infection will decrease Outcome: Progressing   Problem: Respiratory: Goal: Ability to maintain adequate ventilation will improve Outcome: Progressing

## 2017-12-19 ENCOUNTER — Telehealth: Payer: Self-pay

## 2017-12-19 NOTE — Telephone Encounter (Signed)
Pt son returned call.  teri

## 2017-12-20 DIAGNOSIS — J9621 Acute and chronic respiratory failure with hypoxia: Secondary | ICD-10-CM | POA: Diagnosis not present

## 2017-12-20 DIAGNOSIS — I130 Hypertensive heart and chronic kidney disease with heart failure and stage 1 through stage 4 chronic kidney disease, or unspecified chronic kidney disease: Secondary | ICD-10-CM | POA: Diagnosis not present

## 2017-12-20 DIAGNOSIS — Z7951 Long term (current) use of inhaled steroids: Secondary | ICD-10-CM | POA: Diagnosis not present

## 2017-12-20 DIAGNOSIS — Z87891 Personal history of nicotine dependence: Secondary | ICD-10-CM | POA: Diagnosis not present

## 2017-12-20 DIAGNOSIS — J189 Pneumonia, unspecified organism: Secondary | ICD-10-CM | POA: Diagnosis not present

## 2017-12-20 DIAGNOSIS — Z9989 Dependence on other enabling machines and devices: Secondary | ICD-10-CM | POA: Diagnosis not present

## 2017-12-20 DIAGNOSIS — A419 Sepsis, unspecified organism: Secondary | ICD-10-CM | POA: Diagnosis not present

## 2017-12-20 DIAGNOSIS — Z950 Presence of cardiac pacemaker: Secondary | ICD-10-CM | POA: Diagnosis not present

## 2017-12-20 DIAGNOSIS — J440 Chronic obstructive pulmonary disease with acute lower respiratory infection: Secondary | ICD-10-CM | POA: Diagnosis not present

## 2017-12-20 DIAGNOSIS — N183 Chronic kidney disease, stage 3 (moderate): Secondary | ICD-10-CM | POA: Diagnosis not present

## 2017-12-20 DIAGNOSIS — E1122 Type 2 diabetes mellitus with diabetic chronic kidney disease: Secondary | ICD-10-CM | POA: Diagnosis not present

## 2017-12-20 DIAGNOSIS — I2510 Atherosclerotic heart disease of native coronary artery without angina pectoris: Secondary | ICD-10-CM | POA: Diagnosis not present

## 2017-12-20 DIAGNOSIS — I509 Heart failure, unspecified: Secondary | ICD-10-CM | POA: Diagnosis not present

## 2017-12-20 DIAGNOSIS — Z9981 Dependence on supplemental oxygen: Secondary | ICD-10-CM | POA: Diagnosis not present

## 2017-12-20 DIAGNOSIS — I252 Old myocardial infarction: Secondary | ICD-10-CM | POA: Diagnosis not present

## 2017-12-20 DIAGNOSIS — F329 Major depressive disorder, single episode, unspecified: Secondary | ICD-10-CM | POA: Diagnosis not present

## 2017-12-22 ENCOUNTER — Telehealth: Payer: Self-pay | Admitting: Family Medicine

## 2017-12-22 NOTE — Telephone Encounter (Signed)
ok 

## 2017-12-22 NOTE — Telephone Encounter (Signed)
[facility] requesting verbal order skilled nursing 1 week 1, 2 week 4, 1 week 4 and 3 PRN's.

## 2017-12-22 NOTE — Telephone Encounter (Signed)
Transition Care Management Follow-up Telephone Call  How have you been since you were released from the hospital? Pt has been doing well, better than last time when d/c. B/p has been slightly elevated at times (124-169/69-88). Declines pain, fever, sob, n/v/d. Has had a productive cough- clear sputum.   Do you understand why you were in the hospital? yes  Do you have a copy of your discharge instructions Yes Do you understand the discharge instrcutions? yes  Where were you discharged to? Home  Do you have support at home? Yes    Items Reviewed:  Medications obtained Yes  Medications reviewed: Yes  Dietary changes reviewed: yes, low sodium heart healthy  Home Health? Yes, [facility]  DME ordered at discharge obtained? NA  Medical supplies: NA    Functional Questionnaire:   Activities of Daily Living (ADLs):   He states they are independent in the following: ambulation, bathing and hygiene, feeding, continence, grooming, toileting and dressing States they require assistance with the following: medication management  Any transportation issues/concerns?: no  Any patient concerns? no  Confirmed importance and date/time of follow-up visits scheduled with PCP: yes, 01/06/18 @ 3 PM.  Confirm appointment scheduled with specialist? Yes  Confirmed with patient if condition begins to worsen call PCP or If it's emergency go to the ER.

## 2017-12-23 DIAGNOSIS — J9621 Acute and chronic respiratory failure with hypoxia: Secondary | ICD-10-CM | POA: Diagnosis not present

## 2017-12-23 DIAGNOSIS — I130 Hypertensive heart and chronic kidney disease with heart failure and stage 1 through stage 4 chronic kidney disease, or unspecified chronic kidney disease: Secondary | ICD-10-CM | POA: Diagnosis not present

## 2017-12-23 DIAGNOSIS — I2510 Atherosclerotic heart disease of native coronary artery without angina pectoris: Secondary | ICD-10-CM | POA: Diagnosis not present

## 2017-12-23 DIAGNOSIS — A419 Sepsis, unspecified organism: Secondary | ICD-10-CM | POA: Diagnosis not present

## 2017-12-23 DIAGNOSIS — J440 Chronic obstructive pulmonary disease with acute lower respiratory infection: Secondary | ICD-10-CM | POA: Diagnosis not present

## 2017-12-23 DIAGNOSIS — J189 Pneumonia, unspecified organism: Secondary | ICD-10-CM | POA: Diagnosis not present

## 2017-12-23 NOTE — Telephone Encounter (Signed)
Advised as below.  

## 2017-12-24 DIAGNOSIS — A419 Sepsis, unspecified organism: Secondary | ICD-10-CM | POA: Diagnosis not present

## 2017-12-24 DIAGNOSIS — J9621 Acute and chronic respiratory failure with hypoxia: Secondary | ICD-10-CM | POA: Diagnosis not present

## 2017-12-24 DIAGNOSIS — I2510 Atherosclerotic heart disease of native coronary artery without angina pectoris: Secondary | ICD-10-CM | POA: Diagnosis not present

## 2017-12-24 DIAGNOSIS — J189 Pneumonia, unspecified organism: Secondary | ICD-10-CM | POA: Diagnosis not present

## 2017-12-24 DIAGNOSIS — I130 Hypertensive heart and chronic kidney disease with heart failure and stage 1 through stage 4 chronic kidney disease, or unspecified chronic kidney disease: Secondary | ICD-10-CM | POA: Diagnosis not present

## 2017-12-24 DIAGNOSIS — J440 Chronic obstructive pulmonary disease with acute lower respiratory infection: Secondary | ICD-10-CM | POA: Diagnosis not present

## 2017-12-26 DIAGNOSIS — J9621 Acute and chronic respiratory failure with hypoxia: Secondary | ICD-10-CM | POA: Diagnosis not present

## 2017-12-26 DIAGNOSIS — J440 Chronic obstructive pulmonary disease with acute lower respiratory infection: Secondary | ICD-10-CM | POA: Diagnosis not present

## 2017-12-26 DIAGNOSIS — A419 Sepsis, unspecified organism: Secondary | ICD-10-CM | POA: Diagnosis not present

## 2017-12-26 DIAGNOSIS — I2510 Atherosclerotic heart disease of native coronary artery without angina pectoris: Secondary | ICD-10-CM | POA: Diagnosis not present

## 2017-12-26 DIAGNOSIS — I130 Hypertensive heart and chronic kidney disease with heart failure and stage 1 through stage 4 chronic kidney disease, or unspecified chronic kidney disease: Secondary | ICD-10-CM | POA: Diagnosis not present

## 2017-12-26 DIAGNOSIS — J189 Pneumonia, unspecified organism: Secondary | ICD-10-CM | POA: Diagnosis not present

## 2017-12-30 ENCOUNTER — Ambulatory Visit: Payer: Self-pay | Admitting: Family Medicine

## 2017-12-30 DIAGNOSIS — J9621 Acute and chronic respiratory failure with hypoxia: Secondary | ICD-10-CM | POA: Diagnosis not present

## 2017-12-30 DIAGNOSIS — I130 Hypertensive heart and chronic kidney disease with heart failure and stage 1 through stage 4 chronic kidney disease, or unspecified chronic kidney disease: Secondary | ICD-10-CM | POA: Diagnosis not present

## 2017-12-30 DIAGNOSIS — J440 Chronic obstructive pulmonary disease with acute lower respiratory infection: Secondary | ICD-10-CM | POA: Diagnosis not present

## 2017-12-30 DIAGNOSIS — I2510 Atherosclerotic heart disease of native coronary artery without angina pectoris: Secondary | ICD-10-CM | POA: Diagnosis not present

## 2017-12-30 DIAGNOSIS — A419 Sepsis, unspecified organism: Secondary | ICD-10-CM | POA: Diagnosis not present

## 2017-12-30 DIAGNOSIS — J189 Pneumonia, unspecified organism: Secondary | ICD-10-CM | POA: Diagnosis not present

## 2018-01-01 DIAGNOSIS — I10 Essential (primary) hypertension: Secondary | ICD-10-CM | POA: Diagnosis not present

## 2018-01-01 DIAGNOSIS — I130 Hypertensive heart and chronic kidney disease with heart failure and stage 1 through stage 4 chronic kidney disease, or unspecified chronic kidney disease: Secondary | ICD-10-CM | POA: Diagnosis not present

## 2018-01-01 DIAGNOSIS — J189 Pneumonia, unspecified organism: Secondary | ICD-10-CM | POA: Diagnosis not present

## 2018-01-01 DIAGNOSIS — R748 Abnormal levels of other serum enzymes: Secondary | ICD-10-CM | POA: Diagnosis not present

## 2018-01-01 DIAGNOSIS — A419 Sepsis, unspecified organism: Secondary | ICD-10-CM | POA: Diagnosis not present

## 2018-01-01 DIAGNOSIS — J9621 Acute and chronic respiratory failure with hypoxia: Secondary | ICD-10-CM | POA: Diagnosis not present

## 2018-01-01 DIAGNOSIS — J440 Chronic obstructive pulmonary disease with acute lower respiratory infection: Secondary | ICD-10-CM | POA: Diagnosis not present

## 2018-01-01 DIAGNOSIS — I2510 Atherosclerotic heart disease of native coronary artery without angina pectoris: Secondary | ICD-10-CM | POA: Diagnosis not present

## 2018-01-01 DIAGNOSIS — I255 Ischemic cardiomyopathy: Secondary | ICD-10-CM | POA: Diagnosis not present

## 2018-01-01 DIAGNOSIS — I5023 Acute on chronic systolic (congestive) heart failure: Secondary | ICD-10-CM | POA: Diagnosis not present

## 2018-01-02 ENCOUNTER — Encounter: Payer: Self-pay | Admitting: *Deleted

## 2018-01-05 DIAGNOSIS — J9621 Acute and chronic respiratory failure with hypoxia: Secondary | ICD-10-CM | POA: Diagnosis not present

## 2018-01-05 DIAGNOSIS — I2510 Atherosclerotic heart disease of native coronary artery without angina pectoris: Secondary | ICD-10-CM | POA: Diagnosis not present

## 2018-01-05 DIAGNOSIS — A419 Sepsis, unspecified organism: Secondary | ICD-10-CM | POA: Diagnosis not present

## 2018-01-05 DIAGNOSIS — J189 Pneumonia, unspecified organism: Secondary | ICD-10-CM | POA: Diagnosis not present

## 2018-01-05 DIAGNOSIS — J440 Chronic obstructive pulmonary disease with acute lower respiratory infection: Secondary | ICD-10-CM | POA: Diagnosis not present

## 2018-01-05 DIAGNOSIS — I130 Hypertensive heart and chronic kidney disease with heart failure and stage 1 through stage 4 chronic kidney disease, or unspecified chronic kidney disease: Secondary | ICD-10-CM | POA: Diagnosis not present

## 2018-01-06 ENCOUNTER — Encounter: Payer: Self-pay | Admitting: Family Medicine

## 2018-01-06 ENCOUNTER — Ambulatory Visit (INDEPENDENT_AMBULATORY_CARE_PROVIDER_SITE_OTHER): Payer: Medicare Other | Admitting: Family Medicine

## 2018-01-06 VITALS — BP 136/70 | HR 64 | Temp 98.3°F | Resp 16 | Ht 71.0 in | Wt 175.0 lb

## 2018-01-06 DIAGNOSIS — I255 Ischemic cardiomyopathy: Secondary | ICD-10-CM | POA: Diagnosis not present

## 2018-01-06 DIAGNOSIS — I5022 Chronic systolic (congestive) heart failure: Secondary | ICD-10-CM | POA: Diagnosis not present

## 2018-01-06 DIAGNOSIS — Z87891 Personal history of nicotine dependence: Secondary | ICD-10-CM

## 2018-01-06 DIAGNOSIS — J449 Chronic obstructive pulmonary disease, unspecified: Secondary | ICD-10-CM | POA: Diagnosis not present

## 2018-01-06 DIAGNOSIS — Z95810 Presence of automatic (implantable) cardiac defibrillator: Secondary | ICD-10-CM | POA: Diagnosis not present

## 2018-01-06 DIAGNOSIS — A419 Sepsis, unspecified organism: Secondary | ICD-10-CM

## 2018-01-06 NOTE — Progress Notes (Signed)
Patient: Phillip Allison. Male    DOB: 16-Jan-1941   77 y.o.   MRN: [id number] Visit Date: 01/06/2018  Today's Provider: Wilhemena Durie, MD   Chief Complaint  Patient presents with  . Hospitalization Follow-up   Subjective:    HPI  Follow up Hospitalization  Patient was admitted to [facility] on 12/15/17 and discharged on 12/18/17. He was treated for acute respiratory failure. Treatment for this included levaquin 750mg  every other day .  He reports good compliance with treatment. He reports this condition is Improved.       No Known Allergies   Current Outpatient Medications:  .  albuterol (PROVENTIL) (2.5 MG/3ML) 0.083% nebulizer solution, Take 3 mLs (2.5 mg total) by nebulization every 4 (four) hours as needed for wheezing or shortness of breath., Disp: 120 mL, Rfl: 12 .  amLODipine (NORVASC) 5 MG tablet, Take 1 tablet (5 mg total) by mouth daily., Disp: 30 tablet, Rfl: 1 .  aspirin 81 MG tablet, Take 81 mg by mouth at bedtime. , Disp: , Rfl:  .  carvedilol (COREG) 12.5 MG tablet, Take 12.5 mg by mouth 2 (two) times daily with a meal. , Disp: , Rfl:  .  furosemide (LASIX) 20 MG tablet, Take 1 tablet (20 mg total) by mouth 2 (two) times daily., Disp: 60 tablet, Rfl: 1 .  PARoxetine (PAXIL) 20 MG tablet, TAKE 1 TABLET(20 MG) BY MOUTH AT BEDTIME, Disp: 90 tablet, Rfl: 3 .  pravastatin (PRAVACHOL) 40 MG tablet, TAKE 1 TABLET BY MOUTH AT BEDTIME, Disp: 90 tablet, Rfl: 3 .  levofloxacin (LEVAQUIN) 750 MG tablet, Take 1 tablet (750 mg total) by mouth every other day. (Patient not taking: Reported on 01/06/2018), Disp: 2 tablet, Rfl: 0  Review of Systems  Constitutional: Negative for activity change, appetite change, chills, diaphoresis, fatigue, fever and unexpected weight change.  HENT: Negative.   Eyes: Negative.   Respiratory: Positive for shortness of breath.   Cardiovascular: Negative for chest pain, palpitations and leg swelling.  Gastrointestinal: Negative.    Endocrine: Negative.   Musculoskeletal: Negative.   Allergic/Immunologic: Negative.   Neurological: Positive for weakness. Negative for dizziness, light-headedness and headaches.  Psychiatric/Behavioral: Negative.     Social History   Tobacco Use  . Smoking status: Former Smoker    Packs/day: 0.75    Years: 54.00    Pack years: 40.50    Types: Cigarettes    Last attempt to quit: 2014    Years since quitting: 5.5  . Smokeless tobacco: Never Used  . Tobacco comment: 2014?  Substance Use Topics  . Alcohol use: No   Objective:   BP 136/70 (BP Location: Right Arm, Patient Position: Sitting, Cuff Size: Normal)   Pulse 64   Temp 98.3 F (36.8 C)   Resp 16   Ht 5\' 11"  (1.803 m)   Wt 175 lb (79.4 kg)   SpO2 95% Comment: on 2L of O2  BMI 24.41 kg/m  Vitals:   01/06/18 1518  BP: 136/70  Pulse: 64  Resp: 16  Temp: 98.3 F (36.8 C)  SpO2: 95%  Weight: 175 lb (79.4 kg)  Height: 5\' 11"  (1.803 m)     Physical Exam  Constitutional: He is oriented to person, place, and time. He appears well-developed and well-nourished.  HENT:  Head: Normocephalic and atraumatic.  Right Ear: External ear normal.  Left Ear: External ear normal.  Nose: Nose normal.  Eyes: Conjunctivae are normal. No scleral icterus.  Neck: No thyromegaly present.  Cardiovascular: Normal rate, regular rhythm and normal heart sounds.  Pulmonary/Chest: Effort normal and breath sounds normal.  Abdominal: Soft.  Musculoskeletal: He exhibits no edema.  Lymphadenopathy:    He has no cervical adenopathy.  Neurological: He is alert and oriented to person, place, and time.  Skin: Skin is warm and dry.  Psychiatric: He has a normal mood and affect. His behavior is normal. Judgment and thought content normal.        Assessment & Plan:     Respiratory Failure/COPD/CHF All issues presently controlled./treated. Pt uses Bipap at night. Consider Palliative care in future. Ischemic  Cardiomyopathy--EF20% Afib HLD Malnutrition Sepsis Resolved.      I have done the exam and reviewed the above chart and it is accurate to the best of my knowledge. Development worker, community has been used in this note in any air is in the dictation or transcription are unintentional.  Wilhemena Durie, MD  [place]

## 2018-01-09 DIAGNOSIS — J189 Pneumonia, unspecified organism: Secondary | ICD-10-CM | POA: Diagnosis not present

## 2018-01-09 DIAGNOSIS — I130 Hypertensive heart and chronic kidney disease with heart failure and stage 1 through stage 4 chronic kidney disease, or unspecified chronic kidney disease: Secondary | ICD-10-CM | POA: Diagnosis not present

## 2018-01-09 DIAGNOSIS — J440 Chronic obstructive pulmonary disease with acute lower respiratory infection: Secondary | ICD-10-CM | POA: Diagnosis not present

## 2018-01-09 DIAGNOSIS — I2510 Atherosclerotic heart disease of native coronary artery without angina pectoris: Secondary | ICD-10-CM | POA: Diagnosis not present

## 2018-01-09 DIAGNOSIS — J9621 Acute and chronic respiratory failure with hypoxia: Secondary | ICD-10-CM | POA: Diagnosis not present

## 2018-01-09 DIAGNOSIS — A419 Sepsis, unspecified organism: Secondary | ICD-10-CM | POA: Diagnosis not present

## 2018-01-13 DIAGNOSIS — J189 Pneumonia, unspecified organism: Secondary | ICD-10-CM | POA: Diagnosis not present

## 2018-01-13 DIAGNOSIS — A419 Sepsis, unspecified organism: Secondary | ICD-10-CM | POA: Diagnosis not present

## 2018-01-13 DIAGNOSIS — I130 Hypertensive heart and chronic kidney disease with heart failure and stage 1 through stage 4 chronic kidney disease, or unspecified chronic kidney disease: Secondary | ICD-10-CM | POA: Diagnosis not present

## 2018-01-13 DIAGNOSIS — I2510 Atherosclerotic heart disease of native coronary artery without angina pectoris: Secondary | ICD-10-CM | POA: Diagnosis not present

## 2018-01-13 DIAGNOSIS — J9621 Acute and chronic respiratory failure with hypoxia: Secondary | ICD-10-CM | POA: Diagnosis not present

## 2018-01-13 DIAGNOSIS — J440 Chronic obstructive pulmonary disease with acute lower respiratory infection: Secondary | ICD-10-CM | POA: Diagnosis not present

## 2018-01-15 DIAGNOSIS — J9621 Acute and chronic respiratory failure with hypoxia: Secondary | ICD-10-CM | POA: Diagnosis not present

## 2018-01-15 DIAGNOSIS — I130 Hypertensive heart and chronic kidney disease with heart failure and stage 1 through stage 4 chronic kidney disease, or unspecified chronic kidney disease: Secondary | ICD-10-CM | POA: Diagnosis not present

## 2018-01-15 DIAGNOSIS — J440 Chronic obstructive pulmonary disease with acute lower respiratory infection: Secondary | ICD-10-CM | POA: Diagnosis not present

## 2018-01-15 DIAGNOSIS — J189 Pneumonia, unspecified organism: Secondary | ICD-10-CM | POA: Diagnosis not present

## 2018-01-15 DIAGNOSIS — A419 Sepsis, unspecified organism: Secondary | ICD-10-CM | POA: Diagnosis not present

## 2018-01-15 DIAGNOSIS — I2510 Atherosclerotic heart disease of native coronary artery without angina pectoris: Secondary | ICD-10-CM | POA: Diagnosis not present

## 2018-01-22 ENCOUNTER — Telehealth: Payer: Self-pay | Admitting: *Deleted

## 2018-01-22 DIAGNOSIS — I130 Hypertensive heart and chronic kidney disease with heart failure and stage 1 through stage 4 chronic kidney disease, or unspecified chronic kidney disease: Secondary | ICD-10-CM | POA: Diagnosis not present

## 2018-01-22 DIAGNOSIS — A419 Sepsis, unspecified organism: Secondary | ICD-10-CM | POA: Diagnosis not present

## 2018-01-22 DIAGNOSIS — J189 Pneumonia, unspecified organism: Secondary | ICD-10-CM | POA: Diagnosis not present

## 2018-01-22 DIAGNOSIS — J9621 Acute and chronic respiratory failure with hypoxia: Secondary | ICD-10-CM | POA: Diagnosis not present

## 2018-01-22 DIAGNOSIS — I2510 Atherosclerotic heart disease of native coronary artery without angina pectoris: Secondary | ICD-10-CM | POA: Diagnosis not present

## 2018-01-22 DIAGNOSIS — J440 Chronic obstructive pulmonary disease with acute lower respiratory infection: Secondary | ICD-10-CM | POA: Diagnosis not present

## 2018-01-22 NOTE — Telephone Encounter (Signed)
Contacted patient regarding recommended follow up imaging of prior lung screening scan. Patient refuses additional imaging at this time but requests a return call in 2 months.

## 2018-01-28 DIAGNOSIS — J440 Chronic obstructive pulmonary disease with acute lower respiratory infection: Secondary | ICD-10-CM | POA: Diagnosis not present

## 2018-01-28 DIAGNOSIS — A419 Sepsis, unspecified organism: Secondary | ICD-10-CM | POA: Diagnosis not present

## 2018-01-28 DIAGNOSIS — J189 Pneumonia, unspecified organism: Secondary | ICD-10-CM | POA: Diagnosis not present

## 2018-01-28 DIAGNOSIS — I2510 Atherosclerotic heart disease of native coronary artery without angina pectoris: Secondary | ICD-10-CM | POA: Diagnosis not present

## 2018-01-28 DIAGNOSIS — I130 Hypertensive heart and chronic kidney disease with heart failure and stage 1 through stage 4 chronic kidney disease, or unspecified chronic kidney disease: Secondary | ICD-10-CM | POA: Diagnosis not present

## 2018-01-28 DIAGNOSIS — J9621 Acute and chronic respiratory failure with hypoxia: Secondary | ICD-10-CM | POA: Diagnosis not present

## 2018-02-04 DIAGNOSIS — A419 Sepsis, unspecified organism: Secondary | ICD-10-CM | POA: Diagnosis not present

## 2018-02-04 DIAGNOSIS — J189 Pneumonia, unspecified organism: Secondary | ICD-10-CM | POA: Diagnosis not present

## 2018-02-04 DIAGNOSIS — J9621 Acute and chronic respiratory failure with hypoxia: Secondary | ICD-10-CM | POA: Diagnosis not present

## 2018-02-04 DIAGNOSIS — J440 Chronic obstructive pulmonary disease with acute lower respiratory infection: Secondary | ICD-10-CM | POA: Diagnosis not present

## 2018-02-04 DIAGNOSIS — I130 Hypertensive heart and chronic kidney disease with heart failure and stage 1 through stage 4 chronic kidney disease, or unspecified chronic kidney disease: Secondary | ICD-10-CM | POA: Diagnosis not present

## 2018-02-04 DIAGNOSIS — I2510 Atherosclerotic heart disease of native coronary artery without angina pectoris: Secondary | ICD-10-CM | POA: Diagnosis not present

## 2018-02-10 DIAGNOSIS — E782 Mixed hyperlipidemia: Secondary | ICD-10-CM | POA: Diagnosis not present

## 2018-02-10 DIAGNOSIS — I714 Abdominal aortic aneurysm, without rupture: Secondary | ICD-10-CM | POA: Diagnosis not present

## 2018-02-10 DIAGNOSIS — I739 Peripheral vascular disease, unspecified: Secondary | ICD-10-CM | POA: Diagnosis not present

## 2018-02-10 DIAGNOSIS — I2510 Atherosclerotic heart disease of native coronary artery without angina pectoris: Secondary | ICD-10-CM | POA: Diagnosis not present

## 2018-02-10 DIAGNOSIS — I10 Essential (primary) hypertension: Secondary | ICD-10-CM | POA: Diagnosis not present

## 2018-02-10 DIAGNOSIS — I493 Ventricular premature depolarization: Secondary | ICD-10-CM | POA: Diagnosis not present

## 2018-02-10 DIAGNOSIS — Z8679 Personal history of other diseases of the circulatory system: Secondary | ICD-10-CM | POA: Diagnosis not present

## 2018-02-10 DIAGNOSIS — I454 Nonspecific intraventricular block: Secondary | ICD-10-CM | POA: Diagnosis not present

## 2018-02-10 DIAGNOSIS — I340 Nonrheumatic mitral (valve) insufficiency: Secondary | ICD-10-CM | POA: Diagnosis not present

## 2018-02-10 DIAGNOSIS — I5022 Chronic systolic (congestive) heart failure: Secondary | ICD-10-CM | POA: Diagnosis not present

## 2018-02-10 DIAGNOSIS — I5023 Acute on chronic systolic (congestive) heart failure: Secondary | ICD-10-CM | POA: Diagnosis not present

## 2018-02-10 DIAGNOSIS — I519 Heart disease, unspecified: Secondary | ICD-10-CM | POA: Diagnosis not present

## 2018-02-13 ENCOUNTER — Telehealth: Payer: Self-pay | Admitting: Family Medicine

## 2018-02-13 DIAGNOSIS — J440 Chronic obstructive pulmonary disease with acute lower respiratory infection: Secondary | ICD-10-CM | POA: Diagnosis not present

## 2018-02-13 DIAGNOSIS — J189 Pneumonia, unspecified organism: Secondary | ICD-10-CM | POA: Diagnosis not present

## 2018-02-13 DIAGNOSIS — J9621 Acute and chronic respiratory failure with hypoxia: Secondary | ICD-10-CM | POA: Diagnosis not present

## 2018-02-13 DIAGNOSIS — I2510 Atherosclerotic heart disease of native coronary artery without angina pectoris: Secondary | ICD-10-CM | POA: Diagnosis not present

## 2018-02-13 DIAGNOSIS — A419 Sepsis, unspecified organism: Secondary | ICD-10-CM | POA: Diagnosis not present

## 2018-02-13 DIAGNOSIS — I130 Hypertensive heart and chronic kidney disease with heart failure and stage 1 through stage 4 chronic kidney disease, or unspecified chronic kidney disease: Secondary | ICD-10-CM | POA: Diagnosis not present

## 2018-02-13 NOTE — Telephone Encounter (Signed)
[facility]ip [facility] with [facility] called for a verbal to condinue care for 9 more weeks, one visit every other week for nurse care copd and CHF  Thank s [name]

## 2018-02-13 NOTE — Telephone Encounter (Signed)
ok 

## 2018-02-13 NOTE — Telephone Encounter (Signed)
Please review. Thanks!  

## 2018-02-16 NOTE — Telephone Encounter (Signed)
L/M advising Phillip Allison as below.

## 2018-02-18 DIAGNOSIS — J9611 Chronic respiratory failure with hypoxia: Secondary | ICD-10-CM | POA: Diagnosis not present

## 2018-02-18 DIAGNOSIS — F329 Major depressive disorder, single episode, unspecified: Secondary | ICD-10-CM | POA: Diagnosis not present

## 2018-02-18 DIAGNOSIS — Z9981 Dependence on supplemental oxygen: Secondary | ICD-10-CM | POA: Diagnosis not present

## 2018-02-18 DIAGNOSIS — I509 Heart failure, unspecified: Secondary | ICD-10-CM | POA: Diagnosis not present

## 2018-02-18 DIAGNOSIS — Z950 Presence of cardiac pacemaker: Secondary | ICD-10-CM | POA: Diagnosis not present

## 2018-02-18 DIAGNOSIS — I130 Hypertensive heart and chronic kidney disease with heart failure and stage 1 through stage 4 chronic kidney disease, or unspecified chronic kidney disease: Secondary | ICD-10-CM | POA: Diagnosis not present

## 2018-02-18 DIAGNOSIS — I2510 Atherosclerotic heart disease of native coronary artery without angina pectoris: Secondary | ICD-10-CM | POA: Diagnosis not present

## 2018-02-18 DIAGNOSIS — I252 Old myocardial infarction: Secondary | ICD-10-CM | POA: Diagnosis not present

## 2018-02-18 DIAGNOSIS — Z9989 Dependence on other enabling machines and devices: Secondary | ICD-10-CM | POA: Diagnosis not present

## 2018-02-18 DIAGNOSIS — N183 Chronic kidney disease, stage 3 (moderate): Secondary | ICD-10-CM | POA: Diagnosis not present

## 2018-02-18 DIAGNOSIS — E1122 Type 2 diabetes mellitus with diabetic chronic kidney disease: Secondary | ICD-10-CM | POA: Diagnosis not present

## 2018-02-18 DIAGNOSIS — Z87891 Personal history of nicotine dependence: Secondary | ICD-10-CM | POA: Diagnosis not present

## 2018-02-18 DIAGNOSIS — Z7951 Long term (current) use of inhaled steroids: Secondary | ICD-10-CM | POA: Diagnosis not present

## 2018-02-18 DIAGNOSIS — J440 Chronic obstructive pulmonary disease with acute lower respiratory infection: Secondary | ICD-10-CM | POA: Diagnosis not present

## 2018-02-20 DIAGNOSIS — J440 Chronic obstructive pulmonary disease with acute lower respiratory infection: Secondary | ICD-10-CM | POA: Diagnosis not present

## 2018-02-20 DIAGNOSIS — N183 Chronic kidney disease, stage 3 (moderate): Secondary | ICD-10-CM | POA: Diagnosis not present

## 2018-02-20 DIAGNOSIS — I509 Heart failure, unspecified: Secondary | ICD-10-CM | POA: Diagnosis not present

## 2018-02-20 DIAGNOSIS — I2510 Atherosclerotic heart disease of native coronary artery without angina pectoris: Secondary | ICD-10-CM | POA: Diagnosis not present

## 2018-02-20 DIAGNOSIS — I130 Hypertensive heart and chronic kidney disease with heart failure and stage 1 through stage 4 chronic kidney disease, or unspecified chronic kidney disease: Secondary | ICD-10-CM | POA: Diagnosis not present

## 2018-02-20 DIAGNOSIS — J9611 Chronic respiratory failure with hypoxia: Secondary | ICD-10-CM | POA: Diagnosis not present

## 2018-03-06 DIAGNOSIS — J9611 Chronic respiratory failure with hypoxia: Secondary | ICD-10-CM | POA: Diagnosis not present

## 2018-03-06 DIAGNOSIS — I509 Heart failure, unspecified: Secondary | ICD-10-CM | POA: Diagnosis not present

## 2018-03-06 DIAGNOSIS — I2510 Atherosclerotic heart disease of native coronary artery without angina pectoris: Secondary | ICD-10-CM | POA: Diagnosis not present

## 2018-03-06 DIAGNOSIS — J440 Chronic obstructive pulmonary disease with acute lower respiratory infection: Secondary | ICD-10-CM | POA: Diagnosis not present

## 2018-03-06 DIAGNOSIS — N183 Chronic kidney disease, stage 3 (moderate): Secondary | ICD-10-CM | POA: Diagnosis not present

## 2018-03-06 DIAGNOSIS — I130 Hypertensive heart and chronic kidney disease with heart failure and stage 1 through stage 4 chronic kidney disease, or unspecified chronic kidney disease: Secondary | ICD-10-CM | POA: Diagnosis not present

## 2018-03-14 ENCOUNTER — Telehealth: Payer: Self-pay

## 2018-03-14 NOTE — Telephone Encounter (Signed)
Call pt regarding lung screening. Per pt at this time would like to decline. Pt has been in hospital several times and now the MD are watching him closer he want to decline scan.

## 2018-03-16 DIAGNOSIS — N183 Chronic kidney disease, stage 3 (moderate): Secondary | ICD-10-CM | POA: Diagnosis not present

## 2018-03-16 DIAGNOSIS — I509 Heart failure, unspecified: Secondary | ICD-10-CM | POA: Diagnosis not present

## 2018-03-16 DIAGNOSIS — I2510 Atherosclerotic heart disease of native coronary artery without angina pectoris: Secondary | ICD-10-CM | POA: Diagnosis not present

## 2018-03-16 DIAGNOSIS — J9611 Chronic respiratory failure with hypoxia: Secondary | ICD-10-CM | POA: Diagnosis not present

## 2018-03-16 DIAGNOSIS — J440 Chronic obstructive pulmonary disease with acute lower respiratory infection: Secondary | ICD-10-CM | POA: Diagnosis not present

## 2018-03-16 DIAGNOSIS — I130 Hypertensive heart and chronic kidney disease with heart failure and stage 1 through stage 4 chronic kidney disease, or unspecified chronic kidney disease: Secondary | ICD-10-CM | POA: Diagnosis not present

## 2018-03-19 ENCOUNTER — Telehealth: Payer: Self-pay | Admitting: *Deleted

## 2018-03-19 NOTE — Telephone Encounter (Signed)
Called patient r/t ldct follow up due October 2019, pt reported that he had been in the hospital recently and did not feel like it right now.  Patient instructed on the importance of the scan because there was something suspicious indicated on the last CT.  Patient voiced understanding and stated that he may do it next month.  Patient will be contacted again in November for follow up scan per his request.

## 2018-03-26 ENCOUNTER — Other Ambulatory Visit: Payer: Self-pay | Admitting: Family Medicine

## 2018-03-26 DIAGNOSIS — J449 Chronic obstructive pulmonary disease, unspecified: Secondary | ICD-10-CM

## 2018-04-02 DIAGNOSIS — I2510 Atherosclerotic heart disease of native coronary artery without angina pectoris: Secondary | ICD-10-CM | POA: Diagnosis not present

## 2018-04-02 DIAGNOSIS — J440 Chronic obstructive pulmonary disease with acute lower respiratory infection: Secondary | ICD-10-CM | POA: Diagnosis not present

## 2018-04-02 DIAGNOSIS — N183 Chronic kidney disease, stage 3 (moderate): Secondary | ICD-10-CM | POA: Diagnosis not present

## 2018-04-02 DIAGNOSIS — I130 Hypertensive heart and chronic kidney disease with heart failure and stage 1 through stage 4 chronic kidney disease, or unspecified chronic kidney disease: Secondary | ICD-10-CM | POA: Diagnosis not present

## 2018-04-02 DIAGNOSIS — J9611 Chronic respiratory failure with hypoxia: Secondary | ICD-10-CM | POA: Diagnosis not present

## 2018-04-02 DIAGNOSIS — I509 Heart failure, unspecified: Secondary | ICD-10-CM | POA: Diagnosis not present

## 2018-04-14 ENCOUNTER — Ambulatory Visit (INDEPENDENT_AMBULATORY_CARE_PROVIDER_SITE_OTHER): Payer: Medicare Other | Admitting: Family Medicine

## 2018-04-14 VITALS — BP 116/72 | HR 74 | Temp 98.1°F | Resp 18 | Wt 174.0 lb

## 2018-04-14 DIAGNOSIS — I5022 Chronic systolic (congestive) heart failure: Secondary | ICD-10-CM | POA: Diagnosis not present

## 2018-04-14 DIAGNOSIS — N183 Chronic kidney disease, stage 3 unspecified: Secondary | ICD-10-CM

## 2018-04-14 DIAGNOSIS — J9622 Acute and chronic respiratory failure with hypercapnia: Secondary | ICD-10-CM | POA: Diagnosis not present

## 2018-04-14 DIAGNOSIS — J449 Chronic obstructive pulmonary disease, unspecified: Secondary | ICD-10-CM

## 2018-04-14 DIAGNOSIS — Z23 Encounter for immunization: Secondary | ICD-10-CM | POA: Diagnosis not present

## 2018-04-14 DIAGNOSIS — I255 Ischemic cardiomyopathy: Secondary | ICD-10-CM

## 2018-04-14 DIAGNOSIS — J9621 Acute and chronic respiratory failure with hypoxia: Secondary | ICD-10-CM

## 2018-04-14 NOTE — Progress Notes (Signed)
Phillip Allison.  MRN: [id number] DOB: 09/27/1940  Subjective:  HPI   The patient is a 77 year old male who presents for follow up of his chronic health.  He was last seen on  01/06/18.   Stable breathing issues. Patient Active Problem List   Diagnosis Date Noted  . Sepsis ([place]) 12/15/2017  . Acute on chronic respiratory failure with hypoxia and hypercapnia ([place]) 10/08/2017  . COPD (chronic obstructive pulmonary disease) ([place]) 03/11/2016  . History of tobacco use 03/13/2015  . History of atrial fibrillation 03/13/2015  . Chronic systolic heart failure ([place]) 03/13/2015  . Intraventricular block 03/13/2015  . 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[place]) 03/10/2015  . Cardiovascular disease 03/10/2015  . Arteriosclerosis of coronary artery 03/10/2015  . CAFL (chronic airflow limitation) ([place]) 03/10/2015  . Chronic kidney disease (CKD), stage III (moderate) ([place]) 03/10/2015  . Depression, major, single episode, mild ([place]) 03/10/2015  . 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 Acid reflux 03/10/2015  . Heart & renal disease, hypertensive, with heart failure ([place]) 03/10/2015  . Cardiomyopathy, ischemic 03/10/2015  . 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[place]) 03/10/2015  . Hypercholesterolemia without hypertriglyceridemia 03/10/2015  . Cardiac defibrillator in place 01/25/2015  . MI (mitral incompetence) 11/01/2014  . Benign essential HTN 10/17/2014    Past Medical History:  Diagnosis Date  . CHF (congestive heart failure) ([place])   . COPD (chronic obstructive pulmonary disease) ([place])   . Depression   . Emphysema lung ([place])   . Hypercholesterolemia   . Hypertension   . Myocardial infarction ([place])   . On home oxygen therapy    2 liters at night and as needed  . Pneumonia    in the past  . Presence of permanent cardiac pacemaker   . Shortness of breath dyspnea    with exertion    Social History   Socioeconomic History  . Marital status: Widowed    Spouse name: Not on file  .  Number of children: 3  . Years of education: Not on file  . Highest education level: Associate degree: occupational, Hotel manager, or vocational program  Occupational History  . Occupation: retired  Scientific laboratory technician  . Financial resource strain: Not hard at all  . Food insecurity:    Worry: Never true    Inability: Never true  . Transportation needs:    Medical: No    Non-medical: No  Tobacco Use  . Smoking status: Former Smoker    Packs/day: 0.75    Years: 54.00    Pack years: 40.50    Types: Cigarettes    Last attempt to quit: 2014    Years since quitting: 5.8  . Smokeless tobacco: Never Used  . Tobacco comment: 2014?  Substance and Sexual Activity  . Alcohol use: No  . Drug use: No  . Sexual activity: Not Currently  Lifestyle  . Physical activity:    Days per week: Not on file    Minutes per session: Not on file  . Stress: Only a little  Relationships  . Social connections:    Talks on phone: Not on file    Gets together: Not on file    Attends religious service: Not on file    Active member of club or organization: Not on file    Attends meetings of clubs or organizations: Not on file    Relationship status: Not on file  . Intimate partner violence:    Fear of current or ex partner: Not on file  Emotionally abused: Not on file    Physically abused: Not on file    Forced sexual activity: Not on file  Other Topics Concern  . Not on file  Social History Narrative  . Not on file    Outpatient Encounter Medications as of 04/14/2018  Medication Sig  . albuterol (PROVENTIL) (2.5 MG/3ML) 0.083% nebulizer solution Take 3 mLs (2.5 mg total) by nebulization every 4 (four) hours as needed for wheezing or shortness of breath.  Marland Kitchen amLODipine (NORVASC) 5 MG tablet Take 1 tablet (5 mg total) by mouth daily.  Marland Kitchen aspirin 81 MG tablet Take 81 mg by mouth at bedtime.   . carvedilol (COREG) 12.5 MG tablet Take 12.5 mg by mouth 2 (two) times daily with a meal.   . furosemide (LASIX)  20 MG tablet Take 1 tablet (20 mg total) by mouth 2 (two) times daily.  Marland Kitchen PARoxetine (PAXIL) 20 MG tablet TAKE 1 TABLET(20 MG) BY MOUTH AT BEDTIME  . pravastatin (PRAVACHOL) 40 MG tablet TAKE 1 TABLET BY MOUTH AT BEDTIME  . [DISCONTINUED] albuterol (PROVENTIL) (2.5 MG/3ML) 0.083% nebulizer solution USE 1 VIAL VIA NEBULIZER EVERY 6 HOURS AS NEEDED FOR WHEEZING OR SHORTNESS OF BREATH  . [DISCONTINUED] levofloxacin (LEVAQUIN) 750 MG tablet Take 1 tablet (750 mg total) by mouth every other day. (Patient not taking: Reported on 01/06/2018)   No facility-administered encounter medications on file as of 04/14/2018.     No Known Allergies  Review of Systems  Constitutional: Negative for fever and malaise/fatigue.  HENT: Negative.   Eyes: Negative.   Respiratory: Positive for cough, shortness of breath and wheezing.   Cardiovascular: Negative for chest pain, palpitations and leg swelling.  Gastrointestinal: Negative.   Genitourinary: Negative.   Musculoskeletal: Negative.   Skin: Negative.   Endo/Heme/Allergies: Negative.   Psychiatric/Behavioral: Negative.     Objective:  BP 116/72 (BP Location: Right Arm, Patient Position: Sitting, Cuff Size: Normal)   Pulse 74   Temp 98.1 F (36.7 C) (Oral)   Resp 18   Wt 174 lb (78.9 kg)   SpO2 94%   BMI 24.27 kg/m   Physical Exam  Constitutional: He is oriented to person, place, and time and well-developed, well-nourished, and in no distress.  HENT:  Head: Normocephalic and atraumatic.  Right Ear: External ear normal.  Left Ear: External ear normal.  Nose: Nose normal.  Eyes: Conjunctivae are normal. No scleral icterus.  Neck: No thyromegaly present.  Cardiovascular: Normal rate, regular rhythm and normal heart sounds.  Pulmonary/Chest: Effort normal and breath sounds normal.  Abdominal: Soft.  Musculoskeletal: He exhibits no edema.  Neurological: He is alert and oriented to person, place, and time. Gait normal. GCS score is 15.  Skin:  Skin is warm and dry.  Psychiatric: Mood, memory, affect and judgment normal.    Assessment and Plan :    1. Need for influenza vaccination  - Flu vaccine HIGH DOSE PF (Fluzone High dose)  2. Chronic systolic heart failure (HCC)   3. Cardiomyopathy, ischemic Pt feels well recently. Tired but no other symptoms.  4. Chronic kidney disease (CKD), stage III (moderate) (HCC) Labs on next visit.  5. Chronic obstructive pulmonary disease, unspecified COPD type ([place])   6. Acute on chronic respiratory failure with hypoxia and hypercapnia (HCC) Presently stable. On O2.   I have done the exam and reviewed the chart and it is accurate to the best of my knowledge. Development worker, community has been used and  any errors in dictation  or transcription are unintentional. Miguel Aschoff M.D. [facility]   HPI, Exam and A&P Transcribed under the direction and in the presence of Wilhemena Durie., MD. Electronically Signed: Althea Charon, [place]

## 2018-04-16 ENCOUNTER — Telehealth: Payer: Self-pay | Admitting: *Deleted

## 2018-04-16 DIAGNOSIS — I509 Heart failure, unspecified: Secondary | ICD-10-CM | POA: Diagnosis not present

## 2018-04-16 DIAGNOSIS — J9611 Chronic respiratory failure with hypoxia: Secondary | ICD-10-CM | POA: Diagnosis not present

## 2018-04-16 DIAGNOSIS — N183 Chronic kidney disease, stage 3 (moderate): Secondary | ICD-10-CM | POA: Diagnosis not present

## 2018-04-16 DIAGNOSIS — I2510 Atherosclerotic heart disease of native coronary artery without angina pectoris: Secondary | ICD-10-CM | POA: Diagnosis not present

## 2018-04-16 DIAGNOSIS — J440 Chronic obstructive pulmonary disease with acute lower respiratory infection: Secondary | ICD-10-CM | POA: Diagnosis not present

## 2018-04-16 DIAGNOSIS — I130 Hypertensive heart and chronic kidney disease with heart failure and stage 1 through stage 4 chronic kidney disease, or unspecified chronic kidney disease: Secondary | ICD-10-CM | POA: Diagnosis not present

## 2018-04-16 NOTE — Telephone Encounter (Signed)
Attempted to contact patient r/t LDCT Screening follow up due at this time. Attempted to contact patient r/t LDCT Screening follow up due at this time.  No answer received, unable to leave message at this time, will attempt contact at later date.   

## 2018-04-19 DIAGNOSIS — F329 Major depressive disorder, single episode, unspecified: Secondary | ICD-10-CM | POA: Diagnosis not present

## 2018-04-19 DIAGNOSIS — I130 Hypertensive heart and chronic kidney disease with heart failure and stage 1 through stage 4 chronic kidney disease, or unspecified chronic kidney disease: Secondary | ICD-10-CM | POA: Diagnosis not present

## 2018-04-19 DIAGNOSIS — J9611 Chronic respiratory failure with hypoxia: Secondary | ICD-10-CM | POA: Diagnosis not present

## 2018-04-19 DIAGNOSIS — Z87891 Personal history of nicotine dependence: Secondary | ICD-10-CM | POA: Diagnosis not present

## 2018-04-19 DIAGNOSIS — Z9989 Dependence on other enabling machines and devices: Secondary | ICD-10-CM | POA: Diagnosis not present

## 2018-04-19 DIAGNOSIS — Z950 Presence of cardiac pacemaker: Secondary | ICD-10-CM | POA: Diagnosis not present

## 2018-04-19 DIAGNOSIS — Z7951 Long term (current) use of inhaled steroids: Secondary | ICD-10-CM | POA: Diagnosis not present

## 2018-04-19 DIAGNOSIS — N183 Chronic kidney disease, stage 3 (moderate): Secondary | ICD-10-CM | POA: Diagnosis not present

## 2018-04-19 DIAGNOSIS — J449 Chronic obstructive pulmonary disease, unspecified: Secondary | ICD-10-CM | POA: Diagnosis not present

## 2018-04-19 DIAGNOSIS — I509 Heart failure, unspecified: Secondary | ICD-10-CM | POA: Diagnosis not present

## 2018-04-19 DIAGNOSIS — I252 Old myocardial infarction: Secondary | ICD-10-CM | POA: Diagnosis not present

## 2018-04-19 DIAGNOSIS — I2510 Atherosclerotic heart disease of native coronary artery without angina pectoris: Secondary | ICD-10-CM | POA: Diagnosis not present

## 2018-04-19 DIAGNOSIS — E1122 Type 2 diabetes mellitus with diabetic chronic kidney disease: Secondary | ICD-10-CM | POA: Diagnosis not present

## 2018-04-19 DIAGNOSIS — Z9981 Dependence on supplemental oxygen: Secondary | ICD-10-CM | POA: Diagnosis not present

## 2018-04-22 DIAGNOSIS — N183 Chronic kidney disease, stage 3 (moderate): Secondary | ICD-10-CM | POA: Diagnosis not present

## 2018-04-22 DIAGNOSIS — I2510 Atherosclerotic heart disease of native coronary artery without angina pectoris: Secondary | ICD-10-CM | POA: Diagnosis not present

## 2018-04-22 DIAGNOSIS — I509 Heart failure, unspecified: Secondary | ICD-10-CM | POA: Diagnosis not present

## 2018-04-22 DIAGNOSIS — I130 Hypertensive heart and chronic kidney disease with heart failure and stage 1 through stage 4 chronic kidney disease, or unspecified chronic kidney disease: Secondary | ICD-10-CM | POA: Diagnosis not present

## 2018-04-22 DIAGNOSIS — J449 Chronic obstructive pulmonary disease, unspecified: Secondary | ICD-10-CM | POA: Diagnosis not present

## 2018-04-22 DIAGNOSIS — J9611 Chronic respiratory failure with hypoxia: Secondary | ICD-10-CM | POA: Diagnosis not present

## 2018-05-06 ENCOUNTER — Telehealth: Payer: Self-pay | Admitting: *Deleted

## 2018-05-06 NOTE — Telephone Encounter (Signed)
Unable to leave voicemail for patient regarding due lung screening nodule follow up scan which is past due. Left voicemail for son, listed in EMR to have patient or himself call me back.

## 2018-05-11 ENCOUNTER — Telehealth: Payer: Self-pay | Admitting: Family Medicine

## 2018-05-11 NOTE — Telephone Encounter (Signed)
Phillip Allison will touch base with pt on next visit.  Thanks, [employer]

## 2018-05-11 NOTE — Telephone Encounter (Signed)
[facility] w/ [place] - [phone]  Pt declined his nursing visit on last Sat. Varney Biles will touch base with him on ne

## 2018-05-11 NOTE — Telephone Encounter (Signed)
Please review. Thanks!  

## 2018-05-11 NOTE — Telephone Encounter (Signed)
ok 

## 2018-05-15 ENCOUNTER — Ambulatory Visit: Payer: Self-pay

## 2018-05-15 DIAGNOSIS — N183 Chronic kidney disease, stage 3 unspecified: Secondary | ICD-10-CM

## 2018-05-15 DIAGNOSIS — I255 Ischemic cardiomyopathy: Secondary | ICD-10-CM

## 2018-05-15 DIAGNOSIS — J449 Chronic obstructive pulmonary disease, unspecified: Secondary | ICD-10-CM

## 2018-05-15 DIAGNOSIS — I5022 Chronic systolic (congestive) heart failure: Secondary | ICD-10-CM

## 2018-05-15 NOTE — Chronic Care Management (AMB) (Signed)
  Chronic Care Management   Telephone Outreach Note  05/15/2018 Name: Phillip Allison. MRN: [id number] DOB: 05/03/1941  Referred by: Health Plan Reason for referral : Chronic Care Management (introduction of services)    I reached out to Phillip Allison. today by phone in response to a referral sent by Mr. KHYRIE MASI Jr.'s health plan. Information about CCM services and an offer to enroll Phillip Allison. in the CCM program were provided. Phillip Allison. declined services at this time related to being followed by a home care nurse and has not been enrolled in the CCM program. He states he will strongly consider enrollment once he has been discharged from receiving home nursing services.  Plan:  Mr. Ceferino Lang. has been provided with the care management team contact information should he be interested in Chronic Care Management Services in the future.   Phillip Allison., MD has been notified of this outreach and Mr. SIAN JOLES Jr.'s decision and plan.   Phillip Hornik E. Rollene Rotunda, RN, BSN Nurse Care Coordinator [facility] Care [facility] [phone]

## 2018-05-18 DIAGNOSIS — I509 Heart failure, unspecified: Secondary | ICD-10-CM | POA: Diagnosis not present

## 2018-05-18 DIAGNOSIS — J449 Chronic obstructive pulmonary disease, unspecified: Secondary | ICD-10-CM | POA: Diagnosis not present

## 2018-05-18 DIAGNOSIS — I130 Hypertensive heart and chronic kidney disease with heart failure and stage 1 through stage 4 chronic kidney disease, or unspecified chronic kidney disease: Secondary | ICD-10-CM | POA: Diagnosis not present

## 2018-05-18 DIAGNOSIS — J9611 Chronic respiratory failure with hypoxia: Secondary | ICD-10-CM | POA: Diagnosis not present

## 2018-05-18 DIAGNOSIS — N183 Chronic kidney disease, stage 3 (moderate): Secondary | ICD-10-CM | POA: Diagnosis not present

## 2018-05-18 DIAGNOSIS — I2510 Atherosclerotic heart disease of native coronary artery without angina pectoris: Secondary | ICD-10-CM | POA: Diagnosis not present

## 2018-05-19 DIAGNOSIS — I714 Abdominal aortic aneurysm, without rupture: Secondary | ICD-10-CM | POA: Diagnosis not present

## 2018-05-19 DIAGNOSIS — I5022 Chronic systolic (congestive) heart failure: Secondary | ICD-10-CM | POA: Diagnosis not present

## 2018-05-19 DIAGNOSIS — I5023 Acute on chronic systolic (congestive) heart failure: Secondary | ICD-10-CM | POA: Diagnosis not present

## 2018-05-19 DIAGNOSIS — I10 Essential (primary) hypertension: Secondary | ICD-10-CM | POA: Diagnosis not present

## 2018-05-19 DIAGNOSIS — I454 Nonspecific intraventricular block: Secondary | ICD-10-CM | POA: Diagnosis not present

## 2018-05-19 DIAGNOSIS — I2510 Atherosclerotic heart disease of native coronary artery without angina pectoris: Secondary | ICD-10-CM | POA: Diagnosis not present

## 2018-06-05 ENCOUNTER — Telehealth: Payer: Self-pay | Admitting: *Deleted

## 2018-06-05 DIAGNOSIS — N183 Chronic kidney disease, stage 3 (moderate): Secondary | ICD-10-CM | POA: Diagnosis not present

## 2018-06-05 DIAGNOSIS — I2510 Atherosclerotic heart disease of native coronary artery without angina pectoris: Secondary | ICD-10-CM | POA: Diagnosis not present

## 2018-06-05 DIAGNOSIS — Z122 Encounter for screening for malignant neoplasm of respiratory organs: Secondary | ICD-10-CM

## 2018-06-05 DIAGNOSIS — Z87891 Personal history of nicotine dependence: Secondary | ICD-10-CM

## 2018-06-05 DIAGNOSIS — J449 Chronic obstructive pulmonary disease, unspecified: Secondary | ICD-10-CM | POA: Diagnosis not present

## 2018-06-05 DIAGNOSIS — I509 Heart failure, unspecified: Secondary | ICD-10-CM | POA: Diagnosis not present

## 2018-06-05 DIAGNOSIS — R918 Other nonspecific abnormal finding of lung field: Secondary | ICD-10-CM

## 2018-06-05 DIAGNOSIS — I130 Hypertensive heart and chronic kidney disease with heart failure and stage 1 through stage 4 chronic kidney disease, or unspecified chronic kidney disease: Secondary | ICD-10-CM | POA: Diagnosis not present

## 2018-06-05 DIAGNOSIS — J9611 Chronic respiratory failure with hypoxia: Secondary | ICD-10-CM | POA: Diagnosis not present

## 2018-06-05 NOTE — Telephone Encounter (Signed)
Spoke with patient and son regarding lcs nodule follow up which they request be done 06/11/18 at 1pm. Will attempt to schedule for this time.

## 2018-06-11 ENCOUNTER — Other Ambulatory Visit: Payer: Self-pay | Admitting: Family Medicine

## 2018-06-11 ENCOUNTER — Ambulatory Visit
Admission: RE | Admit: 2018-06-11 | Discharge: 2018-06-11 | Disposition: A | Discharge status: Home / Self Care | Payer: Medicare Other | Source: Ambulatory Visit | Attending: Nurse Practitioner | Admitting: Nurse Practitioner

## 2018-06-11 DIAGNOSIS — Z122 Encounter for screening for malignant neoplasm of respiratory organs: Secondary | ICD-10-CM | POA: Insufficient documentation

## 2018-06-11 DIAGNOSIS — Z87891 Personal history of nicotine dependence: Secondary | ICD-10-CM | POA: Insufficient documentation

## 2018-06-11 DIAGNOSIS — R918 Other nonspecific abnormal finding of lung field: Secondary | ICD-10-CM | POA: Insufficient documentation

## 2018-06-11 DIAGNOSIS — J181 Lobar pneumonia, unspecified organism: Secondary | ICD-10-CM | POA: Diagnosis not present

## 2018-06-15 ENCOUNTER — Telehealth: Payer: Self-pay | Admitting: *Deleted

## 2018-06-15 NOTE — Telephone Encounter (Signed)
Notified patient of LDCT lung cancer screening program results with recommendation for 12 month follow up imaging. Also notified of incidental findings noted below and is encouraged to discuss further with PCP who will receive a copy of this note and/or the CT report. Patient verbalizes understanding.   IMPRESSION: 1. Marked improvement in peribronchovascular nodular consolidation in the left lower lobe, with residual scarring. Favor nodular scarring in the apical segment right upper lobe. Lung-RADS 2, benign appearance or behavior. Continue annual screening with low-dose chest CT without contrast in 12 months. 2. 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 While this may represent a pseudocyst if there is a history of pancreatitis, a cystic pancreatic neoplasm can not be excluded. Follow-up CT abdomen without and with contrast in 2 years is recommended in further evaluation, as clinically indicated. This recommendation follows ACR consensus guidelines: Management of Incidental Pancreatic Cysts: A White Paper of the ACR Incidental Findings Committee. J Am Coll Radiol 4098;11:914-782. 3. Left renal stone. 4. Aortic atherosclerosis (ICD10-170.0). Coronary artery calcification. 5. Distal descending thoracic aortic aneurysm. 6.  Aortic atherosclerosis (ICD10-170.0).

## 2018-06-16 DIAGNOSIS — I2510 Atherosclerotic heart disease of native coronary artery without angina pectoris: Secondary | ICD-10-CM | POA: Diagnosis not present

## 2018-06-16 DIAGNOSIS — I509 Heart failure, unspecified: Secondary | ICD-10-CM | POA: Diagnosis not present

## 2018-06-16 DIAGNOSIS — I130 Hypertensive heart and chronic kidney disease with heart failure and stage 1 through stage 4 chronic kidney disease, or unspecified chronic kidney disease: Secondary | ICD-10-CM | POA: Diagnosis not present

## 2018-06-16 DIAGNOSIS — J449 Chronic obstructive pulmonary disease, unspecified: Secondary | ICD-10-CM | POA: Diagnosis not present

## 2018-06-16 DIAGNOSIS — J9611 Chronic respiratory failure with hypoxia: Secondary | ICD-10-CM | POA: Diagnosis not present

## 2018-06-16 DIAGNOSIS — N183 Chronic kidney disease, stage 3 (moderate): Secondary | ICD-10-CM | POA: Diagnosis not present

## 2018-06-18 DIAGNOSIS — I509 Heart failure, unspecified: Secondary | ICD-10-CM | POA: Diagnosis not present

## 2018-06-18 DIAGNOSIS — Z7951 Long term (current) use of inhaled steroids: Secondary | ICD-10-CM | POA: Diagnosis not present

## 2018-06-18 DIAGNOSIS — I130 Hypertensive heart and chronic kidney disease with heart failure and stage 1 through stage 4 chronic kidney disease, or unspecified chronic kidney disease: Secondary | ICD-10-CM | POA: Diagnosis not present

## 2018-06-18 DIAGNOSIS — F329 Major depressive disorder, single episode, unspecified: Secondary | ICD-10-CM | POA: Diagnosis not present

## 2018-06-18 DIAGNOSIS — I252 Old myocardial infarction: Secondary | ICD-10-CM | POA: Diagnosis not present

## 2018-06-18 DIAGNOSIS — I2510 Atherosclerotic heart disease of native coronary artery without angina pectoris: Secondary | ICD-10-CM | POA: Diagnosis not present

## 2018-06-18 DIAGNOSIS — N183 Chronic kidney disease, stage 3 (moderate): Secondary | ICD-10-CM | POA: Diagnosis not present

## 2018-06-18 DIAGNOSIS — J449 Chronic obstructive pulmonary disease, unspecified: Secondary | ICD-10-CM | POA: Diagnosis not present

## 2018-06-18 DIAGNOSIS — Z9989 Dependence on other enabling machines and devices: Secondary | ICD-10-CM | POA: Diagnosis not present

## 2018-06-18 DIAGNOSIS — J9611 Chronic respiratory failure with hypoxia: Secondary | ICD-10-CM | POA: Diagnosis not present

## 2018-06-18 DIAGNOSIS — Z87891 Personal history of nicotine dependence: Secondary | ICD-10-CM | POA: Diagnosis not present

## 2018-06-18 DIAGNOSIS — E1122 Type 2 diabetes mellitus with diabetic chronic kidney disease: Secondary | ICD-10-CM | POA: Diagnosis not present

## 2018-06-18 DIAGNOSIS — Z9981 Dependence on supplemental oxygen: Secondary | ICD-10-CM | POA: Diagnosis not present

## 2018-06-18 DIAGNOSIS — Z950 Presence of cardiac pacemaker: Secondary | ICD-10-CM | POA: Diagnosis not present

## 2018-07-03 DIAGNOSIS — N183 Chronic kidney disease, stage 3 (moderate): Secondary | ICD-10-CM | POA: Diagnosis not present

## 2018-07-03 DIAGNOSIS — J9611 Chronic respiratory failure with hypoxia: Secondary | ICD-10-CM | POA: Diagnosis not present

## 2018-07-03 DIAGNOSIS — J449 Chronic obstructive pulmonary disease, unspecified: Secondary | ICD-10-CM | POA: Diagnosis not present

## 2018-07-03 DIAGNOSIS — I509 Heart failure, unspecified: Secondary | ICD-10-CM | POA: Diagnosis not present

## 2018-07-03 DIAGNOSIS — I2510 Atherosclerotic heart disease of native coronary artery without angina pectoris: Secondary | ICD-10-CM | POA: Diagnosis not present

## 2018-07-03 DIAGNOSIS — I130 Hypertensive heart and chronic kidney disease with heart failure and stage 1 through stage 4 chronic kidney disease, or unspecified chronic kidney disease: Secondary | ICD-10-CM | POA: Diagnosis not present

## 2018-07-14 ENCOUNTER — Other Ambulatory Visit: Payer: Self-pay | Admitting: Family Medicine

## 2018-07-14 DIAGNOSIS — G4700 Insomnia, unspecified: Secondary | ICD-10-CM

## 2018-07-14 DIAGNOSIS — F419 Anxiety disorder, unspecified: Secondary | ICD-10-CM

## 2018-07-16 DIAGNOSIS — N183 Chronic kidney disease, stage 3 (moderate): Secondary | ICD-10-CM | POA: Diagnosis not present

## 2018-07-16 DIAGNOSIS — J9611 Chronic respiratory failure with hypoxia: Secondary | ICD-10-CM | POA: Diagnosis not present

## 2018-07-16 DIAGNOSIS — J449 Chronic obstructive pulmonary disease, unspecified: Secondary | ICD-10-CM | POA: Diagnosis not present

## 2018-07-16 DIAGNOSIS — I2510 Atherosclerotic heart disease of native coronary artery without angina pectoris: Secondary | ICD-10-CM | POA: Diagnosis not present

## 2018-07-16 DIAGNOSIS — I130 Hypertensive heart and chronic kidney disease with heart failure and stage 1 through stage 4 chronic kidney disease, or unspecified chronic kidney disease: Secondary | ICD-10-CM | POA: Diagnosis not present

## 2018-07-16 DIAGNOSIS — I509 Heart failure, unspecified: Secondary | ICD-10-CM | POA: Diagnosis not present

## 2018-07-21 NOTE — Telephone Encounter (Signed)
Advised son and he reports that the patient would like to change to a different medication instead of going off completely.

## 2018-07-21 NOTE — Telephone Encounter (Signed)
Please review. Thanks!  

## 2018-07-21 NOTE — Telephone Encounter (Signed)
Pt's son called checking on Rx refill.  Pt is out.   Thanks, [employer]

## 2018-07-21 NOTE — Telephone Encounter (Signed)
This is no longer covered on insurance.  Would patient like to stop this medication and see how the next couple of months ago with history of depression or would he like to switch to a similar medication on the formulary

## 2018-07-22 MED ORDER — SERTRALINE HCL 50 MG PO TABS: 50.0000 mg | ORAL_TABLET | Freq: Every day | ORAL | 11 refills | Status: DC

## 2018-07-31 DIAGNOSIS — N183 Chronic kidney disease, stage 3 (moderate): Secondary | ICD-10-CM | POA: Diagnosis not present

## 2018-07-31 DIAGNOSIS — J9611 Chronic respiratory failure with hypoxia: Secondary | ICD-10-CM | POA: Diagnosis not present

## 2018-07-31 DIAGNOSIS — I2510 Atherosclerotic heart disease of native coronary artery without angina pectoris: Secondary | ICD-10-CM | POA: Diagnosis not present

## 2018-07-31 DIAGNOSIS — I509 Heart failure, unspecified: Secondary | ICD-10-CM | POA: Diagnosis not present

## 2018-07-31 DIAGNOSIS — J449 Chronic obstructive pulmonary disease, unspecified: Secondary | ICD-10-CM | POA: Diagnosis not present

## 2018-07-31 DIAGNOSIS — I130 Hypertensive heart and chronic kidney disease with heart failure and stage 1 through stage 4 chronic kidney disease, or unspecified chronic kidney disease: Secondary | ICD-10-CM | POA: Diagnosis not present

## 2018-08-14 DIAGNOSIS — J449 Chronic obstructive pulmonary disease, unspecified: Secondary | ICD-10-CM | POA: Diagnosis not present

## 2018-08-14 DIAGNOSIS — I2510 Atherosclerotic heart disease of native coronary artery without angina pectoris: Secondary | ICD-10-CM | POA: Diagnosis not present

## 2018-08-14 DIAGNOSIS — N183 Chronic kidney disease, stage 3 (moderate): Secondary | ICD-10-CM | POA: Diagnosis not present

## 2018-08-14 DIAGNOSIS — J9611 Chronic respiratory failure with hypoxia: Secondary | ICD-10-CM | POA: Diagnosis not present

## 2018-08-14 DIAGNOSIS — I509 Heart failure, unspecified: Secondary | ICD-10-CM | POA: Diagnosis not present

## 2018-08-14 DIAGNOSIS — I130 Hypertensive heart and chronic kidney disease with heart failure and stage 1 through stage 4 chronic kidney disease, or unspecified chronic kidney disease: Secondary | ICD-10-CM | POA: Diagnosis not present

## 2018-08-21 ENCOUNTER — Other Ambulatory Visit: Payer: Self-pay | Admitting: Family Medicine

## 2018-08-21 NOTE — Telephone Encounter (Signed)
[facility] faxed refill request for the following medications:  albuterol (PROVENTIL) (2.5 MG/3ML) 0.083% nebulizer solution   Please advise.

## 2018-08-24 MED ORDER — ALBUTEROL SULFATE (2.5 MG/3ML) 0.083% IN NEBU: INHALATION_SOLUTION | RESPIRATORY_TRACT | 3 refills | Status: DC

## 2018-09-01 ENCOUNTER — Ambulatory Visit (INDEPENDENT_AMBULATORY_CARE_PROVIDER_SITE_OTHER): Payer: Medicare Other

## 2018-09-01 VITALS — BP 134/56 | HR 68 | Temp 97.9°F | Ht 71.0 in | Wt 177.2 lb

## 2018-09-01 DIAGNOSIS — Z0000 Encounter for general adult medical examination without abnormal findings: Secondary | ICD-10-CM | POA: Diagnosis not present

## 2018-09-01 NOTE — Patient Instructions (Addendum)
[name] , Thank you for taking time to come for your Medicare Wellness Visit. I appreciate your ongoing commitment to your health goals. Please review the following plan we discussed and let me know if I can assist you in the future.   Screening recommendations/referrals: Colonoscopy: No longer required.  Recommended yearly ophthalmology/optometry visit for glaucoma screening and checkup Recommended yearly dental visit for hygiene and checkup  Vaccinations: Influenza vaccine: Up to date Pneumococcal vaccine: Completed series Tdap vaccine: Pt declines today.  Shingles vaccine: Pt declines today.     Advanced directives: Please bring a copy of your POA (Power of Attorney) and/or Living Will to your next appointment.   Conditions/risks identified: Continue to work to increase on water intake.  Next appointment: 10/20/18 with Dr Rosanna Randy.   Preventive Care 2 Years and Older, Male Preventive care refers to lifestyle choices and visits with your health care provider that can promote health and wellness. What does preventive care include?  A yearly physical exam. This is also called an annual well check.  Dental exams once or twice a year.  Routine eye exams. Ask your health care provider how often you should have your eyes checked.  Personal lifestyle choices, including:  Daily care of your teeth and gums.  Regular physical activity.  Eating a healthy diet.  Avoiding tobacco and drug use.  Limiting alcohol use.  Practicing safe sex.  Taking low doses of aspirin every day.  Taking vitamin and mineral supplements as recommended by your health care provider. What happens during an annual well check? The services and screenings done by your health care provider during your annual well check will depend on your age, overall health, lifestyle risk factors, and family history of disease. Counseling  Your health care provider may ask you questions about your:  Alcohol  use.  Tobacco use.  Drug use.  Emotional well-being.  Home and relationship well-being.  Sexual activity.  Eating habits.  History of falls.  Memory and ability to understand (cognition).  Work and work Statistician. Screening  You may have the following tests or measurements:  Height, weight, and BMI.  Blood pressure.  Lipid and cholesterol levels. These may be checked every 5 years, or more frequently if you are over 50 years old.  Skin check.  Lung cancer screening. You may have this screening every year starting at age 31 if you have a 30-pack-year history of smoking and currently smoke or have quit within the past 15 years.  Fecal occult blood test (FOBT) of the stool. You may have this test every year starting at age 68.  Flexible sigmoidoscopy or colonoscopy. You may have a sigmoidoscopy every 5 years or a colonoscopy every 10 years starting at age 71.  Prostate cancer screening. Recommendations will vary depending on your family history and other risks.  Hepatitis C blood test.  Hepatitis B blood test.  Sexually transmitted disease (STD) testing.  Diabetes screening. This is done by checking your blood sugar (glucose) after you have not eaten for a while (fasting). You may have this done every 1-3 years.  Abdominal aortic aneurysm (AAA) screening. You may need this if you are a current or former smoker.  Osteoporosis. You may be screened starting at age 58 if you are at high risk. Talk with your health care provider about your test results, treatment options, and if necessary, the need for more tests. Vaccines  Your health care provider may recommend certain vaccines, such as:  Influenza vaccine. This  is recommended every year.  Tetanus, diphtheria, and acellular pertussis (Tdap, Td) vaccine. You may need a Td booster every 10 years.  Zoster vaccine. You may need this after age 43.  Pneumococcal 13-valent conjugate (PCV13) vaccine. One dose is  recommended after age 69.  Pneumococcal polysaccharide (PPSV23) vaccine. One dose is recommended after age 58. Talk to your health care provider about which screenings and vaccines you need and how often you need them. This information is not intended to replace advice given to you by your health care provider. Make sure you discuss any questions you have with your health care provider. Document Released: 07/07/2015 Document Revised: 02/28/2016 Document Reviewed: 04/11/2015 Elsevier Interactive Patient Education  2017 [place] Prevention in the Home Falls can cause injuries. They can happen to people of all ages. There are many things you can do to make your home safe and to help prevent falls. What can I do on the outside of my home?  Regularly fix the edges of walkways and driveways and fix any cracks.  Remove anything that might make you trip as you walk through a door, such as a raised step or threshold.  Trim any bushes or trees on the path to your home.  Use bright outdoor lighting.  Clear any walking paths of anything that might make someone trip, such as rocks or tools.  Regularly check to see if handrails are loose or broken. Make sure that both sides of any steps have handrails.  Any raised decks and porches should have guardrails on the edges.  Have any leaves, snow, or ice cleared regularly.  Use sand or salt on walking paths during winter.  Clean up any spills in your garage right away. This includes oil or grease spills. What can I do in the bathroom?  Use night lights.  Install grab bars by the toilet and in the tub and shower. Do not use towel bars as grab bars.  Use non-skid mats or decals in the tub or shower.  If you need to sit down in the shower, use a plastic, non-slip stool.  Keep the floor dry. Clean up any water that spills on the floor as soon as it happens.  Remove soap buildup in the tub or shower regularly.  Attach bath mats  securely with double-sided non-slip rug tape.  Do not have throw rugs and other things on the floor that can make you trip. What can I do in the bedroom?  Use night lights.  Make sure that you have a light by your bed that is easy to reach.  Do not use any sheets or blankets that are too big for your bed. They should not hang down onto the floor.  Have a firm chair that has side arms. You can use this for support while you get dressed.  Do not have throw rugs and other things on the floor that can make you trip. What can I do in the kitchen?  Clean up any spills right away.  Avoid walking on wet floors.  Keep items that you use a lot in easy-to-reach places.  If you need to reach something above you, use a strong step stool that has a grab bar.  Keep electrical cords out of the way.  Do not use floor polish or wax that makes floors slippery. If you must use wax, use non-skid floor wax.  Do not have throw rugs and other things on the floor that can make you  trip. What can I do with my stairs?  Do not leave any items on the stairs.  Make sure that there are handrails on both sides of the stairs and use them. Fix handrails that are broken or loose. Make sure that handrails are as long as the stairways.  Check any carpeting to make sure that it is firmly attached to the stairs. Fix any carpet that is loose or worn.  Avoid having throw rugs at the top or bottom of the stairs. If you do have throw rugs, attach them to the floor with carpet tape.  Make sure that you have a light switch at the top of the stairs and the bottom of the stairs. If you do not have them, ask someone to add them for you. What else can I do to help prevent falls?  Wear shoes that:  Do not have high heels.  Have rubber bottoms.  Are comfortable and fit you well.  Are closed at the toe. Do not wear sandals.  If you use a stepladder:  Make sure that it is fully opened. Do not climb a closed  stepladder.  Make sure that both sides of the stepladder are locked into place.  Ask someone to hold it for you, if possible.  Clearly mark and make sure that you can see:  Any grab bars or handrails.  First and last steps.  Where the edge of each step is.  Use tools that help you move around (mobility aids) if they are needed. These include:  Canes.  Walkers.  Scooters.  Crutches.  Turn on the lights when you go into a dark area. Replace any light bulbs as soon as they burn out.  Set up your furniture so you have a clear path. Avoid moving your furniture around.  If any of your floors are uneven, fix them.  If there are any pets around you, be aware of where they are.  Review your medicines with your doctor. Some medicines can make you feel dizzy. This can increase your chance of falling. Ask your doctor what other things that you can do to help prevent falls. This information is not intended to replace advice given to you by your health care provider. Make sure you discuss any questions you have with your health care provider. Document Released: 04/06/2009 Document Revised: 11/16/2015 Document Reviewed: 07/15/2014 Elsevier Interactive Patient Education  2017 [employer].

## 2018-09-01 NOTE — Progress Notes (Addendum)
Subjective:   Phillip Allison. is a 78 y.o. male who presents for Medicare Annual/Subsequent preventive examination.   Review of Systems:  N/A  Cardiac Risk Factors include: advanced age (>82men, >53 women);dyslipidemia;hypertension;male gender     Objective:    Vitals: BP (!) 134/56 (BP Location: Right Arm)   Pulse 68   Temp 97.9 F (36.6 C) (Oral)   Ht 5\' 11"  (1.803 m)   Wt 177 lb 3.2 oz (80.4 kg)   SpO2 93%   BMI 24.71 kg/m   Body mass index is 24.71 kg/m.  Advanced Directives 09/01/2018 12/16/2017 10/08/2017 08/28/2017 04/11/2016 12/18/2015 09/11/2015  Does Patient Have a Medical Advance Directive? Yes No Yes Yes Yes No No  Type of Paramedic of [place];Living will - Healthcare Power of [place];Living will - -  Does patient want to make changes to medical advance directive? - - No - Patient declined - - - -  Copy of [place] in Chart? No - copy requested - No - copy requested No - copy requested No - copy requested - -  Would patient like information on creating a medical advance directive? - No - Patient declined - - - No - patient declined information -    Tobacco Social History   Tobacco Use  Smoking Status Former Smoker  . Packs/day: 0.75  . Years: 54.00  . Pack years: 40.50  . Types: Cigarettes  . Last attempt to quit: 2014  . Years since quitting: 6.1  Smokeless Tobacco Never Used  Tobacco Comment   2014?     Counseling given: Not Answered Comment: 2014?   Clinical Intake:  Pre-visit preparation completed: Yes  Pain : No/denies pain Pain Score: 0-No pain     Nutritional Status: BMI of 19-24  Normal Nutritional Risks: None Diabetes: No  How often do you need to have someone help you when you read instructions, pamphlets, or other written materials from your doctor or pharmacy?: 1 - Never  Interpreter Needed?: No  Information entered  by :: [facility], LPN  Past Medical History:  Diagnosis Date  . CHF (congestive heart failure) ([place])   . COPD (chronic obstructive pulmonary disease) ([place])   . Depression   . Emphysema lung ([place])   . Hypercholesterolemia   . Hypertension   . Myocardial infarction ([place])   . On home oxygen therapy    2 liters at night and as needed  . Pneumonia    in the past  . Presence of permanent cardiac pacemaker   . Shortness of breath dyspnea    with exertion   Past Surgical History:  Procedure Laterality Date  . ABDOMINAL AORTIC ANEURYSM REPAIR    . ANGIOPLASTY     left lower extremity  . CATARACT EXTRACTION W/PHACO Right 12/18/2015   Procedure: CATARACT EXTRACTION PHACO AND INTRAOCULAR LENS PLACEMENT (IOC);  Surgeon: Estill Cotta, MD;  Location: [facility] ORS;  Service: Ophthalmology;  Laterality: Right;  [place] 01:58 AP% 23.5 CDE 50.91 fluid pack lot # [id number]  . INSERT / REPLACE / REMOVE PACEMAKER    . PACEMAKER PLACEMENT    . TONSILLECTOMY     Family History  Problem Relation Age of Onset  . Psoriasis Mother   . Heart attack Father    Social History   Socioeconomic History  . Marital status: Widowed    Spouse name: Not on file  . Number of children: 3  . Years  of education: Not on file  . Highest education level: Associate degree: occupational, Hotel manager, or vocational program  Occupational History  . Occupation: retired  Scientific laboratory technician  . Financial resource strain: Not hard at all  . Food insecurity:    Worry: Never true    Inability: Never true  . Transportation needs:    Medical: No    Non-medical: No  Tobacco Use  . Smoking status: Former Smoker    Packs/day: 0.75    Years: 54.00    Pack years: 40.50    Types: Cigarettes    Last attempt to quit: 2014    Years since quitting: 6.1  . Smokeless tobacco: Never Used  . Tobacco comment: 2014?  Substance and Sexual Activity  . Alcohol use: No  . Drug use: No  . Sexual activity: Not Currently  Lifestyle  .  Physical activity:    Days per week: 0 days    Minutes per session: 0 min  . Stress: Only a little  Relationships  . Social connections:    Talks on phone: Patient refused    Gets together: Patient refused    Attends religious service: Patient refused    Active member of club or organization: Patient refused    Attends meetings of clubs or organizations: Patient refused    Relationship status: Patient refused  Other Topics Concern  . Not on file  Social History Narrative  . Not on file    Outpatient Encounter Medications as of 09/01/2018  Medication Sig  . albuterol (PROVENTIL) (2.5 MG/3ML) 0.083% nebulizer solution USE 1 VIAL VIA NEBULIZER EVERY 6 HOURS AS NEEDED FOR WHEEZING OR SHORTNESS OF BREATH  . amLODipine (NORVASC) 5 MG tablet Take 1 tablet (5 mg total) by mouth daily.  Marland Kitchen aspirin 81 MG tablet Take 81 mg by mouth at bedtime.   . carvedilol (COREG) 12.5 MG tablet Take 12.5 mg by mouth 2 (two) times daily with a meal.   . furosemide (LASIX) 20 MG tablet Take 1 tablet (20 mg total) by mouth 2 (two) times daily.  . pravastatin (PRAVACHOL) 40 MG tablet TAKE 1 TABLET BY MOUTH AT BEDTIME  . sertraline (ZOLOFT) 50 MG tablet Take 1 tablet (50 mg total) by mouth daily.  Marland Kitchen PARoxetine (PAXIL) 20 MG tablet TAKE 1 TABLET(20 MG) BY MOUTH AT BEDTIME (Patient not taking: Reported on 09/01/2018)   No facility-administered encounter medications on file as of 09/01/2018.     Activities of Daily Living In your present state of health, do you have any difficulty performing the following activities: 09/01/2018 12/16/2017  Hearing? Y N  Comment Does not wear hearing aids.  -  Vision? N N  Difficulty concentrating or making decisions? N N  Walking or climbing stairs? Y Y  Comment Avoids steps, due to SOB. -  Dressing or bathing? Y N  Comment Some, due to SOB. -  Doing errands, shopping? Tempie Donning  Preparing Food and eating ? Y -  Using the Toilet? N -  In the past six months, have you accidently  leaked urine? N -  Do you have problems with loss of bowel control? N -  Managing your Medications? Y -  Comment Son manages medications.  -  Managing your Finances? N -  Housekeeping or managing your Housekeeping? Y -  Comment Son does housework.  -  Some recent data might be hidden    Patient Care Team: Jerrol Banana., MD as PCP - General (Family Medicine) Serafina Royals  J, MD as Consulting Physician (Cardiology)   Assessment:   This is a routine wellness examination for [place].  Exercise Activities and Dietary recommendations Current Exercise Habits: The patient does not participate in regular exercise at present, Exercise limited by: respiratory conditions(s)  Goals    . DIET - INCREASE WATER INTAKE     Recommend increasing water intake to 4-6 glasses a day.     . Increase water intake     Starting 04/11/16, I will increase my water intake to 3-4 glasses a day (8 oz).       Fall Risk Fall Risk  09/01/2018 08/28/2017 07/01/2017 04/11/2016 03/13/2015  Falls in the past year? 0 No No No No   FALL RISK PREVENTION PERTAINING TO THE HOME: Any stairs in or around the home? Yes  If so, do they handrails? Yes   Home free of loose throw rugs in walkways, pet beds, electrical cords, etc? Yes  Adequate lighting in your home to reduce risk of falls? Yes   ASSISTIVE DEVICES UTILIZED TO PREVENT FALLS:  Life alert? No  Use of a cane, walker or w/c? No  Grab bars in the bathroom? Yes  Shower chair or bench in shower? Yes  Elevated toilet seat or a handicapped toilet? No    TIMED UP AND GO:  Was the test performed? No .    Depression Screen PHQ 2/9 Scores 09/01/2018 08/28/2017 07/01/2017 04/11/2016  PHQ - 2 Score 3 2 2  0  PHQ- 9 Score 11 9 6  -    Cognitive Function:      6CIT Screen 09/01/2018 04/11/2016  What Year? 0 points 0 points  What month? 0 points 0 points  What time? 0 points 0 points  Count back from 20 0 points 0 points  Months in reverse 0 points 0  points  Repeat phrase 2 points 0 points  Total Score 2 0    Immunization History  Administered Date(s) Administered  . Influenza Split 04/19/2006, 04/23/2012  . Influenza, High Dose Seasonal PF 03/14/2014, 03/13/2015, 03/11/2016, 04/14/2018  . Influenza,inj,Quad PF,6+ Mos 03/27/2013  . Influenza-Unspecified 02/22/2014, 04/01/2017  . Pneumococcal Conjugate-13 03/13/2015  . Pneumococcal Polysaccharide-23 04/18/2005, 04/23/2012  . Td 09/15/2003    Qualifies for Shingles Vaccine? Yes . Due for Shingrix. Education has been provided regarding the importance of this vaccine. Pt has been advised to call insurance company to determine out of pocket expense. Advised may also receive vaccine at local pharmacy or Health Dept. Verbalized acceptance and understanding.  Tdap: Although this vaccine is not a covered service during a Wellness Exam, does the patient still wish to receive this vaccine today?  No .  Education has been provided regarding the importance of this vaccine. Advised may receive this vaccine at local pharmacy or Health Dept. Aware to provide a copy of the vaccination record if obtained from local pharmacy or Health Dept. Verbalized acceptance and understanding.  Flu Vaccine: Up to date  Pneumococcal Vaccine: Up to date  Screening Tests Health Maintenance  Topic Date Due  . TETANUS/TDAP  09/14/2013  . INFLUENZA VACCINE  Completed  . PNA vac Low Risk Adult  Completed   Cancer Screenings:  Colorectal Screening: No longer required.   Lung Cancer Screening: (Low Dose CT Chest recommended if Age 58-80 years, 30 pack-year currently smoking OR have quit w/in 15years.) does not qualify.    Additional Screening:  Vision Screening: Recommended annual ophthalmology exams for early detection of glaucoma and other disorders of the eye.  Dental Screening: Recommended annual dental exams for proper oral hygiene  Community Resource Referral:  CRR required this visit?  No         Plan:  I have personally reviewed and addressed the Medicare Annual Wellness questionnaire and have noted the following in the patient's chart:  A. Medical and social history B. Use of alcohol, tobacco or illicit drugs  C. Current medications and supplements D. Functional ability and status E.  Nutritional status F.  Physical activity G. Advance directives H. List of other physicians I.  Hospitalizations, surgeries, and ER visits in previous 12 months J.  [place] such as hearing and vision if needed, cognitive and depression L. Referrals and appointments - none  In addition, I have reviewed and discussed with patient certain preventive protocols, quality metrics, and best practice recommendations. A written personalized care plan for preventive services as well as general preventive health recommendations were provided to patient.  See attached scanned questionnaire for additional information.   Signed,  Fabio Neighbors, LPN Nurse Health Advisor   Nurse Recommendations: Pt declined the tetanus vaccine today.

## 2018-09-03 ENCOUNTER — Telehealth: Payer: Self-pay | Admitting: Family Medicine

## 2018-09-03 NOTE — Telephone Encounter (Signed)
ok 

## 2018-09-03 NOTE — Telephone Encounter (Signed)
Is it okay to refer to [facility] [place]?  Please advise.  Thanks,   -Mickel Baas

## 2018-09-03 NOTE — Telephone Encounter (Signed)
Pt's son called asking for Dr. Rosanna Randy to get the [facility] from [facility] to start coming back out to the home.  Pt is having some chest congestion and cough.  They usually come out there and do breathing treatments and vitals.  Pt was just in on Monday and seen [facility].  He was good then but this has just started   Pt's son CB is [phone]  Thanks teri

## 2018-09-07 NOTE — Telephone Encounter (Signed)
Order written, awaiting signature. Will call and see if verbal order will be accepted until signature obtained.  ED

## 2018-09-09 NOTE — Telephone Encounter (Signed)
I have advised the son that I called [facility] on Friday and faxed an order to them on Monday.  When talking top them they said that they had instructed patient/family on his nebulizing treatment and that was what they had gone out there to do.    When the son came to the office today to check on the status I advised him of what I had done and what [place] said.  I asked him if they were having trouble with the nebulizer and he said no he knew how to do them but that his father was getting worse and he was afraid he was going to end up in the hospital.  I advised him that he probably needed to be seen rather than wait on [place].  He states that he spoke to some =one 3 times telling them that he needed to be seen by either the doctor or have one of those nurses evaluate him.    The message we received did not reflect what he said.  I offered that we would go ahead and see him if he could get him in today and he said it would need to be tomorrow and made an appointment.

## 2018-09-09 NOTE — Telephone Encounter (Signed)
Phillip Allison - son calling to find out status on home health care approval/signature. Pt is getting worse and needing to see someone asap. -home health care people (ladies).  Please call [name] back today at [phone].  Thanks, [employer]

## 2018-09-10 ENCOUNTER — Other Ambulatory Visit: Payer: Self-pay

## 2018-09-10 ENCOUNTER — Ambulatory Visit
Admission: RE | Admit: 2018-09-10 | Discharge: 2018-09-10 | Disposition: A | Discharge status: Home / Self Care | Payer: Medicare Other | Source: Ambulatory Visit | Attending: Family Medicine | Admitting: Family Medicine

## 2018-09-10 ENCOUNTER — Ambulatory Visit
Admission: RE | Admit: 2018-09-10 | Discharge: 2018-09-10 | Disposition: A | Discharge status: Home / Self Care | Payer: Medicare Other | Attending: Family Medicine | Admitting: Family Medicine

## 2018-09-10 ENCOUNTER — Encounter: Payer: Self-pay | Admitting: Family Medicine

## 2018-09-10 ENCOUNTER — Other Ambulatory Visit: Payer: Self-pay | Admitting: Family Medicine

## 2018-09-10 ENCOUNTER — Ambulatory Visit (INDEPENDENT_AMBULATORY_CARE_PROVIDER_SITE_OTHER): Payer: Medicare Other | Admitting: Family Medicine

## 2018-09-10 VITALS — BP 118/58 | HR 74 | Temp 99.0°F | Resp 20

## 2018-09-10 DIAGNOSIS — I255 Ischemic cardiomyopathy: Secondary | ICD-10-CM

## 2018-09-10 DIAGNOSIS — R05 Cough: Secondary | ICD-10-CM

## 2018-09-10 DIAGNOSIS — I5022 Chronic systolic (congestive) heart failure: Secondary | ICD-10-CM

## 2018-09-10 DIAGNOSIS — I130 Hypertensive heart and chronic kidney disease with heart failure and stage 1 through stage 4 chronic kidney disease, or unspecified chronic kidney disease: Secondary | ICD-10-CM

## 2018-09-10 DIAGNOSIS — R059 Cough, unspecified: Secondary | ICD-10-CM

## 2018-09-10 DIAGNOSIS — J449 Chronic obstructive pulmonary disease, unspecified: Secondary | ICD-10-CM | POA: Diagnosis not present

## 2018-09-10 DIAGNOSIS — Z8679 Personal history of other diseases of the circulatory system: Secondary | ICD-10-CM

## 2018-09-10 DIAGNOSIS — R0602 Shortness of breath: Secondary | ICD-10-CM | POA: Diagnosis not present

## 2018-09-10 DIAGNOSIS — Z95810 Presence of automatic (implantable) cardiac defibrillator: Secondary | ICD-10-CM

## 2018-09-10 MED ORDER — PREDNISONE 10 MG (48) PO TBPK: ORAL_TABLET | ORAL | 0 refills | Status: DC

## 2018-09-10 MED ORDER — DOXYCYCLINE HYCLATE 100 MG PO TABS: 100.0000 mg | ORAL_TABLET | Freq: Two times a day (BID) | ORAL | 0 refills | Status: AC

## 2018-09-10 MED ORDER — FUROSEMIDE 20 MG PO TABS: 40.0000 mg | ORAL_TABLET | Freq: Two times a day (BID) | ORAL | 1 refills | Status: DC

## 2018-09-10 NOTE — Progress Notes (Signed)
Patient: Phillip Allison. Male    DOB: [birth year]   78 y.o.   MRN: [id number] Visit Date: 09/10/2018  Today's Provider: Wilhemena Durie, MD   Chief Complaint  Patient presents with  . COPD  . Cough   Subjective:     HPI  Patient comes in today c/o cough, congestion, and shortness of breath. He does have COPD, but he reports that his breathing is more difficult than usual. He reports that his O2 stats at home have averaged around 90s. He has had symptoms that has worsened over the last few weeks.  He has a little orthopnea and PND at night. He denies any chest pain, fevers, travel exposure and any exposure to anyone with coronavirus risk. BP Readings from Last 3 Encounters:  09/10/18 (!) 118/58  09/01/18 (!) 134/56  04/14/18 116/72     No Known Allergies   Current Outpatient Medications:  .  albuterol (PROVENTIL) (2.5 MG/3ML) 0.083% nebulizer solution, USE 1 VIAL VIA NEBULIZER EVERY 6 HOURS AS NEEDED FOR WHEEZING OR SHORTNESS OF BREATH, Disp: 180 mL, Rfl: 3 .  amLODipine (NORVASC) 5 MG tablet, Take 1 tablet (5 mg total) by mouth daily., Disp: 30 tablet, Rfl: 1 .  aspirin 81 MG tablet, Take 81 mg by mouth at bedtime. , Disp: , Rfl:  .  carvedilol (COREG) 12.5 MG tablet, Take 12.5 mg by mouth 2 (two) times daily with a meal. , Disp: , Rfl:  .  furosemide (LASIX) 20 MG tablet, Take 1 tablet (20 mg total) by mouth 2 (two) times daily., Disp: 60 tablet, Rfl: 1 .  PARoxetine (PAXIL) 20 MG tablet, TAKE 1 TABLET(20 MG) BY MOUTH AT BEDTIME (Patient not taking: Reported on 09/01/2018), Disp: 90 tablet, Rfl: 3 .  pravastatin (PRAVACHOL) 40 MG tablet, TAKE 1 TABLET BY MOUTH AT BEDTIME, Disp: 90 tablet, Rfl: 3 .  sertraline (ZOLOFT) 50 MG tablet, Take 1 tablet (50 mg total) by mouth daily., Disp: 30 tablet, Rfl: 11  Review of Systems  Constitutional: Positive for fatigue. Negative for activity change and appetite change.  HENT: Positive for congestion and postnasal drip.    Respiratory: Positive for cough and shortness of breath.   Cardiovascular: Negative for chest pain, palpitations and leg swelling.  Gastrointestinal: Negative.   Endocrine: Negative.   Allergic/Immunologic: Positive for environmental allergies.  Neurological: Positive for headaches. Negative for dizziness and light-headedness.  Hematological: Negative.   Psychiatric/Behavioral: Negative.     Social History   Tobacco Use  . Smoking status: Former Smoker    Packs/day: 0.75    Years: 54.00    Pack years: 40.50    Types: Cigarettes    Last attempt to quit: 2014    Years since quitting: 6.2  . Smokeless tobacco: Never Used  . Tobacco comment: 2014?  Substance Use Topics  . Alcohol use: No      Objective:   BP (!) 118/58 (BP Location: Left Arm, Patient Position: Sitting, Cuff Size: Normal)   Pulse 74   Temp 99 F (37.2 C)   Resp 20   SpO2 93% Comment: with 2L of O2 Vitals:   09/10/18 0914  BP: (!) 118/58  Pulse: 74  Resp: 20  Temp: 99 F (37.2 C)  SpO2: 93%     Physical Exam Vitals signs reviewed.  Constitutional:      Appearance: He is well-developed.  HENT:     Head: Normocephalic and atraumatic.     Right Ear:  External ear normal.     Left Ear: External ear normal.     Nose: Nose normal.  Eyes:     General: No scleral icterus.    Conjunctiva/sclera: Conjunctivae normal.  Neck:     Thyroid: No thyromegaly.  Cardiovascular:     Rate and Rhythm: Normal rate and regular rhythm.     Heart sounds: Normal heart sounds.  Pulmonary:     Effort: Pulmonary effort is normal.     Breath sounds: Normal breath sounds.  Abdominal:     Palpations: Abdomen is soft.  Lymphadenopathy:     Cervical: No cervical adenopathy.  Skin:    General: Skin is warm and dry.  Neurological:     Mental Status: He is alert and oriented to person, place, and time.  Psychiatric:        Behavior: Behavior normal.        Thought Content: Thought content normal.        Judgment:  Judgment normal.         Assessment & Plan    1. Chronic obstructive pulmonary disease, unspecified COPD type ([place]) Treat with doxycycline for a week and a prednisone 10 mg 12-day taper. - Comprehensive metabolic panel  2. Chronic systolic heart failure (HCC) Clinically I think the patient has heart failure.  At this time double Lasix to 40 mg twice daily.  Consider Entresto when patient with EF of 20%.  Close to palliative care/hospice. - Pro b natriuretic peptide (BNP)[facility])  3. Cough  - CBC with Differential/Platelet - DG Chest 2 View; Future - doxycycline (VIBRA-TABS) 100 MG tablet; Take 1 tablet (100 mg total) by mouth 2 (two) times daily for 7 days.  Dispense: 14 tablet; Refill: 0 - predniSONE (STERAPRED UNI-PAK 48 TAB) 10 MG (48) TBPK tablet; Taper as directed.  Dispense: 48 tablet; Refill: 0  4. Heart & renal disease, hypertensive, with heart failure ([place])   5. Cardiomyopathy, ischemic   6. History of atrial fibrillation   7. Cardiac defibrillator in place     I have done the exam and reviewed the above chart and it is accurate to the best of my knowledge. Development worker, community has been used in this note in any air is in the dictation or transcription are unintentional.  Wilhemena Durie, MD  [place]

## 2018-09-11 DIAGNOSIS — Z87891 Personal history of nicotine dependence: Secondary | ICD-10-CM | POA: Diagnosis not present

## 2018-09-11 DIAGNOSIS — I110 Hypertensive heart disease with heart failure: Secondary | ICD-10-CM | POA: Diagnosis not present

## 2018-09-11 DIAGNOSIS — Z95810 Presence of automatic (implantable) cardiac defibrillator: Secondary | ICD-10-CM | POA: Diagnosis not present

## 2018-09-11 DIAGNOSIS — Z8701 Personal history of pneumonia (recurrent): Secondary | ICD-10-CM | POA: Diagnosis not present

## 2018-09-11 DIAGNOSIS — Z9981 Dependence on supplemental oxygen: Secondary | ICD-10-CM | POA: Diagnosis not present

## 2018-09-11 DIAGNOSIS — I5022 Chronic systolic (congestive) heart failure: Secondary | ICD-10-CM | POA: Diagnosis not present

## 2018-09-11 DIAGNOSIS — Z7952 Long term (current) use of systemic steroids: Secondary | ICD-10-CM | POA: Diagnosis not present

## 2018-09-11 DIAGNOSIS — Z7982 Long term (current) use of aspirin: Secondary | ICD-10-CM | POA: Diagnosis not present

## 2018-09-11 DIAGNOSIS — J449 Chronic obstructive pulmonary disease, unspecified: Secondary | ICD-10-CM | POA: Diagnosis not present

## 2018-09-11 LAB — CBC WITH DIFFERENTIAL/PLATELET
BASOS ABS: 0.1 10*3/uL (ref 0.0–0.2)
Basos: 1 %
EOS (ABSOLUTE): 0.3 10*3/uL (ref 0.0–0.4)
Eos: 3 %
HEMATOCRIT: 39.3 % (ref 37.5–51.0)
Hemoglobin: 12.8 g/dL — ABNORMAL LOW (ref 13.0–17.7)
Immature Grans (Abs): 0.1 10*3/uL (ref 0.0–0.1)
Immature Granulocytes: 1 %
LYMPHS ABS: 1.7 10*3/uL (ref 0.7–3.1)
Lymphs: 13 %
MCH: 27.7 pg (ref 26.6–33.0)
MCHC: 32.6 g/dL (ref 31.5–35.7)
MCV: 85 fL (ref 79–97)
MONOS ABS: 1.1 10*3/uL — AB (ref 0.1–0.9)
Monocytes: 8 %
Neutrophils Absolute: 10 10*3/uL — ABNORMAL HIGH (ref 1.4–7.0)
Neutrophils: 74 %
Platelets: 247 10*3/uL (ref 150–450)
RBC: 4.62 x10E6/uL (ref 4.14–5.80)
RDW: 12.1 % (ref 11.6–15.4)
WBC: 13.2 10*3/uL — AB (ref 3.4–10.8)

## 2018-09-11 LAB — COMPREHENSIVE METABOLIC PANEL
ALK PHOS: 85 IU/L (ref 39–117)
ALT: 54 IU/L — ABNORMAL HIGH (ref 0–44)
AST: 58 IU/L — AB (ref 0–40)
Albumin/Globulin Ratio: 1.3 (ref 1.2–2.2)
Albumin: 3.4 g/dL — ABNORMAL LOW (ref 3.7–4.7)
BILIRUBIN TOTAL: 0.3 mg/dL (ref 0.0–1.2)
BUN/Creatinine Ratio: 20 (ref 10–24)
BUN: 38 mg/dL — ABNORMAL HIGH (ref 8–27)
CO2: 28 mmol/L (ref 20–29)
CREATININE: 1.92 mg/dL — AB (ref 0.76–1.27)
Calcium: 9 mg/dL (ref 8.6–10.2)
Chloride: 97 mmol/L (ref 96–106)
GFR calc Af Amer: 38 mL/min/{1.73_m2} — ABNORMAL LOW (ref >59)
GFR calc non Af Amer: 33 mL/min/{1.73_m2} — ABNORMAL LOW (ref >59)
GLOBULIN, TOTAL: 2.7 g/dL (ref 1.5–4.5)
Glucose: 127 mg/dL — ABNORMAL HIGH (ref 65–99)
POTASSIUM: 3.8 mmol/L (ref 3.5–5.2)
SODIUM: 141 mmol/L (ref 134–144)
Total Protein: 6.1 g/dL (ref 6.0–8.5)

## 2018-09-11 LAB — PRO B NATRIURETIC PEPTIDE: NT-Pro BNP: 5918 pg/mL — ABNORMAL HIGH (ref 0–486)

## 2018-09-14 ENCOUNTER — Telehealth: Payer: Self-pay | Admitting: Family Medicine

## 2018-09-14 NOTE — Telephone Encounter (Signed)
Advised it was ok

## 2018-09-14 NOTE — Telephone Encounter (Signed)
Caryl Pina with [facility] called asking for orders for skilled nursing visit  One week one, two week one, and one week seven  CB#  [phone]  Thanks [name]

## 2018-09-15 DIAGNOSIS — J449 Chronic obstructive pulmonary disease, unspecified: Secondary | ICD-10-CM | POA: Diagnosis not present

## 2018-09-15 DIAGNOSIS — I110 Hypertensive heart disease with heart failure: Secondary | ICD-10-CM | POA: Diagnosis not present

## 2018-09-15 DIAGNOSIS — Z8701 Personal history of pneumonia (recurrent): Secondary | ICD-10-CM | POA: Diagnosis not present

## 2018-09-15 DIAGNOSIS — Z95810 Presence of automatic (implantable) cardiac defibrillator: Secondary | ICD-10-CM | POA: Diagnosis not present

## 2018-09-15 DIAGNOSIS — I5022 Chronic systolic (congestive) heart failure: Secondary | ICD-10-CM | POA: Diagnosis not present

## 2018-09-15 DIAGNOSIS — Z87891 Personal history of nicotine dependence: Secondary | ICD-10-CM | POA: Diagnosis not present

## 2018-09-18 DIAGNOSIS — I5022 Chronic systolic (congestive) heart failure: Secondary | ICD-10-CM | POA: Diagnosis not present

## 2018-09-18 DIAGNOSIS — Z8701 Personal history of pneumonia (recurrent): Secondary | ICD-10-CM | POA: Diagnosis not present

## 2018-09-18 DIAGNOSIS — I110 Hypertensive heart disease with heart failure: Secondary | ICD-10-CM | POA: Diagnosis not present

## 2018-09-18 DIAGNOSIS — Z95810 Presence of automatic (implantable) cardiac defibrillator: Secondary | ICD-10-CM | POA: Diagnosis not present

## 2018-09-18 DIAGNOSIS — Z87891 Personal history of nicotine dependence: Secondary | ICD-10-CM | POA: Diagnosis not present

## 2018-09-18 DIAGNOSIS — J449 Chronic obstructive pulmonary disease, unspecified: Secondary | ICD-10-CM | POA: Diagnosis not present

## 2018-09-21 ENCOUNTER — Encounter (INDEPENDENT_AMBULATORY_CARE_PROVIDER_SITE_OTHER): Payer: Medicare Other | Admitting: Family Medicine

## 2018-09-21 DIAGNOSIS — I110 Hypertensive heart disease with heart failure: Secondary | ICD-10-CM | POA: Diagnosis not present

## 2018-09-21 DIAGNOSIS — Z95810 Presence of automatic (implantable) cardiac defibrillator: Secondary | ICD-10-CM | POA: Diagnosis not present

## 2018-09-21 DIAGNOSIS — Z9981 Dependence on supplemental oxygen: Secondary | ICD-10-CM | POA: Diagnosis not present

## 2018-09-21 DIAGNOSIS — I5022 Chronic systolic (congestive) heart failure: Secondary | ICD-10-CM

## 2018-09-21 DIAGNOSIS — Z87891 Personal history of nicotine dependence: Secondary | ICD-10-CM | POA: Diagnosis not present

## 2018-09-21 DIAGNOSIS — Z8701 Personal history of pneumonia (recurrent): Secondary | ICD-10-CM | POA: Diagnosis not present

## 2018-09-23 ENCOUNTER — Other Ambulatory Visit: Payer: Self-pay

## 2018-09-25 DIAGNOSIS — J449 Chronic obstructive pulmonary disease, unspecified: Secondary | ICD-10-CM | POA: Diagnosis not present

## 2018-09-25 DIAGNOSIS — I110 Hypertensive heart disease with heart failure: Secondary | ICD-10-CM | POA: Diagnosis not present

## 2018-09-25 DIAGNOSIS — Z8701 Personal history of pneumonia (recurrent): Secondary | ICD-10-CM | POA: Diagnosis not present

## 2018-09-25 DIAGNOSIS — Z87891 Personal history of nicotine dependence: Secondary | ICD-10-CM | POA: Diagnosis not present

## 2018-09-25 DIAGNOSIS — Z95810 Presence of automatic (implantable) cardiac defibrillator: Secondary | ICD-10-CM | POA: Diagnosis not present

## 2018-09-25 DIAGNOSIS — I5022 Chronic systolic (congestive) heart failure: Secondary | ICD-10-CM | POA: Diagnosis not present

## 2018-10-02 DIAGNOSIS — Z87891 Personal history of nicotine dependence: Secondary | ICD-10-CM | POA: Diagnosis not present

## 2018-10-02 DIAGNOSIS — J449 Chronic obstructive pulmonary disease, unspecified: Secondary | ICD-10-CM | POA: Diagnosis not present

## 2018-10-02 DIAGNOSIS — Z95810 Presence of automatic (implantable) cardiac defibrillator: Secondary | ICD-10-CM | POA: Diagnosis not present

## 2018-10-02 DIAGNOSIS — Z8701 Personal history of pneumonia (recurrent): Secondary | ICD-10-CM | POA: Diagnosis not present

## 2018-10-02 DIAGNOSIS — I5022 Chronic systolic (congestive) heart failure: Secondary | ICD-10-CM | POA: Diagnosis not present

## 2018-10-02 DIAGNOSIS — I110 Hypertensive heart disease with heart failure: Secondary | ICD-10-CM | POA: Diagnosis not present

## 2018-10-05 ENCOUNTER — Telehealth: Payer: Self-pay

## 2018-10-05 NOTE — Telephone Encounter (Signed)
Patient home health nurse Mardene Celeste called and states that son advised her patient is having a lot of bowel movements after every meal. Patient stated to home health nurse that he has seen blood in his stool after bowel movement. The nurse states that patient has not been truthful with the son about what is going on with him and the son is becoming very concerned. Mardene Celeste would like to know if you want her to do blood work or see the patient? Mardene Celeste contact information is [phone].Please advise

## 2018-10-06 NOTE — Telephone Encounter (Signed)
LMOVM for Phillip Allison to cb.

## 2018-10-06 NOTE — Telephone Encounter (Signed)
L/M advising Phillip Allison as below.

## 2018-10-06 NOTE — Telephone Encounter (Signed)
Please ask her to see pt.

## 2018-10-07 DIAGNOSIS — J449 Chronic obstructive pulmonary disease, unspecified: Secondary | ICD-10-CM | POA: Diagnosis not present

## 2018-10-07 DIAGNOSIS — I110 Hypertensive heart disease with heart failure: Secondary | ICD-10-CM | POA: Diagnosis not present

## 2018-10-07 DIAGNOSIS — I5022 Chronic systolic (congestive) heart failure: Secondary | ICD-10-CM | POA: Diagnosis not present

## 2018-10-07 DIAGNOSIS — Z95810 Presence of automatic (implantable) cardiac defibrillator: Secondary | ICD-10-CM | POA: Diagnosis not present

## 2018-10-07 DIAGNOSIS — Z8701 Personal history of pneumonia (recurrent): Secondary | ICD-10-CM | POA: Diagnosis not present

## 2018-10-07 DIAGNOSIS — Z87891 Personal history of nicotine dependence: Secondary | ICD-10-CM | POA: Diagnosis not present

## 2018-10-11 DIAGNOSIS — Z87891 Personal history of nicotine dependence: Secondary | ICD-10-CM | POA: Diagnosis not present

## 2018-10-11 DIAGNOSIS — I110 Hypertensive heart disease with heart failure: Secondary | ICD-10-CM | POA: Diagnosis not present

## 2018-10-11 DIAGNOSIS — Z7952 Long term (current) use of systemic steroids: Secondary | ICD-10-CM | POA: Diagnosis not present

## 2018-10-11 DIAGNOSIS — Z95810 Presence of automatic (implantable) cardiac defibrillator: Secondary | ICD-10-CM | POA: Diagnosis not present

## 2018-10-11 DIAGNOSIS — Z9981 Dependence on supplemental oxygen: Secondary | ICD-10-CM | POA: Diagnosis not present

## 2018-10-11 DIAGNOSIS — I5022 Chronic systolic (congestive) heart failure: Secondary | ICD-10-CM | POA: Diagnosis not present

## 2018-10-11 DIAGNOSIS — Z7982 Long term (current) use of aspirin: Secondary | ICD-10-CM | POA: Diagnosis not present

## 2018-10-11 DIAGNOSIS — J449 Chronic obstructive pulmonary disease, unspecified: Secondary | ICD-10-CM | POA: Diagnosis not present

## 2018-10-11 DIAGNOSIS — Z8701 Personal history of pneumonia (recurrent): Secondary | ICD-10-CM | POA: Diagnosis not present

## 2018-10-12 ENCOUNTER — Other Ambulatory Visit: Payer: Self-pay | Admitting: Family Medicine

## 2018-10-12 MED ORDER — PRAVASTATIN SODIUM 40 MG PO TABS: 40.0000 mg | ORAL_TABLET | Freq: Every day | ORAL | 3 refills | Status: AC

## 2018-10-12 NOTE — Telephone Encounter (Signed)
[facility] faxed refill request for the following medications:  pravastatin (PRAVACHOL) 40 MG tablet 90 day supply  Please advise. Thanks TNP

## 2018-10-12 NOTE — Telephone Encounter (Signed)
Medication was sent into the pharmacy.  

## 2018-10-14 DIAGNOSIS — Z87891 Personal history of nicotine dependence: Secondary | ICD-10-CM | POA: Diagnosis not present

## 2018-10-14 DIAGNOSIS — I110 Hypertensive heart disease with heart failure: Secondary | ICD-10-CM | POA: Diagnosis not present

## 2018-10-14 DIAGNOSIS — J449 Chronic obstructive pulmonary disease, unspecified: Secondary | ICD-10-CM | POA: Diagnosis not present

## 2018-10-14 DIAGNOSIS — Z95810 Presence of automatic (implantable) cardiac defibrillator: Secondary | ICD-10-CM | POA: Diagnosis not present

## 2018-10-14 DIAGNOSIS — Z8701 Personal history of pneumonia (recurrent): Secondary | ICD-10-CM | POA: Diagnosis not present

## 2018-10-14 DIAGNOSIS — I5022 Chronic systolic (congestive) heart failure: Secondary | ICD-10-CM | POA: Diagnosis not present

## 2018-10-20 ENCOUNTER — Ambulatory Visit: Payer: Self-pay | Admitting: Family Medicine

## 2018-10-23 DIAGNOSIS — Z87891 Personal history of nicotine dependence: Secondary | ICD-10-CM | POA: Diagnosis not present

## 2018-10-23 DIAGNOSIS — I5022 Chronic systolic (congestive) heart failure: Secondary | ICD-10-CM | POA: Diagnosis not present

## 2018-10-23 DIAGNOSIS — J449 Chronic obstructive pulmonary disease, unspecified: Secondary | ICD-10-CM | POA: Diagnosis not present

## 2018-10-23 DIAGNOSIS — I110 Hypertensive heart disease with heart failure: Secondary | ICD-10-CM | POA: Diagnosis not present

## 2018-10-23 DIAGNOSIS — Z8701 Personal history of pneumonia (recurrent): Secondary | ICD-10-CM | POA: Diagnosis not present

## 2018-10-23 DIAGNOSIS — Z95810 Presence of automatic (implantable) cardiac defibrillator: Secondary | ICD-10-CM | POA: Diagnosis not present

## 2018-10-27 DIAGNOSIS — I714 Abdominal aortic aneurysm, without rupture: Secondary | ICD-10-CM | POA: Diagnosis not present

## 2018-10-27 DIAGNOSIS — I2510 Atherosclerotic heart disease of native coronary artery without angina pectoris: Secondary | ICD-10-CM | POA: Diagnosis not present

## 2018-10-27 DIAGNOSIS — I10 Essential (primary) hypertension: Secondary | ICD-10-CM | POA: Diagnosis not present

## 2018-10-27 DIAGNOSIS — I5022 Chronic systolic (congestive) heart failure: Secondary | ICD-10-CM | POA: Diagnosis not present

## 2018-10-27 DIAGNOSIS — Z8679 Personal history of other diseases of the circulatory system: Secondary | ICD-10-CM | POA: Diagnosis not present

## 2018-10-29 DIAGNOSIS — Z87891 Personal history of nicotine dependence: Secondary | ICD-10-CM | POA: Diagnosis not present

## 2018-10-29 DIAGNOSIS — Z8701 Personal history of pneumonia (recurrent): Secondary | ICD-10-CM | POA: Diagnosis not present

## 2018-10-29 DIAGNOSIS — I5022 Chronic systolic (congestive) heart failure: Secondary | ICD-10-CM | POA: Diagnosis not present

## 2018-10-29 DIAGNOSIS — Z95810 Presence of automatic (implantable) cardiac defibrillator: Secondary | ICD-10-CM | POA: Diagnosis not present

## 2018-10-29 DIAGNOSIS — J449 Chronic obstructive pulmonary disease, unspecified: Secondary | ICD-10-CM | POA: Diagnosis not present

## 2018-10-29 DIAGNOSIS — I110 Hypertensive heart disease with heart failure: Secondary | ICD-10-CM | POA: Diagnosis not present

## 2018-11-04 DIAGNOSIS — I110 Hypertensive heart disease with heart failure: Secondary | ICD-10-CM | POA: Diagnosis not present

## 2018-11-04 DIAGNOSIS — I5022 Chronic systolic (congestive) heart failure: Secondary | ICD-10-CM | POA: Diagnosis not present

## 2018-11-04 DIAGNOSIS — Z8701 Personal history of pneumonia (recurrent): Secondary | ICD-10-CM | POA: Diagnosis not present

## 2018-11-04 DIAGNOSIS — Z87891 Personal history of nicotine dependence: Secondary | ICD-10-CM | POA: Diagnosis not present

## 2018-11-04 DIAGNOSIS — J449 Chronic obstructive pulmonary disease, unspecified: Secondary | ICD-10-CM | POA: Diagnosis not present

## 2018-11-04 DIAGNOSIS — Z95810 Presence of automatic (implantable) cardiac defibrillator: Secondary | ICD-10-CM | POA: Diagnosis not present

## 2018-12-21 ENCOUNTER — Other Ambulatory Visit: Payer: Self-pay | Admitting: Family Medicine

## 2018-12-21 MED ORDER — ALBUTEROL SULFATE (2.5 MG/3ML) 0.083% IN NEBU: INHALATION_SOLUTION | RESPIRATORY_TRACT | 3 refills | Status: DC

## 2018-12-21 NOTE — Telephone Encounter (Signed)
[facility] faxed refill request for the following medications:  albuterol (PROVENTIL) (2.5 MG/3ML) 0.083% nebulizer solution    Please advise.

## 2018-12-21 NOTE — Telephone Encounter (Signed)
Refilled

## 2019-05-05 DIAGNOSIS — I10 Essential (primary) hypertension: Secondary | ICD-10-CM | POA: Diagnosis not present

## 2019-05-05 DIAGNOSIS — I5022 Chronic systolic (congestive) heart failure: Secondary | ICD-10-CM | POA: Diagnosis not present

## 2019-05-05 DIAGNOSIS — I2510 Atherosclerotic heart disease of native coronary artery without angina pectoris: Secondary | ICD-10-CM | POA: Diagnosis not present

## 2019-05-05 DIAGNOSIS — I493 Ventricular premature depolarization: Secondary | ICD-10-CM | POA: Diagnosis not present

## 2019-05-05 DIAGNOSIS — I739 Peripheral vascular disease, unspecified: Secondary | ICD-10-CM | POA: Diagnosis not present

## 2019-05-07 ENCOUNTER — Emergency Department: Payer: Medicare Other

## 2019-05-07 ENCOUNTER — Other Ambulatory Visit: Payer: Self-pay

## 2019-05-07 ENCOUNTER — Inpatient Hospital Stay
Admission: EM | Admit: 2019-05-07 | Discharge: 2019-05-13 | DRG: 981 | Disposition: A | Discharge status: Home / Self Care | Payer: Medicare Other | Attending: Internal Medicine | Admitting: Internal Medicine

## 2019-05-07 DIAGNOSIS — Z20828 Contact with and (suspected) exposure to other viral communicable diseases: Secondary | ICD-10-CM | POA: Diagnosis not present

## 2019-05-07 DIAGNOSIS — Z8679 Personal history of other diseases of the circulatory system: Secondary | ICD-10-CM

## 2019-05-07 DIAGNOSIS — E7800 Pure hypercholesterolemia, unspecified: Secondary | ICD-10-CM | POA: Diagnosis present

## 2019-05-07 DIAGNOSIS — R079 Chest pain, unspecified: Secondary | ICD-10-CM | POA: Diagnosis not present

## 2019-05-07 DIAGNOSIS — K219 Gastro-esophageal reflux disease without esophagitis: Secondary | ICD-10-CM | POA: Diagnosis present

## 2019-05-07 DIAGNOSIS — Z23 Encounter for immunization: Secondary | ICD-10-CM

## 2019-05-07 DIAGNOSIS — Z66 Do not resuscitate: Secondary | ICD-10-CM | POA: Diagnosis not present

## 2019-05-07 DIAGNOSIS — I472 Ventricular tachycardia: Secondary | ICD-10-CM | POA: Diagnosis not present

## 2019-05-07 DIAGNOSIS — I700 Atherosclerosis of aorta: Secondary | ICD-10-CM | POA: Diagnosis not present

## 2019-05-07 DIAGNOSIS — I482 Chronic atrial fibrillation, unspecified: Secondary | ICD-10-CM | POA: Diagnosis present

## 2019-05-07 DIAGNOSIS — N179 Acute kidney failure, unspecified: Secondary | ICD-10-CM | POA: Diagnosis not present

## 2019-05-07 DIAGNOSIS — R0600 Dyspnea, unspecified: Secondary | ICD-10-CM | POA: Diagnosis not present

## 2019-05-07 DIAGNOSIS — I5022 Chronic systolic (congestive) heart failure: Secondary | ICD-10-CM | POA: Diagnosis present

## 2019-05-07 DIAGNOSIS — R0602 Shortness of breath: Secondary | ICD-10-CM | POA: Diagnosis not present

## 2019-05-07 DIAGNOSIS — I2510 Atherosclerotic heart disease of native coronary artery without angina pectoris: Secondary | ICD-10-CM | POA: Diagnosis present

## 2019-05-07 DIAGNOSIS — I130 Hypertensive heart and chronic kidney disease with heart failure and stage 1 through stage 4 chronic kidney disease, or unspecified chronic kidney disease: Secondary | ICD-10-CM | POA: Diagnosis present

## 2019-05-07 DIAGNOSIS — I252 Old myocardial infarction: Secondary | ICD-10-CM

## 2019-05-07 DIAGNOSIS — J209 Acute bronchitis, unspecified: Principal | ICD-10-CM | POA: Diagnosis present

## 2019-05-07 DIAGNOSIS — I739 Peripheral vascular disease, unspecified: Secondary | ICD-10-CM | POA: Diagnosis present

## 2019-05-07 DIAGNOSIS — Z87891 Personal history of nicotine dependence: Secondary | ICD-10-CM

## 2019-05-07 DIAGNOSIS — R042 Hemoptysis: Secondary | ICD-10-CM | POA: Diagnosis not present

## 2019-05-07 DIAGNOSIS — J9622 Acute and chronic respiratory failure with hypercapnia: Secondary | ICD-10-CM | POA: Diagnosis present

## 2019-05-07 DIAGNOSIS — N183 Chronic kidney disease, stage 3 unspecified: Secondary | ICD-10-CM | POA: Diagnosis present

## 2019-05-07 DIAGNOSIS — J9621 Acute and chronic respiratory failure with hypoxia: Secondary | ICD-10-CM | POA: Diagnosis not present

## 2019-05-07 DIAGNOSIS — R05 Cough: Secondary | ICD-10-CM | POA: Diagnosis not present

## 2019-05-07 DIAGNOSIS — I4892 Unspecified atrial flutter: Secondary | ICD-10-CM | POA: Diagnosis present

## 2019-05-07 DIAGNOSIS — F32A Depression, unspecified: Secondary | ICD-10-CM | POA: Diagnosis present

## 2019-05-07 DIAGNOSIS — I255 Ischemic cardiomyopathy: Secondary | ICD-10-CM | POA: Diagnosis present

## 2019-05-07 DIAGNOSIS — Z79899 Other long term (current) drug therapy: Secondary | ICD-10-CM

## 2019-05-07 DIAGNOSIS — Z9981 Dependence on supplemental oxygen: Secondary | ICD-10-CM

## 2019-05-07 DIAGNOSIS — I10 Essential (primary) hypertension: Secondary | ICD-10-CM | POA: Diagnosis present

## 2019-05-07 DIAGNOSIS — E785 Hyperlipidemia, unspecified: Secondary | ICD-10-CM | POA: Diagnosis present

## 2019-05-07 DIAGNOSIS — Z95810 Presence of automatic (implantable) cardiac defibrillator: Secondary | ICD-10-CM

## 2019-05-07 DIAGNOSIS — I495 Sick sinus syndrome: Secondary | ICD-10-CM | POA: Diagnosis present

## 2019-05-07 DIAGNOSIS — Z8701 Personal history of pneumonia (recurrent): Secondary | ICD-10-CM

## 2019-05-07 DIAGNOSIS — R0789 Other chest pain: Secondary | ICD-10-CM | POA: Diagnosis not present

## 2019-05-07 DIAGNOSIS — I712 Thoracic aortic aneurysm, without rupture: Secondary | ICD-10-CM | POA: Diagnosis present

## 2019-05-07 DIAGNOSIS — R0689 Other abnormalities of breathing: Secondary | ICD-10-CM | POA: Diagnosis not present

## 2019-05-07 DIAGNOSIS — Z7982 Long term (current) use of aspirin: Secondary | ICD-10-CM

## 2019-05-07 DIAGNOSIS — Z8249 Family history of ischemic heart disease and other diseases of the circulatory system: Secondary | ICD-10-CM

## 2019-05-07 DIAGNOSIS — E875 Hyperkalemia: Secondary | ICD-10-CM | POA: Diagnosis not present

## 2019-05-07 DIAGNOSIS — F329 Major depressive disorder, single episode, unspecified: Secondary | ICD-10-CM | POA: Diagnosis present

## 2019-05-07 DIAGNOSIS — J432 Centrilobular emphysema: Secondary | ICD-10-CM | POA: Diagnosis present

## 2019-05-07 LAB — CBC
HCT: 43.6 % (ref 39.0–52.0)
Hemoglobin: 13.8 g/dL (ref 13.0–17.0)
MCH: 28.3 pg (ref 26.0–34.0)
MCHC: 31.7 g/dL (ref 30.0–36.0)
MCV: 89.5 fL (ref 80.0–100.0)
Platelets: 147 10*3/uL — ABNORMAL LOW (ref 150–400)
RBC: 4.87 MIL/uL (ref 4.22–5.81)
RDW: 12.2 % (ref 11.5–15.5)
WBC: 11.1 10*3/uL — ABNORMAL HIGH (ref 4.0–10.5)
nRBC: 0 % (ref 0.0–0.2)

## 2019-05-07 NOTE — ED Provider Notes (Signed)
[facility] Emergency Department Provider Note ______   First MD Initiated Contact with Patient 05/07/19 2338     (approximate)  I have reviewed the triage vital signs and the nursing notes.   HISTORY  Chief Complaint Hemoptysis   HPI Phillip Allison. is a 78 y.o. male with below list of previous medical conditions presents to the emergency department secondary to hemoptysis x1 day.  Patient states "it is just blood that comes out".  Patient denies any fever.  Patient denies any nausea or vomiting.  Patient son states that he heard his father coughing which is not unusual.  He states when his father came from the area where he was in the house he noted that his father was markedly pale and that he just slumped down into the chair.  Patient son stated that he noted based on description approximately 5 to 10 mL of blood noted in the tissue when his father cough.  Patient's son applied a pulse oximeter to his finger and noted that his oxygen level was 83%.  Patient denies any chest pain at present.  Patient does admit to dyspnea.  Patient does admit to continued cough.  Patient denies any fever       Past Medical History:  Diagnosis Date   CHF (congestive heart failure) (HCC)    COPD (chronic obstructive pulmonary disease) (HCC)    Depression    Emphysema lung (HCC)    Hypercholesterolemia    Hypertension    Myocardial infarction ([place])    On home oxygen therapy    2 liters at night and as needed   Pneumonia    in the past   Presence of permanent cardiac pacemaker    Shortness of breath dyspnea    with exertion    Patient Active Problem List   Diagnosis Date Noted   Hemoptysis 05/08/2019   Acute on chronic respiratory failure with hypoxia and hypercapnia (HCC) 10/08/2017   COPD (chronic obstructive pulmonary disease) ([place]) 03/11/2016   History of tobacco use 03/13/2015   History of atrial fibrillation 03/13/2015   Chronic  systolic heart failure ([place]) 03/13/2015   Intraventricular block 03/13/2015   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[place]) 03/10/2015   Cardiovascular disease 03/10/2015   Arteriosclerosis of coronary artery 03/10/2015   CAFL (chronic airflow limitation) ([place]) 03/10/2015   Chronic kidney disease (CKD), stage III (moderate) 03/10/2015   Depression, major, single episode, mild ([place]) 03/10/2015   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   Acid reflux 03/10/2015   Heart & renal disease, hypertensive, with heart failure ([place]) 03/10/2015   Cardiomyopathy, ischemic 03/10/2015   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[place]) 03/10/2015   Hypercholesterolemia without hypertriglyceridemia 03/10/2015   Cardiac defibrillator in place 01/25/2015   MI (mitral incompetence) 11/01/2014   Benign essential HTN 10/17/2014    Past Surgical History:  Procedure Laterality Date   ABDOMINAL AORTIC ANEURYSM REPAIR     ANGIOPLASTY     left lower extremity   CATARACT EXTRACTION W/PHACO Right 12/18/2015   Procedure: CATARACT EXTRACTION PHACO AND INTRAOCULAR LENS PLACEMENT ([place]);  Surgeon: Estill Cotta, MD;  Location: [facility] ORS;  Service: Ophthalmology;  Laterality: Right;  [place] 01:58 AP% 23.5 CDE 50.91 fluid pack lot # [id number]   INSERT / REPLACE / REMOVE PACEMAKER     PACEMAKER PLACEMENT     TONSILLECTOMY      Prior to Admission medications   Medication Sig Start Date End Date Taking? Authorizing Provider  albuterol (PROVENTIL) (2.5 MG/3ML) 0.083%  nebulizer solution USE 1 VIAL VIA NEBULIZER EVERY 6 HOURS AS NEEDED FOR WHEEZING OR SHORTNESS OF BREATH Patient taking differently: Take 2.5 mg by nebulization every 6 (six) hours as needed for wheezing or shortness of breath.  12/21/18  Yes Jerrol Banana., MD  amLODipine (NORVASC) 5 MG tablet Take 1 tablet (5 mg total) by mouth daily. 10/10/17  Yes Fritzi Mandes, MD  aspirin 81 MG tablet Take 81 mg by mouth at bedtime.  08/22/11  Yes [provider]    carvedilol (COREG) 12.5 MG tablet Take 12.5 mg by mouth 2 (two) times daily with a meal.  08/22/11  Yes [provider]  ENTRESTO 24-26 MG Take 1 tablet by mouth 2 (two) times daily. 05/05/19  Yes [provider]  pravastatin (PRAVACHOL) 40 MG tablet Take 1 tablet (40 mg total) by mouth at bedtime. 10/12/18  Yes Jerrol Banana., MD  sertraline (ZOLOFT) 50 MG tablet Take 1 tablet (50 mg total) by mouth daily. 07/22/18  Yes Jerrol Banana., MD    Allergies Patient has no known allergies.  Family History  Problem Relation Age of Onset   Psoriasis Mother    Heart attack Father     Social History Social History   Tobacco Use   Smoking status: Former Smoker    Packs/day: 0.75    Years: 54.00    Pack years: 40.50    Types: Cigarettes    Quit date: 2014    Years since quitting: 6.8   Smokeless tobacco: Never Used   Tobacco comment: 2014?  Substance Use Topics   Alcohol use: No   Drug use: No    Review of Systems Constitutional: No fever/chills Eyes: No visual changes. ENT: No sore throat. Cardiovascular: Denies chest pain. Respiratory: Positive for dyspnea and cough.  Positive for hemoptysis Gastrointestinal: No abdominal pain.  No nausea, no vomiting.  No diarrhea.  No constipation. Genitourinary: Negative for dysuria. Musculoskeletal: Negative for neck pain.  Negative for back pain. Integumentary: Negative for rash. Neurological: Negative for headaches, focal weakness or numbness.  ____________________________________________   PHYSICAL EXAM:  VITAL SIGNS: ED Triage Vitals [05/07/19 2325]  Enc Vitals Group     BP (!) 158/90     Pulse Rate 93     Resp 18     Temp 97.8 F (36.6 C)     Temp Source Oral     SpO2 (!) 83 %     Weight 86.2 kg (190 lb)     Height 1.803 m (5\' 11" )     Head Circumference      Peak Flow      Pain Score 0     Pain Loc      Pain Edu?      Excl. in [place]?     Constitutional: Alert and oriented.   Eyes: Conjunctivae are normal. . Mouth/Throat: Dried blood noted in the patient's beard Neck: No stridor.  No meningeal signs.   Cardiovascular: Normal rate, regular rhythm. Good peripheral circulation. Grossly normal heart sounds. Respiratory: Normal respiratory effort.  No retractions. Gastrointestinal: Soft and nontender. No distention.  Musculoskeletal: No lower extremity tenderness nor edema. No gross deformities of extremities. Neurologic:  Normal speech and language. No gross focal neurologic deficits are appreciated.  Skin:  Skin is warm, dry and intact. Psychiatric: Mood and affect are normal. Speech and behavior are normal.  ____________________________________________   LABS (all labs ordered are listed, but only abnormal results are displayed)  Labs  Reviewed  CBC - Abnormal; Notable for the following components:      Result Value   WBC 11.1 (*)    Platelets 147 (*)    All other components within normal limits  COMPREHENSIVE METABOLIC PANEL - Abnormal; Notable for the following components:   Glucose, Bld 135 (*)    BUN 33 (*)    Creatinine, Ser 1.54 (*)    GFR calc non Af Amer 43 (*)    GFR calc Af Amer 50 (*)    All other components within normal limits  TYPE AND SCREEN  TYPE AND SCREEN   ____________________________________________  EKG  ED ECG REPORT I, [place] Inette Doubrava, the attending physician, personally viewed and interpreted this ECG.   Date: 05/07/2019  EKG Time: 11:30 PM  Rate: 87  Rhythm: Sinus rhythm left ventricular hypertrophy  Axis: Normal  Intervals: Normal  ST&T Change: None ___________________________________  RADIOLOGY I, [place] Hisae Decoursey, personally viewed and evaluated these images (plain radiographs) as part of my medical decision making, as well as reviewing the written report by the radiologist.  ED MD interpretation: Chest x-ray revealed chronic changes without any acute abnormality per radiologist.  CT chest revealed no  evidence of pulmonary emboli aneurysmal dilation of the descending aorta similar to previous exam.  Slightly improved scarring of the right upper lobe when compared to previous exam as well some patchy opacities noted throughout both lungs which are stable compared to previous exam per radiologist.  Official radiology report(s): Ct Angio Chest Pe W And/or Wo Contrast  Result Date: 05/08/2019 CLINICAL DATA:  Hemoptysis and chest pain EXAM: CT ANGIOGRAPHY CHEST WITH CONTRAST TECHNIQUE: Multidetector CT imaging of the chest was performed using the standard protocol during bolus administration of intravenous contrast. Multiplanar CT image reconstructions and MIPs were obtained to evaluate the vascular anatomy. CONTRAST:  67mL OMNIPAQUE IOHEXOL 350 MG/ML SOLN COMPARISON:  Film from earlier in the same day, CT from 06/11/2018 FINDINGS: Cardiovascular: Thoracic aorta demonstrates atherosclerotic calcifications. No aneurysmal dilatation in the ascending aorta is seen although stable descending thoracic aortic dilatation is noted with irregular atherosclerotic plaque. Maximum dilatation of the descending thoracic aorta is stable at 4.5 cm similar to that seen on the prior exam. Pacing device is noted and stable. The pulmonary artery shows a normal branching pattern without intraluminal filling defect to suggest pulmonary embolism. Coronary calcifications are noted. Calcifications of the aortic valve are noted. Mediastinum/Nodes: Thoracic inlet is within normal limits. No hilar or mediastinal adenopathy is noted. The esophagus as visualized is within normal limits. Lungs/Pleura: The lungs are well aerated bilaterally. No focal confluent infiltrate is seen. Patchy scarring is noted in the right upper lobe improved from the prior exam. Mild emphysematous changes are noted. Patchy scarring is noted within the lungs bilaterally. No focal confluent infiltrate is seen. Upper Abdomen: The right kidney is shrunken but stable  from the prior exam. The left kidney is somewhat hypertrophied. The remainder of the upper abdomen is within normal limits. Musculoskeletal: Degenerative changes of the thoracic spine are noted. No acute rib abnormality is noted. No compression deformity is seen. Review of the MIP images confirms the above findings. IMPRESSION: No evidence of pulmonary emboli. 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 Slight improvement of the scarring in the right upper lobe when compared with the prior exam. Some patchy opacities are noted throughout both lungs stable from the previous study. Aortic Atherosclerosis (ICD10-I70.0) and Emphysema (ICD10-J43.9). Electronically Signed  By: Inez Catalina M.D.   On: 05/08/2019 02:39   Dg Chest Portable 1 View  Result Date: 05/07/2019 CLINICAL DATA:  Hemoptysis and shortness of breath EXAM: PORTABLE CHEST 1 VIEW COMPARISON:  09/10/2018 FINDINGS: Cardiac shadow is stable. Defibrillator is again noted and stable. Aortic calcifications are seen. No focal infiltrate or sizable effusion is seen. Mild chronic interstitial changes are again noted and stable. Degenerative changes of the thoracic spine are seen. IMPRESSION: Chronic changes without acute abnormality. Electronically Signed   By: Inez Catalina M.D.   On: 05/07/2019 23:59      Procedures   ____________________________________________   INITIAL IMPRESSION / MDM / ASSESSMENT AND PLAN / ED COURSE  As part of my medical decision making, I reviewed the following data within the electronic MEDICAL RECORD NUMBER  78 year old male presenting with above-stated history and physical exam secondary to hemoptysis and increased oxygen demand.  Patient given a DuoNeb IV Solu-Medrol 60 mg.  Chest x-ray revealed no acute abnormality.  CT chest performed which likewise revealed no acute abnormality.  Concern for bronchitis versus other pulmonary etiology for the patient's hemoptysis and current  hypoxia.  Patient is currently requiring 4 L via nasal cannula to maintain oxygen saturation of 83%.  Patient is on 2 L nasal cannula at home and his son states that his oxygen saturation is usually 97-98%.  ____________________________________________  FINAL CLINICAL IMPRESSION(S) / ED DIAGNOSES  Final diagnoses:  Hemoptysis     MEDICATIONS GIVEN DURING THIS VISIT:  Medications  methylPREDNISolone sodium succinate (SOLU-MEDROL) 125 mg/2 mL injection 60 mg (has no administration in time range)  ipratropium-albuterol (DUONEB) 0.5-2.5 (3) MG/3ML nebulizer solution 3 mL (has no administration in time range)  cefTRIAXone (ROCEPHIN) 1 g in sodium chloride 0.9 % 100 mL IVPB (has no administration in time range)  azithromycin (ZITHROMAX) 500 mg in sodium chloride 0.9 % 250 mL IVPB (has no administration in time range)  iohexol (OMNIPAQUE) 350 MG/ML injection 60 mL (60 mLs Intravenous Contrast Given 05/08/19 0204)     ED Discharge Orders    None      *Please note:  Phillip Allison. was evaluated in Emergency Department on 05/08/2019 for the symptoms described in the history of present illness. He was evaluated in the context of the global COVID-19 pandemic, which necessitated consideration that the patient might be at risk for infection with the SARS-CoV-2 virus that causes COVID-19. Institutional protocols and algorithms that pertain to the evaluation of patients at risk for COVID-19 are in a state of rapid change based on information released by regulatory bodies including the CDC and federal and state organizations. These policies and algorithms were followed during the patient's care in the ED.  Some ED evaluations and interventions may be delayed as a result of limited staffing during the pandemic.*  Note:  This document was prepared using Dragon voice recognition software and may include unintentional dictation errors.   Gregor Hams, MD 05/08/19 [phone]

## 2019-05-07 NOTE — ED Triage Notes (Signed)
Pt arrived via home c/o SOB. hx of chf, copd, pacemaker, mi, stage 3 renal failure. Pt started coughing with blood. Pt is not on blood thinners, no asa or nitro.

## 2019-05-08 ENCOUNTER — Emergency Department: Payer: Medicare Other

## 2019-05-08 ENCOUNTER — Other Ambulatory Visit: Payer: Self-pay

## 2019-05-08 DIAGNOSIS — I482 Chronic atrial fibrillation, unspecified: Secondary | ICD-10-CM | POA: Diagnosis present

## 2019-05-08 DIAGNOSIS — E785 Hyperlipidemia, unspecified: Secondary | ICD-10-CM | POA: Diagnosis present

## 2019-05-08 DIAGNOSIS — Z20828 Contact with and (suspected) exposure to other viral communicable diseases: Secondary | ICD-10-CM | POA: Diagnosis present

## 2019-05-08 DIAGNOSIS — N183 Chronic kidney disease, stage 3 unspecified: Secondary | ICD-10-CM | POA: Diagnosis present

## 2019-05-08 DIAGNOSIS — N179 Acute kidney failure, unspecified: Secondary | ICD-10-CM | POA: Diagnosis not present

## 2019-05-08 DIAGNOSIS — J441 Chronic obstructive pulmonary disease with (acute) exacerbation: Secondary | ICD-10-CM | POA: Diagnosis not present

## 2019-05-08 DIAGNOSIS — F32A Depression, unspecified: Secondary | ICD-10-CM | POA: Diagnosis present

## 2019-05-08 DIAGNOSIS — I10 Essential (primary) hypertension: Secondary | ICD-10-CM

## 2019-05-08 DIAGNOSIS — J432 Centrilobular emphysema: Secondary | ICD-10-CM | POA: Diagnosis present

## 2019-05-08 DIAGNOSIS — Z4502 Encounter for adjustment and management of automatic implantable cardiac defibrillator: Secondary | ICD-10-CM | POA: Diagnosis not present

## 2019-05-08 DIAGNOSIS — F329 Major depressive disorder, single episode, unspecified: Secondary | ICD-10-CM | POA: Diagnosis present

## 2019-05-08 DIAGNOSIS — R001 Bradycardia, unspecified: Secondary | ICD-10-CM | POA: Diagnosis not present

## 2019-05-08 DIAGNOSIS — R079 Chest pain, unspecified: Secondary | ICD-10-CM | POA: Diagnosis not present

## 2019-05-08 DIAGNOSIS — I472 Ventricular tachycardia: Secondary | ICD-10-CM | POA: Diagnosis not present

## 2019-05-08 DIAGNOSIS — E875 Hyperkalemia: Secondary | ICD-10-CM | POA: Diagnosis not present

## 2019-05-08 DIAGNOSIS — R0602 Shortness of breath: Secondary | ICD-10-CM | POA: Diagnosis not present

## 2019-05-08 DIAGNOSIS — I712 Thoracic aortic aneurysm, without rupture: Secondary | ICD-10-CM | POA: Diagnosis present

## 2019-05-08 DIAGNOSIS — J439 Emphysema, unspecified: Secondary | ICD-10-CM | POA: Diagnosis not present

## 2019-05-08 DIAGNOSIS — N1831 Chronic kidney disease, stage 3a: Secondary | ICD-10-CM | POA: Diagnosis not present

## 2019-05-08 DIAGNOSIS — I130 Hypertensive heart and chronic kidney disease with heart failure and stage 1 through stage 4 chronic kidney disease, or unspecified chronic kidney disease: Secondary | ICD-10-CM | POA: Diagnosis present

## 2019-05-08 DIAGNOSIS — I2510 Atherosclerotic heart disease of native coronary artery without angina pectoris: Secondary | ICD-10-CM | POA: Diagnosis present

## 2019-05-08 DIAGNOSIS — E7800 Pure hypercholesterolemia, unspecified: Secondary | ICD-10-CM | POA: Diagnosis present

## 2019-05-08 DIAGNOSIS — R042 Hemoptysis: Secondary | ICD-10-CM

## 2019-05-08 DIAGNOSIS — Z66 Do not resuscitate: Secondary | ICD-10-CM | POA: Diagnosis present

## 2019-05-08 DIAGNOSIS — J9622 Acute and chronic respiratory failure with hypercapnia: Secondary | ICD-10-CM | POA: Diagnosis present

## 2019-05-08 DIAGNOSIS — I4892 Unspecified atrial flutter: Secondary | ICD-10-CM | POA: Diagnosis present

## 2019-05-08 DIAGNOSIS — J440 Chronic obstructive pulmonary disease with acute lower respiratory infection: Secondary | ICD-10-CM | POA: Diagnosis not present

## 2019-05-08 DIAGNOSIS — J209 Acute bronchitis, unspecified: Secondary | ICD-10-CM | POA: Diagnosis present

## 2019-05-08 DIAGNOSIS — I159 Secondary hypertension, unspecified: Secondary | ICD-10-CM | POA: Diagnosis not present

## 2019-05-08 DIAGNOSIS — K219 Gastro-esophageal reflux disease without esophagitis: Secondary | ICD-10-CM | POA: Diagnosis not present

## 2019-05-08 DIAGNOSIS — I700 Atherosclerosis of aorta: Secondary | ICD-10-CM | POA: Diagnosis present

## 2019-05-08 DIAGNOSIS — I5022 Chronic systolic (congestive) heart failure: Secondary | ICD-10-CM | POA: Diagnosis present

## 2019-05-08 DIAGNOSIS — Z23 Encounter for immunization: Secondary | ICD-10-CM | POA: Diagnosis not present

## 2019-05-08 DIAGNOSIS — J9621 Acute and chronic respiratory failure with hypoxia: Secondary | ICD-10-CM | POA: Diagnosis present

## 2019-05-08 DIAGNOSIS — N1832 Chronic kidney disease, stage 3b: Secondary | ICD-10-CM | POA: Diagnosis not present

## 2019-05-08 LAB — COMPREHENSIVE METABOLIC PANEL
ALT: 13 U/L (ref 0–44)
AST: 15 U/L (ref 15–41)
Albumin: 3.6 g/dL (ref 3.5–5.0)
Alkaline Phosphatase: 76 U/L (ref 38–126)
Anion gap: 10 (ref 5–15)
BUN: 33 mg/dL — ABNORMAL HIGH (ref 8–23)
CO2: 31 mmol/L (ref 22–32)
Calcium: 9 mg/dL (ref 8.9–10.3)
Chloride: 102 mmol/L (ref 98–111)
Creatinine, Ser: 1.54 mg/dL — ABNORMAL HIGH (ref 0.61–1.24)
GFR calc Af Amer: 50 mL/min — ABNORMAL LOW (ref >60)
GFR calc non Af Amer: 43 mL/min — ABNORMAL LOW (ref >60)
Glucose, Bld: 135 mg/dL — ABNORMAL HIGH (ref 70–99)
Potassium: 4 mmol/L (ref 3.5–5.1)
Sodium: 143 mmol/L (ref 135–145)
Total Bilirubin: 0.5 mg/dL (ref 0.3–1.2)
Total Protein: 6.9 g/dL (ref 6.5–8.1)

## 2019-05-08 LAB — TYPE AND SCREEN
ABO/RH(D): A POS
Antibody Screen: NEGATIVE

## 2019-05-08 LAB — EXPECTORATED SPUTUM ASSESSMENT W GRAM STAIN, RFLX TO RESP C

## 2019-05-08 LAB — BASIC METABOLIC PANEL
Anion gap: 10 (ref 5–15)
BUN: 34 mg/dL — ABNORMAL HIGH (ref 8–23)
CO2: 29 mmol/L (ref 22–32)
Calcium: 8.8 mg/dL — ABNORMAL LOW (ref 8.9–10.3)
Chloride: 102 mmol/L (ref 98–111)
Creatinine, Ser: 1.52 mg/dL — ABNORMAL HIGH (ref 0.61–1.24)
GFR calc Af Amer: 51 mL/min — ABNORMAL LOW (ref >60)
GFR calc non Af Amer: 44 mL/min — ABNORMAL LOW (ref >60)
Glucose, Bld: 140 mg/dL — ABNORMAL HIGH (ref 70–99)
Potassium: 4.1 mmol/L (ref 3.5–5.1)
Sodium: 141 mmol/L (ref 135–145)

## 2019-05-08 LAB — CBC
HCT: 40.4 % (ref 39.0–52.0)
Hemoglobin: 13.2 g/dL (ref 13.0–17.0)
MCH: 28.3 pg (ref 26.0–34.0)
MCHC: 32.7 g/dL (ref 30.0–36.0)
MCV: 86.5 fL (ref 80.0–100.0)
Platelets: 192 10*3/uL (ref 150–400)
RBC: 4.67 MIL/uL (ref 4.22–5.81)
RDW: 12.4 % (ref 11.5–15.5)
WBC: 11.3 10*3/uL — ABNORMAL HIGH (ref 4.0–10.5)
nRBC: 0 % (ref 0.0–0.2)

## 2019-05-08 LAB — SARS CORONAVIRUS 2 (TAT 6-24 HRS): SARS Coronavirus 2: NEGATIVE

## 2019-05-08 LAB — HIV ANTIBODY (ROUTINE TESTING W REFLEX): HIV Screen 4th Generation wRfx: NONREACTIVE

## 2019-05-08 MED ORDER — ACETAMINOPHEN 650 MG RE SUPP: 650.0000 mg | Freq: Four times a day (QID) | RECTAL | Status: DC | PRN

## 2019-05-08 MED ORDER — SODIUM CHLORIDE 0.9 % IV SOLN
500.0000 mg | Freq: Once | INTRAVENOUS | Status: AC
  Administered 2019-05-08: 500 mg via INTRAVENOUS
  Filled 2019-05-08: qty 500

## 2019-05-08 MED ORDER — ACETAMINOPHEN 325 MG PO TABS: 650.0000 mg | ORAL_TABLET | Freq: Four times a day (QID) | ORAL | Status: DC | PRN

## 2019-05-08 MED ORDER — ONDANSETRON HCL 4 MG/2ML IJ SOLN: 4.0000 mg | Freq: Four times a day (QID) | INTRAMUSCULAR | Status: DC | PRN

## 2019-05-08 MED ORDER — SACUBITRIL-VALSARTAN 24-26 MG PO TABS
1.0000 | ORAL_TABLET | Freq: Two times a day (BID) | ORAL | Status: DC
  Administered 2019-05-08 – 2019-05-13 (×10): 1 via ORAL
  Filled 2019-05-08 (×13): qty 1

## 2019-05-08 MED ORDER — IPRATROPIUM-ALBUTEROL 0.5-2.5 (3) MG/3ML IN SOLN
3.0000 mL | Freq: Four times a day (QID) | RESPIRATORY_TRACT | Status: DC
  Administered 2019-05-08: 3 mL via RESPIRATORY_TRACT
  Filled 2019-05-08 (×2): qty 3

## 2019-05-08 MED ORDER — SERTRALINE HCL 50 MG PO TABS
50.0000 mg | ORAL_TABLET | Freq: Every day | ORAL | Status: DC
  Administered 2019-05-08 – 2019-05-13 (×5): 50 mg via ORAL
  Filled 2019-05-08 (×6): qty 1

## 2019-05-08 MED ORDER — ONDANSETRON HCL 4 MG PO TABS
4.0000 mg | ORAL_TABLET | Freq: Four times a day (QID) | ORAL | Status: DC | PRN
  Filled 2019-05-08: qty 1

## 2019-05-08 MED ORDER — SODIUM CHLORIDE 0.9 % IV SOLN
1.0000 g | INTRAVENOUS | Status: DC
  Administered 2019-05-09 – 2019-05-10 (×2): 1 g via INTRAVENOUS
  Filled 2019-05-08 (×2): qty 10
  Filled 2019-05-08: qty 1

## 2019-05-08 MED ORDER — IPRATROPIUM-ALBUTEROL 20-100 MCG/ACT IN AERS
1.0000 | INHALATION_SPRAY | Freq: Four times a day (QID) | RESPIRATORY_TRACT | Status: DC
  Administered 2019-05-08 – 2019-05-13 (×19): 1 via RESPIRATORY_TRACT
  Filled 2019-05-08: qty 4

## 2019-05-08 MED ORDER — SODIUM CHLORIDE 0.9 % IV SOLN
INTRAVENOUS | Status: DC
  Administered 2019-05-08 (×2): via INTRAVENOUS

## 2019-05-08 MED ORDER — METHYLPREDNISOLONE SODIUM SUCC 125 MG IJ SOLR
60.0000 mg | Freq: Three times a day (TID) | INTRAMUSCULAR | Status: DC
  Administered 2019-05-08 – 2019-05-10 (×6): 60 mg via INTRAVENOUS
  Filled 2019-05-08 (×5): qty 2
  Filled 2019-05-08: qty 0.96
  Filled 2019-05-08: qty 2
  Filled 2019-05-08: qty 0.96

## 2019-05-08 MED ORDER — MAGNESIUM HYDROXIDE 400 MG/5ML PO SUSP: 30.0000 mL | Freq: Every day | ORAL | Status: DC | PRN

## 2019-05-08 MED ORDER — INFLUENZA VAC A&B SA ADJ QUAD 0.5 ML IM PRSY
0.5000 mL | PREFILLED_SYRINGE | INTRAMUSCULAR | Status: AC
  Administered 2019-05-11: 0.5 mL via INTRAMUSCULAR
  Filled 2019-05-08 (×3): qty 0.5

## 2019-05-08 MED ORDER — HYDROCOD POLST-CPM POLST ER 10-8 MG/5ML PO SUER: 5.0000 mL | Freq: Two times a day (BID) | ORAL | Status: DC | PRN

## 2019-05-08 MED ORDER — CARVEDILOL 12.5 MG PO TABS
12.5000 mg | ORAL_TABLET | Freq: Two times a day (BID) | ORAL | Status: DC
  Administered 2019-05-08 – 2019-05-11 (×7): 12.5 mg via ORAL
  Filled 2019-05-08 (×8): qty 1

## 2019-05-08 MED ORDER — IPRATROPIUM-ALBUTEROL 0.5-2.5 (3) MG/3ML IN SOLN
3.0000 mL | Freq: Once | RESPIRATORY_TRACT | Status: AC
  Administered 2019-05-08: 3 mL via RESPIRATORY_TRACT
  Filled 2019-05-08: qty 3

## 2019-05-08 MED ORDER — PRAVASTATIN SODIUM 20 MG PO TABS
40.0000 mg | ORAL_TABLET | Freq: Every day | ORAL | Status: DC
  Administered 2019-05-08 – 2019-05-12 (×5): 40 mg via ORAL
  Filled 2019-05-08: qty 1
  Filled 2019-05-08 (×5): qty 2

## 2019-05-08 MED ORDER — IOHEXOL 350 MG/ML SOLN
60.0000 mL | Freq: Once | INTRAVENOUS | Status: AC | PRN
  Administered 2019-05-08: 60 mL via INTRAVENOUS

## 2019-05-08 MED ORDER — SODIUM CHLORIDE 0.9 % IV SOLN
500.0000 mg | INTRAVENOUS | Status: DC
  Administered 2019-05-09 – 2019-05-10 (×2): 500 mg via INTRAVENOUS
  Filled 2019-05-08 (×3): qty 500

## 2019-05-08 MED ORDER — METHYLPREDNISOLONE SODIUM SUCC 125 MG IJ SOLR
60.0000 mg | Freq: Once | INTRAMUSCULAR | Status: AC
  Administered 2019-05-08: 60 mg via INTRAVENOUS
  Filled 2019-05-08: qty 2

## 2019-05-08 MED ORDER — AMLODIPINE BESYLATE 5 MG PO TABS
5.0000 mg | ORAL_TABLET | Freq: Every day | ORAL | Status: DC
  Administered 2019-05-08 – 2019-05-13 (×4): 5 mg via ORAL
  Filled 2019-05-08 (×5): qty 1

## 2019-05-08 MED ORDER — TRAZODONE HCL 50 MG PO TABS
25.0000 mg | ORAL_TABLET | Freq: Every evening | ORAL | Status: DC | PRN
  Filled 2019-05-08: qty 0.5

## 2019-05-08 MED ORDER — SODIUM CHLORIDE 0.9 % IV SOLN
1.0000 g | Freq: Once | INTRAVENOUS | Status: AC
  Administered 2019-05-08: 1 g via INTRAVENOUS
  Filled 2019-05-08: qty 10

## 2019-05-08 MED ORDER — GUAIFENESIN ER 600 MG PO TB12
600.0000 mg | ORAL_TABLET | Freq: Two times a day (BID) | ORAL | Status: DC
  Administered 2019-05-08 – 2019-05-13 (×10): 600 mg via ORAL
  Filled 2019-05-08 (×11): qty 1

## 2019-05-08 MED ORDER — ALBUTEROL SULFATE (2.5 MG/3ML) 0.083% IN NEBU: 2.5000 mg | INHALATION_SOLUTION | Freq: Four times a day (QID) | RESPIRATORY_TRACT | Status: DC | PRN

## 2019-05-08 NOTE — H&P (Signed)
[facility] NAME: Phillip Allison    MR#:  [id number]  DATE OF BIRTH:  September 14, 1940  DATE OF ADMISSION:  05/07/2019  PRIMARY CARE PHYSICIAN: Jerrol Banana., MD   REQUESTING/REFERRING PHYSICIAN: Marjean Donna, MD  CHIEF COMPLAINT:   Chief Complaint  Patient presents with   Hemoptysis    HISTORY OF PRESENT ILLNESS:  Phillip Allison  is a 78 y.o. Caucasian male with a known history of CHF, COPD, depression, hypertension, coronary artery disease and dyslipidemia, who presented to the emergency room with acute onset of excessive cough with subsequent hemoptysis with pure blood that amounted to about 5 to 10 mL, with associated dyspnea and wheezing without fever or chills.  He denied any nausea or vomiting or hematemesis.  No other bleeding diathesis.  He was markedly pale per his son after that.  When his son applied pulse oximetry his O2 level was 83%.  The patient denies any chest pain or palpitations.  No dysuria, oliguria or hematuria or flank pain.  He denies any rhinorrhea or nasal congestion or sore throat or earache.  Upon presentation to the emergency room, blood pressure was 158/90 with pulse symmetry of 83% on room air with otherwise normal vital signs.  Labs revealed a BUN of 33 and creatinine 1.54 better than previous levels in March and CBC with hemoglobin of 13.8 hematocrit 43.6.  The patient was typed and crossmatched.  He had a portable chest x-ray that revealed chronic changes with no acute abnormalities.  CT angiography revealed no evidence for PE but showed aneurysmal dilation of the ascending aorta similar to that seen on prior exam with slight improvement of the scarring in the right upper lobe when compared to prior exam and some patchy opacities noted throughout both lungs stable from previous study.  It showed aortic atherosclerosis and emphysema.  The patient was given IV Rocephin and Zithromax was duo nebs and IV  Solu-Medrol.  He will be admitted to an observation medical monitored bed for further evaluation and management. PAST MEDICAL HISTORY:   Past Medical History:  Diagnosis Date   CHF (congestive heart failure) (HCC)    COPD (chronic obstructive pulmonary disease) (HCC)    Depression    Emphysema lung (HCC)    Hypercholesterolemia    Hypertension    Myocardial infarction ([place])    On home oxygen therapy    2 liters at night and as needed   Pneumonia    in the past   Presence of permanent cardiac pacemaker    Shortness of breath dyspnea    with exertion    PAST SURGICAL HISTORY:   Past Surgical History:  Procedure Laterality Date   ABDOMINAL AORTIC ANEURYSM REPAIR     ANGIOPLASTY     left lower extremity   CATARACT EXTRACTION W/PHACO Right 12/18/2015   Procedure: CATARACT EXTRACTION PHACO AND INTRAOCULAR LENS PLACEMENT ([place]);  Surgeon: Estill Cotta, MD;  Location: [facility] ORS;  Service: Ophthalmology;  Laterality: Right;  [place] 01:58 AP% 23.5 CDE 50.91 fluid pack lot # [id number]   INSERT / REPLACE / REMOVE PACEMAKER     PACEMAKER PLACEMENT     TONSILLECTOMY      SOCIAL HISTORY:   Social History   Tobacco Use   Smoking status: Former Smoker    Packs/day: 0.75    Years: 54.00    Pack years: 40.50    Types: Cigarettes    Quit date: 2014    Years since  quitting: 6.8   Smokeless tobacco: Never Used   Tobacco comment: 2014?  Substance Use Topics   Alcohol use: No    FAMILY HISTORY:   Family History  Problem Relation Age of Onset   Psoriasis Mother    Heart attack Father     DRUG ALLERGIES:  No Known Allergies  REVIEW OF SYSTEMS:   ROS As per history of present illness. All pertinent systems were reviewed above. Constitutional,  HEENT, cardiovascular, respiratory, GI, GU, musculoskeletal, neuro, psychiatric, endocrine,  integumentary and hematologic systems were reviewed and are otherwise  negative/unremarkable except for positive  findings mentioned above in the HPI.   MEDICATIONS AT HOME:   Prior to Admission medications   Medication Sig Start Date End Date Taking? Authorizing Provider  albuterol (PROVENTIL) (2.5 MG/3ML) 0.083% nebulizer solution USE 1 VIAL VIA NEBULIZER EVERY 6 HOURS AS NEEDED FOR WHEEZING OR SHORTNESS OF BREATH Patient taking differently: Take 2.5 mg by nebulization every 6 (six) hours as needed for wheezing or shortness of breath.  12/21/18  Yes Jerrol Banana., MD  amLODipine (NORVASC) 5 MG tablet Take 1 tablet (5 mg total) by mouth daily. 10/10/17  Yes Fritzi Mandes, MD  aspirin 81 MG tablet Take 81 mg by mouth at bedtime.  08/22/11  Yes [provider]  carvedilol (COREG) 12.5 MG tablet Take 12.5 mg by mouth 2 (two) times daily with a meal.  08/22/11  Yes [provider]  ENTRESTO 24-26 MG Take 1 tablet by mouth 2 (two) times daily. 05/05/19  Yes [provider]  pravastatin (PRAVACHOL) 40 MG tablet Take 1 tablet (40 mg total) by mouth at bedtime. 10/12/18  Yes Jerrol Banana., MD  sertraline (ZOLOFT) 50 MG tablet Take 1 tablet (50 mg total) by mouth daily. 07/22/18  Yes Jerrol Banana., MD      VITAL SIGNS:  Blood pressure (!) 150/75, pulse 86, temperature 97.8 F (36.6 C), temperature source Oral, resp. rate 13, height 5\' 11"  (1.803 m), weight 86.2 kg, SpO2 93 %.  PHYSICAL EXAMINATION:  Physical Exam  GENERAL:  78 y.o.-year-old Caucasian male patient lying in the bed in mild respiratory distress with conversational dyspnea. EYES: Pupils equal, round, reactive to light and accommodation. No scleral icterus. Extraocular muscles intact.  HEENT: Head atraumatic, normocephalic. Oropharynx and nasopharynx clear.  NECK:  Supple, no jugular venous distention. No thyroid enlargement, no tenderness.  LUNGS: Minimally diminished bibasal breath sounds with occasional expiratory and expiratory wheezing and occasional rhonchi. CARDIOVASCULAR: Regular rate and  rhythm, S1, S2 normal. No murmurs, rubs, or gallops.  ABDOMEN: Soft, nondistended, nontender. Bowel sounds present. No organomegaly or mass.  EXTREMITIES: No pedal edema, cyanosis, or clubbing.  NEUROLOGIC: Cranial nerves II through XII are intact. Muscle strength 5/5 in all extremities. Sensation intact. Gait not checked.  PSYCHIATRIC: The patient is alert and oriented x 3.  Normal affect and good eye contact. SKIN: No obvious rash, lesion, or ulcer.   LABORATORY PANEL:   CBC Recent Labs  Lab 05/07/19 2331  WBC 11.1*  HGB 13.8  HCT 43.6  PLT 147*   ------------------------------------------------------------------------------------------------------------------  Chemistries  Recent Labs  Lab 05/08/19 0030  NA 143  K 4.0  CL 102  CO2 31  GLUCOSE 135*  BUN 33*  CREATININE 1.54*  CALCIUM 9.0  AST 15  ALT 13  ALKPHOS 76  BILITOT 0.5   ------------------------------------------------------------------------------------------------------------------  Cardiac Enzymes No results for input(s): TROPONINI in the last 168 hours. ------------------------------------------------------------------------------------------------------------------  RADIOLOGY:  Ct Angio Chest Pe W And/or Wo Contrast  Result Date: 05/08/2019 CLINICAL DATA:  Hemoptysis and chest pain EXAM: CT ANGIOGRAPHY CHEST WITH CONTRAST TECHNIQUE: Multidetector CT imaging of the chest was performed using the standard protocol during bolus administration of intravenous contrast. Multiplanar CT image reconstructions and MIPs were obtained to evaluate the vascular anatomy. CONTRAST:  35mL OMNIPAQUE IOHEXOL 350 MG/ML SOLN COMPARISON:  Film from earlier in the same day, CT from 06/11/2018 FINDINGS: Cardiovascular: Thoracic aorta demonstrates atherosclerotic calcifications. No aneurysmal dilatation in the ascending aorta is seen although stable descending thoracic aortic dilatation is noted with irregular atherosclerotic  plaque. Maximum dilatation of the descending thoracic aorta is stable at 4.5 cm similar to that seen on the prior exam. Pacing device is noted and stable. The pulmonary artery shows a normal branching pattern without intraluminal filling defect to suggest pulmonary embolism. Coronary calcifications are noted. Calcifications of the aortic valve are noted. Mediastinum/Nodes: Thoracic inlet is within normal limits. No hilar or mediastinal adenopathy is noted. The esophagus as visualized is within normal limits. Lungs/Pleura: The lungs are well aerated bilaterally. No focal confluent infiltrate is seen. Patchy scarring is noted in the right upper lobe improved from the prior exam. Mild emphysematous changes are noted. Patchy scarring is noted within the lungs bilaterally. No focal confluent infiltrate is seen. Upper Abdomen: The right kidney is shrunken but stable from the prior exam. The left kidney is somewhat hypertrophied. The remainder of the upper abdomen is within normal limits. Musculoskeletal: Degenerative changes of the thoracic spine are noted. No acute rib abnormality is noted. No compression deformity is seen. Review of the MIP images confirms the above findings. IMPRESSION: No evidence of pulmonary emboli. 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 Slight improvement of the scarring in the right upper lobe when compared with the prior exam. Some patchy opacities are noted throughout both lungs stable from the previous study. Aortic Atherosclerosis (ICD10-I70.0) and Emphysema (ICD10-J43.9). Electronically Signed   By: Inez Catalina M.D.   On: 05/08/2019 02:39   Dg Chest Portable 1 View  Result Date: 05/07/2019 CLINICAL DATA:  Hemoptysis and shortness of breath EXAM: PORTABLE CHEST 1 VIEW COMPARISON:  09/10/2018 FINDINGS: Cardiac shadow is stable. Defibrillator is again noted and stable. Aortic calcifications are seen. No focal infiltrate or sizable effusion is  seen. Mild chronic interstitial changes are again noted and stable. Degenerative changes of the thoracic spine are seen. IMPRESSION: Chronic changes without acute abnormality. Electronically Signed   By: Inez Catalina M.D.   On: 05/07/2019 23:59      IMPRESSION AND PLAN:   1.  Hemoptysis likely with acute bronchitis and subsequent mild COPD exacerbation.  The patient will be admitted to a medical monitored bed.  We will continue him on IV Rocephin and Zithromax as well as scheduled and as needed duo nebs and IV Solu-Medrol.  We will obtain sputum Gram stain culture and sensitivity.  A pulmonary consultation will be obtained.  I notified Dr. Wallene Huh about the patient.  2.  Hypertension.  We will continue Entresto, Coreg and amlodipine.  3.  Dyslipidemia.  Continue statin therapy.  4.  Depression.  We will continue Zoloft.  5.  DVT prophylaxis.  SCDs.  Medical prophylaxis currently continue.  Due to his hemoptysis    All the records are reviewed and case discussed with ED provider. The plan of care was discussed in details with the patient (and family). I answered all questions. The  patient agreed to proceed with the above mentioned plan. Further management will depend upon hospital course.   CODE STATUS: Full code  TOTAL TIME TAKING CARE OF THIS PATIENT: 55 minutes.    Christel Mormon M.D on 05/08/2019 at 5:32 AM  [facility]   From 7 PM-7 AM, contact night-coverage [email or web address]  CC: Primary care physician; Jerrol Banana., MD   Note: This dictation was prepared with Dragon dictation along with smaller phrase technology. Any transcriptional errors that result from this process are unintentional.

## 2019-05-08 NOTE — ED Notes (Signed)
Report received from Maudie Mercury, [place]. Pt resting comfortably in ED stretcher. Stretcher locked and in lowest position. Introduced self to pt. Pt has no complaints at this time. Explained the delay with the pt. Call bell within reach. Son at bedside.

## 2019-05-08 NOTE — Progress Notes (Signed)
PROGRESS NOTE    Phillip Allison.  OVZ:[id number] DOB: Jul 31, 1940 DOA: 05/07/2019 PCP: Jerrol Banana., MD   Brief Narrative:  This is a 78 year old white male with a known history of CHF, COPD, depression, hypertension, CAD, and hyperlipidemia who presented to the emergency room with acute onset of excessive cough with subsequent a mopped assist of bright red blood approximately 5 to 10 mL per his report.  Patient reported in the ER has been occurring over the last 24 hours.  He was found to be hypoxic at 83%.   Assessment & Plan:   Active Problems:   Hemoptysis   1.  Hemoptysis likely with acute bronchitis and subsequent mild COPD exacerbation.   Continue IV Rocephin and Zithromax Scheduled and as needed nebs IV steroids Follow-up sputum culture with Gram stain Follow-up pulmonary's consultation, admitting notified Dr. Wallene Huh about the patient.  2.  Hypertension.  We will continue Entresto, Coreg and amlodipine.  3.  Dyslipidemia.  Continue statin therapy.  4.  Depression.  We will continue Zoloft.  DVT prophylaxis: SCD/Compression stockings  Code Status: DNR    Code Status Orders  (From admission, onward)         Start     Ordered   05/08/19 0413  Do not attempt resuscitation (DNR)  Continuous    Question Answer Comment  In the event of cardiac or respiratory ARREST Do not call a code blue   In the event of cardiac or respiratory ARREST Do not perform Intubation, CPR, defibrillation or ACLS   In the event of cardiac or respiratory ARREST Use medication by any route, position, wound care, and other measures to relive pain and suffering. May use oxygen, suction and manual treatment of airway obstruction as needed for comfort.      05/08/19 0413        Code Status History    Date Active Date Inactive Code Status Order ID Comments User Context   05/08/2019 0413 05/08/2019 0413 Full Code [id number]  Christel Mormon, MD ED   12/16/2017 0150  12/18/2017 1454 DNR [id number]  Amelia Jo, MD Inpatient   10/08/2017 0243 10/09/2017 1625 DNR [id number]  Arta Silence, MD Inpatient   Advance Care Planning Activity    Advance Directive Documentation     Most Recent Value  Type of Advance Directive  Healthcare Power of Attorney, Living will  Pre-existing out of facility DNR order (yellow form or pink MOST form)  --  "MOST" Form in Place?  --     Family Communication: None today Disposition Plan:   Patient will remain in hospital for continued evaluation, IV antibiotics, hemodynamic monitoring, subspecialty evaluation in the setting of hemoptysis.  Patient not stable for medical discharge Consults called: None Admission status: Inpatient   Consultants:   Pulmonology  Procedures:  Ct Angio Chest Pe W And/or Wo Contrast  Result Date: 05/08/2019 CLINICAL DATA:  Hemoptysis and chest pain EXAM: CT ANGIOGRAPHY CHEST WITH CONTRAST TECHNIQUE: Multidetector CT imaging of the chest was performed using the standard protocol during bolus administration of intravenous contrast. Multiplanar CT image reconstructions and MIPs were obtained to evaluate the vascular anatomy. CONTRAST:  74mL OMNIPAQUE IOHEXOL 350 MG/ML SOLN COMPARISON:  Film from earlier in the same day, CT from 06/11/2018 FINDINGS: Cardiovascular: Thoracic aorta demonstrates atherosclerotic calcifications. No aneurysmal dilatation in the ascending aorta is seen although stable descending thoracic aortic dilatation is noted with irregular atherosclerotic plaque. Maximum dilatation of the descending thoracic aorta is stable at  4.5 cm similar to that seen on the prior exam. Pacing device is noted and stable. The pulmonary artery shows a normal branching pattern without intraluminal filling defect to suggest pulmonary embolism. Coronary calcifications are noted. Calcifications of the aortic valve are noted. Mediastinum/Nodes: Thoracic inlet is within normal limits. No hilar or mediastinal  adenopathy is noted. The esophagus as visualized is within normal limits. Lungs/Pleura: The lungs are well aerated bilaterally. No focal confluent infiltrate is seen. Patchy scarring is noted in the right upper lobe improved from the prior exam. Mild emphysematous changes are noted. Patchy scarring is noted within the lungs bilaterally. No focal confluent infiltrate is seen. Upper Abdomen: The right kidney is shrunken but stable from the prior exam. The left kidney is somewhat hypertrophied. The remainder of the upper abdomen is within normal limits. Musculoskeletal: Degenerative changes of the thoracic spine are noted. No acute rib abnormality is noted. No compression deformity is seen. Review of the MIP images confirms the above findings. IMPRESSION: No evidence of pulmonary emboli. 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 Slight improvement of the scarring in the right upper lobe when compared with the prior exam. Some patchy opacities are noted throughout both lungs stable from the previous study. Aortic Atherosclerosis (ICD10-I70.0) and Emphysema (ICD10-J43.9). Electronically Signed   By: Inez Catalina M.D.   On: 05/08/2019 02:39   Dg Chest Portable 1 View  Result Date: 05/07/2019 CLINICAL DATA:  Hemoptysis and shortness of breath EXAM: PORTABLE CHEST 1 VIEW COMPARISON:  09/10/2018 FINDINGS: Cardiac shadow is stable. Defibrillator is again noted and stable. Aortic calcifications are seen. No focal infiltrate or sizable effusion is seen. Mild chronic interstitial changes are again noted and stable. Degenerative changes of the thoracic spine are seen. IMPRESSION: Chronic changes without acute abnormality. Electronically Signed   By: Inez Catalina M.D.   On: 05/07/2019 23:59     Antimicrobials:   Azithromycin and Rocephin day 2   Subjective: Patient made overnight secondary hemoptysis Reports became progressively worse over 24 hours  Objective: Vitals:    05/08/19 0932 05/08/19 1200 05/08/19 1400 05/08/19 1427  BP: (!) 148/74 (!) 144/78    Pulse: 79 73  69  Resp:  18 16 16   Temp: 98.2 F (36.8 C) 97.9 F (36.6 C)    TempSrc: Oral Oral    SpO2: 97% 97%  98%  Weight:      Height:       No intake or output data in the 24 hours ending 05/08/19 1553 Filed Weights   05/07/19 2325  Weight: 86.2 kg    Examination:  General exam: Appears calm and comfortable  Respiratory system: Rhonchi bilaterally, trace wheezing bilaterally Cardiovascular system: S1 & S2 heard, RRR. No JVD, murmurs, rubs, gallops or clicks. No pedal edema. Gastrointestinal system: Abdomen is nondistended, soft and nontender. No organomegaly or masses felt. Normal bowel sounds heard. Central nervous system: Alert and oriented. No focal neurological deficits. Extremities: Warm well perfused does move all 4 extremities skin: No rashes, lesions or ulcers Psychiatry: Judgement and insight appear normal. Mood & affect appropriate.     Data Reviewed: I have personally reviewed following labs and imaging studies  CBC: Recent Labs  Lab 05/07/19 2331 05/08/19 0653  WBC 11.1* 11.3*  HGB 13.8 13.2  HCT 43.6 40.4  MCV 89.5 86.5  PLT 147* 229   Basic Metabolic Panel: Recent Labs  Lab 05/08/19 0030 05/08/19 0653  NA 143 141  K 4.0 4.1  CL 102 102  CO2 31 29  GLUCOSE 135* 140*  BUN 33* 34*  CREATININE 1.54* 1.52*  CALCIUM 9.0 8.8*   GFR: Estimated Creatinine Clearance: 44 mL/min (A) (by C-G formula based on SCr of 1.52 mg/dL (H)). Liver Function Tests: Recent Labs  Lab 05/08/19 0030  AST 15  ALT 13  ALKPHOS 76  BILITOT 0.5  PROT 6.9  ALBUMIN 3.6   No results for input(s): LIPASE, AMYLASE in the last 168 hours. No results for input(s): AMMONIA in the last 168 hours. Coagulation Profile: No results for input(s): INR, PROTIME in the last 168 hours. Cardiac Enzymes: No results for input(s): CKTOTAL, CKMB, CKMBINDEX, TROPONINI in the last 168 hours. BNP  (last 3 results) Recent Labs    09/10/18 0957  PROBNP 5,918*   HbA1C: No results for input(s): HGBA1C in the last 72 hours. CBG: No results for input(s): GLUCAP in the last 168 hours. Lipid Profile: No results for input(s): CHOL, HDL, LDLCALC, TRIG, CHOLHDL, LDLDIRECT in the last 72 hours. Thyroid Function Tests: No results for input(s): TSH, T4TOTAL, FREET4, T3FREE, THYROIDAB in the last 72 hours. Anemia Panel: No results for input(s): VITAMINB12, FOLATE, FERRITIN, TIBC, IRON, RETICCTPCT in the last 72 hours. Sepsis Labs: No results for input(s): PROCALCITON, LATICACIDVEN in the last 168 hours.  Recent Results (from the past 240 hour(s))  Expectorated sputum assessment w rflx to resp cult     Status: None   Collection Time: 05/08/19  6:53 AM   Specimen: Sputum  Result Value Ref Range Status   Specimen Description SPUTUM  Final   Special Requests NONE  Final   Sputum evaluation   Final    THIS SPECIMEN IS ACCEPTABLE FOR SPUTUM CULTURE Performed at [facility], [address] [place], [place] [address]    Report Status 05/08/2019 FINAL  Final         Radiology Studies: Ct Angio Chest Pe W And/or Wo Contrast  Result Date: 05/08/2019 CLINICAL DATA:  Hemoptysis and chest pain EXAM: CT ANGIOGRAPHY CHEST WITH CONTRAST TECHNIQUE: Multidetector CT imaging of the chest was performed using the standard protocol during bolus administration of intravenous contrast. Multiplanar CT image reconstructions and MIPs were obtained to evaluate the vascular anatomy. CONTRAST:  52mL OMNIPAQUE IOHEXOL 350 MG/ML SOLN COMPARISON:  Film from earlier in the same day, CT from 06/11/2018 FINDINGS: Cardiovascular: Thoracic aorta demonstrates atherosclerotic calcifications. No aneurysmal dilatation in the ascending aorta is seen although stable descending thoracic aortic dilatation is noted with irregular atherosclerotic plaque. Maximum dilatation of the descending thoracic aorta is stable at  4.5 cm similar to that seen on the prior exam. Pacing device is noted and stable. The pulmonary artery shows a normal branching pattern without intraluminal filling defect to suggest pulmonary embolism. Coronary calcifications are noted. Calcifications of the aortic valve are noted. Mediastinum/Nodes: Thoracic inlet is within normal limits. No hilar or mediastinal adenopathy is noted. The esophagus as visualized is within normal limits. Lungs/Pleura: The lungs are well aerated bilaterally. No focal confluent infiltrate is seen. Patchy scarring is noted in the right upper lobe improved from the prior exam. Mild emphysematous changes are noted. Patchy scarring is noted within the lungs bilaterally. No focal confluent infiltrate is seen. Upper Abdomen: The right kidney is shrunken but stable from the prior exam. The left kidney is somewhat hypertrophied. The remainder of the upper abdomen is within normal limits. Musculoskeletal: Degenerative changes of the thoracic spine are noted. No acute rib abnormality is noted. No compression deformity  is seen. Review of the MIP images confirms the above findings. IMPRESSION: No evidence of pulmonary emboli. 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 Slight improvement of the scarring in the right upper lobe when compared with the prior exam. Some patchy opacities are noted throughout both lungs stable from the previous study. Aortic Atherosclerosis (ICD10-I70.0) and Emphysema (ICD10-J43.9). Electronically Signed   By: Inez Catalina M.D.   On: 05/08/2019 02:39   Dg Chest Portable 1 View  Result Date: 05/07/2019 CLINICAL DATA:  Hemoptysis and shortness of breath EXAM: PORTABLE CHEST 1 VIEW COMPARISON:  09/10/2018 FINDINGS: Cardiac shadow is stable. Defibrillator is again noted and stable. Aortic calcifications are seen. No focal infiltrate or sizable effusion is seen. Mild chronic interstitial changes are again noted and stable.  Degenerative changes of the thoracic spine are seen. IMPRESSION: Chronic changes without acute abnormality. Electronically Signed   By: Inez Catalina M.D.   On: 05/07/2019 23:59        Scheduled Meds:  amLODipine  5 mg Oral Daily   carvedilol  12.5 mg Oral BID WC   guaiFENesin  600 mg Oral BID   ipratropium-albuterol  3 mL Nebulization QID   methylPREDNISolone (SOLU-MEDROL) injection  60 mg Intravenous Q8H   pravastatin  40 mg Oral QHS   sacubitril-valsartan  1 tablet Oral BID   sertraline  50 mg Oral Daily   Continuous Infusions:  sodium chloride 75 mL/hr at 05/08/19 0737   [START ON 05/09/2019] azithromycin     [START ON 05/09/2019] cefTRIAXone (ROCEPHIN)  IV       LOS: 0 days    Time spent: 16 min    Nicolette Bang, MD [facility]  If 7PM-7AM, please contact night-coverage  05/08/2019, 3:53 PM

## 2019-05-09 DIAGNOSIS — E7800 Pure hypercholesterolemia, unspecified: Secondary | ICD-10-CM

## 2019-05-09 DIAGNOSIS — F329 Major depressive disorder, single episode, unspecified: Secondary | ICD-10-CM

## 2019-05-09 LAB — BASIC METABOLIC PANEL
Anion gap: 9 (ref 5–15)
BUN: 44 mg/dL — ABNORMAL HIGH (ref 8–23)
CO2: 25 mmol/L (ref 22–32)
Calcium: 8.5 mg/dL — ABNORMAL LOW (ref 8.9–10.3)
Chloride: 107 mmol/L (ref 98–111)
Creatinine, Ser: 1.45 mg/dL — ABNORMAL HIGH (ref 0.61–1.24)
GFR calc Af Amer: 54 mL/min — ABNORMAL LOW (ref >60)
GFR calc non Af Amer: 46 mL/min — ABNORMAL LOW (ref >60)
Glucose, Bld: 153 mg/dL — ABNORMAL HIGH (ref 70–99)
Potassium: 3.8 mmol/L (ref 3.5–5.1)
Sodium: 141 mmol/L (ref 135–145)

## 2019-05-09 LAB — CBC WITH DIFFERENTIAL/PLATELET
Abs Immature Granulocytes: 0.08 10*3/uL — ABNORMAL HIGH (ref 0.00–0.07)
Basophils Absolute: 0 10*3/uL (ref 0.0–0.1)
Basophils Relative: 0 %
Eosinophils Absolute: 0 10*3/uL (ref 0.0–0.5)
Eosinophils Relative: 0 %
HCT: 39.5 % (ref 39.0–52.0)
Hemoglobin: 12.6 g/dL — ABNORMAL LOW (ref 13.0–17.0)
Immature Granulocytes: 1 %
Lymphocytes Relative: 10 %
Lymphs Abs: 1.2 10*3/uL (ref 0.7–4.0)
MCH: 28.2 pg (ref 26.0–34.0)
MCHC: 31.9 g/dL (ref 30.0–36.0)
MCV: 88.4 fL (ref 80.0–100.0)
Monocytes Absolute: 0.2 10*3/uL (ref 0.1–1.0)
Monocytes Relative: 1 %
Neutro Abs: 10.2 10*3/uL — ABNORMAL HIGH (ref 1.7–7.7)
Neutrophils Relative %: 88 %
Platelets: 195 10*3/uL (ref 150–400)
RBC: 4.47 MIL/uL (ref 4.22–5.81)
RDW: 12.3 % (ref 11.5–15.5)
WBC: 11.7 10*3/uL — ABNORMAL HIGH (ref 4.0–10.5)
nRBC: 0 % (ref 0.0–0.2)

## 2019-05-09 NOTE — Progress Notes (Signed)
PROGRESS NOTE    Phillip Allison.  MBE:[id number] DOB: 22-May-1941 DOA: 05/07/2019 PCP: Jerrol Banana., MD   Brief Narrative:  This is a 78 year old white male with a known history of CHF, COPD, depression, hypertension, CAD, and hyperlipidemia who presented to the emergency room with acute onset of excessive cough with subsequent a mopped assist of bright red blood approximately 5 to 10 mL per his report.  Patient reported in the ER has been occurring over the last 24 hours.  He was found to be hypoxic at 83%.   Assessment & Plan:   Active Problems:   Hypercholesterolemia without hypertriglyceridemia   Hemoptysis   HTN (hypertension)   Depression   1. Hemoptysis likely with acute bronchitis and subsequent mild COPD exacerbation.  Continue IV Rocephin and Zithromax Hgb 13.8>13.2>12.6 Scheduled and as needed nebs IV steroids Follow-up sputum culture with Gram stain-reincubating Follow-up pulmonary's consultation, admitting notified Dr. Wallene Huh about the patient-reached out again 11/15 this AM and again this afternoon by phone-Consult pending  2. Hypertension.We will continue Entresto, Coreg and amlodipine.  3. Dyslipidemia. Continue statin therapy.  4. Depression. We will continue Zoloft.  DVT prophylaxis: SCD/Compression stockings  Code Status: DNR    Code Status Orders  (From admission, onward)         Start     Ordered   05/08/19 0413  Do not attempt resuscitation (DNR)  Continuous    Question Answer Comment  In the event of cardiac or respiratory ARREST Do not call a code blue   In the event of cardiac or respiratory ARREST Do not perform Intubation, CPR, defibrillation or ACLS   In the event of cardiac or respiratory ARREST Use medication by any route, position, wound care, and other measures to relive pain and suffering. May use oxygen, suction and manual treatment of airway obstruction as needed for comfort.      05/08/19  0413        Code Status History    Date Active Date Inactive Code Status Order ID Comments User Context   05/08/2019 0413 05/08/2019 0413 Full Code [id number]  Christel Mormon, MD ED   12/16/2017 0150 12/18/2017 1454 DNR [id number]  Amelia Jo, MD Inpatient   10/08/2017 0243 10/09/2017 1625 DNR [id number]  Arta Silence, MD Inpatient   Advance Care Planning Activity    Advance Directive Documentation     Most Recent Value  Type of Advance Directive  Healthcare Power of Attorney, Living will, Out of facility DNR (pink MOST or yellow form)  Pre-existing out of facility DNR order (yellow form or pink MOST form)  --  "MOST" Form in Place?  --     Family Communication: Spoke with son answered all questions Disposition Plan:   Patient will be an inpatient for continued IV antibiotics, IV steroids, respiratory support and subspecialty evaluation.  Patient not yet medically stable for discharge  consults called: Pulmonology Admission status: Inpatient   Consultants:   None  Procedures:  Ct Angio Chest Pe W And/or Wo Contrast  Result Date: 05/08/2019 CLINICAL DATA:  Hemoptysis and chest pain EXAM: CT ANGIOGRAPHY CHEST WITH CONTRAST TECHNIQUE: Multidetector CT imaging of the chest was performed using the standard protocol during bolus administration of intravenous contrast. Multiplanar CT image reconstructions and MIPs were obtained to evaluate the vascular anatomy. CONTRAST:  37mL OMNIPAQUE IOHEXOL 350 MG/ML SOLN COMPARISON:  Film from earlier in the same day, CT from 06/11/2018 FINDINGS: Cardiovascular: Thoracic aorta demonstrates atherosclerotic calcifications. No aneurysmal  dilatation in the ascending aorta is seen although stable descending thoracic aortic dilatation is noted with irregular atherosclerotic plaque. Maximum dilatation of the descending thoracic aorta is stable at 4.5 cm similar to that seen on the prior exam. Pacing device is noted and stable. The pulmonary artery shows a  normal branching pattern without intraluminal filling defect to suggest pulmonary embolism. Coronary calcifications are noted. Calcifications of the aortic valve are noted. Mediastinum/Nodes: Thoracic inlet is within normal limits. No hilar or mediastinal adenopathy is noted. The esophagus as visualized is within normal limits. Lungs/Pleura: The lungs are well aerated bilaterally. No focal confluent infiltrate is seen. Patchy scarring is noted in the right upper lobe improved from the prior exam. Mild emphysematous changes are noted. Patchy scarring is noted within the lungs bilaterally. No focal confluent infiltrate is seen. Upper Abdomen: The right kidney is shrunken but stable from the prior exam. The left kidney is somewhat hypertrophied. The remainder of the upper abdomen is within normal limits. Musculoskeletal: Degenerative changes of the thoracic spine are noted. No acute rib abnormality is noted. No compression deformity is seen. Review of the MIP images confirms the above findings. IMPRESSION: No evidence of pulmonary emboli. 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 Slight improvement of the scarring in the right upper lobe when compared with the prior exam. Some patchy opacities are noted throughout both lungs stable from the previous study. Aortic Atherosclerosis (ICD10-I70.0) and Emphysema (ICD10-J43.9). Electronically Signed   By: Inez Catalina M.D.   On: 05/08/2019 02:39   Dg Chest Portable 1 View  Result Date: 05/07/2019 CLINICAL DATA:  Hemoptysis and shortness of breath EXAM: PORTABLE CHEST 1 VIEW COMPARISON:  09/10/2018 FINDINGS: Cardiac shadow is stable. Defibrillator is again noted and stable. Aortic calcifications are seen. No focal infiltrate or sizable effusion is seen. Mild chronic interstitial changes are again noted and stable. Degenerative changes of the thoracic spine are seen. IMPRESSION: Chronic changes without acute abnormality.  Electronically Signed   By: Inez Catalina M.D.   On: 05/07/2019 23:59     Antimicrobials:   Ceftriaxone azithromycin day 2   Subjective: Patient reports coughing up some brown expectorant, as well as yellow mucousy expectorant did not report any additional bright red blood  Objective: Vitals:   05/08/19 1815 05/08/19 2029 05/09/19 0417 05/09/19 0741  BP: (!) 144/69 129/73 (!) 145/74 (!) 156/74  Pulse: 81 69 67 76  Resp: 16 16 18 20   Temp: 97.9 F (36.6 C) 97.7 F (36.5 C) 98.2 F (36.8 C) (!) 97.4 F (36.3 C)  TempSrc: Oral Oral Oral Oral  SpO2: 99% 97% 96% 97%  Weight:      Height:        Intake/Output Summary (Last 24 hours) at 05/09/2019 1439 Last data filed at 05/09/2019 1051 Gross per 24 hour  Intake --  Output 850 ml  Net -850 ml   Filed Weights   05/07/19 2325  Weight: 86.2 kg    Examination:  General exam: Appears calm and comfortable  Respiratory system: Rhonchi bilaterally, trace wheezing right greater than left. Cardiovascular system: S1 & S2 heard, RRR. No JVD, murmurs, rubs, gallops or clicks. No pedal edema. Gastrointestinal system: Abdomen is nondistended, soft and nontender. No organomegaly or masses felt. Normal bowel sounds heard. Central nervous system: Alert and oriented. No focal neurological deficits. Extremities: Warm well perfused, no edema Skin: No rashes, lesions or ulcers Psychiatry: Judgement and insight appear normal. Mood & affect appropriate.  Data Reviewed: I have personally reviewed following labs and imaging studies  CBC: Recent Labs  Lab 05/07/19 2331 05/08/19 0653 05/09/19 0458  WBC 11.1* 11.3* 11.7*  NEUTROABS  --   --  10.2*  HGB 13.8 13.2 12.6*  HCT 43.6 40.4 39.5  MCV 89.5 86.5 88.4  PLT 147* 192 892   Basic Metabolic Panel: Recent Labs  Lab 05/08/19 0030 05/08/19 0653 05/09/19 0458  NA 143 141 141  K 4.0 4.1 3.8  CL 102 102 107  CO2 31 29 25   GLUCOSE 135* 140* 153*  BUN 33* 34* 44*  CREATININE  1.54* 1.52* 1.45*  CALCIUM 9.0 8.8* 8.5*   GFR: Estimated Creatinine Clearance: 46.2 mL/min (A) (by C-G formula based on SCr of 1.45 mg/dL (H)). Liver Function Tests: Recent Labs  Lab 05/08/19 0030  AST 15  ALT 13  ALKPHOS 76  BILITOT 0.5  PROT 6.9  ALBUMIN 3.6   No results for input(s): LIPASE, AMYLASE in the last 168 hours. No results for input(s): AMMONIA in the last 168 hours. Coagulation Profile: No results for input(s): INR, PROTIME in the last 168 hours. Cardiac Enzymes: No results for input(s): CKTOTAL, CKMB, CKMBINDEX, TROPONINI in the last 168 hours. BNP (last 3 results) Recent Labs    09/10/18 0957  PROBNP 5,918*   HbA1C: No results for input(s): HGBA1C in the last 72 hours. CBG: No results for input(s): GLUCAP in the last 168 hours. Lipid Profile: No results for input(s): CHOL, HDL, LDLCALC, TRIG, CHOLHDL, LDLDIRECT in the last 72 hours. Thyroid Function Tests: No results for input(s): TSH, T4TOTAL, FREET4, T3FREE, THYROIDAB in the last 72 hours. Anemia Panel: No results for input(s): VITAMINB12, FOLATE, FERRITIN, TIBC, IRON, RETICCTPCT in the last 72 hours. Sepsis Labs: No results for input(s): PROCALCITON, LATICACIDVEN in the last 168 hours.  Recent Results (from the past 240 hour(s))  Expectorated sputum assessment w rflx to resp cult     Status: None   Collection Time: 05/08/19  6:53 AM   Specimen: Sputum  Result Value Ref Range Status   Specimen Description SPUTUM  Final   Special Requests NONE  Final   Sputum evaluation   Final    THIS SPECIMEN IS ACCEPTABLE FOR SPUTUM CULTURE Performed at [facility], [address] [place], [place] [address]    Report Status 05/08/2019 FINAL  Final  Culture, respiratory     Status: None (Preliminary result)   Collection Time: 05/08/19  6:53 AM   Specimen: SPU  Result Value Ref Range Status   Specimen Description   Final    SPUTUM Performed at [facility], [address],  [place], [place] [address]    Special Requests   Final    NONE Reflexed from [id number] Performed at [facility], [address] [place], [place] [address]    Gram Stain   Final    RARE WBC PRESENT, PREDOMINANTLY PMN RARE GRAM POSITIVE COCCI RARE GRAM NEGATIVE RODS    Culture   Final    CULTURE REINCUBATED FOR BETTER GROWTH Performed at [facility], [place] [address] [place], [place] [address]    Report Status PENDING  Incomplete  SARS CORONAVIRUS 2 (TAT 6-24 HRS) Nasopharyngeal Nasopharyngeal Swab     Status: None   Collection Time: 05/08/19  6:54 AM   Specimen: Nasopharyngeal Swab  Result Value Ref Range Status   SARS Coronavirus 2 NEGATIVE NEGATIVE Final    Comment: (NOTE) SARS-CoV-2 target nucleic acids are NOT DETECTED.  The SARS-CoV-2 RNA is generally detectable in upper and lower respiratory specimens during the acute phase of infection. Negative results do not preclude SARS-CoV-2 infection, do not rule out co-infections with other pathogens, and should not be used as the sole basis for treatment or other patient management decisions. Negative results must be combined with clinical observations, patient history, and epidemiological information. The expected result is Negative. Fact Sheet for Patients: SugarRoll.be Fact Sheet for Healthcare Providers: https://www.woods-mathews.com/ This test is not yet approved or cleared by the [place] FDA and  has been authorized for detection and/or diagnosis of SARS-CoV-2 by FDA under an Emergency Use Authorization (EUA). This EUA will remain  in effect (meaning this test can be used) for the duration of the COVID-19 declaration under Section 56 4(b)(1) of the Act, 21 U.S.C. section 360bbb-3(b)(1), unless the authorization is terminated or revoked sooner. Performed at [facility], [place] [address] [place], [place] [address]          Radiology Studies: Ct Angio  Chest Pe W And/or Wo Contrast  Result Date: 05/08/2019 CLINICAL DATA:  Hemoptysis and chest pain EXAM: CT ANGIOGRAPHY CHEST WITH CONTRAST TECHNIQUE: Multidetector CT imaging of the chest was performed using the standard protocol during bolus administration of intravenous contrast. Multiplanar CT image reconstructions and MIPs were obtained to evaluate the vascular anatomy. CONTRAST:  96mL OMNIPAQUE IOHEXOL 350 MG/ML SOLN COMPARISON:  Film from earlier in the same day, CT from 06/11/2018 FINDINGS: Cardiovascular: Thoracic aorta demonstrates atherosclerotic calcifications. No aneurysmal dilatation in the ascending aorta is seen although stable descending thoracic aortic dilatation is noted with irregular atherosclerotic plaque. Maximum dilatation of the descending thoracic aorta is stable at 4.5 cm similar to that seen on the prior exam. Pacing device is noted and stable. The pulmonary artery shows a normal branching pattern without intraluminal filling defect to suggest pulmonary embolism. Coronary calcifications are noted. Calcifications of the aortic valve are noted. Mediastinum/Nodes: Thoracic inlet is within normal limits. No hilar or mediastinal adenopathy is noted. The esophagus as visualized is within normal limits. Lungs/Pleura: The lungs are well aerated bilaterally. No focal confluent infiltrate is seen. Patchy scarring is noted in the right upper lobe improved from the prior exam. Mild emphysematous changes are noted. Patchy scarring is noted within the lungs bilaterally. No focal confluent infiltrate is seen. Upper Abdomen: The right kidney is shrunken but stable from the prior exam. The left kidney is somewhat hypertrophied. The remainder of the upper abdomen is within normal limits. Musculoskeletal: Degenerative changes of the thoracic spine are noted. No acute rib abnormality is noted. No compression deformity is seen. Review of the MIP images confirms the above findings. IMPRESSION: No evidence of  pulmonary emboli. 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 Slight improvement of the scarring in the right upper lobe when compared with the prior exam. Some patchy opacities are noted throughout both lungs stable from the previous study. Aortic Atherosclerosis (ICD10-I70.0) and Emphysema (ICD10-J43.9). Electronically Signed   By: Inez Catalina M.D.   On: 05/08/2019 02:39   Dg Chest Portable 1 View  Result Date: 05/07/2019 CLINICAL DATA:  Hemoptysis and shortness of breath EXAM: PORTABLE CHEST 1 VIEW COMPARISON:  09/10/2018 FINDINGS: Cardiac shadow is stable. Defibrillator is again noted and stable. Aortic calcifications are seen. No focal infiltrate or sizable effusion is seen. Mild chronic interstitial changes are again noted and stable. Degenerative changes of the thoracic spine are seen. IMPRESSION: Chronic changes without acute abnormality.  Electronically Signed   By: Inez Catalina M.D.   On: 05/07/2019 23:59        Scheduled Meds:  amLODipine  5 mg Oral Daily   carvedilol  12.5 mg Oral BID WC   guaiFENesin  600 mg Oral BID   influenza vaccine adjuvanted  0.5 mL Intramuscular Tomorrow-1000   Ipratropium-Albuterol  1 puff Inhalation QID   methylPREDNISolone (SOLU-MEDROL) injection  60 mg Intravenous Q8H   pravastatin  40 mg Oral QHS   sacubitril-valsartan  1 tablet Oral BID   sertraline  50 mg Oral Daily   Continuous Infusions:  azithromycin 500 mg (05/09/19 0415)   cefTRIAXone (ROCEPHIN)  IV 1 g (05/09/19 0518)     LOS: 1 day    Time spent: 8 min    Nicolette Bang, MD [facility]  If 7PM-7AM, please contact night-coverage  05/09/2019, 2:39 PM

## 2019-05-09 NOTE — Progress Notes (Signed)
Pt still with some dark brown hemoptysis.

## 2019-05-09 NOTE — Plan of Care (Signed)
Patient denies pain or discomfort.  Son at bedside.  Call bell in reach.  Will continue to monitor.

## 2019-05-09 NOTE — Progress Notes (Signed)
Spoke with Rufina Falco NP. Pt with a 17 beat run of wide QRS. NP to place new orders.

## 2019-05-09 NOTE — Consult Note (Signed)
Pulmonary Medicine          Date: 05/09/2019,   MRN# [id number] Phillip Allison. Sep 18, 1940     AdmissionWeight: 86.2 kg                 CurrentWeight: 86.2 kg      CHIEF COMPLAINT:   Nonmassive hemoptysis   HISTORY OF PRESENT ILLNESS   Is a pleasant 78 year old male with a history of abdominal aneurysm, coronary artery disease, CKD, ischemic cardiomyopathy, peripheral vascular disease heart failure with reduced EF, arrhythmias, COPD, chronic hypoxemia with hypercapnia on 2 L/min nasal cannula at home, GERD, dyslipidemia, chronic atrial fibrillation, depression who came in due to cough with nonmassive hemoptysis as well as worsening dyspnea and wheezing however denied fevers subjectively denied chills or constitutional symptoms.  Patient denies hematemesis or melanotic stools he presented with pallor as well as worsening hypoxemia noted to be at 83% oxygen saturation.  In the ER he was normotensive hypoxemic with CKD hemoglobin was stable at 13.8.  He had chest imaging with chest x-ray as well as CT PE protocol which is included in pictorial documentation below.  Patient states that he had actually gained approximately 7 pounds over the past few months.  He does report significant fatigue to the point where ADLs have been a assisted by son including simple chores and meal preparation over the last 6 months with worsening over the last 1 month where patient has been minimally ambulatory due to severe malaise.  I had discussed the possibility of evolving malignancy and patient and family will discuss if they are interested in any additional evaluation or therapy in this scenario.   PAST MEDICAL HISTORY   Past Medical History:  Diagnosis Date  . CHF (congestive heart failure) ([place])   . COPD (chronic obstructive pulmonary disease) ([place])   . Depression   . Emphysema lung ([place])   . Hypercholesterolemia   . Hypertension   . Myocardial infarction ([place])   . On home  oxygen therapy    2 liters at night and as needed  . Pneumonia    in the past  . Presence of permanent cardiac pacemaker   . Shortness of breath dyspnea    with exertion     SURGICAL HISTORY   Past Surgical History:  Procedure Laterality Date  . ABDOMINAL AORTIC ANEURYSM REPAIR    . ANGIOPLASTY     left lower extremity  . CATARACT EXTRACTION W/PHACO Right 12/18/2015   Procedure: CATARACT EXTRACTION PHACO AND INTRAOCULAR LENS PLACEMENT (I[facility]);  Surgeon: Estill Cotta, MD;  Location: [facility] ORS;  Service: Ophthalmology;  Laterality: Right;  [place] 01:58 AP% 23.5 CDE 50.91 fluid pack lot # [id number]  . INSERT / REPLACE / REMOVE PACEMAKER    . PACEMAKER PLACEMENT    . TONSILLECTOMY       FAMILY HISTORY   Family History  Problem Relation Age of Onset  . Psoriasis Mother   . Heart attack Father      SOCIAL HISTORY   Social History   Tobacco Use  . Smoking status: Former Smoker    Packs/day: 0.75    Years: 54.00    Pack years: 40.50    Types: Cigarettes    Quit date: 2014    Years since quitting: 6.8  . Smokeless tobacco: Never Used  . Tobacco comment: 2014?  Substance Use Topics  . Alcohol use: No  . Drug use: No     MEDICATIONS    Home  Medication:    Current Medication:  Current Facility-Administered Medications:  .  acetaminophen (TYLENOL) tablet 650 mg, 650 mg, Oral, Q6H PRN **OR** acetaminophen (TYLENOL) suppository 650 mg, 650 mg, Rectal, Q6H PRN, Mansy, Jan A, MD .  albuterol (PROVENTIL) (2.5 MG/3ML) 0.083% nebulizer solution 2.5 mg, 2.5 mg, Nebulization, Q6H PRN, Mansy, Jan A, MD .  amLODipine (NORVASC) tablet 5 mg, 5 mg, Oral, Daily, Mansy, Jan A, MD, 5 mg at 05/09/19 0916 .  azithromycin (ZITHROMAX) 500 mg in sodium chloride 0.9 % 250 mL IVPB, 500 mg, Intravenous, Q24H, Mansy, Jan A, MD, Last Rate: 250 mL/hr at 05/09/19 0415, 500 mg at 05/09/19 0415 .  carvedilol (COREG) tablet 12.5 mg, 12.5 mg, Oral, BID WC, Mansy, Jan A, MD, 12.5 mg at 05/09/19  0804 .  cefTRIAXone (ROCEPHIN) 1 g in sodium chloride 0.9 % 100 mL IVPB, 1 g, Intravenous, Q24H, Mansy, Jan A, MD, Last Rate: 200 mL/hr at 05/09/19 0518, 1 g at 05/09/19 0518 .  chlorpheniramine-HYDROcodone (TUSSIONEX) 10-8 MG/5ML suspension 5 mL, 5 mL, Oral, Q12H PRN, Mansy, Jan A, MD .  guaiFENesin (MUCINEX) 12 hr tablet 600 mg, 600 mg, Oral, BID, Mansy, Jan A, MD, 600 mg at 05/09/19 0916 .  influenza vaccine adjuvanted (FLUAD) injection 0.5 mL, 0.5 mL, Intramuscular, Tomorrow-1000, Spongberg, Audie Pinto, MD .  Ipratropium-Albuterol (COMBIVENT) respimat 1 puff, 1 puff, Inhalation, QID, [name], RPH, 1 puff at 05/09/19 1259 .  magnesium hydroxide (MILK OF MAGNESIA) suspension 30 mL, 30 mL, Oral, Daily PRN, Mansy, Jan A, MD .  methylPREDNISolone sodium succinate (SOLU-MEDROL) 125 mg/2 mL injection 60 mg, 60 mg, Intravenous, Q8H, Mansy, Jan A, MD, 60 mg at 05/09/19 1458 .  ondansetron (ZOFRAN) tablet 4 mg, 4 mg, Oral, Q6H PRN **OR** ondansetron (ZOFRAN) injection 4 mg, 4 mg, Intravenous, Q6H PRN, Mansy, Jan A, MD .  pravastatin (PRAVACHOL) tablet 40 mg, 40 mg, Oral, QHS, Mansy, Jan A, MD, 40 mg at 05/08/19 2101 .  sacubitril-valsartan (ENTRESTO) 24-26 mg per tablet, 1 tablet, Oral, BID, Mansy, Jan A, MD, 1 tablet at 05/09/19 0915 .  sertraline (ZOLOFT) tablet 50 mg, 50 mg, Oral, Daily, Mansy, Jan A, MD, 50 mg at 05/09/19 0916 .  traZODone (DESYREL) tablet 25 mg, 25 mg, Oral, QHS PRN, Mansy, Arvella Merles, MD    ALLERGIES   Patient has no known allergies.     REVIEW OF SYSTEMS    Review of Systems:  Gen:  Denies  fever, sweats, chills weigh loss  HEENT: Denies blurred vision, double vision, ear pain, eye pain, hearing loss, nose bleeds, sore throat Cardiac:  No dizziness, chest pain or heaviness, chest tightness,edema Resp:   Admits to wheezing and hemoptysis with worsening dyspnea and hypoxemia Gi: Denies swallowing difficulty, stomach pain, nausea or vomiting, diarrhea,  constipation, bowel incontinence Gu:  Denies bladder incontinence, burning urine Ext:   Denies Joint pain, stiffness or swelling Skin: Denies  skin rash, easy bruising or bleeding or hives Endoc:  Denies polyuria, polydipsia , polyphagia or weight change Psych:   Denies depression, insomnia or hallucinations   Other:  All other systems negative   VS: BP (!) 156/74 (BP Location: Right Arm)   Pulse 76   Temp (!) 97.4 F (36.3 C) (Oral)   Resp 20   Ht 5\' 11"  (1.803 m)   Wt 86.2 kg   SpO2 97%   BMI 26.50 kg/m      PHYSICAL EXAM    GENERAL:NAD, no fevers, chills, no weakness no fatigue  HEAD: Normocephalic, atraumatic.  EYES: Pupils equal, round, reactive to light. Extraocular muscles intact. No scleral icterus.  MOUTH: Moist mucosal membrane. Dentition intact. No abscess noted.  EAR, NOSE, THROAT: Clear without exudates. No external lesions.  NECK: Supple. No thyromegaly. No nodules. No JVD.  PULMONARY: Bilateral expiratory wheezing CARDIOVASCULAR: S1 and S2. Regular rate and rhythm. No murmurs, rubs, or gallops. No edema. Pedal pulses 2+ bilaterally.  GASTROINTESTINAL: Soft, nontender, nondistended. No masses. Positive bowel sounds. No hepatosplenomegaly.  MUSCULOSKELETAL: No swelling, clubbing, or edema. Range of motion full in all extremities.  NEUROLOGIC: Cranial nerves II through XII are intact. No gross focal neurological deficits. Sensation intact. Reflexes intact.  SKIN: No ulceration, lesions, rashes, or cyanosis. Skin warm and dry. Turgor intact.  PSYCHIATRIC: Mood, affect within normal limits. The patient is awake, alert and oriented x 3. Insight, judgment intact.       IMAGING    Ct Angio Chest Pe W And/or Wo Contrast  Result Date: 05/08/2019 CLINICAL DATA:  Hemoptysis and chest pain EXAM: CT ANGIOGRAPHY CHEST WITH CONTRAST TECHNIQUE: Multidetector CT imaging of the chest was performed using the standard protocol during bolus administration of intravenous  contrast. Multiplanar CT image reconstructions and MIPs were obtained to evaluate the vascular anatomy. CONTRAST:  54mL OMNIPAQUE IOHEXOL 350 MG/ML SOLN COMPARISON:  Film from earlier in the same day, CT from 06/11/2018 FINDINGS: Cardiovascular: Thoracic aorta demonstrates atherosclerotic calcifications. No aneurysmal dilatation in the ascending aorta is seen although stable descending thoracic aortic dilatation is noted with irregular atherosclerotic plaque. Maximum dilatation of the descending thoracic aorta is stable at 4.5 cm similar to that seen on the prior exam. Pacing device is noted and stable. The pulmonary artery shows a normal branching pattern without intraluminal filling defect to suggest pulmonary embolism. Coronary calcifications are noted. Calcifications of the aortic valve are noted. Mediastinum/Nodes: Thoracic inlet is within normal limits. No hilar or mediastinal adenopathy is noted. The esophagus as visualized is within normal limits. Lungs/Pleura: The lungs are well aerated bilaterally. No focal confluent infiltrate is seen. Patchy scarring is noted in the right upper lobe improved from the prior exam. Mild emphysematous changes are noted. Patchy scarring is noted within the lungs bilaterally. No focal confluent infiltrate is seen. Upper Abdomen: The right kidney is shrunken but stable from the prior exam. The left kidney is somewhat hypertrophied. The remainder of the upper abdomen is within normal limits. Musculoskeletal: Degenerative changes of the thoracic spine are noted. No acute rib abnormality is noted. No compression deformity is seen. Review of the MIP images confirms the above findings. IMPRESSION: No evidence of pulmonary emboli. 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 Slight improvement of the scarring in the right upper lobe when compared with the prior exam. Some patchy opacities are noted throughout both lungs stable from the previous  study. Aortic Atherosclerosis (ICD10-I70.0) and Emphysema (ICD10-J43.9). Electronically Signed   By: Inez Catalina M.D.   On: 05/08/2019 02:39   Dg Chest Portable 1 View  Result Date: 05/07/2019 CLINICAL DATA:  Hemoptysis and shortness of breath EXAM: PORTABLE CHEST 1 VIEW COMPARISON:  09/10/2018 FINDINGS: Cardiac shadow is stable. Defibrillator is again noted and stable. Aortic calcifications are seen. No focal infiltrate or sizable effusion is seen. Mild chronic interstitial changes are again noted and stable. Degenerative changes of the thoracic spine are seen. IMPRESSION: Chronic changes without acute abnormality. Electronically Signed   By: Inez Catalina M.D.   On: 05/07/2019 23:59  ASSESSMENT/PLAN   Nonmassive hemoptysis CT chest without venous pulmonary thromboembolism -Abnormal area of the right lung at the apex with spiculated margins cannot rule out malignant focus-patient with high pretest probability for lung cancer -No clinical signs or radiographic evidence of pneumonia -Medical management with typical COPD care path and antibiotics for now -Additional discourse regarding goals of care  Centrilobular emphysema with COPD and chronic hypoxemia -agree with empiric antibiotic regimen - agree with solumedrol  - Combivent MDI q6hPRN -COPD care path -IS at bedside     Thank you Dr Wyonia Hough  for allowing me to participate in the care of this patient.   Patient/Family are satisfied with care plan and all questions have been answered.  This document was prepared using Dragon voice recognition software and may include unintentional dictation errors.     Ottie Glazier, M.D.  Division of [place]

## 2019-05-10 LAB — CULTURE, RESPIRATORY W GRAM STAIN: Culture: NORMAL

## 2019-05-10 LAB — BASIC METABOLIC PANEL
Anion gap: 9 (ref 5–15)
BUN: 62 mg/dL — ABNORMAL HIGH (ref 8–23)
CO2: 25 mmol/L (ref 22–32)
Calcium: 8.5 mg/dL — ABNORMAL LOW (ref 8.9–10.3)
Chloride: 106 mmol/L (ref 98–111)
Creatinine, Ser: 1.65 mg/dL — ABNORMAL HIGH (ref 0.61–1.24)
GFR calc Af Amer: 46 mL/min — ABNORMAL LOW (ref >60)
GFR calc non Af Amer: 40 mL/min — ABNORMAL LOW (ref >60)
Glucose, Bld: 155 mg/dL — ABNORMAL HIGH (ref 70–99)
Potassium: 4.3 mmol/L (ref 3.5–5.1)
Sodium: 140 mmol/L (ref 135–145)

## 2019-05-10 LAB — MAGNESIUM: Magnesium: 2.8 mg/dL — ABNORMAL HIGH (ref 1.7–2.4)

## 2019-05-10 LAB — PHOSPHORUS: Phosphorus: 5.2 mg/dL — ABNORMAL HIGH (ref 2.5–4.6)

## 2019-05-10 MED ORDER — POTASSIUM PHOSPHATES 15 MMOLE/5ML IV SOLN: 20.0000 mmol | Freq: Once | INTRAVENOUS | Status: DC

## 2019-05-10 MED ORDER — FUROSEMIDE 10 MG/ML IJ SOLN
20.0000 mg | Freq: Once | INTRAMUSCULAR | Status: AC
  Administered 2019-05-10: 20 mg via INTRAVENOUS
  Filled 2019-05-10: qty 4

## 2019-05-10 MED ORDER — LEVOFLOXACIN 500 MG PO TABS
500.0000 mg | ORAL_TABLET | Freq: Once | ORAL | Status: AC
  Administered 2019-05-10: 500 mg via ORAL
  Filled 2019-05-10: qty 1

## 2019-05-10 MED ORDER — CALCIUM ACETATE (PHOS BINDER) 667 MG PO CAPS
667.0000 mg | ORAL_CAPSULE | Freq: Two times a day (BID) | ORAL | Status: AC
  Administered 2019-05-10 (×2): 667 mg via ORAL
  Filled 2019-05-10 (×2): qty 1

## 2019-05-10 MED ORDER — LEVOFLOXACIN 500 MG PO TABS: 500.0000 mg | ORAL_TABLET | Freq: Every day | ORAL | Status: DC

## 2019-05-10 MED ORDER — LEVOFLOXACIN 500 MG PO TABS
250.0000 mg | ORAL_TABLET | Freq: Every day | ORAL | Status: DC
  Administered 2019-05-11 – 2019-05-13 (×3): 250 mg via ORAL
  Filled 2019-05-10 (×3): qty 1

## 2019-05-10 MED ORDER — PREDNISONE 50 MG PO TABS
50.0000 mg | ORAL_TABLET | Freq: Every day | ORAL | Status: DC
  Administered 2019-05-11 – 2019-05-13 (×3): 50 mg via ORAL
  Filled 2019-05-10 (×3): qty 1

## 2019-05-10 NOTE — Consult Note (Signed)
Pulmonary Medicine          Date: 05/10/2019,   MRN# [id number] Phillip Allison. 09-14-1940     AdmissionWeight: 86.2 kg                 CurrentWeight: 86.2 kg      CHIEF COMPLAINT:   Nonmassive hemoptysis   SUBJECTIVE   Patient feels better today. We have weaned O2 down to 3L/min and plan to wean to 2 today.  Wheezing has improved with only minimal exp wheezing b/l now.  Plan to diurese x1 with 20 lasix, OOB with PT and discuss discharge plan with Dr Wyonia Hough with close f/u to pulmonary for further mgt of non-massive hemoptysisi, COPD and poss Lung CA.   He is bradycardic today HR 35-40 no symptoms. Asked RN to hold Coreg, concerned it is recirculating in system due to CKD.     He   PAST MEDICAL HISTORY   Past Medical History:  Diagnosis Date   CHF (congestive heart failure) ([facility])    COPD (chronic obstructive pulmonary disease) ([facility])    Depression    Emphysema lung ([facility])    Hypercholesterolemia    Hypertension    Myocardial infarction ([place])    On home oxygen therapy    2 liters at night and as needed   Pneumonia    in the past   Presence of permanent cardiac pacemaker    Shortness of breath dyspnea    with exertion     SURGICAL HISTORY   Past Surgical History:  Procedure Laterality Date   ABDOMINAL AORTIC ANEURYSM REPAIR     ANGIOPLASTY     left lower extremity   CATARACT EXTRACTION W/PHACO Right 12/18/2015   Procedure: CATARACT EXTRACTION PHACO AND INTRAOCULAR LENS PLACEMENT ([place]);  Surgeon: Estill Cotta, MD;  Location: [facility] ORS;  Service: Ophthalmology;  Laterality: Right;  [place] 01:58 AP% 23.5 CDE 50.91 fluid pack lot # [id number]   INSERT / REPLACE / REMOVE PACEMAKER     PACEMAKER PLACEMENT     TONSILLECTOMY       FAMILY HISTORY   Family History  Problem Relation Age of Onset   Psoriasis Mother    Heart attack Father      SOCIAL HISTORY   Social History   Tobacco Use   Smoking status:  Former Smoker    Packs/day: 0.75    Years: 54.00    Pack years: 40.50    Types: Cigarettes    Quit date: 2014    Years since quitting: 6.8   Smokeless tobacco: Never Used   Tobacco comment: 2014?  Substance Use Topics   Alcohol use: No   Drug use: No     MEDICATIONS    Home Medication:    Current Medication:  Current Facility-Administered Medications:    acetaminophen (TYLENOL) tablet 650 mg, 650 mg, Oral, Q6H PRN **OR** acetaminophen (TYLENOL) suppository 650 mg, 650 mg, Rectal, Q6H PRN, Mansy, Jan A, MD   albuterol (PROVENTIL) (2.5 MG/3ML) 0.083% nebulizer solution 2.5 mg, 2.5 mg, Nebulization, Q6H PRN, Mansy, Jan A, MD   amLODipine (NORVASC) tablet 5 mg, 5 mg, Oral, Daily, Mansy, Jan A, MD, 5 mg at 05/09/19 0916   azithromycin (ZITHROMAX) 500 mg in sodium chloride 0.9 % 250 mL IVPB, 500 mg, Intravenous, Q24H, Mansy, Jan A, MD, Last Rate: 250 mL/hr at 05/10/19 0406, 500 mg at 05/10/19 0406   carvedilol (COREG) tablet 12.5 mg, 12.5 mg, Oral, BID WC, Mansy, Arvella Merles, MD,  12.5 mg at 05/09/19 1657   cefTRIAXone (ROCEPHIN) 1 g in sodium chloride 0.9 % 100 mL IVPB, 1 g, Intravenous, Q24H, Mansy, Jan A, MD, Last Rate: 200 mL/hr at 05/10/19 0323, 1 g at 05/10/19 1700   chlorpheniramine-HYDROcodone (TUSSIONEX) 10-8 MG/5ML suspension 5 mL, 5 mL, Oral, Q12H PRN, Mansy, Jan A, MD   furosemide (LASIX) injection 20 mg, 20 mg, Intravenous, Once, Ottie Glazier, MD   guaiFENesin (MUCINEX) 12 hr tablet 600 mg, 600 mg, Oral, BID, Mansy, Jan A, MD, 600 mg at 05/09/19 2107   influenza vaccine adjuvanted (FLUAD) injection 0.5 mL, 0.5 mL, Intramuscular, Tomorrow-1000, Spongberg, Audie Pinto, MD   Ipratropium-Albuterol (COMBIVENT) respimat 1 puff, 1 puff, Inhalation, QID, Shanlever, Pierce Crane, RPH, 1 puff at 05/09/19 1931   magnesium hydroxide (MILK OF MAGNESIA) suspension 30 mL, 30 mL, Oral, Daily PRN, Mansy, Jan A, MD   methylPREDNISolone sodium succinate (SOLU-MEDROL) 125 mg/2 mL  injection 60 mg, 60 mg, Intravenous, Q8H, Mansy, Jan A, MD, 60 mg at 05/10/19 0518   ondansetron (ZOFRAN) tablet 4 mg, 4 mg, Oral, Q6H PRN **OR** ondansetron (ZOFRAN) injection 4 mg, 4 mg, Intravenous, Q6H PRN, Mansy, Jan A, MD   potassium PHOSPHATE 20 mmol in dextrose 5 % 500 mL infusion, 20 mmol, Intravenous, Once, Jourdyn Hasler, MD   pravastatin (PRAVACHOL) tablet 40 mg, 40 mg, Oral, QHS, Mansy, Jan A, MD, 40 mg at 05/09/19 2107   sacubitril-valsartan (ENTRESTO) 24-26 mg per tablet, 1 tablet, Oral, BID, Mansy, Jan A, MD, 1 tablet at 05/09/19 2107   sertraline (ZOLOFT) tablet 50 mg, 50 mg, Oral, Daily, Mansy, Jan A, MD, 50 mg at 05/09/19 0916   traZODone (DESYREL) tablet 25 mg, 25 mg, Oral, QHS PRN, Mansy, Arvella Merles, MD    ALLERGIES   Patient has no known allergies.     REVIEW OF SYSTEMS    Review of Systems:  Gen:  Denies  fever, sweats, chills weigh loss  HEENT: Denies blurred vision, double vision, ear pain, eye pain, hearing loss, nose bleeds, sore throat Cardiac:  No dizziness, chest pain or heaviness, chest tightness,edema Resp:   Admits to wheezing and hemoptysis with worsening dyspnea and hypoxemia Gi: Denies swallowing difficulty, stomach pain, nausea or vomiting, diarrhea, constipation, bowel incontinence Gu:  Denies bladder incontinence, burning urine Ext:   Denies Joint pain, stiffness or swelling Skin: Denies  skin rash, easy bruising or bleeding or hives Endoc:  Denies polyuria, polydipsia , polyphagia or weight change Psych:   Denies depression, insomnia or hallucinations   Other:  All other systems negative   VS: BP (!) 133/53    Pulse (!) 41    Temp 98.3 F (36.8 C) (Oral)    Resp 17    Ht 5\' 11"  (1.803 m)    Wt 86.2 kg    SpO2 99%    BMI 26.50 kg/m      PHYSICAL EXAM    GENERAL:NAD, no fevers, chills, no weakness no fatigue HEAD: Normocephalic, atraumatic.  EYES: Pupils equal, round, reactive to light. Extraocular muscles intact. No scleral  icterus.  MOUTH: Moist mucosal membrane. Dentition intact. No abscess noted.  EAR, NOSE, THROAT: Clear without exudates. No external lesions.  NECK: Supple. No thyromegaly. No nodules. No JVD.  PULMONARY: Bilateral expiratory wheezing CARDIOVASCULAR: S1 and S2. Regular rate and rhythm. No murmurs, rubs, or gallops. No edema. Pedal pulses 2+ bilaterally.  GASTROINTESTINAL: Soft, nontender, nondistended. No masses. Positive bowel sounds. No hepatosplenomegaly.  MUSCULOSKELETAL: No swelling, clubbing, or edema. Range of  motion full in all extremities.  NEUROLOGIC: Cranial nerves II through XII are intact. No gross focal neurological deficits. Sensation intact. Reflexes intact.  SKIN: No ulceration, lesions, rashes, or cyanosis. Skin warm and dry. Turgor intact.  PSYCHIATRIC: Mood, affect within normal limits. The patient is awake, alert and oriented x 3. Insight, judgment intact.       IMAGING    Ct Angio Chest Pe W And/or Wo Contrast  Result Date: 05/08/2019 CLINICAL DATA:  Hemoptysis and chest pain EXAM: CT ANGIOGRAPHY CHEST WITH CONTRAST TECHNIQUE: Multidetector CT imaging of the chest was performed using the standard protocol during bolus administration of intravenous contrast. Multiplanar CT image reconstructions and MIPs were obtained to evaluate the vascular anatomy. CONTRAST:  47mL OMNIPAQUE IOHEXOL 350 MG/ML SOLN COMPARISON:  Film from earlier in the same day, CT from 06/11/2018 FINDINGS: Cardiovascular: Thoracic aorta demonstrates atherosclerotic calcifications. No aneurysmal dilatation in the ascending aorta is seen although stable descending thoracic aortic dilatation is noted with irregular atherosclerotic plaque. Maximum dilatation of the descending thoracic aorta is stable at 4.5 cm similar to that seen on the prior exam. Pacing device is noted and stable. The pulmonary artery shows a normal branching pattern without intraluminal filling defect to suggest pulmonary embolism. Coronary  calcifications are noted. Calcifications of the aortic valve are noted. Mediastinum/Nodes: Thoracic inlet is within normal limits. No hilar or mediastinal adenopathy is noted. The esophagus as visualized is within normal limits. Lungs/Pleura: The lungs are well aerated bilaterally. No focal confluent infiltrate is seen. Patchy scarring is noted in the right upper lobe improved from the prior exam. Mild emphysematous changes are noted. Patchy scarring is noted within the lungs bilaterally. No focal confluent infiltrate is seen. Upper Abdomen: The right kidney is shrunken but stable from the prior exam. The left kidney is somewhat hypertrophied. The remainder of the upper abdomen is within normal limits. Musculoskeletal: Degenerative changes of the thoracic spine are noted. No acute rib abnormality is noted. No compression deformity is seen. Review of the MIP images confirms the above findings. IMPRESSION: No evidence of pulmonary emboli. 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 Slight improvement of the scarring in the right upper lobe when compared with the prior exam. Some patchy opacities are noted throughout both lungs stable from the previous study. Aortic Atherosclerosis (ICD10-I70.0) and Emphysema (ICD10-J43.9). Electronically Signed   By: Inez Catalina M.D.   On: 05/08/2019 02:39   Dg Chest Portable 1 View  Result Date: 05/07/2019 CLINICAL DATA:  Hemoptysis and shortness of breath EXAM: PORTABLE CHEST 1 VIEW COMPARISON:  09/10/2018 FINDINGS: Cardiac shadow is stable. Defibrillator is again noted and stable. Aortic calcifications are seen. No focal infiltrate or sizable effusion is seen. Mild chronic interstitial changes are again noted and stable. Degenerative changes of the thoracic spine are seen. IMPRESSION: Chronic changes without acute abnormality. Electronically Signed   By: Inez Catalina M.D.   On: 05/07/2019 23:59      ASSESSMENT/PLAN   Nonmassive  hemoptysis CT chest without venous pulmonary thromboembolism -Abnormal area of the right lung at the apex with spiculated margins cannot rule out malignant focus-patient with high pretest probability for lung cancer -No clinical signs or radiographic evidence of pneumonia -Medical management with typical COPD care path and antibiotics for now -Additional discourse regarding goals of care  Centrilobular emphysema with COPD and chronic hypoxemia -agree with empiric antibiotic regimen - agree with solumedrol - transition to PO prednisone 50 with taper down by 10mg   per day.  - Combivent MDI q6hPRN -COPD care path -IS at bedside     Thank you Dr Wyonia Hough  for allowing me to participate in the care of this patient.   Patient/Family are satisfied with care plan and all questions have been answered.  This document was prepared using Dragon voice recognition software and may include unintentional dictation errors.     Ottie Glazier, M.D.  Division of [place]

## 2019-05-10 NOTE — Progress Notes (Signed)
PT Cancellation Note  Patient Details Name: Phillip Allison. MRN: [id number] DOB: 10/14/1940   Cancelled Treatment:    Reason Eval/Treat Not Completed: Patient at procedure or test/unavailable(Chart reviewed, RN consulted- pt pending cardiology consult and RN reports cardiology preparing to see pt. Will attempt PT evaluation again at later date/time.)  4:20 PM, 05/10/19 Etta Grandchild, PT, DPT Physical Therapist - [facility]  [phone] ([place])    Alfard Cochrane C 05/10/2019, 4:20 PM

## 2019-05-10 NOTE — Progress Notes (Signed)
D: Pt alert and oriented. BP decreases w/movement. Physician aware, physician in room. Oxygen being weaned slowly down to 2L as per Physician's order. Pt denies experiencing any pain at this time.  A: Scheduled medications administered to pt, per MD orders. Support and encouragement provided. Frequent verbal contact made.   R: No adverse drug reactions noted.. Pt complaint with medications and treatment plan. Pt interacts well with staff on the unit. Pt is stable at this time, will continue to monitor and provide care for as ordered.

## 2019-05-10 NOTE — Progress Notes (Signed)
hospitalist paged pt with 18 beat run wide QRS. Waiting on call back.

## 2019-05-10 NOTE — Progress Notes (Signed)
PROGRESS NOTE    Phillip Allison.  NFA:[id number] DOB: 06-Oct-1940 DOA: 05/07/2019 PCP: Jerrol Banana., MD   Brief Narrative:  This is a 78 year old white male with a known history of CHF, COPD, depression, hypertension, CAD, and hyperlipidemia who presented to the emergency room with acute onset of excessive cough with subsequent a mopped assist of bright red blood approximately 5 to 10 mL per his report. Patient reported in the ER has been occurring over the last 24 hours. He was found to be hypoxic at 83%.   Assessment & Plan:   Active Problems:   Hypercholesterolemia without hypertriglyceridemia   Hemoptysis   HTN (hypertension)   Depression   1. Non-massive Hemoptysis likely with acute bronchitis and subsequent mild COPD exacerbation. Continue IV Rocephin and Zithromax Hgb 13.8>13.2>12.6 Scheduled and as needed nebs IV steroids Follow-up sputum culture with Gram stain-reincubated, nl resp flora with scattered gpc gnr noted Discussed with pulm, appreciate their input  2. Hypertension.We will continue Entresto, Coreg and amlodipine.  3. Dyslipidemia. Continue statin therapy.  4. Depression. We will continue Zoloft.  5.  Wide-complex tachycardia: Patient had an asymptomatic 18 beat run of V. tach, mag 2.8, potassium 4.3, will monitor an additional day in the hospital  6.  Bradycardia: Patient and episode of heart rate slowing down to the 30s and 40s this morning, Coreg being held. Consulted [place] cards  DVT prophylaxis: SCD/Compression stockings  Code Status: DNR    Code Status Orders  (From admission, onward)         Start     Ordered   05/08/19 0413  Do not attempt resuscitation (DNR)  Continuous    Question Answer Comment  In the event of cardiac or respiratory ARREST Do not call a "code blue"   In the event of cardiac or respiratory ARREST Do not perform Intubation, CPR, defibrillation or ACLS   In the event of cardiac or  respiratory ARREST Use medication by any route, position, wound care, and other measures to relive pain and suffering. May use oxygen, suction and manual treatment of airway obstruction as needed for comfort.      05/08/19 0413        Code Status History    Date Active Date Inactive Code Status Order ID Comments User Context   05/08/2019 0413 05/08/2019 0413 Full Code [id number]  Christel Mormon, MD ED   12/16/2017 0150 12/18/2017 1454 DNR [id number]  Amelia Jo, MD Inpatient   10/08/2017 0243 10/09/2017 1625 DNR [id number]  Arta Silence, MD Inpatient   Advance Care Planning Activity    Advance Directive Documentation     Most Recent Value  Type of Advance Directive  Healthcare Power of Attorney, Living will, Out of facility DNR (pink MOST or yellow form)  Pre-existing out of facility DNR order (yellow form or pink MOST form)  -  "MOST" Form in Place?  -     Family Communication: Discussed with son who was at bedside Disposition Plan:   Patient will be an inpatient for continued IV antibiotics, IV steroids, respiratory support and subspecialty evaluation for asymptomatic V. tach and now bradycardia.  Patient not yet medically stable for discharge  Consults called: Cardiology Admission status: Inpatient   Consultants:   CCM, CARDS  Procedures:  Ct Angio Chest Pe W And/or Wo Contrast  Result Date: 05/08/2019 CLINICAL DATA:  Hemoptysis and chest pain EXAM: CT ANGIOGRAPHY CHEST WITH CONTRAST TECHNIQUE: Multidetector CT imaging of the chest was performed using the  standard protocol during bolus administration of intravenous contrast. Multiplanar CT image reconstructions and MIPs were obtained to evaluate the vascular anatomy. CONTRAST:  45mL OMNIPAQUE IOHEXOL 350 MG/ML SOLN COMPARISON:  Film from earlier in the same day, CT from 06/11/2018 FINDINGS: Cardiovascular: Thoracic aorta demonstrates atherosclerotic calcifications. No aneurysmal dilatation in the ascending aorta is seen although  stable descending thoracic aortic dilatation is noted with irregular atherosclerotic plaque. Maximum dilatation of the descending thoracic aorta is stable at 4.5 cm similar to that seen on the prior exam. Pacing device is noted and stable. The pulmonary artery shows a normal branching pattern without intraluminal filling defect to suggest pulmonary embolism. Coronary calcifications are noted. Calcifications of the aortic valve are noted. Mediastinum/Nodes: Thoracic inlet is within normal limits. No hilar or mediastinal adenopathy is noted. The esophagus as visualized is within normal limits. Lungs/Pleura: The lungs are well aerated bilaterally. No focal confluent infiltrate is seen. Patchy scarring is noted in the right upper lobe improved from the prior exam. Mild emphysematous changes are noted. Patchy scarring is noted within the lungs bilaterally. No focal confluent infiltrate is seen. Upper Abdomen: The right kidney is shrunken but stable from the prior exam. The left kidney is somewhat hypertrophied. The remainder of the upper abdomen is within normal limits. Musculoskeletal: Degenerative changes of the thoracic spine are noted. No acute rib abnormality is noted. No compression deformity is seen. Review of the MIP images confirms the above findings. IMPRESSION: No evidence of pulmonary emboli. 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 Slight improvement of the scarring in the right upper lobe when compared with the prior exam. Some patchy opacities are noted throughout both lungs stable from the previous study. Aortic Atherosclerosis (ICD10-I70.0) and Emphysema (ICD10-J43.9). Electronically Signed   By: Inez Catalina M.D.   On: 05/08/2019 02:39   Dg Chest Portable 1 View  Result Date: 05/07/2019 CLINICAL DATA:  Hemoptysis and shortness of breath EXAM: PORTABLE CHEST 1 VIEW COMPARISON:  09/10/2018 FINDINGS: Cardiac shadow is stable. Defibrillator is again noted and  stable. Aortic calcifications are seen. No focal infiltrate or sizable effusion is seen. Mild chronic interstitial changes are again noted and stable. Degenerative changes of the thoracic spine are seen. IMPRESSION: Chronic changes without acute abnormality. Electronically Signed   By: Inez Catalina M.D.   On: 05/07/2019 23:59     Antimicrobials:   Ceftriaxone and azithromycin day 3   Subjective: Patient reports no additional hemoptysis, Did endorse mucousy  Objective: Vitals:   05/10/19 0820 05/10/19 0825 05/10/19 1016 05/10/19 1100  BP: 132/86  (!) 133/53   Pulse: (!) 36 (!) 45 (!) 41 (!) 38  Resp: 17     Temp: 98.3 F (36.8 C)     TempSrc: Oral     SpO2: 98%  99%   Weight:      Height:        Intake/Output Summary (Last 24 hours) at 05/10/2019 1116 Last data filed at 05/10/2019 1012 Gross per 24 hour  Intake 360 ml  Output 650 ml  Net -290 ml   Filed Weights   05/07/19 2325  Weight: 86.2 kg    Examination:  General exam: Appears calm and comfortable  Respiratory system: Mild wheezing bilaterally no rhonchi. Cardiovascular system: Regular rate and rhythm on my evaluation, later reported to bradycardia by staff  gastrointestinal system: Abdomen is nondistended, soft and nontender. No organomegaly or masses felt. Normal bowel sounds heard. Central nervous system: Alert and oriented.  No focal neurological deficits. Extremities: Warm well perfused no edema Skin: No rashes, lesions or ulcers Psychiatry: Judgement and insight appear normal. Mood & affect appropriate.     Data Reviewed: I have personally reviewed following labs and imaging studies  CBC: Recent Labs  Lab 05/07/19 2331 05/08/19 0653 05/09/19 0458  WBC 11.1* 11.3* 11.7*  NEUTROABS  --   --  10.2*  HGB 13.8 13.2 12.6*  HCT 43.6 40.4 39.5  MCV 89.5 86.5 88.4  PLT 147* 192 683   Basic Metabolic Panel: Recent Labs  Lab 05/08/19 0030 05/08/19 0653 05/09/19 0458 05/10/19 0754  NA 143 141 141  140  K 4.0 4.1 3.8 4.3  CL 102 102 107 106  CO2 31 29 25 25   GLUCOSE 135* 140* 153* 155*  BUN 33* 34* 44* 62*  CREATININE 1.54* 1.52* 1.45* 1.65*  CALCIUM 9.0 8.8* 8.5* 8.5*  MG  --   --   --  2.8*   GFR: Estimated Creatinine Clearance: 40.6 mL/min (A) (by C-G formula based on SCr of 1.65 mg/dL (H)). Liver Function Tests: Recent Labs  Lab 05/08/19 0030  AST 15  ALT 13  ALKPHOS 76  BILITOT 0.5  PROT 6.9  ALBUMIN 3.6   No results for input(s): LIPASE, AMYLASE in the last 168 hours. No results for input(s): AMMONIA in the last 168 hours. Coagulation Profile: No results for input(s): INR, PROTIME in the last 168 hours. Cardiac Enzymes: No results for input(s): CKTOTAL, CKMB, CKMBINDEX, TROPONINI in the last 168 hours. BNP (last 3 results) Recent Labs    09/10/18 0957  PROBNP 5,918*   HbA1C: No results for input(s): HGBA1C in the last 72 hours. CBG: No results for input(s): GLUCAP in the last 168 hours. Lipid Profile: No results for input(s): CHOL, HDL, LDLCALC, TRIG, CHOLHDL, LDLDIRECT in the last 72 hours. Thyroid Function Tests: No results for input(s): TSH, T4TOTAL, FREET4, T3FREE, THYROIDAB in the last 72 hours. Anemia Panel: No results for input(s): VITAMINB12, FOLATE, FERRITIN, TIBC, IRON, RETICCTPCT in the last 72 hours. Sepsis Labs: No results for input(s): PROCALCITON, LATICACIDVEN in the last 168 hours.  Recent Results (from the past 240 hour(s))  Expectorated sputum assessment w rflx to resp cult     Status: None   Collection Time: 05/08/19  6:53 AM   Specimen: Sputum  Result Value Ref Range Status   Specimen Description SPUTUM  Final   Special Requests NONE  Final   Sputum evaluation   Final    THIS SPECIMEN IS ACCEPTABLE FOR SPUTUM CULTURE Performed at [facility], [address] [place], [place] [address]    Report Status 05/08/2019 FINAL  Final  Culture, respiratory     Status: None (Preliminary result)   Collection Time: 05/08/19   6:53 AM   Specimen: SPU  Result Value Ref Range Status   Specimen Description   Final    SPUTUM Performed at [facility], [address], [place], [place] [address]    Special Requests   Final    NONE Reflexed from [id number] Performed at [facility], [address] [place], [place] [address]    Gram Stain   Final    RARE WBC PRESENT, PREDOMINANTLY PMN RARE GRAM POSITIVE COCCI RARE GRAM NEGATIVE RODS    Culture   Final    FEW Consistent with normal respiratory flora. Performed at [facility], [place] [address], [place], [place] [address]    Report Status PENDING  Incomplete  SARS  CORONAVIRUS 2 (TAT 6-24 HRS) Nasopharyngeal Nasopharyngeal Swab     Status: None   Collection Time: 05/08/19  6:54 AM   Specimen: Nasopharyngeal Swab  Result Value Ref Range Status   SARS Coronavirus 2 NEGATIVE NEGATIVE Final    Comment: (NOTE) SARS-CoV-2 target nucleic acids are NOT DETECTED. The SARS-CoV-2 RNA is generally detectable in upper and lower respiratory specimens during the acute phase of infection. Negative results do not preclude SARS-CoV-2 infection, do not rule out co-infections with other pathogens, and should not be used as the sole basis for treatment or other patient management decisions. Negative results must be combined with clinical observations, patient history, and epidemiological information. The expected result is Negative. Fact Sheet for Patients: SugarRoll.be Fact Sheet for Healthcare Providers: https://www.woods-mathews.com/ This test is not yet approved or cleared by the [place] FDA and  has been authorized for detection and/or diagnosis of SARS-CoV-2 by FDA under an Emergency Use Authorization (EUA). This EUA will remain  in effect (meaning this test can be used) for the duration of the COVID-19 declaration under Section 56 4(b)(1) of the Act, 21 U.S.C. section 360bbb-3(b)(1), unless the  authorization is terminated or revoked sooner. Performed at [facility], [place] [address] [place], [place] [address]          Radiology Studies: No results found.      Scheduled Meds: . amLODipine  5 mg Oral Daily  . carvedilol  12.5 mg Oral BID WC  . furosemide  20 mg Intravenous Once  . guaiFENesin  600 mg Oral BID  . influenza vaccine adjuvanted  0.5 mL Intramuscular Tomorrow-1000  . Ipratropium-Albuterol  1 puff Inhalation QID  . levofloxacin  500 mg Oral Once   Followed by  . [START ON 05/11/2019] levofloxacin  250 mg Oral Daily  . pravastatin  40 mg Oral QHS  . [START ON 05/11/2019] predniSONE  50 mg Oral Q breakfast  . sacubitril-valsartan  1 tablet Oral BID  . sertraline  50 mg Oral Daily   Continuous Infusions:   LOS: 2 days    Time spent: [place], MD [facility]  If 7PM-7AM, please contact night-coverage  05/10/2019, 11:16 AM

## 2019-05-10 NOTE — Consult Note (Addendum)
[facility] Cardiology  CARDIOLOGY CONSULT NOTE  Patient ID: Phillip Allison. MRN: [id number] DOB/AGE: 77-Jul-1942 77 y.o.  Admit date: 05/07/2019 Referring Physician Smyer Primary Physician Gasper Lloyd Cardiologist Nehemiah Massed Reason for Consultation bradycardia, nonsustained VT  HPI: 78 year old gentleman referred for evaluation of bradycardia and nonsustained ventricular tachycardia. The patient has a history of ischemic cardiomyopathy with EF 25%, status post dual chamber ICD placement in 2014, coronary artery disease per cardiac catheterization in 2011, which revealed 100% stenosis RCA with collaterals, 50% proximal LAD, and 60% ramus, chronic systolic CHF, COPD with chronic hypoxemia on 2L home oxygen, hypertension, and hyperlipidemia. The patient presented to [facility] on 05[facility]07[facility]2019 for acute onset of cough with hemoptysis, felt to be secondary to acute bronchitis and subsequent mild COPD exacerbation, started on antibiotics and IV steroids. The patient's heart rate was noted to be in the 30s-40s when he sat up in bed per pulse oximeter, but not documented on rhythm strips. There was also concern for an 18-beat run of ventricular tachycardia. Review of rhythm strips reveals occasional PVCs, at times in a trigeminal pattern. The patient was asymptomatic. He denies chest pain. He does have chronic shortness of breath and cough. He denies peripheral edema. He denies presyncope or syncope, but does report positional dizziness that promptly resolves after a few seconds. Bedside device interrogation today reveals normal dual-chamber ICD function, with low voltage at 2.62V, at ERI, pacing at <0.1% of the time, with one episode of non-sustained ventricular tachycardia that occurred February 18, 2019, with no delivered shocks. His labs are notable for magnesium 2.8, phosphorus 5.2, which is currently being managed by pharmacy.   Review of systems complete and found to be negative unless listed above      Past Medical History:  Diagnosis Date  . CHF (congestive heart failure) ([place])   . COPD (chronic obstructive pulmonary disease) ([place])   . Depression   . Emphysema lung ([place])   . Hypercholesterolemia   . Hypertension   . Myocardial infarction ([place])   . On home oxygen therapy    2 liters at night and as needed  . Pneumonia    in the past  . Presence of permanent cardiac pacemaker   . Shortness of breath dyspnea    with exertion    Past Surgical History:  Procedure Laterality Date  . ABDOMINAL AORTIC ANEURYSM REPAIR    . ANGIOPLASTY     left lower extremity  . CATARACT EXTRACTION W/PHACO Right 12/18/2015   Procedure: CATARACT EXTRACTION PHACO AND INTRAOCULAR LENS PLACEMENT (IOC);  Surgeon: Estill Cotta, MD;  Location: [facility] ORS;  Service: Ophthalmology;  Laterality: Right;  [place] 01:58 AP% 23.5 CDE 50.91 fluid pack lot # [id number]  . INSERT / REPLACE / REMOVE PACEMAKER    . PACEMAKER PLACEMENT    . TONSILLECTOMY      Medications Prior to Admission  Medication Sig Dispense Refill Last Dose  . albuterol (PROVENTIL) (2.5 MG/3ML) 0.083% nebulizer solution USE 1 VIAL VIA NEBULIZER EVERY 6 HOURS AS NEEDED FOR WHEEZING OR SHORTNESS OF BREATH (Patient taking differently: Take 2.5 mg by nebulization every 6 (six) hours as needed for wheezing or shortness of breath. ) 180 mL 3 prn at prn  . amLODipine (NORVASC) 5 MG tablet Take 1 tablet (5 mg total) by mouth daily. 30 tablet 1 05/07/2019 at Unknown time  . aspirin 81 MG tablet Take 81 mg by mouth at bedtime.    05/07/2019 at Unknown time  . carvedilol (COREG) 12.5 MG tablet Take  12.5 mg by mouth 2 (two) times daily with a meal.    05/07/2019 at 2200  . ENTRESTO 24-26 MG Take 1 tablet by mouth 2 (two) times daily.   05/07/2019 at 2200  . pravastatin (PRAVACHOL) 40 MG tablet Take 1 tablet (40 mg total) by mouth at bedtime. 90 tablet 3 05/07/2019 at Unknown time  . sertraline (ZOLOFT) 50 MG tablet Take 1 tablet (50 mg total) by mouth  daily. 30 tablet 11 05/07/2019 at Unknown time   Social History   Socioeconomic History  . Marital status: Widowed    Spouse name: Not on file  . Number of children: 3  . Years of education: Not on file  . Highest education level: Associate degree: occupational, Hotel manager, or vocational program  Occupational History  . Occupation: retired  Scientific laboratory technician  . Financial resource strain: Not hard at all  . Food insecurity    Worry: Never true    Inability: Never true  . Transportation needs    Medical: No    Non-medical: No  Tobacco Use  . Smoking status: Former Smoker    Packs/day: 0.75    Years: 54.00    Pack years: 40.50    Types: Cigarettes    Quit date: 2014    Years since quitting: 6.8  . Smokeless tobacco: Never Used  . Tobacco comment: 2014?  Substance and Sexual Activity  . Alcohol use: No  . Drug use: No  . Sexual activity: Not Currently  Lifestyle  . Physical activity    Days per week: 0 days    Minutes per session: 0 min  . Stress: Only a little  Relationships  . Social Herbalist on phone: Patient refused    Gets together: Patient refused    Attends religious service: Patient refused    Active member of club or organization: Patient refused    Attends meetings of clubs or organizations: Patient refused    Relationship status: Patient refused  . Intimate partner violence    Fear of current or ex partner: Patient refused    Emotionally abused: Patient refused    Physically abused: Patient refused    Forced sexual activity: Patient refused  Other Topics Concern  . Not on file  Social History Narrative  . Not on file    Family History  Problem Relation Age of Onset  . Psoriasis Mother   . Heart attack Father       Review of systems complete and found to be negative unless listed above      PHYSICAL EXAM  General: Well developed, well nourished, elderly gentleman lying flat in bed in no acute distress HEENT:  Normocephalic and  atramatic Neck:  No JVD.  Lungs: distant breath sounds throughout, inspiratory wheezing, no crackles, on oxygen via [place] Heart: HRRR . Normal S1 and S2 without gallops or murmurs.  Abdomen: no obvious distention Msk:  Gait not assessed. Normal strength and tone for age. Extremities: No clubbing, cyanosis or edema.   Neuro: Alert and oriented X 3. Psych:  Good affect, responds appropriately  Labs:   Lab Results  Component Value Date   WBC 11.7 (H) 05/09/2019   HGB 12.6 (L) 05/09/2019   HCT 39.5 05/09/2019   MCV 88.4 05/09/2019   PLT 195 05/09/2019    Recent Labs  Lab 05/08/19 0030  05/10/19 0754  NA 143   < > 140  K 4.0   < > 4.3  CL 102   < >  106  CO2 31   < > 25  BUN 33*   < > 62*  CREATININE 1.54*   < > 1.65*  CALCIUM 9.0   < > 8.5*  PROT 6.9  --   --   BILITOT 0.5  --   --   ALKPHOS 76  --   --   ALT 13  --   --   AST 15  --   --   GLUCOSE 135*   < > 155*   < > = values in this interval not displayed.   Lab Results  Component Value Date   TROPONINI 0.04 ([place]) 12/16/2017    Lab Results  Component Value Date   CHOL 135 07/01/2017   CHOL 144 07/15/2016   CHOL 140 07/13/2015   Lab Results  Component Value Date   HDL 42 07/01/2017   HDL 35 (L) 07/15/2016   HDL 32 (L) 07/13/2015   Lab Results  Component Value Date   LDLCALC 76 07/01/2017   LDLCALC 90 07/15/2016   LDLCALC 88 07/13/2015   Lab Results  Component Value Date   TRIG 84 07/01/2017   TRIG 93 07/15/2016   TRIG 98 07/13/2015   Lab Results  Component Value Date   CHOLHDL 3.2 07/01/2017   No results found for: LDLDIRECT    Radiology: Ct Angio Chest Pe W And/or Wo Contrast  Result Date: 05/08/2019 CLINICAL DATA:  Hemoptysis and chest pain EXAM: CT ANGIOGRAPHY CHEST WITH CONTRAST TECHNIQUE: Multidetector CT imaging of the chest was performed using the standard protocol during bolus administration of intravenous contrast. Multiplanar CT image reconstructions and MIPs were obtained to evaluate  the vascular anatomy. CONTRAST:  25mL OMNIPAQUE IOHEXOL 350 MG/ML SOLN COMPARISON:  Film from earlier in the same day, CT from 06/11/2018 FINDINGS: Cardiovascular: Thoracic aorta demonstrates atherosclerotic calcifications. No aneurysmal dilatation in the ascending aorta is seen although stable descending thoracic aortic dilatation is noted with irregular atherosclerotic plaque. Maximum dilatation of the descending thoracic aorta is stable at 4.5 cm similar to that seen on the prior exam. Pacing device is noted and stable. The pulmonary artery shows a normal branching pattern without intraluminal filling defect to suggest pulmonary embolism. Coronary calcifications are noted. Calcifications of the aortic valve are noted. Mediastinum/Nodes: Thoracic inlet is within normal limits. No hilar or mediastinal adenopathy is noted. The esophagus as visualized is within normal limits. Lungs/Pleura: The lungs are well aerated bilaterally. No focal confluent infiltrate is seen. Patchy scarring is noted in the right upper lobe improved from the prior exam. Mild emphysematous changes are noted. Patchy scarring is noted within the lungs bilaterally. No focal confluent infiltrate is seen. Upper Abdomen: The right kidney is shrunken but stable from the prior exam. The left kidney is somewhat hypertrophied. The remainder of the upper abdomen is within normal limits. Musculoskeletal: Degenerative changes of the thoracic spine are noted. No acute rib abnormality is noted. No compression deformity is seen. Review of the MIP images confirms the above findings. IMPRESSION: No evidence of pulmonary emboli. 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 Slight improvement of the scarring in the right upper lobe when compared with the prior exam. Some patchy opacities are noted throughout both lungs stable from the previous study. Aortic Atherosclerosis (ICD10-I70.0) and Emphysema (ICD10-J43.9).  Electronically Signed   By: Inez Catalina M.D.   On: 05/08/2019 02:39   Dg Chest Portable 1 View  Result Date: 05/07/2019 CLINICAL DATA:  Hemoptysis and  shortness of breath EXAM: PORTABLE CHEST 1 VIEW COMPARISON:  09/10/2018 FINDINGS: Cardiac shadow is stable. Defibrillator is again noted and stable. Aortic calcifications are seen. No focal infiltrate or sizable effusion is seen. Mild chronic interstitial changes are again noted and stable. Degenerative changes of the thoracic spine are seen. IMPRESSION: Chronic changes without acute abnormality. Electronically Signed   By: Inez Catalina M.D.   On: 05/07/2019 23:59    EKG: sinus rhythm, 76 bpm  ASSESSMENT AND PLAN:  1. Bradycardia, per pulse oximeter, not detected on rhythm strips, could have been secondary to PVCs, with normal functioning pacemaker.  2. Ventricular tachycardia; telemetry strips reviewed and reveal PVCs, occasionally in trigeminal pattern. Bedside ICD interrogation reveals 1 episode of NS-VT, which occurred in August 2020, with no recurrence, with no delivered shocks. 3. 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 Scheduled for 2D echocardiogram as outpatient this month to evaluate LV function. 4. Chronic systolic CHF, no sign of fluid overload, currently on Entresto and carvedilol. 5. Bronchitis and COPD exacerbation, on steroids and antibiotics 6. Dual chamber ICD at ERI, change-out needed  Recommendations: 1. Resume carvedilol to prevent reflex tachycardia 2. Continue Entresto  3. Obtain 2D echocardiogram  4. Plan for surgical ICD change-out on Wednesday; may be discharged same-day. 5. Continue to monitor on telemetry    Signed: Sharolyn Douglas 05/10/2019, 2:18 PM   Discussed with Dr. Saralyn Pilar who agrees with plan.

## 2019-05-10 NOTE — Consult Note (Signed)
PHARMACY CONSULT NOTE - FOLLOW UP  Pharmacy Consult for Electrolyte Monitoring and Replacement   Recent Labs: Potassium (mmol/L)  Date Value  05/10/2019 4.3  07/21/2012 4.4   Magnesium (mg/dL)  Date Value  05/10/2019 2.8 (H)  12/19/2011 1.9   Calcium (mg/dL)  Date Value  05/10/2019 8.5 (L)   Calcium, Total (mg/dL)  Date Value  07/21/2012 9.0   Albumin (g/dL)  Date Value  05/08/2019 3.6  09/10/2018 3.4 (L)  12/19/2011 2.9 (L)   Phosphorus (mg/dL)  Date Value  05/10/2019 5.2 (H)  12/19/2011 3.4   Sodium (mmol/L)  Date Value  05/10/2019 140  09/10/2018 141  07/21/2012 138     Assessment: Pharmacy consulted to monitor and replace electrolytes in 77yo patient.   Goal of Therapy:  Electrolytes WNL  Plan:  K 4.3, Mg 2.8, Phos 5.2 -Spoke with hospitalist, will order Calcium acetate 667mg  PO x 2 doses.  -Holding off on further replacement currently. -Will recheck levels with AM labs tomorrow morning.  Pearla Dubonnet ,PharmD Clinical Pharmacist 05/10/2019 2:36 PM

## 2019-05-11 ENCOUNTER — Inpatient Hospital Stay
Admit: 2019-05-11 | Discharge: 2019-05-11 | Disposition: A | Discharge status: Home / Self Care | Payer: Medicare Other | Attending: Physician Assistant | Admitting: Physician Assistant

## 2019-05-11 DIAGNOSIS — R001 Bradycardia, unspecified: Secondary | ICD-10-CM

## 2019-05-11 DIAGNOSIS — I472 Ventricular tachycardia: Secondary | ICD-10-CM

## 2019-05-11 LAB — SURGICAL PCR SCREEN
MRSA, PCR: NEGATIVE
Staphylococcus aureus: NEGATIVE

## 2019-05-11 LAB — BASIC METABOLIC PANEL
Anion gap: 7 (ref 5–15)
BUN: 71 mg/dL — ABNORMAL HIGH (ref 8–23)
CO2: 27 mmol/L (ref 22–32)
Calcium: 8.6 mg/dL — ABNORMAL LOW (ref 8.9–10.3)
Chloride: 106 mmol/L (ref 98–111)
Creatinine, Ser: 1.74 mg/dL — ABNORMAL HIGH (ref 0.61–1.24)
GFR calc Af Amer: 43 mL/min — ABNORMAL LOW (ref >60)
GFR calc non Af Amer: 37 mL/min — ABNORMAL LOW (ref >60)
Glucose, Bld: 130 mg/dL — ABNORMAL HIGH (ref 70–99)
Potassium: 4.2 mmol/L (ref 3.5–5.1)
Sodium: 140 mmol/L (ref 135–145)

## 2019-05-11 LAB — ECHOCARDIOGRAM COMPLETE
Height: 71 in
Weight: 3040 oz

## 2019-05-11 LAB — MAGNESIUM: Magnesium: 2.7 mg/dL — ABNORMAL HIGH (ref 1.7–2.4)

## 2019-05-11 LAB — PHOSPHORUS: Phosphorus: 4.2 mg/dL (ref 2.5–4.6)

## 2019-05-11 MED ORDER — CEFAZOLIN SODIUM-DEXTROSE 2-4 GM/100ML-% IV SOLN
2.0000 g | INTRAVENOUS | Status: DC
  Filled 2019-05-11: qty 100

## 2019-05-11 NOTE — Progress Notes (Signed)
PROGRESS NOTE    Phillip Allison.  TOI:[id number] DOB: June 03, 1941 DOA: 05/07/2019 PCP: Jerrol Banana., MD   Brief Narrative:  This is a 78 year old white male with a known history of CHF, COPD, depression, hypertension, CAD, and hyperlipidemia who presented to the emergency room with acute onset of excessive cough with subsequent a mopped assist of bright red blood approximately 5 to 10 mL per his report. Patient reported in the ER has been occurring over the last 24 hours. He was found to be hypoxic at 83%.   Assessment & Plan:   Active Problems:   Hypercholesterolemia without hypertriglyceridemia   Hemoptysis   HTN (hypertension)   Depression   1. Non-massive Hemoptysis likely with acute bronchitis and subsequent mild COPD exacerbation. Continue IV Rocephin and Zithromax Hgb 13.8>13.2>12.6 Scheduled and as needed nebs IV steroids-taper on discharge Follow-up sputum culture with Gram stain-reincubated, nl resp flora with scattered gpc gnr noted Discussed with pulm, appreciate their consult  2. Hypertension.We will continue Entresto, Coreg and amlodipine.  3. Dyslipidemia. Continue statin therapy.  4. Depression. We will continue Zoloft.  5.  Wide-complex tachycardia: Patient had an asymptomatic 18 beat run of V. Tach 11/16, mag 2.8, potassium 4.3, seen by cardiology as below  6.  Bradycardia: Patient and episode of heart rate slowing down to the 30s and 40s this morning, Coreg was held. Consulted [facility] cards-plan for ICD change out Wednesday, November 18, they added Coreg back   DVT prophylaxis: SCD/Compression stockings  Code Status: DNR    Code Status Orders  (From admission, onward)         Start     Ordered   05/08/19 0413  Do not attempt resuscitation (DNR)  Continuous    Question Answer Comment  In the event of cardiac or respiratory ARREST Do not call a "code blue"   In the event of cardiac or respiratory ARREST Do not perform  Intubation, CPR, defibrillation or ACLS   In the event of cardiac or respiratory ARREST Use medication by any route, position, wound care, and other measures to relive pain and suffering. May use oxygen, suction and manual treatment of airway obstruction as needed for comfort.      05/08/19 0413        Code Status History    Date Active Date Inactive Code Status Order ID Comments User Context   05/08/2019 0413 05/08/2019 0413 Full Code [id number]  Christel Mormon, MD ED   12/16/2017 0150 12/18/2017 1454 DNR [id number]  Amelia Jo, MD Inpatient   10/08/2017 0243 10/09/2017 1625 DNR [id number]  Arta Silence, MD Inpatient   Advance Care Planning Activity    Advance Directive Documentation     Most Recent Value  Type of Advance Directive  Healthcare Power of Attorney, Living will, Out of facility DNR (pink MOST or yellow form)  Pre-existing out of facility DNR order (yellow form or pink MOST form)  -  "MOST" Form in Place?  -     Family Communication: Discussed with son who is bedside as well as patient Disposition Plan:   Patient will be an inpatient for continued IV antibiotics, IV steroids, respiratory support and subspecialty evaluation 2/2 asymptomatic V. tach and bradycardia with plan for ICD change out November 18. Patient not yet medically stable for discharge  Consults called: None Admission status: Inpatient   Consultants:   Pulmonology, cardiology  Procedures:  Ct Angio Chest Pe W And/or Wo Contrast  Result Date: 05/08/2019 CLINICAL DATA:  Hemoptysis and chest pain EXAM: CT ANGIOGRAPHY CHEST WITH CONTRAST TECHNIQUE: Multidetector CT imaging of the chest was performed using the standard protocol during bolus administration of intravenous contrast. Multiplanar CT image reconstructions and MIPs were obtained to evaluate the vascular anatomy. CONTRAST:  46mL OMNIPAQUE IOHEXOL 350 MG/ML SOLN COMPARISON:  Film from earlier in the same day, CT from 06/11/2018 FINDINGS:  Cardiovascular: Thoracic aorta demonstrates atherosclerotic calcifications. No aneurysmal dilatation in the ascending aorta is seen although stable descending thoracic aortic dilatation is noted with irregular atherosclerotic plaque. Maximum dilatation of the descending thoracic aorta is stable at 4.5 cm similar to that seen on the prior exam. Pacing device is noted and stable. The pulmonary artery shows a normal branching pattern without intraluminal filling defect to suggest pulmonary embolism. Coronary calcifications are noted. Calcifications of the aortic valve are noted. Mediastinum/Nodes: Thoracic inlet is within normal limits. No hilar or mediastinal adenopathy is noted. The esophagus as visualized is within normal limits. Lungs/Pleura: The lungs are well aerated bilaterally. No focal confluent infiltrate is seen. Patchy scarring is noted in the right upper lobe improved from the prior exam. Mild emphysematous changes are noted. Patchy scarring is noted within the lungs bilaterally. No focal confluent infiltrate is seen. Upper Abdomen: The right kidney is shrunken but stable from the prior exam. The left kidney is somewhat hypertrophied. The remainder of the upper abdomen is within normal limits. Musculoskeletal: Degenerative changes of the thoracic spine are noted. No acute rib abnormality is noted. No compression deformity is seen. Review of the MIP images confirms the above findings. IMPRESSION: No evidence of pulmonary emboli. 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 Slight improvement of the scarring in the right upper lobe when compared with the prior exam. Some patchy opacities are noted throughout both lungs stable from the previous study. Aortic Atherosclerosis (ICD10-I70.0) and Emphysema (ICD10-J43.9). Electronically Signed   By: Inez Catalina M.D.   On: 05/08/2019 02:39   Dg Chest Portable 1 View  Result Date: 05/07/2019 CLINICAL DATA:  Hemoptysis and  shortness of breath EXAM: PORTABLE CHEST 1 VIEW COMPARISON:  09/10/2018 FINDINGS: Cardiac shadow is stable. Defibrillator is again noted and stable. Aortic calcifications are seen. No focal infiltrate or sizable effusion is seen. Mild chronic interstitial changes are again noted and stable. Degenerative changes of the thoracic spine are seen. IMPRESSION: Chronic changes without acute abnormality. Electronically Signed   By: Inez Catalina M.D.   On: 05/07/2019 23:59     Antimicrobials:   Ceftriaxone and azithromycin day 4    Subjective: Patient reports no additional hemoptysis overnight Endorsed some coughing with productive yellow sputum  Objective: Vitals:   05/10/19 1934 05/11/19 0527 05/11/19 0803 05/11/19 0848  BP: 124/83 119/71 (!) 140/52   Pulse: 63 73 68 92  Resp: 18 18 18    Temp: 97.6 F (36.4 C) 97.6 F (36.4 C) 98.3 F (36.8 C)   TempSrc: Oral Oral Oral   SpO2: 99% 98% 99% 92%  Weight:      Height:        Intake/Output Summary (Last 24 hours) at 05/11/2019 1314 Last data filed at 05/11/2019 0900 Gross per 24 hour  Intake 360 ml  Output 925 ml  Net -565 ml   Filed Weights   05/07/19 2325  Weight: 86.2 kg    Examination:  General exam: Appears calm and comfortable  Respiratory system: Mild wheezing bilaterally improved from prior exam, no rhonchi. Cardiovascular system: Regular rate and rhythm  on my evaluation, gastrointestinal system: Abdomen is nondistended, soft and nontender. No organomegaly or masses felt. Normal bowel sounds heard. Central nervous system: Alert and oriented. No focal neurological deficits. Extremities: Warm well perfused no edema Skin: No rashes, lesions or ulcers Psychiatry: Judgement and insight appear normal. Mood & affect appropriate.     Data Reviewed: I have personally reviewed following labs and imaging studies  CBC: Recent Labs  Lab 05/07/19 2331 05/08/19 0653 05/09/19 0458  WBC 11.1* 11.3* 11.7*  NEUTROABS  --   --   10.2*  HGB 13.8 13.2 12.6*  HCT 43.6 40.4 39.5  MCV 89.5 86.5 88.4  PLT 147* 192 376   Basic Metabolic Panel: Recent Labs  Lab 05/08/19 0030 05/08/19 0653 05/09/19 0458 05/10/19 0754 05/11/19 0533  NA 143 141 141 140 140  K 4.0 4.1 3.8 4.3 4.2  CL 102 102 107 106 106  CO2 31 29 25 25 27   GLUCOSE 135* 140* 153* 155* 130*  BUN 33* 34* 44* 62* 71*  CREATININE 1.54* 1.52* 1.45* 1.65* 1.74*  CALCIUM 9.0 8.8* 8.5* 8.5* 8.6*  MG  --   --   --  2.8* 2.7*  PHOS  --   --   --  5.2* 4.2   GFR: Estimated Creatinine Clearance: 38.5 mL/min (A) (by C-G formula based on SCr of 1.74 mg/dL (H)). Liver Function Tests: Recent Labs  Lab 05/08/19 0030  AST 15  ALT 13  ALKPHOS 76  BILITOT 0.5  PROT 6.9  ALBUMIN 3.6   No results for input(s): LIPASE, AMYLASE in the last 168 hours. No results for input(s): AMMONIA in the last 168 hours. Coagulation Profile: No results for input(s): INR, PROTIME in the last 168 hours. Cardiac Enzymes: No results for input(s): CKTOTAL, CKMB, CKMBINDEX, TROPONINI in the last 168 hours. BNP (last 3 results) Recent Labs    09/10/18 0957  PROBNP 5,918*   HbA1C: No results for input(s): HGBA1C in the last 72 hours. CBG: No results for input(s): GLUCAP in the last 168 hours. Lipid Profile: No results for input(s): CHOL, HDL, LDLCALC, TRIG, CHOLHDL, LDLDIRECT in the last 72 hours. Thyroid Function Tests: No results for input(s): TSH, T4TOTAL, FREET4, T3FREE, THYROIDAB in the last 72 hours. Anemia Panel: No results for input(s): VITAMINB12, FOLATE, FERRITIN, TIBC, IRON, RETICCTPCT in the last 72 hours. Sepsis Labs: No results for input(s): PROCALCITON, LATICACIDVEN in the last 168 hours.  Recent Results (from the past 240 hour(s))  Expectorated sputum assessment w rflx to resp cult     Status: None   Collection Time: 05/08/19  6:53 AM   Specimen: Sputum  Result Value Ref Range Status   Specimen Description SPUTUM  Final   Special Requests NONE   Final   Sputum evaluation   Final    THIS SPECIMEN IS ACCEPTABLE FOR SPUTUM CULTURE Performed at [facility], [address] [place], [place] [address]    Report Status 05/08/2019 FINAL  Final  Culture, respiratory     Status: None   Collection Time: 05/08/19  6:53 AM   Specimen: SPU  Result Value Ref Range Status   Specimen Description   Final    SPUTUM Performed at [facility], [address] [place], [place] [address]    Special Requests   Final    NONE Reflexed from [id number] Performed at [facility], [address] [place], [place] [address]    Gram Stain   Final    RARE WBC PRESENT, PREDOMINANTLY  PMN RARE GRAM POSITIVE COCCI RARE GRAM NEGATIVE RODS    Culture   Final    FEW Consistent with normal respiratory flora. Performed at [facility], [place] [address], [place], [place] [address]    Report Status 05/10/2019 FINAL  Final  SARS CORONAVIRUS 2 (TAT 6-24 HRS) Nasopharyngeal Nasopharyngeal Swab     Status: None   Collection Time: 05/08/19  6:54 AM   Specimen: Nasopharyngeal Swab  Result Value Ref Range Status   SARS Coronavirus 2 NEGATIVE NEGATIVE Final    Comment: (NOTE) SARS-CoV-2 target nucleic acids are NOT DETECTED. The SARS-CoV-2 RNA is generally detectable in upper and lower respiratory specimens during the acute phase of infection. Negative results do not preclude SARS-CoV-2 infection, do not rule out co-infections with other pathogens, and should not be used as the sole basis for treatment or other patient management decisions. Negative results must be combined with clinical observations, patient history, and epidemiological information. The expected result is Negative. Fact Sheet for Patients: SugarRoll.be Fact Sheet for Healthcare Providers: https://www.woods-mathews.com/ This test is not yet approved or cleared by the [place] FDA and  has been authorized for detection  and/or diagnosis of SARS-CoV-2 by FDA under an Emergency Use Authorization (EUA). This EUA will remain  in effect (meaning this test can be used) for the duration of the COVID-19 declaration under Section 56 4(b)(1) of the Act, 21 U.S.C. section 360bbb-3(b)(1), unless the authorization is terminated or revoked sooner. Performed at [facility], [place] [address] [place], [place] [address]          Radiology Studies: No results found.      Scheduled Meds: . amLODipine  5 mg Oral Daily  . carvedilol  12.5 mg Oral BID WC  . guaiFENesin  600 mg Oral BID  . influenza vaccine adjuvanted  0.5 mL Intramuscular Tomorrow-1000  . Ipratropium-Albuterol  1 puff Inhalation QID  . levofloxacin  250 mg Oral Daily  . pravastatin  40 mg Oral QHS  . predniSONE  50 mg Oral Q breakfast  . sacubitril-valsartan  1 tablet Oral BID  . sertraline  50 mg Oral Daily   Continuous Infusions:   LOS: 3 days    Time spent: [place]    Nicolette Bang, MD [facility]  If 7PM-7AM, please contact night-coverage  05/11/2019, 1:14 PM

## 2019-05-11 NOTE — Evaluation (Signed)
Physical Therapy Evaluation Patient Details Name: Phillip Allison. MRN: [id number] DOB: 1941-03-23 Today's Date: 05/11/2019   History of Present Illness  Phillip Allison  is a 78 y.o. Caucasian male with a known history of CHF, COPD, depression, hypertension, coronary artery disease and dyslipidemia, who presented to the emergency room with acute onset of excessive cough with subsequent hemoptysis with pure blood that amounted to about 5 to 10 mL, with associated dyspnea and wheezing without fever or chills.  Clinical Impression  Pt admitted with above diagnosis. Pt currently with functional limitations due to the deficits listed below (see "PT Problem List"). Upon entry, pt in bed, awake and agreeable to participate. The pt is alert and oriented x4, pleasant, conversational, and generally a good historian. Pt tolerating AMB outside of room on 2L as per baseline, no desaturation, but pt has DOE. Pt reports he does not typically AMB this much PTA. Pt has some instability of gait upon initiation which improves as he goes along. Functional mobility assessment demonstrates increased effort/time requirements, poor tolerance, but no frank need for physical assistance, whereas the patient performed these at a higher level of independence PTA. Empirically, the patient demonstrates. Pt will benefit from skilled PT intervention to increase independence and safety with basic mobility in preparation for discharge to the venue listed below.       Follow Up Recommendations Home health PT    Equipment Recommendations  None recommended by PT    Recommendations for Other Services       Precautions / Restrictions Precautions Precautions: Fall Restrictions Weight Bearing Restrictions: No      Mobility  Bed Mobility Overal bed mobility: Modified Independent             General bed mobility comments: additional time/effort needed, but no physical assist  Transfers Overall transfer  level: Modified independent Equipment used: None             General transfer comment: additional time/effort needed, but no physical assist  Ambulation/Gait Ambulation/Gait assistance: Supervision Gait Distance (Feet): 240 Feet Assistive device: None Gait Pattern/deviations: Drifts right/left     General Gait Details: some staggering inititally, but then appears steady adn stable. Author manages O2 tank at [employer]Pt has some SOB without desaturation; Reports AMB is baseline but he rarely AMB this far)  Stairs            Wheelchair Mobility    Modified Rankin (Stroke Patients Only)       Balance Overall balance assessment: Modified Independent                                           Pertinent Vitals/Pain Pain Assessment: No/denies pain    Home Living Family/patient expects to be discharged to:: Private residence Living Arrangements: Children(Son [name], retired) Available Help at Discharge: Family Type of Home: House       Home Layout: Laundry or work area in basement(pt lives in [place] finished basement) Home Equipment: None      Prior Function           Comments: PTA modI with ADL; PTA AMB limited but SOB. no falls or clos calls in past 6 months.     Hand Dominance        Extremity/Trunk Assessment   Upper Extremity Assessment Upper Extremity Assessment: Overall WFL for tasks assessed;Generalized weakness    Lower Extremity Assessment  Lower Extremity Assessment: Overall WFL for tasks assessed;Generalized weakness       Communication   Communication: No difficulties;HOH  Cognition Arousal/Alertness: Awake/alert Behavior During Therapy: WFL for tasks assessed/performed Overall Cognitive Status: Within Functional Limits for tasks assessed                                        General Comments      Exercises     Assessment/Plan    PT Assessment Patient needs continued PT services  PT Problem  List Cardiopulmonary status limiting activity;Decreased mobility;Decreased activity tolerance;Decreased balance       PT Treatment Interventions DME instruction;Balance training;Gait training;Stair training;Functional mobility training;Therapeutic activities;Therapeutic exercise;Patient/family education    PT Goals (Current goals can be found in the Care Plan section)  Acute Rehab PT Goals Patient Stated Goal: DC back to home, resume normal activity PT Goal Formulation: With patient Time For Goal Achievement: 05/25/19 Potential to Achieve Goals: Fair    Frequency Min 2X/week   Barriers to discharge        Co-evaluation               AM-PAC PT "6 Clicks" Mobility  Outcome Measure Help needed turning from your back to your side while in a flat bed without using bedrails?: None Help needed moving from lying on your back to sitting on the side of a flat bed without using bedrails?: None Help needed moving to and from a bed to a chair (including a wheelchair)?: A Little Help needed standing up from a chair using your arms (e.g., wheelchair or bedside chair)?: A Little Help needed to walk in hospital room?: A Little Help needed climbing 3-5 steps with a railing? : A Little 6 Click Score: 20    End of Session Equipment Utilized During Treatment: Gait belt Activity Tolerance: Patient tolerated treatment well;No increased pain Patient left: in bed;with call bell/phone within reach;with bed alarm set   PT Visit Diagnosis: Unsteadiness on feet (R26.81);Difficulty in walking, not elsewhere classified (R26.2)    Time: 0802-0821 PT Time Calculation (min) (ACUTE ONLY): 19 min   Charges:   PT Evaluation $PT Eval Moderate Complexity: 1 Mod PT Treatments $Therapeutic Exercise: 8-22 mins        10:29 AM, 05/11/19 Etta Grandchild, PT, DPT Physical Therapist - [facility]  [phone] ([place])   Buccola,Allan C 05/11/2019, 10:27 AM

## 2019-05-11 NOTE — Progress Notes (Signed)
*  PRELIMINARY RESULTS* Echocardiogram 2D Echocardiogram has been performed.  Phillip Allison 05/11/2019, 9:35 AM

## 2019-05-11 NOTE — Progress Notes (Signed)
[facility] Cardiology  SUBJECTIVE: the patient reports feeling better this morning with no chest pain or worsening shortness of breath. He ambulated around the nurses station with physical therapy without chest pain or dizziness. He does experience lightheadedness initially upon standing, which resolves quickly.   Vitals:   05/10/19 1934 05/11/19 0527 05/11/19 0803 05/11/19 0848  BP: 124/83 119/71 (!) 140/52   Pulse: 63 73 68 92  Resp: 18 18 18    Temp: 97.6 F (36.4 C) 97.6 F (36.4 C) 98.3 F (36.8 C)   TempSrc: Oral Oral Oral   SpO2: 99% 98% 99% 92%  Weight:      Height:         Intake/Output Summary (Last 24 hours) at 05/11/2019 1310 Last data filed at 05/11/2019 0900 Gross per 24 hour  Intake 360 ml  Output 925 ml  Net -565 ml      PHYSICAL EXAM   General: Well developed, well nourished, elderly gentleman lying at an incline in bed in no acute distress HEENT:  Normocephalic and atramatic Neck:   No JVD.  Lungs: distant breath sounds throughout, inspiratory wheezing, no crackles, on oxygen via [place] Heart: HRRR . Normal S1 and S2 without gallops or murmurs.  Abdomen: no obvious distention Msk:  Gait not assessed. Normal strength and tone for age. Extremities: No clubbing, cyanosis or edema.   Neuro: Alert and oriented X 3. Psych:  Good affect, responds appropriately   LABS: Basic Metabolic Panel: Recent Labs    05/10/19 0754 05/11/19 0533  NA 140 140  K 4.3 4.2  CL 106 106  CO2 25 27  GLUCOSE 155* 130*  BUN 62* 71*  CREATININE 1.65* 1.74*  CALCIUM 8.5* 8.6*  MG 2.8* 2.7*  PHOS 5.2* 4.2   Liver Function Tests: No results for input(s): AST, ALT, ALKPHOS, BILITOT, PROT, ALBUMIN in the last 72 hours. No results for input(s): LIPASE, AMYLASE in the last 72 hours. CBC: Recent Labs    05/09/19 0458  WBC 11.7*  NEUTROABS 10.2*  HGB 12.6*  HCT 39.5  MCV 88.4  PLT 195   Cardiac Enzymes: No results for input(s): CKTOTAL, CKMB, CKMBINDEX, TROPONINI in the  last 72 hours. BNP: Invalid input(s): POCBNP D-Dimer: No results for input(s): DDIMER in the last 72 hours. Hemoglobin A1C: No results for input(s): HGBA1C in the last 72 hours. Fasting Lipid Panel: No results for input(s): CHOL, HDL, LDLCALC, TRIG, CHOLHDL, LDLDIRECT in the last 72 hours. Thyroid Function Tests: No results for input(s): TSH, T4TOTAL, T3FREE, THYROIDAB in the last 72 hours.  Invalid input(s): FREET3 Anemia Panel: No results for input(s): VITAMINB12, FOLATE, FERRITIN, TIBC, IRON, RETICCTPCT in the last 72 hours.  No results found.   Echo pending  TELEMETRY: sinus rhythm  ASSESSMENT AND PLAN:  Active Problems:   Hypercholesterolemia without hypertriglyceridemia   Hemoptysis   HTN (hypertension)   Depression   1. Bradycardia, per pulse oximeter, not detected on rhythm strips, could have been secondary to PVCs, with normal functioning pacemaker.  2. Ventricular tachycardia; telemetry strips reviewed and reveal PVCs, occasionally in trigeminal pattern. Bedside ICD interrogation reveals 1 episode of NS-VT, which occurred in August 2020, with no recurrence, with no delivered shocks. 3. 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  4. Chronic systolic CHF, no sign of fluid overload, currently on Entresto and carvedilol. 5. Bronchitis and COPD exacerbation, on steroids and antibiotics 6. Dual chamber ICD at ERI, change-out needed  Recommendations: 1. Continue Entresto, carvedilol, amlodipine, pravastatin 3. Review  2D echocardiogram  4. Surgical ICD change-out scheduled for Wednesday, 11/18 about noon; may be discharged same-day if no perioperative complications. NPO after midnight. 5. Continue to monitor on telemetry   Clabe Seal, PA-C 05/11/2019 1:10 PM

## 2019-05-11 NOTE — Progress Notes (Signed)
Pulmonary Medicine          Date: 05/11/2019,   MRN# [id number] Phillip Allison. 06-16-1941     AdmissionWeight: 86.2 kg                 CurrentWeight: 86.2 kg      CHIEF COMPLAINT:   Nonmassive hemoptysis   SUBJECTIVE   Patient feels well, he is down to 1-2L/min O2 therapy.  He is no longer hemoptysizing.    He can be discharged home from pulmonary perscpective once other medical comorbidites are optimized.   Plan to finish levoquin and pred taper on outpatient basis, we will see patient within 7-14 days of d/c.   He   PAST MEDICAL HISTORY   Past Medical History:  Diagnosis Date   CHF (congestive heart failure) (HCC)    COPD (chronic obstructive pulmonary disease) (HCC)    Depression    Emphysema lung (HCC)    Hypercholesterolemia    Hypertension    Myocardial infarction ([place])    On home oxygen therapy    2 liters at night and as needed   Pneumonia    in the past   Presence of permanent cardiac pacemaker    Shortness of breath dyspnea    with exertion     SURGICAL HISTORY   Past Surgical History:  Procedure Laterality Date   ABDOMINAL AORTIC ANEURYSM REPAIR     ANGIOPLASTY     left lower extremity   CATARACT EXTRACTION W/PHACO Right 12/18/2015   Procedure: CATARACT EXTRACTION PHACO AND INTRAOCULAR LENS PLACEMENT ([place]);  Surgeon: Estill Cotta, MD;  Location: [facility] ORS;  Service: Ophthalmology;  Laterality: Right;  [place] 01:58 AP% 23.5 CDE 50.91 fluid pack lot # [id number]   INSERT / REPLACE / REMOVE PACEMAKER     PACEMAKER PLACEMENT     TONSILLECTOMY       FAMILY HISTORY   Family History  Problem Relation Age of Onset   Psoriasis Mother    Heart attack Father      SOCIAL HISTORY   Social History   Tobacco Use   Smoking status: Former Smoker    Packs/day: 0.75    Years: 54.00    Pack years: 40.50    Types: Cigarettes    Quit date: 2014    Years since quitting: 6.8   Smokeless tobacco:  Never Used   Tobacco comment: 2014?  Substance Use Topics   Alcohol use: No   Drug use: No     MEDICATIONS    Home Medication:    Current Medication:  Current Facility-Administered Medications:    acetaminophen (TYLENOL) tablet 650 mg, 650 mg, Oral, Q6H PRN **OR** acetaminophen (TYLENOL) suppository 650 mg, 650 mg, Rectal, Q6H PRN, Mansy, Jan A, MD   albuterol (PROVENTIL) (2.5 MG/3ML) 0.083% nebulizer solution 2.5 mg, 2.5 mg, Nebulization, Q6H PRN, Mansy, Jan A, MD   amLODipine (NORVASC) tablet 5 mg, 5 mg, Oral, Daily, Mansy, Jan A, MD, 5 mg at 05/11/19 1004   carvedilol (COREG) tablet 12.5 mg, 12.5 mg, Oral, BID WC, Mansy, Jan A, MD, 12.5 mg at 05/11/19 1003   chlorpheniramine-HYDROcodone (TUSSIONEX) 10-8 MG/5ML suspension 5 mL, 5 mL, Oral, Q12H PRN, Mansy, Jan A, MD   guaiFENesin (MUCINEX) 12 hr tablet 600 mg, 600 mg, Oral, BID, Mansy, Jan A, MD, 600 mg at 05/11/19 1004   influenza vaccine adjuvanted (FLUAD) injection 0.5 mL, 0.5 mL, Intramuscular, Tomorrow-1000, Spongberg, Audie Pinto, MD   Ipratropium-Albuterol (COMBIVENT) respimat 1 puff,  1 puff, Inhalation, QID, [name], RPH, 1 puff at 05/11/19 1006   [COMPLETED] levofloxacin (LEVAQUIN) tablet 500 mg, 500 mg, Oral, Once, 500 mg at 05/10/19 1216 **FOLLOWED BY** levofloxacin (LEVAQUIN) tablet 250 mg, 250 mg, Oral, Daily, Brantley Stage, Walid A, RPH, 250 mg at 05/11/19 1004   magnesium hydroxide (MILK OF MAGNESIA) suspension 30 mL, 30 mL, Oral, Daily PRN, Mansy, Jan A, MD   ondansetron (ZOFRAN) tablet 4 mg, 4 mg, Oral, Q6H PRN **OR** ondansetron (ZOFRAN) injection 4 mg, 4 mg, Intravenous, Q6H PRN, Mansy, Jan A, MD   pravastatin (PRAVACHOL) tablet 40 mg, 40 mg, Oral, QHS, Mansy, Jan A, MD, 40 mg at 05/10/19 2054   predniSONE (DELTASONE) tablet 50 mg, 50 mg, Oral, Q breakfast, Lanney Gins, Honestii Marton, MD, 50 mg at 05/11/19 1004   sacubitril-valsartan (ENTRESTO) 24-26 mg per tablet, 1 tablet, Oral, BID, Mansy, Jan A, MD,  1 tablet at 05/11/19 1004   sertraline (ZOLOFT) tablet 50 mg, 50 mg, Oral, Daily, Mansy, Jan A, MD, 50 mg at 05/11/19 1003   traZODone (DESYREL) tablet 25 mg, 25 mg, Oral, QHS PRN, Mansy, Arvella Merles, MD    ALLERGIES   Patient has no known allergies.     REVIEW OF SYSTEMS    Review of Systems:  Gen:  Denies  fever, sweats, chills weigh loss  HEENT: Denies blurred vision, double vision, ear pain, eye pain, hearing loss, nose bleeds, sore throat Cardiac:  No dizziness, chest pain or heaviness, chest tightness,edema Resp:   Admits to wheezing and hemoptysis with worsening dyspnea and hypoxemia Gi: Denies swallowing difficulty, stomach pain, nausea or vomiting, diarrhea, constipation, bowel incontinence Gu:  Denies bladder incontinence, burning urine Ext:   Denies Joint pain, stiffness or swelling Skin: Denies  skin rash, easy bruising or bleeding or hives Endoc:  Denies polyuria, polydipsia , polyphagia or weight change Psych:   Denies depression, insomnia or hallucinations   Other:  All other systems negative   VS: BP (!) 140/52 (BP Location: Left Arm)    Pulse 92    Temp 98.3 F (36.8 C) (Oral)    Resp 18    Ht 5\' 11"  (1.803 m)    Wt 86.2 kg    SpO2 92%    BMI 26.50 kg/m      PHYSICAL EXAM    GENERAL:NAD, no fevers, chills, no weakness no fatigue HEAD: Normocephalic, atraumatic.  EYES: Pupils equal, round, reactive to light. Extraocular muscles intact. No scleral icterus.  MOUTH: Moist mucosal membrane. Dentition intact. No abscess noted.  EAR, NOSE, THROAT: Clear without exudates. No external lesions.  NECK: Supple. No thyromegaly. No nodules. No JVD.  PULMONARY: Bilateral expiratory wheezing CARDIOVASCULAR: S1 and S2. Regular rate and rhythm. No murmurs, rubs, or gallops. No edema. Pedal pulses 2+ bilaterally.  GASTROINTESTINAL: Soft, nontender, nondistended. No masses. Positive bowel sounds. No hepatosplenomegaly.  MUSCULOSKELETAL: No swelling, clubbing, or edema.  Range of motion full in all extremities.  NEUROLOGIC: Cranial nerves II through XII are intact. No gross focal neurological deficits. Sensation intact. Reflexes intact.  SKIN: No ulceration, lesions, rashes, or cyanosis. Skin warm and dry. Turgor intact.  PSYCHIATRIC: Mood, affect within normal limits. The patient is awake, alert and oriented x 3. Insight, judgment intact.       IMAGING    Ct Angio Chest Pe W And/or Wo Contrast  Result Date: 05/08/2019 CLINICAL DATA:  Hemoptysis and chest pain EXAM: CT ANGIOGRAPHY CHEST WITH CONTRAST TECHNIQUE: Multidetector CT imaging of the chest was performed using the  standard protocol during bolus administration of intravenous contrast. Multiplanar CT image reconstructions and MIPs were obtained to evaluate the vascular anatomy. CONTRAST:  57mL OMNIPAQUE IOHEXOL 350 MG/ML SOLN COMPARISON:  Film from earlier in the same day, CT from 06/11/2018 FINDINGS: Cardiovascular: Thoracic aorta demonstrates atherosclerotic calcifications. No aneurysmal dilatation in the ascending aorta is seen although stable descending thoracic aortic dilatation is noted with irregular atherosclerotic plaque. Maximum dilatation of the descending thoracic aorta is stable at 4.5 cm similar to that seen on the prior exam. Pacing device is noted and stable. The pulmonary artery shows a normal branching pattern without intraluminal filling defect to suggest pulmonary embolism. Coronary calcifications are noted. Calcifications of the aortic valve are noted. Mediastinum/Nodes: Thoracic inlet is within normal limits. No hilar or mediastinal adenopathy is noted. The esophagus as visualized is within normal limits. Lungs/Pleura: The lungs are well aerated bilaterally. No focal confluent infiltrate is seen. Patchy scarring is noted in the right upper lobe improved from the prior exam. Mild emphysematous changes are noted. Patchy scarring is noted within the lungs bilaterally. No focal confluent  infiltrate is seen. Upper Abdomen: The right kidney is shrunken but stable from the prior exam. The left kidney is somewhat hypertrophied. The remainder of the upper abdomen is within normal limits. Musculoskeletal: Degenerative changes of the thoracic spine are noted. No acute rib abnormality is noted. No compression deformity is seen. Review of the MIP images confirms the above findings. IMPRESSION: No evidence of pulmonary emboli. 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 Slight improvement of the scarring in the right upper lobe when compared with the prior exam. Some patchy opacities are noted throughout both lungs stable from the previous study. Aortic Atherosclerosis (ICD10-I70.0) and Emphysema (ICD10-J43.9). Electronically Signed   By: Inez Catalina M.D.   On: 05/08/2019 02:39   Dg Chest Portable 1 View  Result Date: 05/07/2019 CLINICAL DATA:  Hemoptysis and shortness of breath EXAM: PORTABLE CHEST 1 VIEW COMPARISON:  09/10/2018 FINDINGS: Cardiac shadow is stable. Defibrillator is again noted and stable. Aortic calcifications are seen. No focal infiltrate or sizable effusion is seen. Mild chronic interstitial changes are again noted and stable. Degenerative changes of the thoracic spine are seen. IMPRESSION: Chronic changes without acute abnormality. Electronically Signed   By: Inez Catalina M.D.   On: 05/07/2019 23:59      ASSESSMENT/PLAN   Nonmassive hemoptysis CT chest without venous pulmonary thromboembolism -Abnormal area of the right lung at the apex with spiculated margins cannot rule out malignant focus-patient with high pretest probability for lung cancer -No clinical signs or radiographic evidence of pneumonia -Medical management with typical COPD care path and antibiotics for now -Additional discourse regarding goals of care  Centrilobular emphysema with COPD and chronic hypoxemia -agree with empiric antibiotic regimen - prednisone 50 with  taper down by 10mg  per day.  Continue levofloxacin 250 q daily for 7 days of total antibiotics - Combivent MDI q6hPRN -COPD care path -IS at bedside     Thank you Dr Wyonia Hough  for allowing me to participate in the care of this patient.   Patient/Family are satisfied with care plan and all questions have been answered.  This document was prepared using Dragon voice recognition software and may include unintentional dictation errors.     Ottie Glazier, M.D.  Division of [facility]

## 2019-05-11 NOTE — Consult Note (Signed)
PHARMACY CONSULT NOTE - FOLLOW UP  Pharmacy Consult for Electrolyte Monitoring and Replacement   Recent Labs: Potassium (mmol/L)  Date Value  05/11/2019 4.2  07/21/2012 4.4   Magnesium (mg/dL)  Date Value  05/11/2019 2.7 (H)  12/19/2011 1.9   Calcium (mg/dL)  Date Value  05/11/2019 8.6 (L)   Calcium, Total (mg/dL)  Date Value  07/21/2012 9.0   Albumin (g/dL)  Date Value  05/08/2019 3.6  09/10/2018 3.4 (L)  12/19/2011 2.9 (L)   Phosphorus (mg/dL)  Date Value  05/11/2019 4.2  12/19/2011 3.4   Sodium (mmol/L)  Date Value  05/11/2019 140  09/10/2018 141  07/21/2012 138     Assessment: Pharmacy consulted to monitor and replace electrolytes in 77yo patient.   Goal of Therapy:  Electrolytes WNL  Plan:  K 4.2, Mg 2.7, Phos 4.2 -Holding off on further replacement currently. -Will recheck levels with AM labs tomorrow morning.  Pearla Dubonnet ,PharmD Clinical Pharmacist 05/11/2019 7:19 AM

## 2019-05-11 NOTE — TOC Initial Note (Signed)
Transition of Care (TOC) - Initial/Assessment Note    Patient Details  Name: Phillip Allison. MRN: [id number] Date of Birth: 03/30/1941  Transition of Care [facility]) CM/SW Contact:    Su Hilt, RN Phone Number: 05/11/2019, 3:21 PM  Clinical Narrative:                 Met with the patient and discussed DC plan and needs He lives at home with his son He walks well independently and does not want any DME He has used HH in the past and does not want to use it again He has Home oxygen provided by [employer] He states that he has no needs   Expected Discharge Plan: Home/Self Care Barriers to Discharge: Continued Medical Work up   Patient Goals and CMS Choice Patient states their goals for this hospitalization and ongoing recovery are:: go home      Expected Discharge Plan and Services Expected Discharge Plan: Home/Self Care   Discharge Planning Services: CM Consult   Living arrangements for the past 2 months: Single Family Home                 DME Arranged: N/A         HH Arranged: Patient Refused HH          Prior Living Arrangements/Services Living arrangements for the past 2 months: Single Family Home Lives with:: Adult Children Patient language and need for interpreter reviewed:: Yes Do you feel safe going back to the place where you live?: Yes      Need for Family Participation in Patient Care: No (Comment) Care giver support system in place?: Yes (comment) Current home services: DME(Home Oxygen provided by [employer]) Criminal Activity/Legal Involvement Pertinent to Current Situation/Hospitalization: No - Comment as needed  Activities of Daily Living Home Assistive Devices/Equipment: Eyeglasses ADL Screening (condition at time of admission) Patient's cognitive ability adequate to safely complete daily activities?: Yes Is the patient deaf or have difficulty hearing?: No Does the patient have difficulty seeing, even when wearing glasses/contacts?:  No Does the patient have difficulty concentrating, remembering, or making decisions?: No Patient able to express need for assistance with ADLs?: Yes Does the patient have difficulty dressing or bathing?: No Independently performs ADLs?: Yes (appropriate for developmental age) Does the patient have difficulty walking or climbing stairs?: No Weakness of Legs: None Weakness of Arms/Hands: None  Permission Sought/Granted   Permission granted to share information with : Yes, Verbal Permission Granted              Emotional Assessment Appearance:: Appears stated age Attitude/Demeanor/Rapport: Engaged Affect (typically observed): Appropriate Orientation: : Oriented to Self, Oriented to Place, Oriented to  Time, Oriented to Situation Alcohol / Substance Use: Not Applicable Psych Involvement: No (comment)  Admission diagnosis:  Hemoptysis [R04.2] Patient Active Problem List   Diagnosis Date Noted  . Hemoptysis 05/08/2019  . HTN (hypertension) 05/08/2019  . Depression 05/08/2019  . Acute on chronic respiratory failure with hypoxia and hypercapnia ([place]) 10/08/2017  . COPD (chronic obstructive pulmonary disease) ([place]) 03/11/2016  . History of tobacco use 03/13/2015  . History of atrial fibrillation 03/13/2015  . Chronic systolic heart failure ([place]) 03/13/2015  . Intraventricular block 03/13/2015  . 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[place]) 03/10/2015  . Cardiovascular disease 03/10/2015  . Arteriosclerosis of coronary artery 03/10/2015  . CAFL (chronic airflow limitation) ([place]) 03/10/2015  . Chronic kidney disease (CKD), stage III (moderate) 03/10/2015  . Depression, major, single episode, mild ([place]) 03/10/2015  .  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 Acid reflux 03/10/2015  . Heart & renal disease, hypertensive, with heart failure ([place]) 03/10/2015  . Cardiomyopathy, ischemic 03/10/2015  . 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 Hypercholesterolemia without hypertriglyceridemia  03/10/2015  . Cardiac defibrillator in place 01/25/2015  . MI (mitral incompetence) 11/01/2014  . Benign essential HTN 10/17/2014   PCP:  Jerrol Banana., MD Pharmacy:   [place], [place] [address] Phone: [phone] Fax: Abanda Meagher, [place] [facility] [address] [facility] [place] [address] Phone: [phone] Fax: [phone]     Social Determinants of Health (SDOH) Interventions    Readmission Risk Interventions No flowsheet data found.

## 2019-05-12 ENCOUNTER — Encounter: Payer: Self-pay | Admitting: Anesthesiology

## 2019-05-12 ENCOUNTER — Inpatient Hospital Stay: Payer: Medicare Other | Admitting: Anesthesiology

## 2019-05-12 ENCOUNTER — Encounter
Admission: EM | Disposition: A | Discharge status: Home / Self Care | Payer: Self-pay | Source: Home / Self Care | Attending: Internal Medicine

## 2019-05-12 DIAGNOSIS — N1831 Chronic kidney disease, stage 3a: Secondary | ICD-10-CM

## 2019-05-12 DIAGNOSIS — I5022 Chronic systolic (congestive) heart failure: Secondary | ICD-10-CM

## 2019-05-12 DIAGNOSIS — I2510 Atherosclerotic heart disease of native coronary artery without angina pectoris: Secondary | ICD-10-CM

## 2019-05-12 DIAGNOSIS — I159 Secondary hypertension, unspecified: Secondary | ICD-10-CM

## 2019-05-12 DIAGNOSIS — J9621 Acute and chronic respiratory failure with hypoxia: Secondary | ICD-10-CM

## 2019-05-12 DIAGNOSIS — J9622 Acute and chronic respiratory failure with hypercapnia: Secondary | ICD-10-CM

## 2019-05-12 HISTORY — PX: IMPLANTABLE CARDIOVERTER DEFIBRILLATOR (ICD) GENERATOR CHANGE: SHX5469

## 2019-05-12 LAB — BASIC METABOLIC PANEL
Anion gap: 12 (ref 5–15)
BUN: 80 mg/dL — ABNORMAL HIGH (ref 8–23)
CO2: 25 mmol/L (ref 22–32)
Calcium: 8.2 mg/dL — ABNORMAL LOW (ref 8.9–10.3)
Chloride: 103 mmol/L (ref 98–111)
Creatinine, Ser: 1.71 mg/dL — ABNORMAL HIGH (ref 0.61–1.24)
GFR calc Af Amer: 44 mL/min — ABNORMAL LOW (ref >60)
GFR calc non Af Amer: 38 mL/min — ABNORMAL LOW (ref >60)
Glucose, Bld: 131 mg/dL — ABNORMAL HIGH (ref 70–99)
Potassium: 4.2 mmol/L (ref 3.5–5.1)
Sodium: 140 mmol/L (ref 135–145)

## 2019-05-12 LAB — PHOSPHORUS: Phosphorus: 4.8 mg/dL — ABNORMAL HIGH (ref 2.5–4.6)

## 2019-05-12 LAB — MAGNESIUM: Magnesium: 2.9 mg/dL — ABNORMAL HIGH (ref 1.7–2.4)

## 2019-05-12 SURGERY — ICD GENERATOR CHANGE
Anesthesia: General | Site: Shoulder

## 2019-05-12 MED ORDER — GLYCOPYRROLATE 0.2 MG/ML IJ SOLN
INTRAMUSCULAR | Status: DC | PRN
  Administered 2019-05-12: 0.2 mg via INTRAVENOUS

## 2019-05-12 MED ORDER — SODIUM CHLORIDE 0.9 % IV SOLN
80.0000 mg | INTRAVENOUS | Status: DC
  Filled 2019-05-12: qty 2

## 2019-05-12 MED ORDER — LACTATED RINGERS IV SOLN
INTRAVENOUS | Status: DC
  Administered 2019-05-12: 12:00:00 via INTRAVENOUS

## 2019-05-12 MED ORDER — PROPOFOL 500 MG/50ML IV EMUL
INTRAVENOUS | Status: DC | PRN
  Administered 2019-05-12: 75 ug/kg/min via INTRAVENOUS

## 2019-05-12 MED ORDER — MIDAZOLAM HCL 2 MG/2ML IJ SOLN
INTRAMUSCULAR | Status: AC
  Filled 2019-05-12: qty 2

## 2019-05-12 MED ORDER — SODIUM CHLORIDE 0.9 % IV SOLN
INTRAVENOUS | Status: DC | PRN
  Administered 2019-05-12: 30 ug/min via INTRAVENOUS

## 2019-05-12 MED ORDER — PHENYLEPHRINE HCL (PRESSORS) 10 MG/ML IV SOLN
INTRAVENOUS | Status: DC | PRN
  Administered 2019-05-12: 100 ug via INTRAVENOUS

## 2019-05-12 MED ORDER — DEXAMETHASONE SODIUM PHOSPHATE 10 MG/ML IJ SOLN
INTRAMUSCULAR | Status: DC | PRN
  Administered 2019-05-12: 10 mg via INTRAVENOUS

## 2019-05-12 MED ORDER — GENTAMICIN SULFATE 40 MG/ML IJ SOLN
INTRAMUSCULAR | Status: AC
  Filled 2019-05-12: qty 2

## 2019-05-12 MED ORDER — LEVOFLOXACIN 250 MG PO TABS: 250.0000 mg | ORAL_TABLET | Freq: Every day | ORAL | 0 refills | Status: DC

## 2019-05-12 MED ORDER — CEPHALEXIN 250 MG PO CAPS: 250.0000 mg | ORAL_CAPSULE | Freq: Four times a day (QID) | ORAL | 0 refills | Status: DC

## 2019-05-12 MED ORDER — MIDAZOLAM HCL 2 MG/2ML IJ SOLN
INTRAMUSCULAR | Status: DC | PRN
  Administered 2019-05-12 (×2): 1 mg via INTRAVENOUS

## 2019-05-12 MED ORDER — CARVEDILOL 6.25 MG PO TABS: 6.2500 mg | ORAL_TABLET | Freq: Two times a day (BID) | ORAL | 1 refills | Status: AC

## 2019-05-12 MED ORDER — LIDOCAINE 1 % OPTIME INJ - NO CHARGE
INTRAMUSCULAR | Status: DC | PRN
  Administered 2019-05-12: 25 mL

## 2019-05-12 MED ORDER — GUAIFENESIN ER 600 MG PO TB12: 600.0000 mg | ORAL_TABLET | Freq: Two times a day (BID) | ORAL | 0 refills | Status: AC

## 2019-05-12 MED ORDER — EPHEDRINE SULFATE 50 MG/ML IJ SOLN
INTRAMUSCULAR | Status: DC | PRN
  Administered 2019-05-12: 5 mg via INTRAVENOUS
  Administered 2019-05-12: 10 mg via INTRAVENOUS

## 2019-05-12 MED ORDER — CARVEDILOL 12.5 MG PO TABS
6.2500 mg | ORAL_TABLET | Freq: Two times a day (BID) | ORAL | Status: DC
  Administered 2019-05-12 – 2019-05-13 (×3): 6.25 mg via ORAL
  Filled 2019-05-12 (×2): qty 1

## 2019-05-12 MED ORDER — ONDANSETRON HCL 4 MG/2ML IJ SOLN
INTRAMUSCULAR | Status: DC | PRN
  Administered 2019-05-12: 4 mg via INTRAVENOUS

## 2019-05-12 MED ORDER — PROPOFOL 500 MG/50ML IV EMUL
INTRAVENOUS | Status: AC
  Filled 2019-05-12: qty 50

## 2019-05-12 MED ORDER — FENTANYL CITRATE (PF) 100 MCG/2ML IJ SOLN
INTRAMUSCULAR | Status: DC | PRN
  Administered 2019-05-12: 25 ug via INTRAVENOUS

## 2019-05-12 MED ORDER — FENTANYL CITRATE (PF) 100 MCG/2ML IJ SOLN: 25.0000 ug | INTRAMUSCULAR | Status: DC | PRN

## 2019-05-12 MED ORDER — SODIUM CHLORIDE 0.9 % IV SOLN
INTRAVENOUS | Status: DC
  Administered 2019-05-12 (×2): via INTRAVENOUS

## 2019-05-12 MED ORDER — OXYCODONE HCL 5 MG/5ML PO SOLN: 5.0000 mg | Freq: Once | ORAL | Status: DC | PRN

## 2019-05-12 MED ORDER — CALCIUM ACETATE (PHOS BINDER) 667 MG PO CAPS
667.0000 mg | ORAL_CAPSULE | Freq: Two times a day (BID) | ORAL | Status: AC
  Administered 2019-05-12 (×2): 667 mg via ORAL
  Filled 2019-05-12 (×2): qty 1

## 2019-05-12 MED ORDER — CARVEDILOL 6.25 MG PO TABS
6.2500 mg | ORAL_TABLET | Freq: Two times a day (BID) | ORAL | Status: DC
  Filled 2019-05-12: qty 1

## 2019-05-12 MED ORDER — OXYCODONE HCL 5 MG PO TABS: 5.0000 mg | ORAL_TABLET | Freq: Once | ORAL | Status: DC | PRN

## 2019-05-12 MED ORDER — CEFAZOLIN SODIUM-DEXTROSE 2-4 GM/100ML-% IV SOLN
INTRAVENOUS | Status: AC
  Filled 2019-05-12: qty 100

## 2019-05-12 MED ORDER — PREDNISONE 10 MG PO TABS: 10.0000 mg | ORAL_TABLET | Freq: Every day | ORAL | 0 refills | Status: DC

## 2019-05-12 MED ORDER — FENTANYL CITRATE (PF) 100 MCG/2ML IJ SOLN
INTRAMUSCULAR | Status: AC
  Filled 2019-05-12: qty 2

## 2019-05-12 SURGICAL SUPPLY — 26 items
BAG DECANTER FOR FLEXI CONT (MISCELLANEOUS) ×3 IMPLANT
BENZOIN TINCTURE PRP APPL 2/3 (GAUZE/BANDAGES/DRESSINGS) ×3 IMPLANT
BLADE PHOTON ILLUMINATED (MISCELLANEOUS) ×3 IMPLANT
CANISTER SUCT 1200ML W/VALVE (MISCELLANEOUS) ×3 IMPLANT
CHLORAPREP W/TINT 26 (MISCELLANEOUS) ×3 IMPLANT
COVER LIGHT HANDLE STERIS (MISCELLANEOUS) ×6 IMPLANT
COVER MAYO STAND REUSABLE (DRAPES) ×3 IMPLANT
COVER WAND RF STERILE (DRAPES) ×3 IMPLANT
DRSG TEGADERM 4X4.75 (GAUZE/BANDAGES/DRESSINGS) ×3 IMPLANT
DRSG TELFA 3X8 NADH (GAUZE/BANDAGES/DRESSINGS) ×3 IMPLANT
DRSG TELFA 4X3 1S NADH ST (GAUZE/BANDAGES/DRESSINGS) ×3 IMPLANT
ELECT REM PT RETURN 9FT ADLT (ELECTROSURGICAL) ×3
ELECTRODE REM PT RTRN 9FT ADLT (ELECTROSURGICAL) ×1 IMPLANT
GLOVE BIO SURGEON STRL SZ7.5 (GLOVE) ×3 IMPLANT
GLOVE BIO SURGEON STRL SZ8 (GLOVE) ×3 IMPLANT
GOWN STRL REUS W/ TWL LRG LVL3 (GOWN DISPOSABLE) ×1 IMPLANT
GOWN STRL REUS W/ TWL XL LVL3 (GOWN DISPOSABLE) ×1 IMPLANT
GOWN STRL REUS W/TWL LRG LVL3 (GOWN DISPOSABLE) ×2
GOWN STRL REUS W/TWL XL LVL3 (GOWN DISPOSABLE) ×2
ICD EVERA DR XT MRI DDMB1D4 (ICD Generator) IMPLANT
KIT TURNOVER KIT A (KITS) ×3 IMPLANT
MARKER SKIN DUAL TIP RULER LAB (MISCELLANEOUS) ×3 IMPLANT
PACK PACE INSERTION (MISCELLANEOUS) ×3 IMPLANT
PAD ONESTEP ZOLL R SERIES ADT (MISCELLANEOUS) ×3 IMPLANT
STRAP SAFETY 5IN WIDE (MISCELLANEOUS) ×3 IMPLANT
TAPE STRIPS DRAPE STRL (GAUZE/BANDAGES/DRESSINGS) ×3 IMPLANT

## 2019-05-12 NOTE — Discharge Summary (Addendum)
Physician Discharge Summary  Phillip Allison. VFI:[id number] DOB: 03-Feb-1941 DOA: 05/07/2019  PCP: Jerrol Banana., MD  Admit date: 05/07/2019 Discharge date: 05/13/2019  Recommendations for Outpatient Follow-up:  1. Follow up with PCP in 1 weeks and cardiologist on 05/24/19, and nephrologist in 1 week 2. Please obtain BMP/CBC in one week 3. Please check his phosphorus level and magnesium level  Home Health: PT recommended home health PT, but patient refused it.  Equipment/Devices: none   Discharge Condition: stable CODE STATUS:  DNR Diet recommendation:  Low salt  Brief/Interim Summary (HPI) Phillip Allison  is a 78 y.o. Caucasian male with a known history of CHF, COPD, depression, hypertension, coronary artery disease and dyslipidemia, who presented to the emergency room with acute onset of excessive cough with subsequent hemoptysis with pure blood that amounted to about 5 to 10 mL, with associated dyspnea and wheezing without fever or chills.  He denied any nausea or vomiting or hematemesis.  No other bleeding diathesis.  He was markedly pale per his son after that.  When his son applied pulse oximetry his O2 level was 83%.  The patient denies any chest pain or palpitations.  No dysuria, oliguria or hematuria or flank pain.  He denies any rhinorrhea or nasal congestion or sore throat or earache.  Upon presentation to the emergency room, blood pressure was 158/90 with pulse symmetry of 83% on room air with otherwise normal vital signs.  Labs revealed a BUN of 33 and creatinine 1.54 better than previous levels in March and CBC with hemoglobin of 13.8 hematocrit 43.6.  The patient was typed and crossmatched.  He had a portable chest x-ray that revealed chronic changes with no acute abnormalities.  CT angiography revealed no evidence for PE but showed aneurysmal dilation of the ascending aorta similar to that seen on prior exam with slight improvement of the scarring in the  right upper lobe when compared to prior exam and some patchy opacities noted throughout both lungs stable from previous study.  It showed aortic atherosclerosis and emphysema.  The patient was given IV Rocephin and Zithromax was duo nebs and IV Solu-Medrol.  He will be admitted to an observation medical monitored bed for further evaluation and management.  Subjective   patient has mild SOB and mild dry cough, no chest pain. No fever or chills. No nausea, vomiting, diarrhea or abdominal pain.    Discharge Diagnoses and Hospital Course:   Principal Problem:   Acute on chronic respiratory failure with hypoxia and hypercapnia (HCC) Active Problems:   Arteriosclerosis of coronary artery   Chronic kidney disease (CKD), stage III (moderate)   Hypercholesterolemia without hypertriglyceridemia   Benign essential HTN   Chronic systolic heart failure (HCC)   Hemoptysis   HTN (hypertension)   Depression   Hyperphosphatemia   Hypermagnesemia   Hyperkalemia   Acute on chronic respiratory failure with hypoxia and hypercapnia ([place]): pt had non-massiveHemoptysis likely with acute bronchitis and subsequent mild COPD exacerbation. Pulmonology, Dr. Dustin Flock was consulted. He recommended recommended to continue levofloxacin 250 q daily for 7 days of total antibiotics and prednisone 50 with taper down by 10mg  per day.  -will continue bronchodilators -Prednisone tapering -Levaquin for total for 7 days of antibiotics  Anti-infectives (From admission, onward)   Start     Dose/Rate Route Frequency Ordered Stop   05/13/19 0000  levofloxacin (LEVAQUIN) 250 MG tablet  Status:  Discontinued     250 mg Oral Daily 05/12/19 1640 05/12/19  05/13/19 0000  levofloxacin (LEVAQUIN) 250 MG tablet     250 mg Oral Daily 05/12/19 1646     05/12/19 1145  gentamicin (GARAMYCIN) 80 mg in sodium chloride 0.9 % 500 mL irrigation  Status:  Discontinued     80 mg Irrigation On call 05/12/19 1131 05/12/19 1352   05/12/19  1145  ceFAZolin (ANCEF) 2-4 GM/100ML-% IVPB    Note to Pharmacy: Register, Karen   : cabinet override      05/12/19 1145 05/12/19 2359   05/12/19 0000  cephALEXin (KEFLEX) 250 MG capsule  Status:  Discontinued     250 mg Oral 4 times daily 05/12/19 1304 05/12/19    05/11/19 2230  ceFAZolin (ANCEF) IVPB 2g/100 mL premix  Status:  Discontinued     2 g 200 mL/hr over 30 Minutes Intravenous On call 05/11/19 2224 05/12/19 1352   05/11/19 1000  levofloxacin (LEVAQUIN) tablet 250 mg     250 mg Oral Daily 05/10/19 1042 05/15/19 0959   05/10/19 1100  levofloxacin (LEVAQUIN) tablet 500 mg     500 mg Oral  Once 05/10/19 1042 05/10/19 1216   05/10/19 1030  levofloxacin (LEVAQUIN) tablet 500 mg  Status:  Discontinued     500 mg Oral Daily 05/10/19 1026 05/10/19 1042   05/09/19 0400  cefTRIAXone (ROCEPHIN) 1 g in sodium chloride 0.9 % 100 mL IVPB  Status:  Discontinued     1 g 200 mL/hr over 30 Minutes Intravenous Every 24 hours 05/08/19 0413 05/10/19 1026   05/09/19 0400  azithromycin (ZITHROMAX) 500 mg in sodium chloride 0.9 % 250 mL IVPB  Status:  Discontinued     500 mg 250 mL/hr over 60 Minutes Intravenous Every 24 hours 05/08/19 0413 05/10/19 1026   05/08/19 0315  cefTRIAXone (ROCEPHIN) 1 g in sodium chloride 0.9 % 100 mL IVPB     1 g 200 mL/hr over 30 Minutes Intravenous  Once 05/08/19 0310 05/08/19 1059   05/08/19 0315  azithromycin (ZITHROMAX) 500 mg in sodium chloride 0.9 % 250 mL IVPB     500 mg 250 mL/hr over 60 Minutes Intravenous  Once 05/08/19 0310 05/08/19 1059      Hypertension. -continue Entresto, Coreg and amlodipine. -per Dr. Candiss Norse of nephrology, pt need to hold his Entresto for 1 week. I give pt instruction about this ("Please do not take your Entresto for 7 days, you need to follow up with Dr. Candiss Norse of nephrology in 1 week. He will tell you if you should restart taking Entresto").  Dyslipidemia.  -Continue statin therapy.  Depression. We will continue  Zoloft.  Wide-complex tachycardia and Bradycardia:Consulted [place] cards. ICD GENERATOR CHANGE was done 05/12/19.  Arteriosclerosis of coronary artery: no CP. -on statin and ASA  Chronic kidney disease (CKD), stage III (moderate): . -f/u by BMP  Chronic systolic heart failure ([place]): no sign of fluid overload,  -continue Entresto and carvedilol.  Hyperphosphatemia, hypermagnesemia, hyperkalemia: K 5.2, Mg 3.3 and Phosphorus 5.8. pt was treated with Lokelma 10 mg for hyperkalemia. He was treated with calcium acetate (PHOSLO) capsule 667 mg for Hyperphosphatemia. Renal, Dr. Candiss Norse was consulted. He recommended to recheck stat BMP and CK level. If no worsening, pt can be d/c'ed and follow up with Dr. Candiss Norse in 1 week. He also recommended to hold Entresto for 1 week. The repeated BMP showed K 4.7, Mg 3.2 and CK 4.7. pt will be discharged today.    Discharge Instructions   Allergies as of 05/13/2019   No Known Allergies  Medication List    TAKE these medications   albuterol (2.5 MG/3ML) 0.083% nebulizer solution Commonly known as: PROVENTIL USE 1 VIAL VIA NEBULIZER EVERY 6 HOURS AS NEEDED FOR WHEEZING OR SHORTNESS OF BREATH What changed:   how much to take  how to take this  when to take this  reasons to take this  additional instructions   amLODipine 5 MG tablet Commonly known as: NORVASC Take 1 tablet (5 mg total) by mouth daily.   aspirin 81 MG tablet Take 81 mg by mouth at bedtime.   carvedilol 6.25 MG tablet Commonly known as: COREG Take 1 tablet (6.25 mg total) by mouth 2 (two) times daily with a meal. What changed:   medication strength  how much to take   Entresto 24-26 MG Generic drug: sacubitril-valsartan Take 1 tablet by mouth 2 (two) times daily.   guaiFENesin 600 MG 12 hr tablet Commonly known as: MUCINEX Take 1 tablet (600 mg total) by mouth 2 (two) times daily.   levofloxacin 250 MG tablet Commonly known as: LEVAQUIN Take 1 tablet (250 mg  total) by mouth daily.   pravastatin 40 MG tablet Commonly known as: PRAVACHOL Take 1 tablet (40 mg total) by mouth at bedtime.   predniSONE 10 MG tablet Commonly known as: DELTASONE Take 1 tablet (10 mg total) by mouth daily.   sertraline 50 MG tablet Commonly known as: ZOLOFT Take 1 tablet (50 mg total) by mouth daily.      Follow-up Information    Corey Skains, MD. Go on 05/24/2019.   Specialty: Cardiology Contact information: [facility] [place] [address] [phone]        Jerrol Banana., MD Follow up in 1 week(s).   Specialty: Family Medicine Contact information: [address] [place] [address] [phone]        Murlean Iba, MD Follow up in 1 week(s).   Specialty: Nephrology Contact information: [place] [address] [phone]          No Known Allergies  Consultations:  Cardiology  pulmonology   Procedures/Studies: Ct Angio Chest Pe W And/or Wo Contrast  Result Date: 05/08/2019 CLINICAL DATA:  Hemoptysis and chest pain EXAM: CT ANGIOGRAPHY CHEST WITH CONTRAST TECHNIQUE: Multidetector CT imaging of the chest was performed using the standard protocol during bolus administration of intravenous contrast. Multiplanar CT image reconstructions and MIPs were obtained to evaluate the vascular anatomy. CONTRAST:  80mL OMNIPAQUE IOHEXOL 350 MG/ML SOLN COMPARISON:  Film from earlier in the same day, CT from 06/11/2018 FINDINGS: Cardiovascular: Thoracic aorta demonstrates atherosclerotic calcifications. No aneurysmal dilatation in the ascending aorta is seen although stable descending thoracic aortic dilatation is noted with irregular atherosclerotic plaque. Maximum dilatation of the descending thoracic aorta is stable at 4.5 cm similar to that seen on the prior exam. Pacing device is noted and stable. The pulmonary artery shows a normal branching  pattern without intraluminal filling defect to suggest pulmonary embolism. Coronary calcifications are noted. Calcifications of the aortic valve are noted. Mediastinum/Nodes: Thoracic inlet is within normal limits. No hilar or mediastinal adenopathy is noted. The esophagus as visualized is within normal limits. Lungs/Pleura: The lungs are well aerated bilaterally. No focal confluent infiltrate is seen. Patchy scarring is noted in the right upper lobe improved from the prior exam. Mild emphysematous changes are noted. Patchy scarring is noted within the lungs bilaterally. No focal confluent infiltrate is seen. Upper Abdomen: The right kidney is  shrunken but stable from the prior exam. The left kidney is somewhat hypertrophied. The remainder of the upper abdomen is within normal limits. Musculoskeletal: Degenerative changes of the thoracic spine are noted. No acute rib abnormality is noted. No compression deformity is seen. Review of the MIP images confirms the above findings. IMPRESSION: No evidence of pulmonary emboli. 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 Slight improvement of the scarring in the right upper lobe when compared with the prior exam. Some patchy opacities are noted throughout both lungs stable from the previous study. Aortic Atherosclerosis (ICD10-I70.0) and Emphysema (ICD10-J43.9). Electronically Signed   By: Inez Catalina M.D.   On: 05/08/2019 02:39   Dg Chest Portable 1 View  Result Date: 05/07/2019 CLINICAL DATA:  Hemoptysis and shortness of breath EXAM: PORTABLE CHEST 1 VIEW COMPARISON:  09/10/2018 FINDINGS: Cardiac shadow is stable. Defibrillator is again noted and stable. Aortic calcifications are seen. No focal infiltrate or sizable effusion is seen. Mild chronic interstitial changes are again noted and stable. Degenerative changes of the thoracic spine are seen. IMPRESSION: Chronic changes without acute abnormality. Electronically Signed   By:  Inez Catalina M.D.   On: 05/07/2019 23:59      Discharge Exam: Vitals:   05/13/19 0407 05/13/19 1029  BP: (!) 113/58 (!) 141/75  Pulse: 74 69  Resp: 17 17  Temp: 98.5 F (36.9 C)   SpO2: 100% 100%   Vitals:   05/12/19 2010 05/13/19 0022 05/13/19 0407 05/13/19 1029  BP: 134/64 (!) 134/52 (!) 113/58 (!) 141/75  Pulse: 67 64 74 69  Resp: 17 17 17 17   Temp: 98.2 F (36.8 C) 98.3 F (36.8 C) 98.5 F (36.9 C)   TempSrc:      SpO2: 99% 100% 100% 100%  Weight:      Height:        General: Pt is alert, awake, not in acute distress Cardiovascular: RRR, S1/S2 +, no rubs, no gallops Respiratory: CTA bilaterally, no wheezing, no rhonchi Abdominal: Soft, NT, ND, bowel sounds + Extremities: no edema, no cyanosis    The results of significant diagnostics from this hospitalization (including imaging, microbiology, ancillary and laboratory) are listed below for reference.     Microbiology: Recent Results (from the past 240 hour(s))  Expectorated sputum assessment w rflx to resp cult     Status: None   Collection Time: 05/08/19  6:53 AM   Specimen: Sputum  Result Value Ref Range Status   Specimen Description SPUTUM  Final   Special Requests NONE  Final   Sputum evaluation   Final    THIS SPECIMEN IS ACCEPTABLE FOR SPUTUM CULTURE Performed at [facility], [address], [place], [place] [address]    Report Status 05/08/2019 FINAL  Final  Culture, respiratory     Status: None   Collection Time: 05/08/19  6:53 AM   Specimen: SPU  Result Value Ref Range Status   Specimen Description   Final    SPUTUM Performed at [facility], [address], [place], [place] [address]    Special Requests   Final    NONE Reflexed from [id number] Performed at [facility], [place] [address] [place],  [address]    Gram Stain   Final    RARE WBC PRESENT, PREDOMINANTLY PMN RARE GRAM POSITIVE COCCI RARE GRAM NEGATIVE RODS    Culture   Final    FEW Consistent  with normal respiratory flora. Performed at [facility], [place] [address], [place], [place] [address]    Report Status 05/10/2019 FINAL  Final  SARS CORONAVIRUS 2 (TAT 6-24 HRS) Nasopharyngeal Nasopharyngeal Swab     Status: None   Collection Time: 05/08/19  6:54 AM   Specimen: Nasopharyngeal Swab  Result Value Ref Range Status   SARS Coronavirus 2 NEGATIVE NEGATIVE Final    Comment: (NOTE) SARS-CoV-2 target nucleic acids are NOT DETECTED. The SARS-CoV-2 RNA is generally detectable in upper and lower respiratory specimens during the acute phase of infection. Negative results do not preclude SARS-CoV-2 infection, do not rule out co-infections with other pathogens, and should not be used as the sole basis for treatment or other patient management decisions. Negative results must be combined with clinical observations, patient history, and epidemiological information. The expected result is Negative. Fact Sheet for Patients: SugarRoll.be Fact Sheet for Healthcare Providers: https://www.woods-mathews.com/ This test is not yet approved or cleared by the [place] FDA and  has been authorized for detection and/or diagnosis of SARS-CoV-2 by FDA under an Emergency Use Authorization (EUA). This EUA will remain  in effect (meaning this test can be used) for the duration of the COVID-19 declaration under Section 56 4(b)(1) of the Act, 21 U.S.C. section 360bbb-3(b)(1), unless the authorization is terminated or revoked sooner. Performed at [facility], [place] [address] [place], [place] [address]   Surgical PCR screen     Status: None   Collection Time: 05/11/19  1:17 PM   Specimen: Nasal Mucosa; Nasal Swab  Result Value Ref Range Status   MRSA, PCR NEGATIVE NEGATIVE Final   Staphylococcus aureus NEGATIVE NEGATIVE Final    Comment: (NOTE) The Xpert SA Assay (FDA approved for NASAL specimens in patients 55 years of age and older), is  one component of a comprehensive surveillance program. It is not intended to diagnose infection nor to guide or monitor treatment. Performed at [facility], [address] [place],  [address]      Labs: BNP (last 3 results) No results for input(s): BNP in the last 8760 hours. Basic Metabolic Panel: Recent Labs  Lab 05/10/19 0754 05/11/19 0533 05/12/19 0524 05/13/19 0423 05/13/19 1446  NA 140 140 140 139 137  K 4.3 4.2 4.2 5.2* 4.7  CL 106 106 103 102 103  CO2 25 27 25 28 26   GLUCOSE 155* 130* 131* 155* 183*  BUN 62* 71* 80* 78* 76*  CREATININE 1.65* 1.74* 1.71* 1.90* 1.74*  CALCIUM 8.5* 8.6* 8.2* 8.6* 8.2*  MG 2.8* 2.7* 2.9* 3.3* 3.2*  PHOS 5.2* 4.2 4.8* 5.8*  --    Liver Function Tests: Recent Labs  Lab 05/08/19 0030  AST 15  ALT 13  ALKPHOS 76  BILITOT 0.5  PROT 6.9  ALBUMIN 3.6   No results for input(s): LIPASE, AMYLASE in the last 168 hours. No results for input(s): AMMONIA in the last 168 hours. CBC: Recent Labs  Lab 05/07/19 2331 05/08/19 0653 05/09/19 0458  WBC 11.1* 11.3* 11.7*  NEUTROABS  --   --  10.2*  HGB 13.8 13.2 12.6*  HCT 43.6 40.4 39.5  MCV 89.5 86.5 88.4  PLT 147* 192 195   Cardiac Enzymes: Recent Labs  Lab 05/13/19 1446  CKTOTAL 30*   BNP: Invalid input(s): POCBNP CBG: No results for input(s): GLUCAP in the last 168 hours. D-Dimer No results for input(s): DDIMER in the last 72 hours. Hgb A1c No results for input(s): HGBA1C in the last 72 hours. Lipid Profile No results  for input(s): CHOL, HDL, LDLCALC, TRIG, CHOLHDL, LDLDIRECT in the last 72 hours. Thyroid function studies No results for input(s): TSH, T4TOTAL, T3FREE, THYROIDAB in the last 72 hours.  Invalid input(s): FREET3 Anemia work up No results for input(s): VITAMINB12, FOLATE, FERRITIN, TIBC, IRON, RETICCTPCT in the last 72 hours. Urinalysis    Component Value Date/Time   COLORURINE YELLOW (A) 10/08/2017 1339   APPEARANCEUR CLEAR (A)  10/08/2017 1339   APPEARANCEUR Clear 07/21/2012 1126   LABSPEC 1.015 10/08/2017 1339   LABSPEC 1.006 07/21/2012 1126   PHURINE 5.0 10/08/2017 1339   GLUCOSEU NEGATIVE 10/08/2017 1339   GLUCOSEU Negative 07/21/2012 1126   HGBUR NEGATIVE 10/08/2017 1339   BILIRUBINUR NEGATIVE 10/08/2017 1339   BILIRUBINUR Negative 07/21/2012 [place] 10/08/2017 1339   PROTEINUR 30 (A) 10/08/2017 1339   NITRITE NEGATIVE 10/08/2017 1339   LEUKOCYTESUR NEGATIVE 10/08/2017 1339   LEUKOCYTESUR Negative 07/21/2012 1126   Sepsis Labs Invalid input(s): PROCALCITONIN,  WBC,  LACTICIDVEN Microbiology Recent Results (from the past 240 hour(s))  Expectorated sputum assessment w rflx to resp cult     Status: None   Collection Time: 05/08/19  6:53 AM   Specimen: Sputum  Result Value Ref Range Status   Specimen Description SPUTUM  Final   Special Requests NONE  Final   Sputum evaluation   Final    THIS SPECIMEN IS ACCEPTABLE FOR SPUTUM CULTURE Performed at [facility], [address], [place], [place] [address]    Report Status 05/08/2019 FINAL  Final  Culture, respiratory     Status: None   Collection Time: 05/08/19  6:53 AM   Specimen: SPU  Result Value Ref Range Status   Specimen Description   Final    SPUTUM Performed at [facility], [address] [place], [place] [address]    Special Requests   Final    NONE Reflexed from [id number] Performed at [facility], [address] [place], [place] [address]    Gram Stain   Final    RARE WBC PRESENT, PREDOMINANTLY PMN RARE GRAM POSITIVE COCCI RARE GRAM NEGATIVE RODS    Culture   Final    FEW Consistent with normal respiratory flora. Performed at [facility], [place] [address] [place], [place] [address]    Report Status 05/10/2019 FINAL  Final  SARS CORONAVIRUS 2 (TAT 6-24 HRS) Nasopharyngeal Nasopharyngeal Swab     Status: None   Collection Time: 05/08/19  6:54 AM   Specimen: Nasopharyngeal Swab   Result Value Ref Range Status   SARS Coronavirus 2 NEGATIVE NEGATIVE Final    Comment: (NOTE) SARS-CoV-2 target nucleic acids are NOT DETECTED. The SARS-CoV-2 RNA is generally detectable in upper and lower respiratory specimens during the acute phase of infection. Negative results do not preclude SARS-CoV-2 infection, do not rule out co-infections with other pathogens, and should not be used as the sole basis for treatment or other patient management decisions. Negative results must be combined with clinical observations, patient history, and epidemiological information. The expected result is Negative. Fact Sheet for Patients: SugarRoll.be Fact Sheet for Healthcare Providers: https://www.woods-mathews.com/ This test is not yet approved or cleared by the [place] FDA and  has been authorized for detection and/or diagnosis of SARS-CoV-2 by FDA under an Emergency Use Authorization (EUA). This EUA will remain  in effect (meaning this test can be used) for the duration of the COVID-19 declaration under Section 56 4(b)(1) of the Act, 21 U.S.C. section 360bbb-3(b)(1), unless the  authorization is terminated or revoked sooner. Performed at [facility], [place] [address], [place], [place] [address]   Surgical PCR screen     Status: None   Collection Time: 05/11/19  1:17 PM   Specimen: Nasal Mucosa; Nasal Swab  Result Value Ref Range Status   MRSA, PCR NEGATIVE NEGATIVE Final   Staphylococcus aureus NEGATIVE NEGATIVE Final    Comment: (NOTE) The Xpert SA Assay (FDA approved for NASAL specimens in patients 58 years of age and older), is one component of a comprehensive surveillance program. It is not intended to diagnose infection nor to guide or monitor treatment. Performed at [facility], [address], [place], [place] [address]     Time coordinating discharge:  35 minutes.   SIGNED:  Ivor Costa, DO [facility] 05/13/2019, 3:19 PM Pager is on [place]  If 7PM-7AM, please contact night-coverage [email or web address] Password [email or web address]

## 2019-05-12 NOTE — Consult Note (Signed)
PHARMACY CONSULT NOTE - FOLLOW UP  Pharmacy Consult for Electrolyte Monitoring and Replacement   Recent Labs: Potassium (mmol/L)  Date Value  05/12/2019 4.2  07/21/2012 4.4   Magnesium (mg/dL)  Date Value  05/12/2019 2.9 (H)  12/19/2011 1.9   Calcium (mg/dL)  Date Value  05/12/2019 8.2 (L)   Calcium, Total (mg/dL)  Date Value  07/21/2012 9.0   Albumin (g/dL)  Date Value  05/08/2019 3.6  09/10/2018 3.4 (L)  12/19/2011 2.9 (L)   Phosphorus (mg/dL)  Date Value  05/12/2019 4.8 (H)  12/19/2011 3.4   Sodium (mmol/L)  Date Value  05/12/2019 140  09/10/2018 141  07/21/2012 138     Assessment: Pharmacy consulted to monitor and replace electrolytes in 78yo patient.   Goal of Therapy:  Electrolytes WNL  Plan:  K 4.2, Mg 2.9, Phos 4.8 -Spoke with hospitalist, will order Calcium Acetate 667mg  PO x 2 doses to help lower phosphorous level. -Will recheck levels with AM labs tomorrow morning.  Pearla Dubonnet ,PharmD Clinical Pharmacist 05/12/2019 9:12 AM

## 2019-05-12 NOTE — Anesthesia Postprocedure Evaluation (Signed)
Anesthesia Post Note  Patient: Phillip Allison.  Procedure(s) Performed: ICD GENERATOR CHANGE (N/A Shoulder)  Patient location during evaluation: PACU Anesthesia Type: General Level of consciousness: awake and alert Pain management: pain level controlled Vital Signs Assessment: post-procedure vital signs reviewed and stable Respiratory status: spontaneous breathing, nonlabored ventilation, respiratory function stable and patient connected to nasal cannula oxygen Cardiovascular status: blood pressure returned to baseline and stable Postop Assessment: no apparent nausea or vomiting Anesthetic complications: no     Last Vitals:  Vitals:   05/12/19 1333 05/12/19 1413  BP: 103/79 110/62  Pulse: (!) 44 72  Resp: 12 17  Temp: (!) 36.3 C 36.4 C  SpO2: 99% 99%    Last Pain:  Vitals:   05/12/19 1413  TempSrc: Oral  PainSc: 0-No pain                 Precious Haws Piscitello

## 2019-05-12 NOTE — Progress Notes (Addendum)
PROGRESS NOTE    Phillip Allison.  VOH:[id number] DOB: Sep 13, 1940 DOA: 05/07/2019 PCP: Jerrol Banana., MD    Brief Narrative:   Brief/Interim Summary (HPI) [name] 78 y.o.Caucasian malewith a known history of CHF, COPD, depression, hypertension, coronary artery disease and dyslipidemia, who presented to the emergency room with acute onset of excessive cough with subsequent hemoptysis with pure blood that amounted to about 5 to 10 mL, with associated dyspnea and wheezing without fever or chills. He denied any nausea or vomiting or hematemesis. No other bleeding diathesis. He was markedly pale per his son after that. When his son applied pulse oximetry his O2 level was 83%. The patient denies any chest pain or palpitations. No dysuria, oliguria or hematuria or flank pain. He denies any rhinorrhea or nasal congestion or sore throat or earache.  Upon presentation to the emergency room, blood pressure was 158/90 with pulse symmetry of 83% on room air with otherwise normal vital signs. Labs revealed a BUN of 33 and creatinine 1.54 better than previous levels in March and CBC with hemoglobin of 13.8 hematocrit 43.6. The patient was typed and crossmatched. He had a portable chest x-ray that revealed chronic changes with no acute abnormalities. CT angiography revealed no evidence for PE but showed aneurysmal dilation of the ascending aorta similar to that seen on prior exam with slight improvement of the scarring in the right upper lobe when compared to prior exam and some patchy opacities noted throughout both lungs stable from previous study. It showed aortic atherosclerosis and emphysema.  The patient was given IV Rocephin and Zithromax was duo nebs and IV Solu-Medrol. He will be admitted to an observation medical monitored bed for further evaluation and management.   Interim History: -11/18  ICD GENERATOR CHANGE was done   Subjective: patient  has mild SOB and mild dry cough, no chest pain. No fever or chills. No nausea, vomiting, diarrhea or abdominal pain.   Assessment & Plan:   Principal Problem:   Acute on chronic respiratory failure with hypoxia and hypercapnia (HCC) Active Problems:   Arteriosclerosis of coronary artery   Chronic kidney disease (CKD), stage III (moderate)   Hypercholesterolemia without hypertriglyceridemia   Benign essential HTN   Chronic systolic heart failure (HCC)   Hemoptysis   HTN (hypertension)   Depression   Hyperphosphatemia   Hypermagnesemia   Acute on chronic respiratory failure with hypoxia and hypercapnia, ruled in [facility]): pt had non-massiveHemoptysis likely with acute bronchitis and subsequent mild COPD exacerbation. Pulmonology, Dr. Dustin Flock was consulted. He recommended recommended to continue levofloxacin 250 q daily for 7 days of total antibiotics and prednisone 50with taper down by 10mg  per day.   Symptoms improved significantly.  Oxygen saturation 92% on home 2 L oxygen.  -will continue bronchodilators -Prednisone tapering -Levaquin for total for 7 days of antibiotics             Anti-infectives (From admission, onward)     Start     Dose/Rate Route Frequency Ordered Stop   05/13/19 0000  levofloxacin (LEVAQUIN) 250 MG tablet  Status:  Discontinued     250 mg Oral Daily 05/12/19 1640 05/12/19    05/13/19 0000  levofloxacin (LEVAQUIN) 250 MG tablet     250 mg Oral Daily 05/12/19 1646     05/12/19 1145  gentamicin (GARAMYCIN) 80 mg in sodium chloride 0.9 % 500 mL irrigation  Status:  Discontinued     80 mg Irrigation On call 05/12/19 1131 05/12/19  1352   05/12/19 1145  ceFAZolin (ANCEF) 2-4 GM/100ML-% IVPB    Note to Pharmacy: Register, Karen   : cabinet override      05/12/19 1145 05/12/19 2359   05/12/19 0000  cephALEXin (KEFLEX) 250 MG capsule  Status:  Discontinued     250 mg Oral 4 times daily 05/12/19 1304 05/12/19    05/11/19 2230  ceFAZolin (ANCEF)  IVPB 2g/100 mL premix  Status:  Discontinued     2 g 200 mL/hr over 30 Minutes Intravenous On call 05/11/19 2224 05/12/19 1352   05/11/19 1000  levofloxacin (LEVAQUIN) tablet 250 mg     250 mg Oral Daily 05/10/19 1042 05/15/19 0959   05/10/19 1100  levofloxacin (LEVAQUIN) tablet 500 mg     500 mg Oral  Once 05/10/19 1042 05/10/19 1216   05/10/19 1030  levofloxacin (LEVAQUIN) tablet 500 mg  Status:  Discontinued     500 mg Oral Daily 05/10/19 1026 05/10/19 1042   05/09/19 0400  cefTRIAXone (ROCEPHIN) 1 g in sodium chloride 0.9 % 100 mL IVPB  Status:  Discontinued     1 g 200 mL/hr over 30 Minutes Intravenous Every 24 hours 05/08/19 0413 05/10/19 1026   05/09/19 0400  azithromycin (ZITHROMAX) 500 mg in sodium chloride 0.9 % 250 mL IVPB  Status:  Discontinued     500 mg 250 mL/hr over 60 Minutes Intravenous Every 24 hours 05/08/19 0413 05/10/19 1026   05/08/19 0315  cefTRIAXone (ROCEPHIN) 1 g in sodium chloride 0.9 % 100 mL IVPB     1 g 200 mL/hr over 30 Minutes Intravenous  Once 05/08/19 0310 05/08/19 1059   05/08/19 0315  azithromycin (ZITHROMAX) 500 mg in sodium chloride 0.9 % 250 mL IVPB     500 mg 250 mL/hr over 60 Minutes Intravenous  Once 05/08/19 0310 05/08/19 1059        Hypertension. -continue Entresto, Coreg and amlodipine.  Dyslipidemia.  -Continue statin therapy.  Depression. We will continue Zoloft.  Wide-complex tachycardia and Bradycardia:Consulted [place] cards. ICD GENERATOR CHANGE was done 05/12/19.  Arteriosclerosis of coronary artery: no CP. -on statin and ASA  Chronic kidney disease (CKD), stage III (moderate): stable. -f/u by BMP  Chronic systolic heart failure ([place]): no sign of fluid overload.  EF 30-35% -continue Entresto and carvedilol.  Hyperphosphatemia: phosphorus 4.8. pt was treated with calcium acetate (PHOSLO) capsule 667 mg x 2 doses. -need to check phosphorus level at f/u visit   Hypermagnesemia: mg 2.9 -will repeat  Mg in AM   DVT prophylaxis: SCD Code Status: DNR Family Communication: son is at bedside Disposition Plan: most likely tomorrow. Barriers for discharge: pt developed hyperphosphatemia and hypermagnesemia. Will need to repeat the lab in tomorrow morning to make sure his his electrolytes of phosphorus and magnesium okay   Consultants:   Cardiology  Pulmonology    Procedures: 11/18: ICD GENERATOR CHANGE was done    Antimicrobials: Anti-infectives (From admission, onward)   Start     Dose/Rate Route Frequency Ordered Stop   05/13/19 0000  levofloxacin (LEVAQUIN) 250 MG tablet  Status:  Discontinued     250 mg Oral Daily 05/12/19 1640 05/12/19    05/13/19 0000  levofloxacin (LEVAQUIN) 250 MG tablet     250 mg Oral Daily 05/12/19 1646     05/12/19 1145  gentamicin (GARAMYCIN) 80 mg in sodium chloride 0.9 % 500 mL irrigation  Status:  Discontinued     80 mg Irrigation On call 05/12/19 1131 05/12/19 1352  05/12/19 1145  ceFAZolin (ANCEF) 2-4 GM/100ML-% IVPB    Note to Pharmacy: Register, Karen   : cabinet override      05/12/19 1145 05/12/19 2359   05/12/19 0000  cephALEXin (KEFLEX) 250 MG capsule  Status:  Discontinued     250 mg Oral 4 times daily 05/12/19 1304 05/12/19    05/11/19 2230  ceFAZolin (ANCEF) IVPB 2g/100 mL premix  Status:  Discontinued     2 g 200 mL/hr over 30 Minutes Intravenous On call 05/11/19 2224 05/12/19 1352   05/11/19 1000  levofloxacin (LEVAQUIN) tablet 250 mg     250 mg Oral Daily 05/10/19 1042 05/15/19 0959   05/10/19 1100  levofloxacin (LEVAQUIN) tablet 500 mg     500 mg Oral  Once 05/10/19 1042 05/10/19 1216   05/10/19 1030  levofloxacin (LEVAQUIN) tablet 500 mg  Status:  Discontinued     500 mg Oral Daily 05/10/19 1026 05/10/19 1042   05/09/19 0400  cefTRIAXone (ROCEPHIN) 1 g in sodium chloride 0.9 % 100 mL IVPB  Status:  Discontinued     1 g 200 mL/hr over 30 Minutes Intravenous Every 24 hours 05/08/19 0413 05/10/19 1026   05/09/19 0400   azithromycin (ZITHROMAX) 500 mg in sodium chloride 0.9 % 250 mL IVPB  Status:  Discontinued     500 mg 250 mL/hr over 60 Minutes Intravenous Every 24 hours 05/08/19 0413 05/10/19 1026   05/08/19 0315  cefTRIAXone (ROCEPHIN) 1 g in sodium chloride 0.9 % 100 mL IVPB     1 g 200 mL/hr over 30 Minutes Intravenous  Once 05/08/19 0310 05/08/19 1059   05/08/19 0315  azithromycin (ZITHROMAX) 500 mg in sodium chloride 0.9 % 250 mL IVPB     500 mg 250 mL/hr over 60 Minutes Intravenous  Once 05/08/19 0310 05/08/19 1059         Objective: Vitals:   05/12/19 1318 05/12/19 1333 05/12/19 1413 05/12/19 1509  BP: (!) 98/56 103/79 110/62 129/69  Pulse: (!) 56 (!) 44 72 65  Resp: 20 12 17 18   Temp:  (!) 97.4 F (36.3 C) 97.6 F (36.4 C) 98.9 F (37.2 C)  TempSrc:   Oral Oral  SpO2: 100% 99% 99% 98%  Weight:      Height:        Intake/Output Summary (Last 24 hours) at 05/12/2019 1711 Last data filed at 05/12/2019 1359 Gross per 24 hour  Intake 388.34 ml  Output 500 ml  Net -111.66 ml   Filed Weights   05/07/19 2325 05/12/19 1124  Weight: 86.2 kg 86 kg    Examination:  Physical Exam:  General: Not in acute distress HEENT: PERRL, EOMI, no scleral icterus, No JVD or bruit Cardiac: S1/S2, RRR, No murmurs, gallops or rubs Pulm: No rales, wheezing, rhonchi or rubs. Abd: Soft, nondistended, nontender, no rebound pain, no organomegaly, BS present Ext: No edema. 2+DP/PT pulse bilaterally Musculoskeletal: No joint deformities, erythema, or stiffness, ROM full Skin: No rashes.  Neuro: Alert and oriented X3, cranial nerves II-XII grossly intact, moves all extremeties normally. Psych: Patient is not psychotic, no suicidal or hemocidal ideation.    Data Reviewed: I have personally reviewed following labs and imaging studies  CBC: Recent Labs  Lab 05/07/19 2331 05/08/19 0653 05/09/19 0458  WBC 11.1* 11.3* 11.7*  NEUTROABS  --   --  10.2*  HGB 13.8 13.2 12.6*  HCT 43.6 40.4 39.5   MCV 89.5 86.5 88.4  PLT 147* 192 371   Basic Metabolic  Panel: Recent Labs  Lab 05/08/19 0653 05/09/19 0458 05/10/19 0754 05/11/19 0533 05/12/19 0524  NA 141 141 140 140 140  K 4.1 3.8 4.3 4.2 4.2  CL 102 107 106 106 103  CO2 29 25 25 27 25   GLUCOSE 140* 153* 155* 130* 131*  BUN 34* 44* 62* 71* 80*  CREATININE 1.52* 1.45* 1.65* 1.74* 1.71*  CALCIUM 8.8* 8.5* 8.5* 8.6* 8.2*  MG  --   --  2.8* 2.7* 2.9*  PHOS  --   --  5.2* 4.2 4.8*   GFR: Estimated Creatinine Clearance: 39.1 mL/min (A) (by C-G formula based on SCr of 1.71 mg/dL (H)). Liver Function Tests: Recent Labs  Lab 05/08/19 0030  AST 15  ALT 13  ALKPHOS 76  BILITOT 0.5  PROT 6.9  ALBUMIN 3.6   No results for input(s): LIPASE, AMYLASE in the last 168 hours. No results for input(s): AMMONIA in the last 168 hours. Coagulation Profile: No results for input(s): INR, PROTIME in the last 168 hours. Cardiac Enzymes: No results for input(s): CKTOTAL, CKMB, CKMBINDEX, TROPONINI in the last 168 hours. BNP (last 3 results) Recent Labs    09/10/18 0957  PROBNP 5,918*   HbA1C: No results for input(s): HGBA1C in the last 72 hours. CBG: No results for input(s): GLUCAP in the last 168 hours. Lipid Profile: No results for input(s): CHOL, HDL, LDLCALC, TRIG, CHOLHDL, LDLDIRECT in the last 72 hours. Thyroid Function Tests: No results for input(s): TSH, T4TOTAL, FREET4, T3FREE, THYROIDAB in the last 72 hours. Anemia Panel: No results for input(s): VITAMINB12, FOLATE, FERRITIN, TIBC, IRON, RETICCTPCT in the last 72 hours. Sepsis Labs: No results for input(s): PROCALCITON, LATICACIDVEN in the last 168 hours.  Recent Results (from the past 240 hour(s))  Expectorated sputum assessment w rflx to resp cult     Status: None   Collection Time: 05/08/19  6:53 AM   Specimen: Sputum  Result Value Ref Range Status   Specimen Description SPUTUM  Final   Special Requests NONE  Final   Sputum evaluation   Final    THIS SPECIMEN  IS ACCEPTABLE FOR SPUTUM CULTURE Performed at [facility], [address], [place], [place] [address]    Report Status 05/08/2019 FINAL  Final  Culture, respiratory     Status: None   Collection Time: 05/08/19  6:53 AM   Specimen: SPU  Result Value Ref Range Status   Specimen Description   Final    SPUTUM Performed at [facility], [address], [place], [place] [address]    Special Requests   Final    NONE Reflexed from [phone] [id number] Performed at [facility], [address] [place], [place] [address]    Gram Stain   Final    RARE WBC PRESENT, PREDOMINANTLY PMN RARE GRAM POSITIVE COCCI RARE GRAM NEGATIVE RODS    Culture   Final    FEW Consistent with normal respiratory flora. Performed at [facility], [place] [address] [place],  [address]    Report Status 05/10/2019 FINAL  Final  SARS CORONAVIRUS 2 (TAT 6-24 HRS) Nasopharyngeal Nasopharyngeal Swab     Status: None   Collection Time: 05/08/19  6:54 AM   Specimen: Nasopharyngeal Swab  Result Value Ref Range Status   SARS Coronavirus 2 NEGATIVE NEGATIVE Final    Comment: (NOTE) SARS-CoV-2 target nucleic acids are NOT DETECTED. The SARS-CoV-2 RNA is generally detectable in upper and lower respiratory specimens during the acute phase of infection. Negative results  do not preclude SARS-CoV-2 infection, do not rule out co-infections with other pathogens, and should not be used as the sole basis for treatment or other patient management decisions. Negative results must be combined with clinical observations, patient history, and epidemiological information. The expected result is Negative. Fact Sheet for Patients: SugarRoll.be Fact Sheet for Healthcare Providers: https://www.woods-mathews.com/ This test is not yet approved or cleared by the [place] FDA and  has been authorized for detection and/or diagnosis of SARS-CoV-2 by FDA under an  Emergency Use Authorization (EUA). This EUA will remain  in effect (meaning this test can be used) for the duration of the COVID-19 declaration under Section 56 4(b)(1) of the Act, 21 U.S.C. section 360bbb-3(b)(1), unless the authorization is terminated or revoked sooner. Performed at [facility], [place] [address] [place], [place] [address]   Surgical PCR screen     Status: None   Collection Time: 05/11/19  1:17 PM   Specimen: Nasal Mucosa; Nasal Swab  Result Value Ref Range Status   MRSA, PCR NEGATIVE NEGATIVE Final   Staphylococcus aureus NEGATIVE NEGATIVE Final    Comment: (NOTE) The Xpert SA Assay (FDA approved for NASAL specimens in patients 75 years of age and older), is one component of a comprehensive surveillance program. It is not intended to diagnose infection nor to guide or monitor treatment. Performed at [facility], [address] [place], [place] [address]       Radiology Studies: No results found.      Scheduled Meds: . amLODipine  5 mg Oral Daily  . calcium acetate  667 mg Oral BID WC  . carvedilol  6.25 mg Oral BID WC  . guaiFENesin  600 mg Oral BID  . Ipratropium-Albuterol  1 puff Inhalation QID  . levofloxacin  250 mg Oral Daily  . pravastatin  40 mg Oral QHS  . predniSONE  50 mg Oral Q breakfast  . sacubitril-valsartan  1 tablet Oral BID  . sertraline  50 mg Oral Daily   Continuous Infusions: . ceFAZolin       LOS: 4 days    Time spent: 30 min     Ivor Costa, DO [facility] PAGER is on AMION  If 7PM-7AM, please contact night-coverage [email or web address] Password [email or web address] 05/12/2019, 5:11 PM

## 2019-05-12 NOTE — Progress Notes (Addendum)
Physical Therapy Treatment Patient Details Name: Phillip Allison. MRN: [id number] DOB: [birth year] Today's Date: 05/12/2019    History of Present Illness Phillip Allison  is a 78 y.o. Caucasian male with a known history of CHF, COPD, depression, hypertension, coronary artery disease and dyslipidemia, who presented to the emergency room with acute onset of excessive cough with subsequent hemoptysis with pure blood that amounted to about 5 to 10 mL, with associated dyspnea and wheezing without fever or chills.    PT Comments    Pt in bed upon entry, pt's son in recliner. Pt waiting to go to procedure in a couple hours for ICD updates, but is agreeable to PT session. Pt AMB around unit again, still on baseline flow rate of 2L/min. Pt has improved gait stability this date, as well as improved SOB. Wet cough at end of session after return to bed. Pt does not need any UE use for balance this date. Pt not interested in additional strength and/or balance training this date. Pt appears to be nearing baseline.     Follow Up Recommendations  Home health PT     Equipment Recommendations  None recommended by PT    Recommendations for Other Services       Precautions / Restrictions Precautions Precautions: Fall Restrictions Weight Bearing Restrictions: No    Mobility  Bed Mobility Overal bed mobility: Independent                Transfers Overall transfer level: Modified independent Equipment used: None             General transfer comment: pt is cautious, but moves well with moderate effort  Ambulation/Gait   Gait Distance (Feet): 240 Feet Assistive device: None Gait Pattern/deviations: WFL(Within Functional Limits)     General Gait Details: Chief Strategy Officer manages O2 tank. AMB stability improved this date. DOE improved.   Stairs             Wheelchair Mobility    Modified Rankin (Stroke Patients Only)       Balance Overall balance assessment:  Independent;Mild deficits observed, not formally tested                                          Cognition Arousal/Alertness: Awake/alert Behavior During Therapy: WFL for tasks assessed/performed Overall Cognitive Status: Within Functional Limits for tasks assessed                                        Exercises      General Comments        Pertinent Vitals/Pain Pain Assessment: No/denies pain    Home Living                      Prior Function            PT Goals (current goals can now be found in the care plan section) Acute Rehab PT Goals Patient Stated Goal: DC back to home, resume normal activity PT Goal Formulation: With patient Time For Goal Achievement: 05/25/19 Potential to Achieve Goals: Fair Progress towards PT goals: Progressing toward goals    Frequency    Min 2X/week      PT Plan Current plan remains appropriate    Co-evaluation  AM-PAC PT "6 Clicks" Mobility   Outcome Measure  Help needed turning from your back to your side while in a flat bed without using bedrails?: None Help needed moving from lying on your back to sitting on the side of a flat bed without using bedrails?: None Help needed moving to and from a bed to a chair (including a wheelchair)?: None Help needed standing up from a chair using your arms (e.g., wheelchair or bedside chair)?: None Help needed to walk in hospital room?: A Little Help needed climbing 3-5 steps with a railing? : A Little 6 Click Score: 22    End of Session   Activity Tolerance: Patient tolerated treatment well;No increased pain;Patient limited by fatigue Patient left: in bed;with call bell/phone within reach;with bed alarm set;with family/visitor present   PT Visit Diagnosis: Unsteadiness on feet (R26.81);Difficulty in walking, not elsewhere classified (R26.2)     Time: [id number] PT Time Calculation (min) (ACUTE ONLY): 10 min  Charges:   $Therapeutic Exercise: 8-22 mins                     12:34 PM, 05/12/19 Etta Grandchild, PT, DPT Physical Therapist - [facility] [address]  [phone] ([place])    Buccola,Allan C 05/12/2019, 12:31 PM

## 2019-05-12 NOTE — Anesthesia Preprocedure Evaluation (Signed)
Anesthesia Evaluation  Patient identified by MRN, date of birth, ID band Patient awake    Reviewed: Allergy & Precautions, H&P , NPO status , Patient's Chart, lab work & pertinent test results  History of Anesthesia Complications Negative for: history of anesthetic complications  Airway Mallampati: III  TM Distance: >3 FB Neck ROM: limited    Dental  (+) Chipped, Poor Dentition, Missing   Pulmonary shortness of breath and with exertion, pneumonia, resolved, COPD,  COPD inhaler and oxygen dependent, former smoker,           Cardiovascular hypertension, (-) angina+ CAD, + Past MI and +CHF  + dysrhythmias Atrial Fibrillation + pacemaker      Neuro/Psych PSYCHIATRIC DISORDERS negative neurological ROS     GI/Hepatic Neg liver ROS, GERD  Medicated and Controlled,  Endo/Other  negative endocrine ROS  Renal/GU Renal disease  negative genitourinary   Musculoskeletal   Abdominal   Peds  Hematology negative hematology ROS (+)   Anesthesia Other Findings Past Medical History: No date: CHF (congestive heart failure) (HCC) No date: COPD (chronic obstructive pulmonary disease) (HCC) No date: Depression No date: Emphysema lung (HCC) No date: Hypercholesterolemia No date: Hypertension No date: Myocardial infarction ([place]) No date: On home oxygen therapy     Comment:  2 liters at night and as needed No date: Pneumonia     Comment:  in the past No date: Presence of permanent cardiac pacemaker No date: Shortness of breath dyspnea     Comment:  with exertion  Past Surgical History: No date: ABDOMINAL AORTIC ANEURYSM REPAIR No date: ANGIOPLASTY     Comment:  left lower extremity 12/18/2015: CATARACT EXTRACTION W/PHACO; Right     Comment:  Procedure: CATARACT EXTRACTION PHACO AND INTRAOCULAR               LENS PLACEMENT ([place]);  Surgeon: Estill Cotta, MD;                Location: [facility] ORS;  Service: Ophthalmology;   Laterality:              Right;  [place] 01:58 AP% 23.5 CDE 50.91 fluid pack lot #               [id number] No date: INSERT / REPLACE / REMOVE PACEMAKER No date: PACEMAKER PLACEMENT No date: TONSILLECTOMY  BMI    Body Mass Index: 26.50 kg/m      Reproductive/Obstetrics negative OB ROS                             Anesthesia Physical Anesthesia Plan  ASA: IV  Anesthesia Plan: General   Post-op Pain Management:    Induction: Intravenous  PONV Risk Score and Plan: Propofol infusion and TIVA  Airway Management Planned: Natural Airway and Nasal Cannula  Additional Equipment:   Intra-op Plan:   Post-operative Plan:   Informed Consent: I have reviewed the patients History and Physical, chart, labs and discussed the procedure including the risks, benefits and alternatives for the proposed anesthesia with the patient or authorized representative who has indicated his/her understanding and acceptance.     Dental Advisory Given  Plan Discussed with: Anesthesiologist, CRNA and Surgeon  Anesthesia Plan Comments: (Patient consented for risks of anesthesia including but not limited to:  - adverse reactions to medications - risk of intubation if required - damage to teeth, lips or other oral mucosa - sore throat or hoarseness - Damage to heart, brain,  lungs or loss of life  Patient voiced understanding.)        Anesthesia Quick Evaluation

## 2019-05-12 NOTE — Anesthesia Post-op Follow-up Note (Signed)
Anesthesia QCDR form completed.        

## 2019-05-12 NOTE — Op Note (Signed)
[facility] Cardiology   05/12/2019                     12:58 PM  PATIENT:  Phillip Allison.    PRE-OPERATIVE DIAGNOSIS:  ICD ERI  POST-OPERATIVE DIAGNOSIS:  Same  PROCEDURE:  ICD GENERATOR CHANGE  SURGEON:  Isaias Cowman, MD    ANESTHESIA:     PREOPERATIVE INDICATIONS:  Phillip Allison. is a  78 y.o. male with a diagnosis of ICD ERI who failed conservative measures and elected for surgical management.    The risks benefits and alternatives were discussed with the patient preoperatively including but not limited to the risks of infection, bleeding, cardiopulmonary complications, the need for revision surgery, among others, and the patient was willing to proceed.   OPERATIVE PROCEDURE: The patient was brought to the operating room in a fasting state.  The left pectoral region was prepped and draped in usual sterile manner.  Anesthesia was obtained 1% lidocaine locally.  A 6 cm incision was performed the left pectoral region.  The ICD generator was retrieved by electrocautery and blunt dissection.  Leads were disconnected and connected to a new MRI compatible dual-chamber ICD generator ( Medtronic Evera MRI DR D [id number] ).  The pacemaker pocket was irrigated with gentamicin solution.  The ICD generator was positioned into the pocket and the pocket was closed with 2-0 and 4-0 Vicryl, respectively.  Steri-Strips and pressure dressing were applied.  There were no periprocedural complications.  ICD interrogation revealed appropriate atrial and ventricular sensing and pacing thresholds.   ICD Criteria  Current LVEF:20%. Within 12 months prior to implant: No   Heart failure history: Yes, Class II  Cardiomyopathy history: Yes, Ischemic Cardiomyopathy - Prior MI.  Atrial Fibrillation/Atrial Flutter: Yes, Paroxysmal.  Ventricular tachycardia history: No.  Cardiac arrest history:  No.  History of syndromes with risk of sudden death: No.  Previous ICD: Yes, Reason for ICD:  Primary prevention.  Current ICD indication: Primary  PPM indication: Yes. Pacing type: Ventricular. Less than 40% RV pacing requirement anticipated. Indication: Sick Sinus Syndrome   Class I or II Bradycardia indication present: No  Beta Blocker therapy for 3 or more months: Yes, prescribed.   Ace Inhibitor/ARB therapy for 3 or more months: Yes, prescribed.

## 2019-05-12 NOTE — Transfer of Care (Signed)
Immediate Anesthesia Transfer of Care Note  Patient: Phillip Allison.  Procedure(s) Performed: ICD GENERATOR CHANGE (N/A Shoulder)  Patient Location: PACU  Anesthesia Type:MAC  Level of Consciousness: awake, drowsy and patient cooperative  Airway & Oxygen Therapy: Patient Spontanous Breathing and Patient connected to face mask oxygen  Post-op Assessment: Report given to RN and Post -op Vital signs reviewed and stable  Post vital signs: Reviewed and stable  Last Vitals:  Vitals Value Taken Time  BP 93/41 05/12/19 1307  Temp 36.3 C 05/12/19 1303  Pulse 59 05/12/19 1309  Resp 22 05/12/19 1309  SpO2 100 % 05/12/19 1309  Vitals shown include unvalidated device data.  Last Pain:  Vitals:   05/12/19 1303  TempSrc:   PainSc: (P) Asleep      Patients Stated Pain Goal: 0 (39/43/20 0379)  Complications: No apparent anesthesia complications

## 2019-05-13 ENCOUNTER — Encounter: Payer: Self-pay | Admitting: Cardiology

## 2019-05-13 DIAGNOSIS — E875 Hyperkalemia: Secondary | ICD-10-CM

## 2019-05-13 LAB — BASIC METABOLIC PANEL
Anion gap: 9 (ref 5–15)
Anion gap: 8 (ref 5–15)
BUN: 78 mg/dL — ABNORMAL HIGH (ref 8–23)
BUN: 76 mg/dL — ABNORMAL HIGH (ref 8–23)
CO2: 28 mmol/L (ref 22–32)
CO2: 26 mmol/L (ref 22–32)
Calcium: 8.6 mg/dL — ABNORMAL LOW (ref 8.9–10.3)
Calcium: 8.2 mg/dL — ABNORMAL LOW (ref 8.9–10.3)
Chloride: 102 mmol/L (ref 98–111)
Chloride: 103 mmol/L (ref 98–111)
Creatinine, Ser: 1.9 mg/dL — ABNORMAL HIGH (ref 0.61–1.24)
Creatinine, Ser: 1.74 mg/dL — ABNORMAL HIGH (ref 0.61–1.24)
GFR calc Af Amer: 39 mL/min — ABNORMAL LOW (ref >60)
GFR calc Af Amer: 43 mL/min — ABNORMAL LOW (ref >60)
GFR calc non Af Amer: 33 mL/min — ABNORMAL LOW (ref >60)
GFR calc non Af Amer: 37 mL/min — ABNORMAL LOW (ref >60)
Glucose, Bld: 155 mg/dL — ABNORMAL HIGH (ref 70–99)
Glucose, Bld: 183 mg/dL — ABNORMAL HIGH (ref 70–99)
Potassium: 5.2 mmol/L — ABNORMAL HIGH (ref 3.5–5.1)
Potassium: 4.7 mmol/L (ref 3.5–5.1)
Sodium: 139 mmol/L (ref 135–145)
Sodium: 137 mmol/L (ref 135–145)

## 2019-05-13 LAB — MAGNESIUM
Magnesium: 3.3 mg/dL — ABNORMAL HIGH (ref 1.7–2.4)
Magnesium: 3.2 mg/dL — ABNORMAL HIGH (ref 1.7–2.4)

## 2019-05-13 LAB — PHOSPHORUS: Phosphorus: 5.8 mg/dL — ABNORMAL HIGH (ref 2.5–4.6)

## 2019-05-13 LAB — CK: Total CK: 30 U/L — ABNORMAL LOW (ref 49–397)

## 2019-05-13 MED ORDER — SODIUM ZIRCONIUM CYCLOSILICATE 5 G PO PACK
5.0000 g | PACK | Freq: Once | ORAL | Status: AC
  Administered 2019-05-13: 5 g via ORAL
  Filled 2019-05-13: qty 1

## 2019-05-13 MED ORDER — SODIUM CHLORIDE 0.9 % IV SOLN
INTRAVENOUS | Status: DC
  Administered 2019-05-13 (×2): via INTRAVENOUS

## 2019-05-13 NOTE — Progress Notes (Signed)
D: Pt alert and oriented. Pt denies experiencing any pain at this time. Pt voice being ready to discharge and go home.  A: Pt and son received discharge and medication education/information. Pt belongings were gathered and taken with them upon discharge. Pt received implant manual along with identification card.  R: Pt and son verbalized understanding of discharge and medication education/information.  Pt escorted to medical mall front lobby via wheelchair by staff where pt's son picked him up.

## 2019-05-13 NOTE — Consult Note (Signed)
PHARMACY CONSULT NOTE - FOLLOW UP  Pharmacy Consult for Electrolyte Monitoring and Replacement   Recent Labs: Potassium (mmol/L)  Date Value  05/13/2019 5.2 (H)  07/21/2012 4.4   Magnesium (mg/dL)  Date Value  05/13/2019 3.3 (H)  12/19/2011 1.9   Calcium (mg/dL)  Date Value  05/13/2019 8.6 (L)   Calcium, Total (mg/dL)  Date Value  07/21/2012 9.0   Albumin (g/dL)  Date Value  05/08/2019 3.6  09/10/2018 3.4 (L)  12/19/2011 2.9 (L)   Phosphorus (mg/dL)  Date Value  05/13/2019 5.8 (H)  12/19/2011 3.4   Sodium (mmol/L)  Date Value  05/13/2019 139  09/10/2018 141  07/21/2012 138     Assessment: Pharmacy consulted to monitor and replace electrolytes in 77yo patient.   Goal of Therapy:  Electrolytes WNL  Plan:  K 5.2, Mg 3.3, Phos 5.8 -In speaking with hospitalist, has placed a nephrology consult due to electrolyte abnormalities. Has been ordered Lokelma 5mg  x 1 dose to help lower potassium. Any further orders shall be placed by nephrologist. -Will recheck levels with AM labs tomorrow morning.  Pearla Dubonnet ,PharmD Clinical Pharmacist 05/13/2019 2:10 PM

## 2019-05-13 NOTE — Care Management Important Message (Signed)
Important Message  Patient Details  Name: Phillip Allison. MRN: [id number] Date of Birth: [birth year]   Medicare Important Message Given:  Yes     [name] 05/13/2019, 11:07 AM

## 2019-05-13 NOTE — Consult Note (Signed)
[address] [place], [place] [address] Phone [phone]. Fax [phone]  Date: 05/13/2019                  [name].  MRN: [id number]  DOB: [birth year]  Age / Sex: 78 y.o., male         PCP: Jerrol Banana., MD                 Service Requesting Consult: IM/ Ivor Costa, MD                 Reason for Consult: CKD, Hyperphosphatemia            History of Present Illness: Patient is a 78 y.o. male with medical problems as listed below , who was admitted to [facility] on 05/07/2019 for evaluation of hemoptysis which is deemed to be secondary to acute bronchitis.  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  Baseline creatinine of 1.45 from November 15.  Creatinine has increased to 1.90 today  Patient denies any acute complaints.  He is wishing to go home today  Medications: Outpatient medications: Medications Prior to Admission  Medication Sig Dispense Refill Last Dose  . albuterol (PROVENTIL) (2.5 MG/3ML) 0.083% nebulizer solution USE 1 VIAL VIA NEBULIZER EVERY 6 HOURS AS NEEDED FOR WHEEZING OR SHORTNESS OF BREATH (Patient taking differently: Take 2.5 mg by nebulization every 6 (six) hours as needed for wheezing or shortness of breath. ) 180 mL 3 prn at prn  . amLODipine (NORVASC) 5 MG tablet Take 1 tablet (5 mg total) by mouth daily. 30 tablet 1 05/07/2019 at Unknown time  . aspirin 81 MG tablet Take 81 mg by mouth at bedtime.    05/07/2019 at Unknown time  . carvedilol (COREG) 12.5 MG tablet Take 12.5 mg by mouth 2 (two) times daily with a meal.    05/07/2019 at 2200  . ENTRESTO 24-26 MG Take 1  tablet by mouth 2 (two) times daily.   05/07/2019 at 2200  . pravastatin (PRAVACHOL) 40 MG tablet Take 1 tablet (40 mg total) by mouth at bedtime. 90 tablet 3 05/07/2019 at Unknown time  . sertraline (ZOLOFT) 50 MG tablet Take 1 tablet (50 mg total) by mouth daily. 30 tablet 11 05/07/2019 at Unknown time    Current medications: Current Facility-Administered Medications  Medication Dose Route Frequency Provider Last Rate Last Dose  . 0.9 %  sodium chloride infusion   Intravenous Continuous Ivor Costa, MD 75 mL/hr at 05/13/19 1046    . acetaminophen (TYLENOL) tablet 650 mg  650 mg Oral Q6H PRN Mansy, Jan A, MD       Or  . acetaminophen (TYLENOL) suppository 650 mg  650 mg Rectal Q6H PRN Mansy, Jan A, MD      . albuterol (PROVENTIL) (2.5 MG/3ML) 0.083% nebulizer solution 2.5 mg  2.5 mg Nebulization Q6H PRN Mansy, Jan A, MD      . amLODipine (NORVASC) tablet 5 mg  5 mg Oral Daily Mansy, Jan A, MD   5 mg at 05/13/19 1034  . carvedilol (COREG) tablet 6.25 mg  6.25 mg Oral BID WC Nazari, Walid A, RPH   6.25 mg at 05/13/19 1034  .  chlorpheniramine-HYDROcodone (TUSSIONEX) 10-8 MG/5ML suspension 5 mL  5 mL Oral Q12H PRN Mansy, Jan A, MD      . guaiFENesin (MUCINEX) 12 hr tablet 600 mg  600 mg Oral BID Mansy, Jan A, MD   600 mg at 05/13/19 1034  . Ipratropium-Albuterol (COMBIVENT) respimat 1 puff  1 puff Inhalation QID [name], RPH   1 puff at 05/13/19 1317  . levofloxacin (LEVAQUIN) tablet 250 mg  250 mg Oral Daily Rito Ehrlich A, RPH   250 mg at 05/13/19 1035  . magnesium hydroxide (MILK OF MAGNESIA) suspension 30 mL  30 mL Oral Daily PRN Mansy, Jan A, MD      . ondansetron [facility]) tablet 4 mg  4 mg Oral Q6H PRN Mansy, Jan A, MD       Or  . ondansetron [facility]) injection 4 mg  4 mg Intravenous Q6H PRN Mansy, Jan A, MD      . pravastatin (PRAVACHOL) tablet 40 mg  40 mg Oral QHS Mansy, Jan A, MD   40 mg at 05/12/19 2105  . predniSONE (DELTASONE) tablet 50 mg  50 mg Oral Q breakfast  Ottie Glazier, MD   50 mg at 05/13/19 1034  . sacubitril-valsartan (ENTRESTO) 24-26 mg per tablet  1 tablet Oral BID Mansy, Jan A, MD   1 tablet at 05/13/19 1035  . sertraline (ZOLOFT) tablet 50 mg  50 mg Oral Daily Mansy, Jan A, MD   50 mg at 05/13/19 1034  . traZODone (DESYREL) tablet 25 mg  25 mg Oral QHS PRN Mansy, Arvella Merles, MD          Allergies: No Known Allergies    Past Medical History: Past Medical History:  Diagnosis Date  . CHF (congestive heart failure) ([place])   . COPD (chronic obstructive pulmonary disease) ([place])   . Depression   . Emphysema lung ([place])   . Hypercholesterolemia   . Hypertension   . Myocardial infarction ([place])   . On home oxygen therapy    2 liters at night and as needed  . Pneumonia    in the past  . Presence of permanent cardiac pacemaker   . Shortness of breath dyspnea    with exertion     Past Surgical History: Past Surgical History:  Procedure Laterality Date  . ABDOMINAL AORTIC ANEURYSM REPAIR    . ANGIOPLASTY     left lower extremity  . CATARACT EXTRACTION W/PHACO Right 12/18/2015   Procedure: CATARACT EXTRACTION PHACO AND INTRAOCULAR LENS PLACEMENT (IOC);  Surgeon: Estill Cotta, MD;  Location: [facility] ORS;  Service: Ophthalmology;  Laterality: Right;  [place] 01:58 AP% 23.5 CDE 50.91 fluid pack lot # [id number]  . IMPLANTABLE CARDIOVERTER DEFIBRILLATOR (ICD) GENERATOR CHANGE N/A 05/12/2019   Procedure: ICD GENERATOR CHANGE;  Surgeon: Isaias Cowman, MD;  Location: [facility] ORS;  Service: Cardiovascular;  Laterality: N/A;  . INSERT / REPLACE / REMOVE PACEMAKER    . PACEMAKER PLACEMENT    . TONSILLECTOMY       Family History: Family History  Problem Relation Age of Onset  . Psoriasis Mother   . Heart attack Father      Social History: Social History   Socioeconomic History  . Marital status: Widowed    Spouse name: Not on file  . Number of children: 3  . Years of education: Not on file  . Highest education level: Associate  degree: occupational, Hotel manager, or vocational program  Occupational History  . Occupation: retired  Scientific laboratory technician  .  Financial resource strain: Not hard at all  . Food insecurity    Worry: Never true    Inability: Never true  . Transportation needs    Medical: No    Non-medical: No  Tobacco Use  . Smoking status: Former Smoker    Packs/day: 0.75    Years: 54.00    Pack years: 40.50    Types: Cigarettes    Quit date: 2014    Years since quitting: 6.8  . Smokeless tobacco: Never Used  . Tobacco comment: 2014?  Substance and Sexual Activity  . Alcohol use: No  . Drug use: No  . Sexual activity: Not Currently  Lifestyle  . Physical activity    Days per week: 0 days    Minutes per session: 0 min  . Stress: Only a little  Relationships  . Social Herbalist on phone: Patient refused    Gets together: Patient refused    Attends religious service: Patient refused    Active member of club or organization: Patient refused    Attends meetings of clubs or organizations: Patient refused    Relationship status: Patient refused  . Intimate partner violence    Fear of current or ex partner: Patient refused    Emotionally abused: Patient refused    Physically abused: Patient refused    Forced sexual activity: Patient refused  Other Topics Concern  . Not on file  Social History Narrative  . Not on file     Review of Systems: Gen: Denies any acute complaints HEENT: Decreased hearing CV: No chest pain or shortness of breath Resp: Hemoptysis has resolved GI: Appetite is fair per patient although son reports that his appetite is not good. No weight loss GU : Denies any hematuria or problems with voiding MS: No acute complaints Derm:    No complaints Psych: No complaints Heme: No complaints Neuro: No complaints Endocrine.  No complaints  Vital Signs: Blood pressure (!) 141/75, pulse 69, temperature 98.5 F (36.9 C), resp. rate 17, height 5\' 11"  (1.803 m), weight 86  kg, SpO2 100 %.   Intake/Output Summary (Last 24 hours) at 05/13/2019 1402 Last data filed at 05/13/2019 1248 Gross per 24 hour  Intake 240 ml  Output 1050 ml  Net -810 ml    Weight trends: Filed Weights   05/07/19 2325 05/12/19 1124  Weight: 86.2 kg 86 kg    Physical Exam: General:  Elderly frail gentleman, laying in the bed  HEENT  anicteric, moist oral mucous membranes  Lungs:  Normal breathing effort, clear to auscultation  Heart::  No rub or gallop  Abdomen:  Soft, nontender  Extremities:  No edema  Neurologic:  Alert, oriented  Skin:  No acute rashes    Lab results: Basic Metabolic Panel: Recent Labs  Lab 05/11/19 0533 05/12/19 0524 05/13/19 0423  NA 140 140 139  K 4.2 4.2 5.2*  CL 106 103 102  CO2 27 25 28   GLUCOSE 130* 131* 155*  BUN 71* 80* 78*  CREATININE 1.74* 1.71* 1.90*  CALCIUM 8.6* 8.2* 8.6*  MG 2.7* 2.9* 3.3*  PHOS 4.2 4.8* 5.8*    Liver Function Tests: Recent Labs  Lab 05/08/19 0030  AST 15  ALT 13  ALKPHOS 76  BILITOT 0.5  PROT 6.9  ALBUMIN 3.6   No results for input(s): LIPASE, AMYLASE in the last 168 hours. No results for input(s): AMMONIA in the last 168 hours.  CBC: Recent Labs  Lab 05/08/19 0653 05/09/19  0458  WBC 11.3* 11.7*  NEUTROABS  --  10.2*  HGB 13.2 12.6*  HCT 40.4 39.5  MCV 86.5 88.4  PLT 192 195    Cardiac Enzymes: No results for input(s): CKTOTAL, TROPONINI in the last 168 hours.  BNP: Invalid input(s): POCBNP  CBG: No results for input(s): GLUCAP in the last 168 hours.  Microbiology: Recent Results (from the past 720 hour(s))  Expectorated sputum assessment w rflx to resp cult     Status: None   Collection Time: 05/08/19  6:53 AM   Specimen: Sputum  Result Value Ref Range Status   Specimen Description SPUTUM  Final   Special Requests NONE  Final   Sputum evaluation   Final    THIS SPECIMEN IS ACCEPTABLE FOR SPUTUM CULTURE Performed at [facility], [address]  [place], [place] [address]    Report Status 05/08/2019 FINAL  Final  Culture, respiratory     Status: None   Collection Time: 05/08/19  6:53 AM   Specimen: SPU  Result Value Ref Range Status   Specimen Description   Final    SPUTUM Performed at [facility], [address], [place], [place] [address]    Special Requests   Final    NONE Reflexed from [id number] Performed at [facility], [address] [place], [place] [address]    Gram Stain   Final    RARE WBC PRESENT, PREDOMINANTLY PMN RARE GRAM POSITIVE COCCI RARE GRAM NEGATIVE RODS    Culture   Final    FEW Consistent with normal respiratory flora. Performed at [facility], [place] [address], [place], [place] [address]    Report Status 05/10/2019 FINAL  Final  SARS CORONAVIRUS 2 (TAT 6-24 HRS) Nasopharyngeal Nasopharyngeal Swab     Status: None   Collection Time: 05/08/19  6:54 AM   Specimen: Nasopharyngeal Swab  Result Value Ref Range Status   SARS Coronavirus 2 NEGATIVE NEGATIVE Final    Comment: (NOTE) SARS-CoV-2 target nucleic acids are NOT DETECTED. The SARS-CoV-2 RNA is generally detectable in upper and lower respiratory specimens during the acute phase of infection. Negative results do not preclude SARS-CoV-2 infection, do not rule out co-infections with other pathogens, and should not be used as the sole basis for treatment or other patient management decisions. Negative results must be combined with clinical observations, patient history, and epidemiological information. The expected result is Negative. Fact Sheet for Patients: SugarRoll.be Fact Sheet for Healthcare Providers: https://www.woods-mathews.com/ This test is not yet approved or cleared by the [place] FDA and  has been authorized for detection and/or diagnosis of SARS-CoV-2 by FDA under an Emergency Use Authorization (EUA). This EUA will remain  in effect (meaning this test can be used)  for the duration of the COVID-19 declaration under Section 56 4(b)(1) of the Act, 21 U.S.C. section 360bbb-3(b)(1), unless the authorization is terminated or revoked sooner. Performed at [facility], [place] [address], [place], [place] [address]   Surgical PCR screen     Status: None   Collection Time: 05/11/19  1:17 PM   Specimen: Nasal Mucosa; Nasal Swab  Result Value Ref Range Status   MRSA, PCR NEGATIVE NEGATIVE Final   Staphylococcus aureus NEGATIVE NEGATIVE Final    Comment: (NOTE) The Xpert SA Assay (FDA approved for NASAL specimens in patients 18 years of age and older), is one component of a comprehensive surveillance program. It is not intended to diagnose infection nor to guide or monitor  treatment. Performed at [facility], [address] [place], [place] [address]      Coagulation Studies: No results for input(s): LABPROT, INR in the last 72 hours.  Urinalysis: No results for input(s): COLORURINE, LABSPEC, PHURINE, GLUCOSEU, HGBUR, BILIRUBINUR, KETONESUR, PROTEINUR, UROBILINOGEN, NITRITE, LEUKOCYTESUR in the last 72 hours.  Invalid input(s): APPERANCEUR      Imaging:  No results found.   Assessment & Plan: Pt is a 78 y.o. Caucasian   male with CHF, COPD, depression, hypertension, coronary disease, dyslipidemia, was admitted on 05/07/2019 with excessive cough, hemoptysis, dyspnea and wheezing.  Diagnosed with an abscess secondary to acute bronchitis and COPD exacerbation.  Treated with antibiotics.  #1.  Acute kidney injury on chronic kidney disease stage IIIb  Baseline creatinine of 1.45/GFR 46 from November 15 Today's creatinine is increased to 1.90. Acute kidney injury is likely secondary to IV contrast exposure for CT angiography Patient is nonoliguric Electrolytes and volume status are acceptable Currently maintained on amlodipine, carvedilol, Entresto Patient did have hypotensive episode on November 18.  Blood pressure was down to  83/43 Hold entresto for 1 week Recommend repeat BMP to see if there is any improvement  #2.  Hyperphosphatemia PTH level not available Possibly related to secondary hyperparathyroidism.  We will follow-up as outpatient Avoid high phosphorus foods such as dairy, nuts, chocolate, dark soda  3.  Hypermagnesemia -Unclear etiology.  Asked patient to avoid magnesium oxide or over-the-counter milk of magnesia  -We will follow-up as outpatient    LOS: [place] 11/19/20202:02 PM    Note: This note was prepared with Dragon dictation. Any transcription errors are unintentional

## 2019-05-13 NOTE — Discharge Instructions (Signed)
You were cared for by a hospitalist during your hospital stay. If you have any questions about your discharge medications or the care you received while you were in the hospital after you are discharged, you can call the unit and ask to speak with the hospitalist on call if the hospitalist that took care of you is not available. Once you are discharged, your primary care physician will handle any further medical issues. Please note that NO REFILLS for any discharge medications will be authorized once you are discharged, as it is imperative that you return to your primary care physician (or establish a relationship with a primary care physician if you do not have one) for your aftercare needs so that they can reassess your need for medications and monitor your lab values.  Follow up with PCP, cardiologist, and nephrologist. Take all medications as prescribed. If symptoms change or worsen please return to the ED for evaluation.  Please do not take your Entresto for 7 days, you need to follow up with Dr. Candiss Norse of nephrology in 1 week. He will tell you if you should restart taking Entresto.

## 2019-05-25 DIAGNOSIS — J449 Chronic obstructive pulmonary disease, unspecified: Secondary | ICD-10-CM | POA: Diagnosis not present

## 2019-05-31 DIAGNOSIS — I10 Essential (primary) hypertension: Secondary | ICD-10-CM | POA: Diagnosis not present

## 2019-05-31 DIAGNOSIS — I2510 Atherosclerotic heart disease of native coronary artery without angina pectoris: Secondary | ICD-10-CM | POA: Diagnosis not present

## 2019-05-31 DIAGNOSIS — I739 Peripheral vascular disease, unspecified: Secondary | ICD-10-CM | POA: Diagnosis not present

## 2019-05-31 DIAGNOSIS — E782 Mixed hyperlipidemia: Secondary | ICD-10-CM | POA: Diagnosis not present

## 2019-05-31 DIAGNOSIS — I454 Nonspecific intraventricular block: Secondary | ICD-10-CM | POA: Diagnosis not present

## 2019-05-31 DIAGNOSIS — I5022 Chronic systolic (congestive) heart failure: Secondary | ICD-10-CM | POA: Diagnosis not present

## 2019-05-31 DIAGNOSIS — I493 Ventricular premature depolarization: Secondary | ICD-10-CM | POA: Diagnosis not present

## 2019-05-31 DIAGNOSIS — I714 Abdominal aortic aneurysm, without rupture: Secondary | ICD-10-CM | POA: Diagnosis not present

## 2019-05-31 DIAGNOSIS — I255 Ischemic cardiomyopathy: Secondary | ICD-10-CM | POA: Diagnosis not present

## 2019-06-03 ENCOUNTER — Telehealth: Payer: Self-pay

## 2019-06-03 NOTE — Telephone Encounter (Signed)
Spoke with pt to inform him that it is time for his annual lung cancer screening. Confirmed pt's smoking history (40.5 pack years, former smoker). Pt denies any major health changes in the last year. Pt states that any day of the week is ok for appts for him except for Wednesdays. Pt also states that he does not prefer early morning appts. Pt is agreeable to having CT scan scheduled at this time.

## 2019-06-07 NOTE — Progress Notes (Deleted)
       Patient: Phillip Allison. Male    DOB: Sep 27, 1940   78 y.o.   MRN: [id number] Visit Date: 06/07/2019  Today's Provider: Wilhemena Durie, MD   No chief complaint on file.  Subjective:     HPI    Follow up Hospitalization  Patient was admitted to [facility] on 05/07/2019 and discharged on 05/13/2019. He was treated for; Acute on chronic respiratory failure with hypoxia and hypercapnia . Treatment for this included; treated with antibiotics. Telephone follow up was done on n/a He reports {DESC; EXCELLENT/GOOD/FAIR:19992} compliance with treatment. He reports this condition is {improved/worse/unchanged:3041574}.  ------------------------------------------------------------------------------------     No Known Allergies   Current Outpatient Medications:  .  albuterol (PROVENTIL) (2.5 MG/3ML) 0.083% nebulizer solution, USE 1 VIAL VIA NEBULIZER EVERY 6 HOURS AS NEEDED FOR WHEEZING OR SHORTNESS OF BREATH (Patient taking differently: Take 2.5 mg by nebulization every 6 (six) hours as needed for wheezing or shortness of breath. ), Disp: 180 mL, Rfl: 3 .  amLODipine (NORVASC) 5 MG tablet, Take 1 tablet (5 mg total) by mouth daily., Disp: 30 tablet, Rfl: 1 .  aspirin 81 MG tablet, Take 81 mg by mouth at bedtime. , Disp: , Rfl:  .  carvedilol (COREG) 6.25 MG tablet, Take 1 tablet (6.25 mg total) by mouth 2 (two) times daily with a meal., Disp: 60 tablet, Rfl: 1 .  ENTRESTO 24-26 MG, Take 1 tablet by mouth 2 (two) times daily., Disp: , Rfl:  .  guaiFENesin (MUCINEX) 600 MG 12 hr tablet, Take 1 tablet (600 mg total) by mouth 2 (two) times daily., Disp: 30 tablet, Rfl: 0 .  levofloxacin (LEVAQUIN) 250 MG tablet, Take 1 tablet (250 mg total) by mouth daily., Disp: 3 tablet, Rfl: 0 .  pravastatin (PRAVACHOL) 40 MG tablet, Take 1 tablet (40 mg total) by mouth at bedtime., Disp: 90 tablet, Rfl: 3 .  predniSONE (DELTASONE) 10 MG tablet, Take 1 tablet (10 mg total) by mouth daily.,  Disp: 20 tablet, Rfl: 0 .  sertraline (ZOLOFT) 50 MG tablet, Take 1 tablet (50 mg total) by mouth daily., Disp: 30 tablet, Rfl: 11  Review of Systems  Constitutional: Negative for appetite change, chills and fever.  Respiratory: Negative for chest tightness, shortness of breath and wheezing.   Cardiovascular: Negative for chest pain and palpitations.  Gastrointestinal: Negative for abdominal pain, nausea and vomiting.    Social History   Tobacco Use  . Smoking status: Former Smoker    Packs/day: 0.75    Years: 54.00    Pack years: 40.50    Types: Cigarettes    Quit date: 2014    Years since quitting: 6.9  . Smokeless tobacco: Never Used  . Tobacco comment: 2014?  Substance Use Topics  . Alcohol use: No      Objective:   There were no vitals taken for this visit. There were no vitals filed for this visit.There is no height or weight on file to calculate BMI.   Physical Exam   No results found for any visits on 06/08/19.     Assessment & Plan        Wilhemena Durie, MD  [facility]

## 2019-06-08 ENCOUNTER — Inpatient Hospital Stay: Payer: Medicare Other | Admitting: Family Medicine

## 2019-06-09 NOTE — Telephone Encounter (Signed)
Reports had recent CT scan and has follow up imaging planned with Dr. Lanney Gins in February. Will follow up after that.

## 2019-06-28 DIAGNOSIS — E782 Mixed hyperlipidemia: Secondary | ICD-10-CM | POA: Diagnosis not present

## 2019-06-29 ENCOUNTER — Other Ambulatory Visit: Payer: Self-pay | Admitting: Family Medicine

## 2019-06-29 NOTE — Telephone Encounter (Signed)
Requested medication (s) are due for refill today: yes  Requested medication (s) are on the active medication list: yes  Last refill:  05/02/2019  Future visit scheduled: yes  Notes to clinic:  One inhaler should last at least one month. If the patient is requesting refills earlier, contact the patient to check for uncontrolled symptoms   Requested Prescriptions  Pending Prescriptions Disp Refills   albuterol (PROVENTIL) (2.5 MG/3ML) 0.083% nebulizer solution [Pharmacy Med Name: ALBUTEROL 0.083%(2.5MG /3ML) 60X3ML] 180 mL 3    Sig: USE 1 VIAL VIA NEBULIZER EVERY 6 HOURS AS NEEDED FOR WHEEZING OR SHORTNESS OF BREATH      Pulmonology:  Beta Agonists Failed - 06/29/2019 11:46 AM      Failed - One inhaler should last at least one month. If the patient is requesting refills earlier, contact the patient to check for uncontrolled symptoms.      Passed - Valid encounter within last 12 months    Recent Outpatient Visits           9 months ago Chronic obstructive pulmonary disease, unspecified COPD type [facility])   [facility] Jerrol Banana., MD   1 year ago Need for influenza vaccination   [facility] Jerrol Banana., MD   1 year ago Chronic systolic heart failure [facility])   [facility] Jerrol Banana., MD   1 year ago Pneumonia of left lung due to infectious organism, unspecified part of lung   [facility] Jerrol Banana., MD   1 year ago Cardiomyopathy, ischemic   [facility] Jerrol Banana., MD       Future Appointments             In 2 months  [facility] [place]   In 2 months Jerrol Banana., MD [facility], [place]

## 2019-07-05 ENCOUNTER — Other Ambulatory Visit: Payer: Self-pay | Admitting: Family Medicine

## 2019-07-05 MED ORDER — ALBUTEROL SULFATE (2.5 MG/3ML) 0.083% IN NEBU: 2.5000 mg | INHALATION_SOLUTION | Freq: Four times a day (QID) | RESPIRATORY_TRACT | 3 refills | Status: AC | PRN

## 2019-07-05 NOTE — Telephone Encounter (Signed)
[facility] faxed refill request for the following medications:  albuterol (PROVENTIL) (2.5 MG/3ML) 0.083% nebulizer solution  QTY: 180 ml Rx was sent to pharmacy on 06/29/2019 with QTY of 3 ml and they are requesting new Rx for Qty of 180 ml. Please advise. Thanks TNP

## 2019-08-02 ENCOUNTER — Other Ambulatory Visit: Payer: Self-pay | Admitting: Family Medicine

## 2019-08-07 ENCOUNTER — Inpatient Hospital Stay: Payer: Medicare Other | Admitting: Anesthesiology

## 2019-08-07 ENCOUNTER — Inpatient Hospital Stay
Admission: EM | Admit: 2019-08-07 | Discharge: 2019-08-14 | DRG: 853 | Disposition: A | Discharge status: Home / Self Care | Payer: Medicare Other | Attending: Surgery | Admitting: Surgery

## 2019-08-07 ENCOUNTER — Inpatient Hospital Stay: Payer: Medicare Other

## 2019-08-07 ENCOUNTER — Other Ambulatory Visit: Payer: Self-pay

## 2019-08-07 ENCOUNTER — Encounter: Payer: Self-pay | Admitting: Emergency Medicine

## 2019-08-07 ENCOUNTER — Emergency Department: Payer: Medicare Other

## 2019-08-07 ENCOUNTER — Encounter
Admission: EM | Disposition: A | Discharge status: Home / Self Care | Payer: Self-pay | Source: Home / Self Care | Attending: Surgery

## 2019-08-07 DIAGNOSIS — Z4659 Encounter for fitting and adjustment of other gastrointestinal appliance and device: Secondary | ICD-10-CM

## 2019-08-07 DIAGNOSIS — I480 Paroxysmal atrial fibrillation: Secondary | ICD-10-CM | POA: Diagnosis not present

## 2019-08-07 DIAGNOSIS — R55 Syncope and collapse: Secondary | ICD-10-CM | POA: Diagnosis not present

## 2019-08-07 DIAGNOSIS — S199XXA Unspecified injury of neck, initial encounter: Secondary | ICD-10-CM | POA: Diagnosis not present

## 2019-08-07 DIAGNOSIS — K55041 Focal (segmental) acute infarction of large intestine: Secondary | ICD-10-CM | POA: Diagnosis present

## 2019-08-07 DIAGNOSIS — E1165 Type 2 diabetes mellitus with hyperglycemia: Secondary | ICD-10-CM | POA: Diagnosis not present

## 2019-08-07 DIAGNOSIS — J9600 Acute respiratory failure, unspecified whether with hypoxia or hypercapnia: Secondary | ICD-10-CM | POA: Diagnosis not present

## 2019-08-07 DIAGNOSIS — Z87891 Personal history of nicotine dependence: Secondary | ICD-10-CM | POA: Diagnosis not present

## 2019-08-07 DIAGNOSIS — N400 Enlarged prostate without lower urinary tract symptoms: Secondary | ICD-10-CM | POA: Diagnosis not present

## 2019-08-07 DIAGNOSIS — Z7401 Bed confinement status: Secondary | ICD-10-CM | POA: Diagnosis not present

## 2019-08-07 DIAGNOSIS — Z515 Encounter for palliative care: Secondary | ICD-10-CM | POA: Diagnosis not present

## 2019-08-07 DIAGNOSIS — I4821 Permanent atrial fibrillation: Secondary | ICD-10-CM | POA: Diagnosis not present

## 2019-08-07 DIAGNOSIS — I5022 Chronic systolic (congestive) heart failure: Secondary | ICD-10-CM | POA: Diagnosis not present

## 2019-08-07 DIAGNOSIS — R739 Hyperglycemia, unspecified: Secondary | ICD-10-CM | POA: Diagnosis present

## 2019-08-07 DIAGNOSIS — C189 Malignant neoplasm of colon, unspecified: Secondary | ICD-10-CM | POA: Diagnosis not present

## 2019-08-07 DIAGNOSIS — J9622 Acute and chronic respiratory failure with hypercapnia: Secondary | ICD-10-CM | POA: Diagnosis present

## 2019-08-07 DIAGNOSIS — I5023 Acute on chronic systolic (congestive) heart failure: Secondary | ICD-10-CM | POA: Diagnosis present

## 2019-08-07 DIAGNOSIS — N170 Acute kidney failure with tubular necrosis: Secondary | ICD-10-CM | POA: Diagnosis present

## 2019-08-07 DIAGNOSIS — R52 Pain, unspecified: Secondary | ICD-10-CM | POA: Diagnosis not present

## 2019-08-07 DIAGNOSIS — C187 Malignant neoplasm of sigmoid colon: Secondary | ICD-10-CM | POA: Diagnosis present

## 2019-08-07 DIAGNOSIS — Z9981 Dependence on supplemental oxygen: Secondary | ICD-10-CM

## 2019-08-07 DIAGNOSIS — J1282 Pneumonia due to coronavirus disease 2019: Secondary | ICD-10-CM | POA: Diagnosis present

## 2019-08-07 DIAGNOSIS — I2510 Atherosclerotic heart disease of native coronary artery without angina pectoris: Secondary | ICD-10-CM | POA: Diagnosis present

## 2019-08-07 DIAGNOSIS — R0602 Shortness of breath: Secondary | ICD-10-CM | POA: Diagnosis not present

## 2019-08-07 DIAGNOSIS — Z950 Presence of cardiac pacemaker: Secondary | ICD-10-CM

## 2019-08-07 DIAGNOSIS — Z01818 Encounter for other preprocedural examination: Secondary | ICD-10-CM

## 2019-08-07 DIAGNOSIS — R6521 Severe sepsis with septic shock: Secondary | ICD-10-CM | POA: Diagnosis present

## 2019-08-07 DIAGNOSIS — R1084 Generalized abdominal pain: Secondary | ICD-10-CM | POA: Diagnosis not present

## 2019-08-07 DIAGNOSIS — I714 Abdominal aortic aneurysm, without rupture: Secondary | ICD-10-CM | POA: Diagnosis not present

## 2019-08-07 DIAGNOSIS — N1831 Chronic kidney disease, stage 3a: Secondary | ICD-10-CM | POA: Diagnosis not present

## 2019-08-07 DIAGNOSIS — I4891 Unspecified atrial fibrillation: Secondary | ICD-10-CM | POA: Diagnosis present

## 2019-08-07 DIAGNOSIS — E872 Acidosis: Secondary | ICD-10-CM | POA: Diagnosis present

## 2019-08-07 DIAGNOSIS — I4892 Unspecified atrial flutter: Secondary | ICD-10-CM | POA: Diagnosis not present

## 2019-08-07 DIAGNOSIS — Z8249 Family history of ischemic heart disease and other diseases of the circulatory system: Secondary | ICD-10-CM

## 2019-08-07 DIAGNOSIS — I472 Ventricular tachycardia: Secondary | ICD-10-CM | POA: Diagnosis not present

## 2019-08-07 DIAGNOSIS — E7800 Pure hypercholesterolemia, unspecified: Secondary | ICD-10-CM | POA: Diagnosis present

## 2019-08-07 DIAGNOSIS — U071 COVID-19: Secondary | ICD-10-CM | POA: Diagnosis not present

## 2019-08-07 DIAGNOSIS — J449 Chronic obstructive pulmonary disease, unspecified: Secondary | ICD-10-CM | POA: Diagnosis not present

## 2019-08-07 DIAGNOSIS — Z66 Do not resuscitate: Secondary | ICD-10-CM | POA: Diagnosis present

## 2019-08-07 DIAGNOSIS — Z9862 Peripheral vascular angioplasty status: Secondary | ICD-10-CM

## 2019-08-07 DIAGNOSIS — Z7982 Long term (current) use of aspirin: Secondary | ICD-10-CM

## 2019-08-07 DIAGNOSIS — Z8679 Personal history of other diseases of the circulatory system: Secondary | ICD-10-CM

## 2019-08-07 DIAGNOSIS — A419 Sepsis, unspecified organism: Secondary | ICD-10-CM | POA: Diagnosis not present

## 2019-08-07 DIAGNOSIS — K631 Perforation of intestine (nontraumatic): Secondary | ICD-10-CM | POA: Diagnosis not present

## 2019-08-07 DIAGNOSIS — Z452 Encounter for adjustment and management of vascular access device: Secondary | ICD-10-CM

## 2019-08-07 DIAGNOSIS — I255 Ischemic cardiomyopathy: Secondary | ICD-10-CM | POA: Diagnosis present

## 2019-08-07 DIAGNOSIS — I252 Old myocardial infarction: Secondary | ICD-10-CM | POA: Diagnosis not present

## 2019-08-07 DIAGNOSIS — K56699 Other intestinal obstruction unspecified as to partial versus complete obstruction: Secondary | ICD-10-CM | POA: Diagnosis present

## 2019-08-07 DIAGNOSIS — J9621 Acute and chronic respiratory failure with hypoxia: Secondary | ICD-10-CM | POA: Diagnosis present

## 2019-08-07 DIAGNOSIS — J9601 Acute respiratory failure with hypoxia: Secondary | ICD-10-CM | POA: Diagnosis not present

## 2019-08-07 DIAGNOSIS — I130 Hypertensive heart and chronic kidney disease with heart failure and stage 1 through stage 4 chronic kidney disease, or unspecified chronic kidney disease: Secondary | ICD-10-CM | POA: Diagnosis present

## 2019-08-07 DIAGNOSIS — R7989 Other specified abnormal findings of blood chemistry: Secondary | ICD-10-CM | POA: Diagnosis not present

## 2019-08-07 DIAGNOSIS — N183 Chronic kidney disease, stage 3 unspecified: Secondary | ICD-10-CM | POA: Diagnosis not present

## 2019-08-07 DIAGNOSIS — J90 Pleural effusion, not elsewhere classified: Secondary | ICD-10-CM | POA: Diagnosis not present

## 2019-08-07 DIAGNOSIS — J439 Emphysema, unspecified: Secondary | ICD-10-CM | POA: Diagnosis present

## 2019-08-07 DIAGNOSIS — I454 Nonspecific intraventricular block: Secondary | ICD-10-CM | POA: Diagnosis not present

## 2019-08-07 DIAGNOSIS — R918 Other nonspecific abnormal finding of lung field: Secondary | ICD-10-CM | POA: Diagnosis not present

## 2019-08-07 DIAGNOSIS — Z4682 Encounter for fitting and adjustment of non-vascular catheter: Secondary | ICD-10-CM | POA: Diagnosis not present

## 2019-08-07 DIAGNOSIS — I248 Other forms of acute ischemic heart disease: Secondary | ICD-10-CM | POA: Diagnosis present

## 2019-08-07 DIAGNOSIS — N179 Acute kidney failure, unspecified: Secondary | ICD-10-CM | POA: Diagnosis not present

## 2019-08-07 DIAGNOSIS — N1832 Chronic kidney disease, stage 3b: Secondary | ICD-10-CM | POA: Diagnosis present

## 2019-08-07 DIAGNOSIS — Z7952 Long term (current) use of systemic steroids: Secondary | ICD-10-CM

## 2019-08-07 DIAGNOSIS — K668 Other specified disorders of peritoneum: Secondary | ICD-10-CM | POA: Diagnosis present

## 2019-08-07 DIAGNOSIS — F329 Major depressive disorder, single episode, unspecified: Secondary | ICD-10-CM | POA: Diagnosis present

## 2019-08-07 DIAGNOSIS — R6520 Severe sepsis without septic shock: Secondary | ICD-10-CM | POA: Diagnosis not present

## 2019-08-07 DIAGNOSIS — I739 Peripheral vascular disease, unspecified: Secondary | ICD-10-CM | POA: Diagnosis present

## 2019-08-07 DIAGNOSIS — I509 Heart failure, unspecified: Secondary | ICD-10-CM | POA: Diagnosis not present

## 2019-08-07 DIAGNOSIS — M2550 Pain in unspecified joint: Secondary | ICD-10-CM | POA: Diagnosis not present

## 2019-08-07 DIAGNOSIS — Z79899 Other long term (current) drug therapy: Secondary | ICD-10-CM

## 2019-08-07 DIAGNOSIS — K660 Peritoneal adhesions (postprocedural) (postinfection): Secondary | ICD-10-CM | POA: Diagnosis present

## 2019-08-07 DIAGNOSIS — R069 Unspecified abnormalities of breathing: Secondary | ICD-10-CM | POA: Diagnosis not present

## 2019-08-07 DIAGNOSIS — Z7189 Other specified counseling: Secondary | ICD-10-CM

## 2019-08-07 DIAGNOSIS — S0990XA Unspecified injury of head, initial encounter: Secondary | ICD-10-CM | POA: Diagnosis not present

## 2019-08-07 DIAGNOSIS — N184 Chronic kidney disease, stage 4 (severe): Secondary | ICD-10-CM | POA: Diagnosis not present

## 2019-08-07 HISTORY — PX: BOWEL RESECTION: SHX1257

## 2019-08-07 HISTORY — PX: LAPAROTOMY: SHX154

## 2019-08-07 HISTORY — PX: OSTOMY: SHX5997

## 2019-08-07 LAB — COMPREHENSIVE METABOLIC PANEL
ALT: 19 U/L (ref 0–44)
AST: 52 U/L — ABNORMAL HIGH (ref 15–41)
Albumin: 3.5 g/dL (ref 3.5–5.0)
Alkaline Phosphatase: 74 U/L (ref 38–126)
Anion gap: 19 — ABNORMAL HIGH (ref 5–15)
BUN: 59 mg/dL — ABNORMAL HIGH (ref 8–23)
CO2: 20 mmol/L — ABNORMAL LOW (ref 22–32)
Calcium: 8.8 mg/dL — ABNORMAL LOW (ref 8.9–10.3)
Chloride: 98 mmol/L (ref 98–111)
Creatinine, Ser: 2.49 mg/dL — ABNORMAL HIGH (ref 0.61–1.24)
GFR calc Af Amer: 28 mL/min — ABNORMAL LOW (ref >60)
GFR calc non Af Amer: 24 mL/min — ABNORMAL LOW (ref >60)
Glucose, Bld: 212 mg/dL — ABNORMAL HIGH (ref 70–99)
Potassium: 4.8 mmol/L (ref 3.5–5.1)
Sodium: 137 mmol/L (ref 135–145)
Total Bilirubin: 1 mg/dL (ref 0.3–1.2)
Total Protein: 7.1 g/dL (ref 6.5–8.1)

## 2019-08-07 LAB — URINALYSIS, ROUTINE W REFLEX MICROSCOPIC
Bacteria, UA: NONE SEEN
Bilirubin Urine: NEGATIVE
Glucose, UA: NEGATIVE mg/dL
Ketones, ur: NEGATIVE mg/dL
Leukocytes,Ua: NEGATIVE
Nitrite: NEGATIVE
Protein, ur: 100 mg/dL — AB
Specific Gravity, Urine: 1.015 (ref 1.005–1.030)
Squamous Epithelial / HPF: NONE SEEN (ref 0–5)
pH: 5 (ref 5.0–8.0)

## 2019-08-07 LAB — BASIC METABOLIC PANEL
Anion gap: 14 (ref 5–15)
BUN: 60 mg/dL — ABNORMAL HIGH (ref 8–23)
CO2: 21 mmol/L — ABNORMAL LOW (ref 22–32)
Calcium: 8.4 mg/dL — ABNORMAL LOW (ref 8.9–10.3)
Chloride: 104 mmol/L (ref 98–111)
Creatinine, Ser: 2.3 mg/dL — ABNORMAL HIGH (ref 0.61–1.24)
GFR calc Af Amer: 31 mL/min — ABNORMAL LOW (ref >60)
GFR calc non Af Amer: 26 mL/min — ABNORMAL LOW (ref >60)
Glucose, Bld: 123 mg/dL — ABNORMAL HIGH (ref 70–99)
Potassium: 3.8 mmol/L (ref 3.5–5.1)
Sodium: 139 mmol/L (ref 135–145)

## 2019-08-07 LAB — RESPIRATORY PANEL BY RT PCR (FLU A&B, COVID)
Influenza A by PCR: NEGATIVE
Influenza B by PCR: NEGATIVE
SARS Coronavirus 2 by RT PCR: POSITIVE — AB

## 2019-08-07 LAB — CBC
HCT: 39.8 % (ref 39.0–52.0)
Hemoglobin: 12.5 g/dL — ABNORMAL LOW (ref 13.0–17.0)
MCH: 28.1 pg (ref 26.0–34.0)
MCHC: 31.4 g/dL (ref 30.0–36.0)
MCV: 89.4 fL (ref 80.0–100.0)
Platelets: 142 10*3/uL — ABNORMAL LOW (ref 150–400)
RBC: 4.45 MIL/uL (ref 4.22–5.81)
RDW: 14.4 % (ref 11.5–15.5)
WBC: 18.3 10*3/uL — ABNORMAL HIGH (ref 4.0–10.5)
nRBC: 0 % (ref 0.0–0.2)

## 2019-08-07 LAB — BRAIN NATRIURETIC PEPTIDE: B Natriuretic Peptide: 792 pg/mL — ABNORMAL HIGH (ref 0.0–100.0)

## 2019-08-07 LAB — BLOOD GAS, ARTERIAL
Acid-base deficit: 1.5 mmol/L (ref 0.0–2.0)
Bicarbonate: 24.8 mmol/L (ref 20.0–28.0)
FIO2: 0.4
MECHVT: 500 mL
O2 Saturation: 99.2 %
PEEP: 5 cmH2O
Patient temperature: 37
RATE: 18 resp/min
pCO2 arterial: 47 mmHg (ref 32.0–48.0)
pH, Arterial: 7.33 — ABNORMAL LOW (ref 7.350–7.450)
pO2, Arterial: 148 mmHg — ABNORMAL HIGH (ref 83.0–108.0)

## 2019-08-07 LAB — LACTIC ACID, PLASMA
Lactic Acid, Venous: 6.4 mmol/L (ref 0.5–1.9)
Lactic Acid, Venous: 1.8 mmol/L (ref 0.5–1.9)

## 2019-08-07 LAB — CBC WITH DIFFERENTIAL/PLATELET
Abs Immature Granulocytes: 0.59 10*3/uL — ABNORMAL HIGH (ref 0.00–0.07)
Basophils Absolute: 0.1 10*3/uL (ref 0.0–0.1)
Basophils Relative: 0 %
Eosinophils Absolute: 0 10*3/uL (ref 0.0–0.5)
Eosinophils Relative: 0 %
HCT: 42.8 % (ref 39.0–52.0)
Hemoglobin: 13.4 g/dL (ref 13.0–17.0)
Immature Granulocytes: 2 %
Lymphocytes Relative: 16 %
Lymphs Abs: 4.8 10*3/uL — ABNORMAL HIGH (ref 0.7–4.0)
MCH: 28.3 pg (ref 26.0–34.0)
MCHC: 31.3 g/dL (ref 30.0–36.0)
MCV: 90.5 fL (ref 80.0–100.0)
Monocytes Absolute: 1.7 10*3/uL — ABNORMAL HIGH (ref 0.1–1.0)
Monocytes Relative: 6 %
Neutro Abs: 23.4 10*3/uL — ABNORMAL HIGH (ref 1.7–7.7)
Neutrophils Relative %: 76 %
Platelets: 329 10*3/uL (ref 150–400)
RBC: 4.73 MIL/uL (ref 4.22–5.81)
RDW: 14 % (ref 11.5–15.5)
Smear Review: NORMAL
WBC: 30.5 10*3/uL — ABNORMAL HIGH (ref 4.0–10.5)
nRBC: 0 % (ref 0.0–0.2)

## 2019-08-07 LAB — APTT: aPTT: 29 seconds (ref 24–36)

## 2019-08-07 LAB — PROCALCITONIN: Procalcitonin: 0.77 ng/mL

## 2019-08-07 LAB — TROPONIN I (HIGH SENSITIVITY)
Troponin I (High Sensitivity): 56 ng/L — ABNORMAL HIGH (ref <18)
Troponin I (High Sensitivity): 64 ng/L — ABNORMAL HIGH (ref <18)

## 2019-08-07 LAB — LIPASE, BLOOD: Lipase: 18 U/L (ref 11–51)

## 2019-08-07 LAB — GLUCOSE, CAPILLARY: Glucose-Capillary: 127 mg/dL — ABNORMAL HIGH (ref 70–99)

## 2019-08-07 LAB — FERRITIN: Ferritin: 339 ng/mL — ABNORMAL HIGH (ref 24–336)

## 2019-08-07 LAB — PROTIME-INR
INR: 1.4 — ABNORMAL HIGH (ref 0.8–1.2)
Prothrombin Time: 16.9 seconds — ABNORMAL HIGH (ref 11.4–15.2)

## 2019-08-07 SURGERY — LAPAROTOMY, EXPLORATORY
Anesthesia: General

## 2019-08-07 MED ORDER — HEPARIN SODIUM (PORCINE) 5000 UNIT/ML IJ SOLN
5000.0000 [IU] | Freq: Three times a day (TID) | INTRAMUSCULAR | Status: DC
  Administered 2019-08-08 – 2019-08-14 (×20): 5000 [IU] via SUBCUTANEOUS
  Filled 2019-08-07 (×19): qty 1

## 2019-08-07 MED ORDER — DEXAMETHASONE SODIUM PHOSPHATE 10 MG/ML IJ SOLN
6.0000 mg | INTRAMUSCULAR | Status: DC
  Administered 2019-08-08 – 2019-08-13 (×7): 6 mg via INTRAVENOUS
  Filled 2019-08-07 (×2): qty 0.6
  Filled 2019-08-07 (×3): qty 1
  Filled 2019-08-07 (×4): qty 0.6

## 2019-08-07 MED ORDER — LACTATED RINGERS IV BOLUS (SEPSIS)
1000.0000 mL | Freq: Once | INTRAVENOUS | Status: AC
  Administered 2019-08-07: 15:00:00 1000 mL via INTRAVENOUS

## 2019-08-07 MED ORDER — KETOROLAC TROMETHAMINE 15 MG/ML IJ SOLN
15.0000 mg | Freq: Four times a day (QID) | INTRAMUSCULAR | Status: AC | PRN
  Filled 2019-08-07: qty 1

## 2019-08-07 MED ORDER — SODIUM CHLORIDE 0.9 % IV SOLN
2.0000 g | Freq: Once | INTRAVENOUS | Status: AC
  Administered 2019-08-07: 14:00:00 2 g via INTRAVENOUS
  Filled 2019-08-07: qty 2

## 2019-08-07 MED ORDER — LACTATED RINGERS IV BOLUS (SEPSIS)
1000.0000 mL | Freq: Once | INTRAVENOUS | Status: AC
  Administered 2019-08-07: 14:00:00 1000 mL via INTRAVENOUS

## 2019-08-07 MED ORDER — PROPOFOL 10 MG/ML IV BOLUS
INTRAVENOUS | Status: AC
  Filled 2019-08-07: qty 20

## 2019-08-07 MED ORDER — PANTOPRAZOLE SODIUM 40 MG IV SOLR
40.0000 mg | Freq: Every day | INTRAVENOUS | Status: DC
  Administered 2019-08-07 – 2019-08-13 (×7): 40 mg via INTRAVENOUS
  Filled 2019-08-07 (×7): qty 40

## 2019-08-07 MED ORDER — PROPOFOL 10 MG/ML IV BOLUS
INTRAVENOUS | Status: DC | PRN
  Administered 2019-08-07: 100 mg via INTRAVENOUS

## 2019-08-07 MED ORDER — SODIUM CHLORIDE 0.9 % IV SOLN
INTRAVENOUS | Status: DC | PRN
  Administered 2019-08-07: 50 ug/min via INTRAVENOUS

## 2019-08-07 MED ORDER — ROCURONIUM BROMIDE 100 MG/10ML IV SOLN
INTRAVENOUS | Status: DC | PRN
  Administered 2019-08-07: 40 mg via INTRAVENOUS
  Administered 2019-08-07: 50 mg via INTRAVENOUS
  Administered 2019-08-07: 10 mg via INTRAVENOUS

## 2019-08-07 MED ORDER — SODIUM CHLORIDE 0.9 % IV SOLN: INTRAVENOUS | Status: DC

## 2019-08-07 MED ORDER — METRONIDAZOLE IN NACL 5-0.79 MG/ML-% IV SOLN
500.0000 mg | Freq: Once | INTRAVENOUS | Status: AC
  Administered 2019-08-07: 15:00:00 500 mg via INTRAVENOUS
  Filled 2019-08-07: qty 100

## 2019-08-07 MED ORDER — LIDOCAINE HCL (CARDIAC) PF 100 MG/5ML IV SOSY
PREFILLED_SYRINGE | INTRAVENOUS | Status: DC | PRN
  Administered 2019-08-07: 100 mg via INTRAVENOUS

## 2019-08-07 MED ORDER — EPHEDRINE SULFATE 50 MG/ML IJ SOLN
INTRAMUSCULAR | Status: DC | PRN
  Administered 2019-08-07: 15 mg via INTRAVENOUS
  Administered 2019-08-07: 10 mg via INTRAVENOUS

## 2019-08-07 MED ORDER — INSULIN ASPART 100 UNIT/ML ~~LOC~~ SOLN
0.0000 [IU] | SUBCUTANEOUS | Status: DC
  Administered 2019-08-08 – 2019-08-11 (×7): 2 [IU] via SUBCUTANEOUS
  Administered 2019-08-11: 3 [IU] via SUBCUTANEOUS
  Administered 2019-08-12: 12:00:00 100 [IU] via SUBCUTANEOUS
  Administered 2019-08-12 – 2019-08-14 (×6): 2 [IU] via SUBCUTANEOUS
  Administered 2019-08-14: 12:00:00 3 [IU] via SUBCUTANEOUS
  Administered 2019-08-14: 05:00:00 2 [IU] via SUBCUTANEOUS
  Administered 2019-08-14: 17:00:00 3 [IU] via SUBCUTANEOUS
  Filled 2019-08-07 (×21): qty 1

## 2019-08-07 MED ORDER — ALBUTEROL SULFATE (2.5 MG/3ML) 0.083% IN NEBU: 2.5000 mg | INHALATION_SOLUTION | Freq: Four times a day (QID) | RESPIRATORY_TRACT | Status: DC | PRN

## 2019-08-07 MED ORDER — PIPERACILLIN-TAZOBACTAM 3.375 G IVPB
3.3750 g | Freq: Three times a day (TID) | INTRAVENOUS | Status: DC
  Administered 2019-08-08 – 2019-08-14 (×20): 3.375 g via INTRAVENOUS
  Filled 2019-08-07 (×26): qty 50

## 2019-08-07 MED ORDER — MIDAZOLAM HCL 2 MG/2ML IJ SOLN
1.0000 mg | INTRAMUSCULAR | Status: DC | PRN
  Filled 2019-08-07: qty 2

## 2019-08-07 MED ORDER — SODIUM CHLORIDE FLUSH 0.9 % IV SOLN
INTRAVENOUS | Status: AC
  Filled 2019-08-07: qty 10

## 2019-08-07 MED ORDER — MIDAZOLAM HCL 2 MG/2ML IJ SOLN: 1.0000 mg | INTRAMUSCULAR | Status: DC | PRN

## 2019-08-07 MED ORDER — FENTANYL 2500MCG IN NS 250ML (10MCG/ML) PREMIX INFUSION
0.0000 ug/h | INTRAVENOUS | Status: DC
  Administered 2019-08-07 (×2): 125 ug/h via INTRAVENOUS
  Filled 2019-08-07 (×2): qty 250

## 2019-08-07 MED ORDER — BUPIVACAINE LIPOSOME 1.3 % IJ SUSP
INTRAMUSCULAR | Status: AC
  Filled 2019-08-07: qty 20

## 2019-08-07 MED ORDER — SODIUM CHLORIDE 0.9 % IV SOLN
200.0000 mg | Freq: Once | INTRAVENOUS | Status: AC
  Administered 2019-08-08: 02:00:00 200 mg via INTRAVENOUS
  Filled 2019-08-07: qty 200

## 2019-08-07 MED ORDER — PHENYLEPHRINE HCL (PRESSORS) 10 MG/ML IV SOLN
INTRAVENOUS | Status: AC
  Filled 2019-08-07: qty 2

## 2019-08-07 MED ORDER — BUPIVACAINE-EPINEPHRINE (PF) 0.5% -1:200000 IJ SOLN
INTRAMUSCULAR | Status: AC
  Filled 2019-08-07: qty 30

## 2019-08-07 MED ORDER — PHENYLEPHRINE HCL (PRESSORS) 10 MG/ML IV SOLN
INTRAVENOUS | Status: DC | PRN
  Administered 2019-08-07: 100 ug via INTRAVENOUS
  Administered 2019-08-07: 200 ug via INTRAVENOUS
  Administered 2019-08-07: 400 ug via INTRAVENOUS
  Administered 2019-08-07 (×2): 200 ug via INTRAVENOUS
  Administered 2019-08-07: 400 ug via INTRAVENOUS

## 2019-08-07 MED ORDER — METOPROLOL TARTRATE 5 MG/5ML IV SOLN: 5.0000 mg | Freq: Four times a day (QID) | INTRAVENOUS | Status: DC

## 2019-08-07 MED ORDER — ROCURONIUM BROMIDE 50 MG/5ML IV SOLN
INTRAVENOUS | Status: AC
  Filled 2019-08-07: qty 1

## 2019-08-07 MED ORDER — FENTANYL CITRATE (PF) 100 MCG/2ML IJ SOLN
INTRAMUSCULAR | Status: AC
  Filled 2019-08-07: qty 2

## 2019-08-07 MED ORDER — ONDANSETRON 4 MG PO TBDP
4.0000 mg | ORAL_TABLET | Freq: Four times a day (QID) | ORAL | Status: DC | PRN
  Filled 2019-08-07: qty 1

## 2019-08-07 MED ORDER — LACTATED RINGERS IV BOLUS (SEPSIS): 500.0000 mL | Freq: Once | INTRAVENOUS | Status: DC

## 2019-08-07 MED ORDER — NOREPINEPHRINE 4 MG/250ML-% IV SOLN
0.0000 ug/min | INTRAVENOUS | Status: DC
  Administered 2019-08-07: 5 ug/min via INTRAVENOUS
  Filled 2019-08-07: qty 250

## 2019-08-07 MED ORDER — VASOPRESSIN 20 UNIT/ML IV SOLN
INTRAVENOUS | Status: AC
  Filled 2019-08-07: qty 1

## 2019-08-07 MED ORDER — VANCOMYCIN HCL IN DEXTROSE 1-5 GM/200ML-% IV SOLN
1000.0000 mg | Freq: Once | INTRAVENOUS | Status: AC
  Administered 2019-08-07: 15:00:00 1000 mg via INTRAVENOUS
  Filled 2019-08-07: qty 200

## 2019-08-07 MED ORDER — EPHEDRINE SULFATE 50 MG/ML IJ SOLN
INTRAMUSCULAR | Status: AC
  Filled 2019-08-07: qty 1

## 2019-08-07 MED ORDER — HYDROMORPHONE HCL 1 MG/ML IJ SOLN: 0.5000 mg | INTRAMUSCULAR | Status: DC | PRN

## 2019-08-07 MED ORDER — SODIUM CHLORIDE 0.9 % IV SOLN
100.0000 mg | Freq: Every day | INTRAVENOUS | Status: DC
  Filled 2019-08-07: qty 20

## 2019-08-07 MED ORDER — ONDANSETRON HCL 4 MG/2ML IJ SOLN: 4.0000 mg | Freq: Four times a day (QID) | INTRAMUSCULAR | Status: DC | PRN

## 2019-08-07 MED ORDER — SUCCINYLCHOLINE CHLORIDE 20 MG/ML IJ SOLN
INTRAMUSCULAR | Status: AC
  Filled 2019-08-07: qty 1

## 2019-08-07 MED ORDER — FENTANYL CITRATE (PF) 100 MCG/2ML IJ SOLN
25.0000 ug | Freq: Once | INTRAMUSCULAR | Status: DC
  Filled 2019-08-07: qty 2

## 2019-08-07 MED ORDER — ACETAMINOPHEN 10 MG/ML IV SOLN: INTRAVENOUS | Status: DC | PRN

## 2019-08-07 MED ORDER — LACTATED RINGERS IV SOLN: INTRAVENOUS | Status: DC | PRN

## 2019-08-07 MED ORDER — LIDOCAINE HCL (PF) 2 % IJ SOLN
INTRAMUSCULAR | Status: AC
  Filled 2019-08-07: qty 10

## 2019-08-07 MED ORDER — LACTATED RINGERS IV SOLN: 125.0000 mL/h | INTRAVENOUS | Status: DC

## 2019-08-07 MED ORDER — SUCCINYLCHOLINE CHLORIDE 20 MG/ML IJ SOLN
INTRAMUSCULAR | Status: DC | PRN
  Administered 2019-08-07: 100 mg via INTRAVENOUS

## 2019-08-07 MED ORDER — FENTANYL CITRATE (PF) 100 MCG/2ML IJ SOLN
INTRAMUSCULAR | Status: DC | PRN
  Administered 2019-08-07 (×2): 25 ug via INTRAVENOUS

## 2019-08-07 MED ORDER — VASOPRESSIN 20 UNIT/ML IV SOLN
INTRAVENOUS | Status: DC | PRN
  Administered 2019-08-07 (×2): 1 [IU] via INTRAVENOUS

## 2019-08-07 MED ORDER — FENTANYL BOLUS VIA INFUSION
25.0000 ug | INTRAVENOUS | Status: DC | PRN
  Filled 2019-08-07: qty 25

## 2019-08-07 MED ORDER — PROPOFOL 500 MG/50ML IV EMUL
INTRAVENOUS | Status: AC
  Filled 2019-08-07: qty 50

## 2019-08-07 SURGICAL SUPPLY — 43 items
APPLICATOR CHLORAPREP 10.5 ORG (MISCELLANEOUS) ×3 IMPLANT
BULB RESERV EVAC DRAIN JP 100C (MISCELLANEOUS) ×6 IMPLANT
CANISTER SUCT 1200ML W/VALVE (MISCELLANEOUS) ×3 IMPLANT
COVER WAND RF STERILE (DRAPES) ×3 IMPLANT
DRAIN CHANNEL JP 19F (MISCELLANEOUS) ×6 IMPLANT
DRAPE LAPAROTOMY 100X77 ABD (DRAPES) ×3 IMPLANT
DRSG OPSITE POSTOP 4X12 (GAUZE/BANDAGES/DRESSINGS) IMPLANT
DRSG TEGADERM 4X10 (GAUZE/BANDAGES/DRESSINGS) IMPLANT
ELECT BLADE 6.5 EXT (BLADE) ×3 IMPLANT
ELECT CAUTERY BLADE 6.4 (BLADE) ×3 IMPLANT
ELECT REM PT RETURN 9FT ADLT (ELECTROSURGICAL) ×3
ELECTRODE REM PT RTRN 9FT ADLT (ELECTROSURGICAL) ×1 IMPLANT
GAUZE SPONGE 4X4 12PLY STRL (GAUZE/BANDAGES/DRESSINGS) IMPLANT
GLOVE SURG SYN 7.0 (GLOVE) ×6 IMPLANT
GLOVE SURG SYN 7.5  E (GLOVE) ×4
GLOVE SURG SYN 7.5 E (GLOVE) ×2 IMPLANT
GOWN STRL REUS W/ TWL LRG LVL3 (GOWN DISPOSABLE) ×2 IMPLANT
GOWN STRL REUS W/TWL LRG LVL3 (GOWN DISPOSABLE) ×4
KIT OSTOMY 2 PC DRNBL 2.25 STR (WOUND CARE) ×1 IMPLANT
KIT OSTOMY DRAINABLE 2.25 STR (WOUND CARE) ×2
LABEL OR SOLS (LABEL) ×3 IMPLANT
LIGASURE IMPACT 36 18CM CVD LR (INSTRUMENTS) ×3 IMPLANT
NEEDLE HYPO 22GX1.5 SAFETY (NEEDLE) ×6 IMPLANT
NS IRRIG 1000ML POUR BTL (IV SOLUTION) ×3 IMPLANT
PACK BASIN MAJOR ARMC (MISCELLANEOUS) ×3 IMPLANT
PACK COLON CLEAN CLOSURE (MISCELLANEOUS) IMPLANT
RELOAD PROXIMATE 75MM BLUE (ENDOMECHANICALS) ×3 IMPLANT
SEPRAFILM MEMBRANE 5X6 (MISCELLANEOUS) IMPLANT
STAPLER CUT CVD 40MM BLUE (STAPLE) ×3 IMPLANT
STAPLER PROXIMATE 75MM BLUE (STAPLE) ×3 IMPLANT
STAPLER SKIN PROX 35W (STAPLE) ×3 IMPLANT
SUT PDS AB 1 CT1 36 (SUTURE) IMPLANT
SUT PDS AB 1 TP1 54 (SUTURE) ×3 IMPLANT
SUT PROLENE 0 CT 1 30 (SUTURE) IMPLANT
SUT SILK 2 0 (SUTURE) ×2
SUT SILK 2-0 18XBRD TIE 12 (SUTURE) ×1 IMPLANT
SUT SILK 3-0 (SUTURE) ×3 IMPLANT
SUT VIC AB 3-0 SH 27 (SUTURE) ×2
SUT VIC AB 3-0 SH 27X BRD (SUTURE) ×1 IMPLANT
SUT VICRYL 3-0 CR8 SH (SUTURE) ×3 IMPLANT
SYR 10ML LL (SYRINGE) ×3 IMPLANT
SYR 30ML LL (SYRINGE) ×6 IMPLANT
TRAY FOLEY MTR SLVR 16FR STAT (SET/KITS/TRAYS/PACK) ×3 IMPLANT

## 2019-08-07 NOTE — Progress Notes (Signed)
Pharmacy Antibiotic Note  Phillip Allison. is a 79 y.o. male admitted on 08/07/2019 with intraabdominal infection. Patient admited through the emergency department with distended abdomen and 2 day history of abdominal pain. Patient Positive for Coronavirus on 2/13; per H&P patient is asymptomatic. Pharmacy has been consulted for Zosyn dosing. Patient received cefepime, metronidazole, and vancomycin in ED on 2/13.   Plan: Zosyn EI  3.375g IV Q8hr.   Will obtain serum creatinine with am labs and adjust as necessary.   Height: 5\' 7"  (170.2 cm) Weight: 175 lb (79.4 kg) IBW/kg (Calculated) : 66.1  Temp (24hrs), Avg:97.9 F (36.6 C), Min:97.9 F (36.6 C), Max:97.9 F (36.6 C)  Recent Labs  Lab 08/07/19 1220  WBC 30.5*  CREATININE 2.49*  LATICACIDVEN 6.4*    Estimated Creatinine Clearance: 25.1 mL/min (A) (by C-G formula based on SCr of 2.49 mg/dL (H)).    No Known Allergies  Antimicrobials this admission: Vancomycin 2/13 x 1 Cefepime 2/13 x 1 Metronidazole 2/13 x 1 Zosyn 2/13 >>   Dose adjustments this admission: N/A  Microbiology results: 2/13 BCx: pending  2/13 SARS Coronavirus 2: positive  2/13 Influenza A/B: negative   Thank you for allowing pharmacy to be a part of this patient's care.  Alphonzo Devera L 08/07/2019 4:31 PM

## 2019-08-07 NOTE — Progress Notes (Signed)
CODE SEPSIS - PHARMACY COMMUNICATION  **Broad Spectrum Antibiotics should be administered within 1 hour of Sepsis diagnosis**  Time Code Sepsis Called/Page Received: @ 1339  Antibiotics Ordered: Cefepime, Flagyl, Vancomycin   Time of 1st antibiotic administration: @ [place], PharmD, BCPS Clinical Pharmacist 08/07/2019 1:45 PM

## 2019-08-07 NOTE — ED Notes (Signed)
Date and time results received: 08/07/19 6.4  Test: Lactic Acid Critical Value: 6.4  Name of Provider Notified: Dr. Jari Pigg, MD

## 2019-08-07 NOTE — ED Notes (Signed)
Pacemaker interrogated by this RN per EDP orders.

## 2019-08-07 NOTE — ED Provider Notes (Signed)
[facility] Emergency Department Provider Note  ____________________________________________   First MD Initiated Contact with Patient 08/07/19 1212     (approximate)  I have reviewed the triage vital signs and the nursing notes.   HISTORY  Chief Complaint Shortness of Breath and Abdominal Pain    HPI Phillip Allison. is a 79 y.o. male with CHF, COPD on 2 L, pacemaker is [employer] who comes in for concern for passing out.  Patient is not using the bathroom and he passed out.  According to family the call out was CPR.  Unclear if family did CPR.  Fire did not do CPR and EMS did not do CPR.  Patient had a pulse upon arrival.  Glucose was elevated.  No history of diabetes.  Patient at this time reports feeling his abdomen has been more distended over the past 2 days, constant, nothing makes it better, nothing makes it worse.  He is working to breathe is slightly elevated  but denies really feeling short of breath            Past Medical History:  Diagnosis Date  . CHF (congestive heart failure) ([place])   . COPD (chronic obstructive pulmonary disease) ([place])   . Depression   . Emphysema lung ([place])   . Hypercholesterolemia   . Hypertension   . Myocardial infarction ([place])   . On home oxygen therapy    2 liters at night and as needed  . Pneumonia    in the past  . Presence of permanent cardiac pacemaker   . Shortness of breath dyspnea    with exertion    Patient Active Problem List   Diagnosis Date Noted  . Hyperkalemia 05/13/2019  . Hyperphosphatemia 05/12/2019  . Hypermagnesemia 05/12/2019  . Hemoptysis 05/08/2019  . HTN (hypertension) 05/08/2019  . Depression 05/08/2019  . Acute on chronic respiratory failure with hypoxia and hypercapnia ([place]) 10/08/2017  . COPD (chronic obstructive pulmonary disease) ([place]) 03/11/2016  . History of tobacco use 03/13/2015  . History of atrial fibrillation 03/13/2015  . Chronic systolic heart failure  ([place]) 03/13/2015  . Intraventricular block 03/13/2015  . 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[place]) 03/10/2015  . Cardiovascular disease 03/10/2015  . Arteriosclerosis of coronary artery 03/10/2015  . CAFL (chronic airflow limitation) ([place]) 03/10/2015  . Chronic kidney disease (CKD), stage III (moderate) 03/10/2015  . Depression, major, single episode, mild ([place]) 03/10/2015  . 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 Acid reflux 03/10/2015  . Heart & renal disease, hypertensive, with heart failure ([place]) 03/10/2015  . Cardiomyopathy, ischemic 03/10/2015  . 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[place]) 03/10/2015  . Hypercholesterolemia without hypertriglyceridemia 03/10/2015  . Cardiac defibrillator in place 01/25/2015  . MI (mitral incompetence) 11/01/2014  . Benign essential HTN 10/17/2014    Past Surgical History:  Procedure Laterality Date  . ABDOMINAL AORTIC ANEURYSM REPAIR    . ANGIOPLASTY     left lower extremity  . CATARACT EXTRACTION W/PHACO Right 12/18/2015   Procedure: CATARACT EXTRACTION PHACO AND INTRAOCULAR LENS PLACEMENT (IOC);  Surgeon: Estill Cotta, MD;  Location: [facility] ORS;  Service: Ophthalmology;  Laterality: Right;  [place] 01:58 AP% 23.5 CDE 50.91 fluid pack lot # [id number]  . IMPLANTABLE CARDIOVERTER DEFIBRILLATOR (ICD) GENERATOR CHANGE N/A 05/12/2019   Procedure: ICD GENERATOR CHANGE;  Surgeon: Isaias Cowman, MD;  Location: [facility] ORS;  Service: Cardiovascular;  Laterality: N/A;  . INSERT / REPLACE / REMOVE PACEMAKER    . PACEMAKER PLACEMENT    . TONSILLECTOMY  Prior to Admission medications   Medication Sig Start Date End Date Taking? Authorizing Provider  albuterol (PROVENTIL) (2.5 MG/3ML) 0.083% nebulizer solution Take 3 mLs (2.5 mg total) by nebulization every 6 (six) hours as needed for wheezing or shortness of breath. 07/05/19   Jerrol Banana., MD  amLODipine (NORVASC) 5 MG tablet Take 1 tablet (5 mg total) by mouth daily. 10/10/17   Fritzi Mandes, MD    aspirin 81 MG tablet Take 81 mg by mouth at bedtime.  08/22/11   [provider]  carvedilol (COREG) 6.25 MG tablet Take 1 tablet (6.25 mg total) by mouth 2 (two) times daily with a meal. 05/12/19   Ivor Costa, MD  ENTRESTO 24-26 MG Take 1 tablet by mouth 2 (two) times daily. 05/05/19   [provider]  guaiFENesin (MUCINEX) 600 MG 12 hr tablet Take 1 tablet (600 mg total) by mouth 2 (two) times daily. 05/12/19   Ivor Costa, MD  levofloxacin (LEVAQUIN) 250 MG tablet Take 1 tablet (250 mg total) by mouth daily. 05/13/19   Ivor Costa, MD  pravastatin (PRAVACHOL) 40 MG tablet Take 1 tablet (40 mg total) by mouth at bedtime. 10/12/18   Jerrol Banana., MD  predniSONE (DELTASONE) 10 MG tablet Take 1 tablet (10 mg total) by mouth daily. 05/12/19   Ivor Costa, MD  sertraline (ZOLOFT) 50 MG tablet TAKE 1 TABLET(50 MG) BY MOUTH DAILY 08/03/19   Jerrol Banana., MD    Allergies Patient has no known allergies.  Family History  Problem Relation Age of Onset  . Psoriasis Mother   . Heart attack Father     Social History Social History   Tobacco Use  . Smoking status: Former Smoker    Packs/day: 0.75    Years: 54.00    Pack years: 40.50    Types: Cigarettes    Quit date: 2014    Years since quitting: 7.1  . Smokeless tobacco: Never Used  . Tobacco comment: 2014?  Substance Use Topics  . Alcohol use: No  . Drug use: No      Review of Systems Constitutional: No fever/chills, positive syncope Eyes: No visual changes. ENT: No sore throat. Cardiovascular: Denies chest pain. Respiratory: Denies shortness of breath. Gastrointestinal: Positive abdominal distention. no diarrhea.  No constipation. Genitourinary: Negative for dysuria. Musculoskeletal: Negative for back pain. Skin: Negative for rash. Neurological: Negative for headaches, focal weakness or numbness. All other ROS negative ____________________________________________   PHYSICAL EXAM:  VITAL  SIGNS: Blood pressure (!) 109/56, pulse 82, temperature 97.9 F (36.6 C), temperature source Oral, resp. rate (!) 21, height 5\' 7"  (1.702 m), weight 79.4 kg, SpO2 98 %.  Constitutional: Alert and oriented x3 Eyes: Conjunctivae are normal. EOMI. Head: Atraumatic. Nose: No congestion/rhinnorhea. Mouth/Throat: Mucous membranes are moist.   Neck: No stridor. Trachea Midline. FROM Cardiovascular: Normal rate, regular rhythm. Grossly normal heart sounds.  Good peripheral circulation. Respiratory: Normal respiratory effort.  No retractions. Lungs CTAB.  Mild increased work of breathing Gastrointestinal: Distended abdomen does not seem to be particularly tender though no abdominal bruits.  Musculoskeletal: No lower extremity tenderness nor edema.  No joint effusions. Neurologic:  Normal speech and language. No gross focal neurologic deficits are appreciated.  Skin:  Skin is warm, dry and intact. No rash noted. Psychiatric: Mood and affect are normal. Speech and behavior are normal. GU: Deferred   ____________________________________________   LABS (all labs ordered are listed, but only abnormal results are displayed)  Labs Reviewed  RESPIRATORY PANEL BY RT PCR (FLU A&B, COVID) - Abnormal; Notable for the following components:      Result Value   SARS Coronavirus 2 by RT PCR POSITIVE (*)    All other components within normal limits  CBC WITH DIFFERENTIAL/PLATELET - Abnormal; Notable for the following components:   WBC 30.5 (*)    Neutro Abs 23.4 (*)    Lymphs Abs 4.8 (*)    Monocytes Absolute 1.7 (*)    Abs Immature Granulocytes 0.59 (*)    All other components within normal limits  COMPREHENSIVE METABOLIC PANEL - Abnormal; Notable for the following components:   CO2 20 (*)    Glucose, Bld 212 (*)    BUN 59 (*)    Creatinine, Ser 2.49 (*)    Calcium 8.8 (*)    AST 52 (*)    GFR calc non Af Amer 24 (*)    GFR calc Af Amer 28 (*)    Anion gap 19 (*)    All other components within  normal limits  BRAIN NATRIURETIC PEPTIDE - Abnormal; Notable for the following components:   B Natriuretic Peptide 792.0 (*)    All other components within normal limits  LACTIC ACID, PLASMA - Abnormal; Notable for the following components:   Lactic Acid, Venous 6.4 (*)    All other components within normal limits  PROTIME-INR - Abnormal; Notable for the following components:   Prothrombin Time 16.9 (*)    INR 1.4 (*)    All other components within normal limits  TROPONIN I (HIGH SENSITIVITY) - Abnormal; Notable for the following components:   Troponin I (High Sensitivity) 56 (*)    All other components within normal limits  CULTURE, BLOOD (ROUTINE X 2)  CULTURE, BLOOD (ROUTINE X 2)  LIPASE, BLOOD  APTT  PROCALCITONIN  LACTIC ACID, PLASMA  URINALYSIS, ROUTINE W REFLEX MICROSCOPIC  TROPONIN I (HIGH SENSITIVITY)   ____________________________________________   ED ECG REPORT I, Vanessa Noble, the attending physician, personally viewed and interpreted this ECG.  EKG is sinus tachycardia rate of 103, no ST elevations, no T wave inversions, widened QRS looks consistent with a left bundle branch block, flipped T wave in lead I to in aVL ____________________________________________  RADIOLOGY   Official radiology report(s): CT ABDOMEN PELVIS WO CONTRAST  Result Date: 08/07/2019 CLINICAL DATA:  Abdominal pain, shortness of breath, constipation. Syncope in the bathroom today. Tachypneic. EXAM: CT CHEST, ABDOMEN AND PELVIS WITHOUT CONTRAST TECHNIQUE: Multidetector CT imaging of the chest, abdomen and pelvis was performed following the standard protocol without IV contrast. COMPARISON:  Multiple exams, including CT chest 05/08/2019 and CT abdomen 11/26/2011 FINDINGS: CT CHEST FINDINGS Cardiovascular: AICD noted. Coronary, aortic arch, and branch vessel atherosclerotic vascular disease. Mild cardiomegaly. Aortic valve calcification. Mediastinum/Nodes: Mildly distended esophagus. No adenopathy  identified. Lungs/Pleura: Trace bilateral pleural effusions. Chronic right upper lobe scarring with a nodular portion measuring 1.2 by 0.9 cm on image 36/4, previously the same on 05/08/2019. Right Airway thickening is present, suggesting bronchitis or reactive airways disease. Mild reticulonodular opacities in the left upper lobe and left lower lobe favoring atypical infectious bronchiolitis, similar to the prior exam in general. Bronchiectatic airway plugging in the left lower lobe as on image 70/4. There is some increase in peripheral nodularity in the left lower lobe including a pleural-based 1.3 by 0.9 cm nodule on image 90/4, probably inflammatory given that this was not present 3 months ago, but surveillance is probably warranted. No pneumothorax or pneumomediastinum. Musculoskeletal: No fracture  or acute bony findings identified. CT ABDOMEN PELVIS FINDINGS Hepatobiliary: Unremarkable Pancreas: Hypodense pancreatic lesion measuring 2.7 by 1.9 cm on image 69/2, not well appreciated on prior CT the abdomen from 11/26/2011. This has fluid density internally and could be intraductal papillary mucinous neoplasm or a postinflammatory pancreatic cystic lesion. Spleen: Unremarkable Adrenals/Urinary Tract: Adrenal glands normal. Severe atrophy of the right kidney. 1.5 cm in diameter exophytic fluid density lesion of the left kidney upper pole on image 50/5, likely a cyst. Similar fluid density 0.9 cm exophytic lesion from the left kidney lower pole, image 36/5. Vascular calcifications are present in the left renal hilum. Stomach/Bowel: Very large amount of free intraperitoneal gas, with additional scattered locules in the abdomen. No dilated small bowel loops. Pneumatosis and distension of the cecum, ascending colon, and transverse colon. Sigmoid colon diverticulosis. Vascular/Lymphatic: Upper abdominal aorta 5.0 cm transverse. Aneurysmal dilatation extends down into the infrarenal abdominal aorta, with a saccular  aneurysmal component just below the right renal artery. Infrarenal abdominal aortic stent grafts extending into the common iliac arteries bilaterally. Extensive atherosclerosis. Reproductive: Moderate prostatomegaly. Other: Small amount of free pelvic fluid. Stranding and fluid along the right paracolic gutter. Musculoskeletal: Lumbar spondylosis and degenerative disc disease at L5-S1. IMPRESSION: 1. Very large amount of free intraperitoneal gas with additional scattered locules in the abdomen. Pneumatosis and distension of the cecum, ascending colon, and transverse colon. Appearance suspicious for colon perforation and possible ischemia. Emergent surgical consultation recommended. 2. Extensive atherosclerosis with known abdominal aortic aneurysm. The infrarenal portion of the aneurysm has been treated with stent graft. 3. Trace bilateral pleural effusions. 4. Chronic reticulonodular opacities in the left upper lobe and left lower lobe favoring atypical infectious bronchiolitis. There are some new (from 3 months ago) nodules in the left lower lobe which are probably due to atypical infection, but which warrant surveillance imaging to exclude the unlikely possibility of new neoplastic nodules. 5. 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 6. Mild cardiomegaly. 7. Coronary, aortic arch, and branch vessel atherosclerotic vascular disease. Aortic valve calcification. 8. Hypodense pancreatic lesion measuring 2.7 by 1.9 cm, not well appreciated on prior CT abdomen from 11/26/2011. This is most likely to be an intraductal papillary mucinous neoplasm or a postinflammatory pancreatic cystic lesion, but enhancement characteristics are not assessed today. Although clearly the patient's current acute issues take precedence, follow up pancreatic protocol MRI with and without contrast would be recommended after complete resolution of the patient's acute situation in order to further characterize this lesion. 9. Severe  atrophy of the right kidney. 10. Abdominal aortic aneurysm, with suprarenal portion measuring up to 5.0 cm transverse. Infrarenal abdominal aortic stent grafts extending into the common iliac arteries bilaterally. 11. Moderate prostatomegaly. 12. Lumbar spondylosis and degenerative disc disease. Critical Value/emergent results were called by telephone at the time of interpretation on 08/07/2019 at 2:28 pm to provider Dr. Marjean Donna , who verbally acknowledged these results. Electronically Signed   By: Van Clines M.D.   On: 08/07/2019 14:32   CT Head Wo Contrast  Result Date: 08/07/2019 CLINICAL DATA:  Poly trauma, critical, head/cervical spine injury suspected. EXAM: CT HEAD WITHOUT CONTRAST CT CERVICAL SPINE WITHOUT CONTRAST TECHNIQUE: Multidetector CT imaging of the head and cervical spine was performed following the standard protocol without intravenous contrast. Multiplanar CT image reconstructions of the cervical spine were also generated. COMPARISON:  None. FINDINGS: CT HEAD FINDINGS Brain: Mild generalized age related parenchymal volume loss with commensurate dilatation of the ventricles and sulci. No mass,  hemorrhage, edema or other evidence of acute parenchymal abnormality. No extra-axial hemorrhage. Vascular: Chronic calcified atherosclerotic changes of the large vessels at the skull base. No unexpected hyperdense vessel. Skull: Normal. Negative for fracture or focal lesion. Sinuses/Orbits: No acute finding. Other: None. CT CERVICAL SPINE FINDINGS Alignment: Mild scoliosis. Slight reversal of the normal cervical spine lordosis. No evidence of acute vertebral body subluxation. Skull base and vertebrae: No fracture line or displaced fracture fragment seen. Facet joints are normally aligned. Soft tissues and spinal canal: No prevertebral fluid or swelling. No visible canal hematoma. Disc levels: Mild degenerative spondylosis at the C6-7 level. No significant central canal stenosis at any level.  Upper chest: RIGHT apical scarring, incompletely imaged, better demonstrated on earlier chest CT of 05/08/2019. No acute findings. Other: Bilateral carotid atherosclerosis. IMPRESSION: 1. No acute intracranial abnormality. No intracranial mass, hemorrhage or edema. No skull fracture. 2. No fracture or acute subluxation within the cervical spine. Mild scoliosis and slight reversal of the normal cervical spine lordosis is likely related to patient positioning or muscle spasm. 3. Carotid atherosclerosis. Electronically Signed   By: Franki Cabot M.D.   On: 08/07/2019 14:20   CT CHEST WO CONTRAST  Result Date: 08/07/2019 CLINICAL DATA:  Abdominal pain, shortness of breath, constipation. Syncope in the bathroom today. Tachypneic. EXAM: CT CHEST, ABDOMEN AND PELVIS WITHOUT CONTRAST TECHNIQUE: Multidetector CT imaging of the chest, abdomen and pelvis was performed following the standard protocol without IV contrast. COMPARISON:  Multiple exams, including CT chest 05/08/2019 and CT abdomen 11/26/2011 FINDINGS: CT CHEST FINDINGS Cardiovascular: AICD noted. Coronary, aortic arch, and branch vessel atherosclerotic vascular disease. Mild cardiomegaly. Aortic valve calcification. Mediastinum/Nodes: Mildly distended esophagus. No adenopathy identified. Lungs/Pleura: Trace bilateral pleural effusions. Chronic right upper lobe scarring with a nodular portion measuring 1.2 by 0.9 cm on image 36/4, previously the same on 05/08/2019. Right Airway thickening is present, suggesting bronchitis or reactive airways disease. Mild reticulonodular opacities in the left upper lobe and left lower lobe favoring atypical infectious bronchiolitis, similar to the prior exam in general. Bronchiectatic airway plugging in the left lower lobe as on image 70/4. There is some increase in peripheral nodularity in the left lower lobe including a pleural-based 1.3 by 0.9 cm nodule on image 90/4, probably inflammatory given that this was not present 3  months ago, but surveillance is probably warranted. No pneumothorax or pneumomediastinum. Musculoskeletal: No fracture or acute bony findings identified. CT ABDOMEN PELVIS FINDINGS Hepatobiliary: Unremarkable Pancreas: Hypodense pancreatic lesion measuring 2.7 by 1.9 cm on image 69/2, not well appreciated on prior CT the abdomen from 11/26/2011. This has fluid density internally and could be intraductal papillary mucinous neoplasm or a postinflammatory pancreatic cystic lesion. Spleen: Unremarkable Adrenals/Urinary Tract: Adrenal glands normal. Severe atrophy of the right kidney. 1.5 cm in diameter exophytic fluid density lesion of the left kidney upper pole on image 50/5, likely a cyst. Similar fluid density 0.9 cm exophytic lesion from the left kidney lower pole, image 36/5. Vascular calcifications are present in the left renal hilum. Stomach/Bowel: Very large amount of free intraperitoneal gas, with additional scattered locules in the abdomen. No dilated small bowel loops. Pneumatosis and distension of the cecum, ascending colon, and transverse colon. Sigmoid colon diverticulosis. Vascular/Lymphatic: Upper abdominal aorta 5.0 cm transverse. Aneurysmal dilatation extends down into the infrarenal abdominal aorta, with a saccular aneurysmal component just below the right renal artery. Infrarenal abdominal aortic stent grafts extending into the common iliac arteries bilaterally. Extensive atherosclerosis. Reproductive: Moderate prostatomegaly. Other: Small amount of  free pelvic fluid. Stranding and fluid along the right paracolic gutter. Musculoskeletal: Lumbar spondylosis and degenerative disc disease at L5-S1. IMPRESSION: 1. Very large amount of free intraperitoneal gas with additional scattered locules in the abdomen. Pneumatosis and distension of the cecum, ascending colon, and transverse colon. Appearance suspicious for colon perforation and possible ischemia. Emergent surgical consultation recommended. 2.  Extensive atherosclerosis with known abdominal aortic aneurysm. The infrarenal portion of the aneurysm has been treated with stent graft. 3. Trace bilateral pleural effusions. 4. Chronic reticulonodular opacities in the left upper lobe and left lower lobe favoring atypical infectious bronchiolitis. There are some new (from 3 months ago) nodules in the left lower lobe which are probably due to atypical infection, but which warrant surveillance imaging to exclude the unlikely possibility of new neoplastic nodules. 5. 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 6. Mild cardiomegaly. 7. Coronary, aortic arch, and branch vessel atherosclerotic vascular disease. Aortic valve calcification. 8. Hypodense pancreatic lesion measuring 2.7 by 1.9 cm, not well appreciated on prior CT abdomen from 11/26/2011. This is most likely to be an intraductal papillary mucinous neoplasm or a postinflammatory pancreatic cystic lesion, but enhancement characteristics are not assessed today. Although clearly the patient's current acute issues take precedence, follow up pancreatic protocol MRI with and without contrast would be recommended after complete resolution of the patient's acute situation in order to further characterize this lesion. 9. Severe atrophy of the right kidney. 10. Abdominal aortic aneurysm, with suprarenal portion measuring up to 5.0 cm transverse. Infrarenal abdominal aortic stent grafts extending into the common iliac arteries bilaterally. 11. Moderate prostatomegaly. 12. Lumbar spondylosis and degenerative disc disease. Critical Value/emergent results were called by telephone at the time of interpretation on 08/07/2019 at 2:28 pm to provider Dr. Marjean Donna , who verbally acknowledged these results. Electronically Signed   By: Van Clines M.D.   On: 08/07/2019 14:32   CT Cervical Spine Wo Contrast  Result Date: 08/07/2019 CLINICAL DATA:  Poly trauma, critical, head/cervical spine injury suspected.  EXAM: CT HEAD WITHOUT CONTRAST CT CERVICAL SPINE WITHOUT CONTRAST TECHNIQUE: Multidetector CT imaging of the head and cervical spine was performed following the standard protocol without intravenous contrast. Multiplanar CT image reconstructions of the cervical spine were also generated. COMPARISON:  None. FINDINGS: CT HEAD FINDINGS Brain: Mild generalized age related parenchymal volume loss with commensurate dilatation of the ventricles and sulci. No mass, hemorrhage, edema or other evidence of acute parenchymal abnormality. No extra-axial hemorrhage. Vascular: Chronic calcified atherosclerotic changes of the large vessels at the skull base. No unexpected hyperdense vessel. Skull: Normal. Negative for fracture or focal lesion. Sinuses/Orbits: No acute finding. Other: None. CT CERVICAL SPINE FINDINGS Alignment: Mild scoliosis. Slight reversal of the normal cervical spine lordosis. No evidence of acute vertebral body subluxation. Skull base and vertebrae: No fracture line or displaced fracture fragment seen. Facet joints are normally aligned. Soft tissues and spinal canal: No prevertebral fluid or swelling. No visible canal hematoma. Disc levels: Mild degenerative spondylosis at the C6-7 level. No significant central canal stenosis at any level. Upper chest: RIGHT apical scarring, incompletely imaged, better demonstrated on earlier chest CT of 05/08/2019. No acute findings. Other: Bilateral carotid atherosclerosis. IMPRESSION: 1. No acute intracranial abnormality. No intracranial mass, hemorrhage or edema. No skull fracture. 2. No fracture or acute subluxation within the cervical spine. Mild scoliosis and slight reversal of the normal cervical spine lordosis is likely related to patient positioning or muscle spasm. 3. Carotid atherosclerosis. Electronically Signed   By: Cherlynn Kaiser  Enriqueta Shutter M.D.   On: 08/07/2019 14:20    ____________________________________________   PROCEDURES  Procedure(s) performed (including  Critical Care):  .Critical Care Performed by: Vanessa [place], MD Authorized by: Vanessa [place], MD   Critical care provider statement:    Critical care time (minutes):  45   Critical care was necessary to treat or prevent imminent or life-threatening deterioration of the following conditions:  Sepsis   Critical care was time spent personally by me on the following activities:  Discussions with consultants, evaluation of patient's response to treatment, examination of patient, ordering and performing treatments and interventions, ordering and review of laboratory studies, ordering and review of radiographic studies, pulse oximetry, re-evaluation of patient's condition, obtaining history from patient or surrogate and review of old charts     ____________________________________________   INITIAL IMPRESSION / ASSESSMENT AND PLAN / ED COURSE  Phillip Allison. was evaluated in Emergency Department on 08/07/2019 for the symptoms described in the history of present illness. He was evaluated in the context of the global COVID-19 pandemic, which necessitated consideration that the patient might be at risk for infection with the SARS-CoV-2 virus that causes COVID-19. Institutional protocols and algorithms that pertain to the evaluation of patients at risk for COVID-19 are in a state of rapid change based on information released by regulatory bodies including the CDC and federal and state organizations. These policies and algorithms were followed during the patient's care in the ED.    Patient is a 79 year old who presents with syncopal episode and abdominal pain.  Will get labs to evaluate for electrolyte abnormalities, AKI.  Will get CT head and CT cervical evaluate for intracranial hemorrhage or cervical fracture.  Will get CT chest to evaluate for pneumonia given some increased work of breathing.  Will get CT abdomen to evaluate for perforation, obstruction given distended abdomen.  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  [employer] interrogation shows no episodes since November.  No shocks delivered.  Patient's kidney functions elevated.  Will have to do CT scans without contrast.  Patient's white count was 30 and lactate was elevated at 6.  Septic alert called.  Patient given broad-spectrum antibiotics and fluids.  Patient CT scan is concerning for perforation.  I did discuss with patient and the son and they would be open to considering surgical interventions.  Discussed with Dr. Hampton Abbot who is going to come evaluate the patient.   Patient handed off to oncoming team pending surgical evaluation and further decisions.   ____________________________________________   FINAL CLINICAL IMPRESSION(S) / ED DIAGNOSES   Final diagnoses:  Sepsis, due to unspecified organism, unspecified whether acute organ dysfunction present ([place])  Colon perforation ([place])  Syncope, unspecified syncope type      MEDICATIONS GIVEN DURING THIS VISIT:  Medications  lactated ringers bolus 1,000 mL (1,000 mLs Intravenous New Bag/Given 08/07/19 1507)    And  lactated ringers bolus 1,000 mL (0 mLs Intravenous Stopped 08/07/19 1505)    And  lactated ringers bolus 500 mL (has no administration in time range)  metroNIDAZOLE (FLAGYL) IVPB 500 mg (500 mg Intravenous New Bag/Given 08/07/19 1508)  vancomycin (VANCOCIN) IVPB 1000 mg/200 mL premix (1,000 mg Intravenous New Bag/Given 08/07/19 1509)  ceFEPIme (MAXIPIME) 2 g in sodium chloride 0.9 % 100 mL IVPB (0 g Intravenous Stopped 08/07/19 1506)     ED Discharge Orders    None       Note:  This document was prepared using Dragon voice recognition  software and may include unintentional dictation errors.   Vanessa Jasper, MD 08/07/19 [phone]

## 2019-08-07 NOTE — Consult Note (Signed)
PHARMACY -  BRIEF ANTIBIOTIC NOTE   Pharmacy has received consult(s) for Vancomycin and Cefepime from an ED provider.  The patient's profile has been reviewed for ht/wt/allergies/indication/available labs.    One time order(s) placed for vancomycin 1g IV and cefepime 2g IV   Further antibiotics/pharmacy consults should be ordered by admitting physician if indicated.                       Thank you, Pernell Dupre, PharmD, BCPS Clinical Pharmacist 08/07/2019 1:43 PM

## 2019-08-07 NOTE — Consult Note (Signed)
PULMONARY / CRITICAL CARE MEDICINE   Name: Phillip Allison. MRN: [id number] DOB: 03-Dec-1940    ADMISSION DATE:  08/07/2019 CONSULTATION DATE:  08/07/2019  REFERRING MD:  Dr. Hampton Abbot  CHIEF COMPLAINT:  Abdominal pain  BRIEF DISCUSSION: [name] with PMH of COPD on 2L [place], HFrEF, CAD, and CKD III admitted 08/07/19 with severe pneumoperitoneum and perforated viscus where he was taken for emergency exploratory Laparotomy.  He required subtotal colectomy with end ileostomy.  Also found to be positive for COVID-19.  Returns to ICU from OR, remains intubated. After arrival to ICU noted to be hypotensive concerning for developing septic shock, and possible cardiogenic shock due to questionable NSTEMI.     HISTORY OF PRESENT ILLNESS:   Phillip Allison is a 79 year old male with a past medical history notable for COPD on 2 L nasal cannula, HFrEF (EF 30 to 35% on 05/11/2019), CAD, hypertension, CKD stage III who presented to [facility] ED on 08/07/2019 with complaints of 2-day history of abdominal pain.  Patient is currently intubated and sedated with no family present, therefore history is obtained from ED and nursing notes.  Per notes his abdominal pain reportedly started yesterday in the lower abdomen, which progressively worsened.  He denied any fever, chills, nausea, vomiting, diarrhea.  While trying to have a bowel movement he had a syncopal event in the bathroom, therefore EMS was called.  Upon presentation to the ED his abdomen was noted to be significantly distended.  Initial work-up revealed WBC 30.5 with neutrophilia, procalcitonin 0.77, lactic acid 6.4, BNP 792, high-sensitivity troponin 56, BUN 59, creatinine 2.49, serum bicarb 20, anion gap 19.  His COVID-19 PCR is positive.  CT of the head and cervical spine are negative for any acute processes.  CT chest with trace bilateral pleural effusions and chronic reticulonodular opacities in the left upper lobe and left lower lobes concerning for  atypical infectious bronchiolitis and new nodules in the left lower lobe concerning for atypical infection. CT the abdomen & pelvis revealed significant pneumoperitoneum with pneumatosis of the right colon and transverse colon, with the source of the perforation unclear.  General surgery was consulted, who took the patient emergently to the OR for exploratory laparotomy. He was found to have a short segment of severe stricture at the sigmoid colon, causing large bowel obstruction with a significantly distended proximal colon, along with multiple areas of patchy necrosis proximal all the way to the cecum.  He ultimately required subtotal colectomy with end ileostomy.  He returns to the ICU post procedure, and remains intubated.  PCCM is consulted for further work-up and treatment of sepsis secondary to perforated viscus and COVID-19 infection.  After arrival to ICU patient noted to be hypotensive despite previous IV fluid resuscitation.  He subsequently is requiring vasopressors. EKG shows downsloping ST depression in the inferior and lateral leads concerning for NSTEMI.  Suspect developing septic shock with component of cardiogenic shock.   PAST MEDICAL HISTORY :  He  has a past medical history of CHF (congestive heart failure) ([place]), COPD (chronic obstructive pulmonary disease) ([place]), Depression, Emphysema lung ([place]), Hypercholesterolemia, Hypertension, Myocardial infarction ([place]), On home oxygen therapy, Pneumonia, Presence of permanent cardiac pacemaker, and Shortness of breath dyspnea.  PAST SURGICAL HISTORY: He  has a past surgical history that includes Angioplasty; pacemaker placement; Tonsillectomy; Insert / replace / remove pacemaker; Abdominal aortic aneurysm repair; Cataract extraction w/PHACO (Right, 12/18/2015); and implantable cardioverter defibrillator (icd) generator change (N/A, 05/12/2019).  No Known Allergies  No current  facility-administered medications on file prior to encounter.    Current Outpatient Medications on File Prior to Encounter  Medication Sig  . albuterol (PROVENTIL) (2.5 MG/3ML) 0.083% nebulizer solution Take 3 mLs (2.5 mg total) by nebulization every 6 (six) hours as needed for wheezing or shortness of breath.  Marland Kitchen amLODipine (NORVASC) 5 MG tablet Take 1 tablet (5 mg total) by mouth daily.  Marland Kitchen aspirin 81 MG tablet Take 81 mg by mouth at bedtime.   . carvedilol (COREG) 6.25 MG tablet Take 1 tablet (6.25 mg total) by mouth 2 (two) times daily with a meal.  . ENTRESTO 24-26 MG Take 1 tablet by mouth 2 (two) times daily.  . furosemide (LASIX) 20 MG tablet Take 20 mg by mouth 2 (two) times daily.  Marland Kitchen guaiFENesin (MUCINEX) 600 MG 12 hr tablet Take 1 tablet (600 mg total) by mouth 2 (two) times daily.  . pravastatin (PRAVACHOL) 40 MG tablet Take 1 tablet (40 mg total) by mouth at bedtime.  . sertraline (ZOLOFT) 50 MG tablet TAKE 1 TABLET(50 MG) BY MOUTH DAILY (Patient taking differently: Take 50 mg by mouth daily. )    FAMILY HISTORY:  His He indicated that his mother is deceased. He indicated that his father is deceased.   SOCIAL HISTORY: He  reports that he quit smoking about 7 years ago. His smoking use included cigarettes. He has a 40.50 pack-year smoking history. He has never used smokeless tobacco. He reports that he does not drink alcohol or use drugs.    COVID-19 DISASTER DECLARATION:  FULL CONTACT PHYSICAL EXAMINATION WAS NOT POSSIBLE DUE TO TREATMENT OF COVID-19 AND  CONSERVATION OF PERSONAL PROTECTIVE EQUIPMENT, LIMITED EXAM FINDINGS INCLUDE-  Patient assessed or the symptoms described in the history of present illness.  In the context of the Global COVID-19 pandemic, which necessitated consideration that the patient might be at risk for infection with the SARS-CoV-2 virus that causes COVID-19, Institutional protocols and algorithms that pertain to the evaluation of patients at risk for COVID-19 are in a state of rapid change based on information  released by regulatory bodies including the CDC and federal and state organizations. These policies and algorithms were followed during the patient's care while in hospital.   REVIEW OF SYSTEMS:   Unable to assess due to intubation and critical illness  SUBJECTIVE:  Unable to assess due to intubation and critical illness  VITAL SIGNS: BP (!) 143/59   Pulse 86   Temp 97.9 F (36.6 C) (Oral)   Resp 15   Ht '5\' 7"'$  (1.702 m)   Wt 79.4 kg   SpO2 94%   BMI 27.41 kg/m   HEMODYNAMICS:    VENTILATOR SETTINGS:    INTAKE / OUTPUT: I/O last 3 completed shifts: In: 2400.1 [IV Piggyback:2400.1] Out: -   PHYSICAL EXAMINATION: General: Acute on chronically ill-appearing male, laying in bed, intubated and sedated, no acute distress Neuro: Sedated, withdraws from pain, pupils PERRLA at 2 mm bilaterally HEENT: Atraumatic, normocephalic, neck supple, no JVD Cardiovascular: Regular rate and rhythm, 2+ pulses Lungs: Unable to auscultate due to CAPR, vent assisted, even, nonlabored Abdomen: Distended, no guarding or rebound tenderness  Musculoskeletal: Normal bulk and tone, no deformities, no edema Skin: Warm and dry, midline abdominal incision with honeycomb dressing clean dry and intact, JP drains to left upper quadrant and right lower quadrant  LABS:  BMET Recent Labs  Lab 08/07/19 1220  NA 137  K 4.8  CL 98  CO2 20*  BUN 59*  CREATININE  2.49*  GLUCOSE 212*    Electrolytes Recent Labs  Lab 08/07/19 1220  CALCIUM 8.8*    CBC Recent Labs  Lab 08/07/19 1220  WBC 30.5*  HGB 13.4  HCT 42.8  PLT 329    Coag's Recent Labs  Lab 08/07/19 1220  APTT 29  INR 1.4*    Sepsis Markers Recent Labs  Lab 08/07/19 1220  LATICACIDVEN 6.4*  PROCALCITON 0.77    ABG No results for input(s): PHART, PCO2ART, PO2ART in the last 168 hours.  Liver Enzymes Recent Labs  Lab 08/07/19 1220  AST 52*  ALT 19  ALKPHOS 74  BILITOT 1.0  ALBUMIN 3.5    Cardiac Enzymes No  results for input(s): TROPONINI, PROBNP in the last 168 hours.  Glucose No results for input(s): GLUCAP in the last 168 hours.  Imaging CT ABDOMEN PELVIS WO CONTRAST  Result Date: 08/07/2019 CLINICAL DATA:  Abdominal pain, shortness of breath, constipation. Syncope in the bathroom today. Tachypneic. EXAM: CT CHEST, ABDOMEN AND PELVIS WITHOUT CONTRAST TECHNIQUE: Multidetector CT imaging of the chest, abdomen and pelvis was performed following the standard protocol without IV contrast. COMPARISON:  Multiple exams, including CT chest 05/08/2019 and CT abdomen 11/26/2011 FINDINGS: CT CHEST FINDINGS Cardiovascular: AICD noted. Coronary, aortic arch, and branch vessel atherosclerotic vascular disease. Mild cardiomegaly. Aortic valve calcification. Mediastinum/Nodes: Mildly distended esophagus. No adenopathy identified. Lungs/Pleura: Trace bilateral pleural effusions. Chronic right upper lobe scarring with a nodular portion measuring 1.2 by 0.9 cm on image 36/4, previously the same on 05/08/2019. Right Airway thickening is present, suggesting bronchitis or reactive airways disease. Mild reticulonodular opacities in the left upper lobe and left lower lobe favoring atypical infectious bronchiolitis, similar to the prior exam in general. Bronchiectatic airway plugging in the left lower lobe as on image 70/4. There is some increase in peripheral nodularity in the left lower lobe including a pleural-based 1.3 by 0.9 cm nodule on image 90/4, probably inflammatory given that this was not present 3 months ago, but surveillance is probably warranted. No pneumothorax or pneumomediastinum. Musculoskeletal: No fracture or acute bony findings identified. CT ABDOMEN PELVIS FINDINGS Hepatobiliary: Unremarkable Pancreas: Hypodense pancreatic lesion measuring 2.7 by 1.9 cm on image 69/2, not well appreciated on prior CT the abdomen from 11/26/2011. This has fluid density internally and could be intraductal papillary mucinous  neoplasm or a postinflammatory pancreatic cystic lesion. Spleen: Unremarkable Adrenals/Urinary Tract: Adrenal glands normal. Severe atrophy of the right kidney. 1.5 cm in diameter exophytic fluid density lesion of the left kidney upper pole on image 50/5, likely a cyst. Similar fluid density 0.9 cm exophytic lesion from the left kidney lower pole, image 36/5. Vascular calcifications are present in the left renal hilum. Stomach/Bowel: Very large amount of free intraperitoneal gas, with additional scattered locules in the abdomen. No dilated small bowel loops. Pneumatosis and distension of the cecum, ascending colon, and transverse colon. Sigmoid colon diverticulosis. Vascular/Lymphatic: Upper abdominal aorta 5.0 cm transverse. Aneurysmal dilatation extends down into the infrarenal abdominal aorta, with a saccular aneurysmal component just below the right renal artery. Infrarenal abdominal aortic stent grafts extending into the common iliac arteries bilaterally. Extensive atherosclerosis. Reproductive: Moderate prostatomegaly. Other: Small amount of free pelvic fluid. Stranding and fluid along the right paracolic gutter. Musculoskeletal: Lumbar spondylosis and degenerative disc disease at L5-S1. IMPRESSION: 1. Very large amount of free intraperitoneal gas with additional scattered locules in the abdomen. Pneumatosis and distension of the cecum, ascending colon, and transverse colon. Appearance suspicious for colon perforation and possible ischemia. Emergent  surgical consultation recommended. 2. Extensive atherosclerosis with known abdominal aortic aneurysm. The infrarenal portion of the aneurysm has been treated with stent graft. 3. Trace bilateral pleural effusions. 4. Chronic reticulonodular opacities in the left upper lobe and left lower lobe favoring atypical infectious bronchiolitis. There are some new (from 3 months ago) nodules in the left lower lobe which are probably due to atypical infection, but which  warrant surveillance imaging to exclude the unlikely possibility of new neoplastic nodules. 5. 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 6. Mild cardiomegaly. 7. Coronary, aortic arch, and branch vessel atherosclerotic vascular disease. Aortic valve calcification. 8. Hypodense pancreatic lesion measuring 2.7 by 1.9 cm, not well appreciated on prior CT abdomen from 11/26/2011. This is most likely to be an intraductal papillary mucinous neoplasm or a postinflammatory pancreatic cystic lesion, but enhancement characteristics are not assessed today. Although clearly the patient's current acute issues take precedence, follow up pancreatic protocol MRI with and without contrast would be recommended after complete resolution of the patient's acute situation in order to further characterize this lesion. 9. Severe atrophy of the right kidney. 10. Abdominal aortic aneurysm, with suprarenal portion measuring up to 5.0 cm transverse. Infrarenal abdominal aortic stent grafts extending into the common iliac arteries bilaterally. 11. Moderate prostatomegaly. 12. Lumbar spondylosis and degenerative disc disease. Critical Value/emergent results were called by telephone at the time of interpretation on 08/07/2019 at 2:28 pm to provider Dr. Marjean Donna , who verbally acknowledged these results. Electronically Signed   By: Van Clines M.D.   On: 08/07/2019 14:32   CT Head Wo Contrast  Result Date: 08/07/2019 CLINICAL DATA:  Poly trauma, critical, head/cervical spine injury suspected. EXAM: CT HEAD WITHOUT CONTRAST CT CERVICAL SPINE WITHOUT CONTRAST TECHNIQUE: Multidetector CT imaging of the head and cervical spine was performed following the standard protocol without intravenous contrast. Multiplanar CT image reconstructions of the cervical spine were also generated. COMPARISON:  None. FINDINGS: CT HEAD FINDINGS Brain: Mild generalized age related parenchymal volume loss with commensurate dilatation of the  ventricles and sulci. No mass, hemorrhage, edema or other evidence of acute parenchymal abnormality. No extra-axial hemorrhage. Vascular: Chronic calcified atherosclerotic changes of the large vessels at the skull base. No unexpected hyperdense vessel. Skull: Normal. Negative for fracture or focal lesion. Sinuses/Orbits: No acute finding. Other: None. CT CERVICAL SPINE FINDINGS Alignment: Mild scoliosis. Slight reversal of the normal cervical spine lordosis. No evidence of acute vertebral body subluxation. Skull base and vertebrae: No fracture line or displaced fracture fragment seen. Facet joints are normally aligned. Soft tissues and spinal canal: No prevertebral fluid or swelling. No visible canal hematoma. Disc levels: Mild degenerative spondylosis at the C6-7 level. No significant central canal stenosis at any level. Upper chest: RIGHT apical scarring, incompletely imaged, better demonstrated on earlier chest CT of 05/08/2019. No acute findings. Other: Bilateral carotid atherosclerosis. IMPRESSION: 1. No acute intracranial abnormality. No intracranial mass, hemorrhage or edema. No skull fracture. 2. No fracture or acute subluxation within the cervical spine. Mild scoliosis and slight reversal of the normal cervical spine lordosis is likely related to patient positioning or muscle spasm. 3. Carotid atherosclerosis. Electronically Signed   By: Franki Cabot M.D.   On: 08/07/2019 14:20   CT CHEST WO CONTRAST  Result Date: 08/07/2019 CLINICAL DATA:  Abdominal pain, shortness of breath, constipation. Syncope in the bathroom today. Tachypneic. EXAM: CT CHEST, ABDOMEN AND PELVIS WITHOUT CONTRAST TECHNIQUE: Multidetector CT imaging of the chest, abdomen and pelvis was performed following the standard protocol  without IV contrast. COMPARISON:  Multiple exams, including CT chest 05/08/2019 and CT abdomen 11/26/2011 FINDINGS: CT CHEST FINDINGS Cardiovascular: AICD noted. Coronary, aortic arch, and branch vessel  atherosclerotic vascular disease. Mild cardiomegaly. Aortic valve calcification. Mediastinum/Nodes: Mildly distended esophagus. No adenopathy identified. Lungs/Pleura: Trace bilateral pleural effusions. Chronic right upper lobe scarring with a nodular portion measuring 1.2 by 0.9 cm on image 36/4, previously the same on 05/08/2019. Right Airway thickening is present, suggesting bronchitis or reactive airways disease. Mild reticulonodular opacities in the left upper lobe and left lower lobe favoring atypical infectious bronchiolitis, similar to the prior exam in general. Bronchiectatic airway plugging in the left lower lobe as on image 70/4. There is some increase in peripheral nodularity in the left lower lobe including a pleural-based 1.3 by 0.9 cm nodule on image 90/4, probably inflammatory given that this was not present 3 months ago, but surveillance is probably warranted. No pneumothorax or pneumomediastinum. Musculoskeletal: No fracture or acute bony findings identified. CT ABDOMEN PELVIS FINDINGS Hepatobiliary: Unremarkable Pancreas: Hypodense pancreatic lesion measuring 2.7 by 1.9 cm on image 69/2, not well appreciated on prior CT the abdomen from 11/26/2011. This has fluid density internally and could be intraductal papillary mucinous neoplasm or a postinflammatory pancreatic cystic lesion. Spleen: Unremarkable Adrenals/Urinary Tract: Adrenal glands normal. Severe atrophy of the right kidney. 1.5 cm in diameter exophytic fluid density lesion of the left kidney upper pole on image 50/5, likely a cyst. Similar fluid density 0.9 cm exophytic lesion from the left kidney lower pole, image 36/5. Vascular calcifications are present in the left renal hilum. Stomach/Bowel: Very large amount of free intraperitoneal gas, with additional scattered locules in the abdomen. No dilated small bowel loops. Pneumatosis and distension of the cecum, ascending colon, and transverse colon. Sigmoid colon diverticulosis.  Vascular/Lymphatic: Upper abdominal aorta 5.0 cm transverse. Aneurysmal dilatation extends down into the infrarenal abdominal aorta, with a saccular aneurysmal component just below the right renal artery. Infrarenal abdominal aortic stent grafts extending into the common iliac arteries bilaterally. Extensive atherosclerosis. Reproductive: Moderate prostatomegaly. Other: Small amount of free pelvic fluid. Stranding and fluid along the right paracolic gutter. Musculoskeletal: Lumbar spondylosis and degenerative disc disease at L5-S1. IMPRESSION: 1. Very large amount of free intraperitoneal gas with additional scattered locules in the abdomen. Pneumatosis and distension of the cecum, ascending colon, and transverse colon. Appearance suspicious for colon perforation and possible ischemia. Emergent surgical consultation recommended. 2. Extensive atherosclerosis with known abdominal aortic aneurysm. The infrarenal portion of the aneurysm has been treated with stent graft. 3. Trace bilateral pleural effusions. 4. Chronic reticulonodular opacities in the left upper lobe and left lower lobe favoring atypical infectious bronchiolitis. There are some new (from 3 months ago) nodules in the left lower lobe which are probably due to atypical infection, but which warrant surveillance imaging to exclude the unlikely possibility of new neoplastic nodules. 5. 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 6. Mild cardiomegaly. 7. Coronary, aortic arch, and branch vessel atherosclerotic vascular disease. Aortic valve calcification. 8. Hypodense pancreatic lesion measuring 2.7 by 1.9 cm, not well appreciated on prior CT abdomen from 11/26/2011. This is most likely to be an intraductal papillary mucinous neoplasm or a postinflammatory pancreatic cystic lesion, but enhancement characteristics are not assessed today. Although clearly the patient's current acute issues take precedence, follow up pancreatic protocol MRI with and  without contrast would be recommended after complete resolution of the patient's acute situation in order to further characterize this lesion. 9. Severe atrophy of the right  kidney. 10. Abdominal aortic aneurysm, with suprarenal portion measuring up to 5.0 cm transverse. Infrarenal abdominal aortic stent grafts extending into the common iliac arteries bilaterally. 11. Moderate prostatomegaly. 12. Lumbar spondylosis and degenerative disc disease. Critical Value/emergent results were called by telephone at the time of interpretation on 08/07/2019 at 2:28 pm to provider Dr. Marjean Donna , who verbally acknowledged these results. Electronically Signed   By: Van Clines M.D.   On: 08/07/2019 14:32   CT Cervical Spine Wo Contrast  Result Date: 08/07/2019 CLINICAL DATA:  Poly trauma, critical, head/cervical spine injury suspected. EXAM: CT HEAD WITHOUT CONTRAST CT CERVICAL SPINE WITHOUT CONTRAST TECHNIQUE: Multidetector CT imaging of the head and cervical spine was performed following the standard protocol without intravenous contrast. Multiplanar CT image reconstructions of the cervical spine were also generated. COMPARISON:  None. FINDINGS: CT HEAD FINDINGS Brain: Mild generalized age related parenchymal volume loss with commensurate dilatation of the ventricles and sulci. No mass, hemorrhage, edema or other evidence of acute parenchymal abnormality. No extra-axial hemorrhage. Vascular: Chronic calcified atherosclerotic changes of the large vessels at the skull base. No unexpected hyperdense vessel. Skull: Normal. Negative for fracture or focal lesion. Sinuses/Orbits: No acute finding. Other: None. CT CERVICAL SPINE FINDINGS Alignment: Mild scoliosis. Slight reversal of the normal cervical spine lordosis. No evidence of acute vertebral body subluxation. Skull base and vertebrae: No fracture line or displaced fracture fragment seen. Facet joints are normally aligned. Soft tissues and spinal canal: No prevertebral  fluid or swelling. No visible canal hematoma. Disc levels: Mild degenerative spondylosis at the C6-7 level. No significant central canal stenosis at any level. Upper chest: RIGHT apical scarring, incompletely imaged, better demonstrated on earlier chest CT of 05/08/2019. No acute findings. Other: Bilateral carotid atherosclerosis. IMPRESSION: 1. No acute intracranial abnormality. No intracranial mass, hemorrhage or edema. No skull fracture. 2. No fracture or acute subluxation within the cervical spine. Mild scoliosis and slight reversal of the normal cervical spine lordosis is likely related to patient positioning or muscle spasm. 3. Carotid atherosclerosis. Electronically Signed   By: Franki Cabot M.D.   On: 08/07/2019 14:20     STUDIES:  2/13- CT Head w/o Contrast>> No acute intracranial abnormality. No intracranial mass, hemorrhage or edema. No skull fracture 2/13- CT Cervical Spine w/o contrast>>No fracture or acute subluxation within the cervical spine. Mild scoliosis and slight reversal of the normal cervical spine lordosis is likely related to patient positioning or muscle spasm. Carotid atherosclerosis 2/13- CT Abdomen & Pelvis w/o contrast>> 1. Very large amount of free intraperitoneal gas with additional scattered locules in the abdomen. Pneumatosis and distension of the cecum, ascending colon, and transverse colon. Appearance suspicious for colon perforation and possible ischemia. Emergent surgical consultation recommended. 2. Extensive atherosclerosis with known abdominal aortic aneurysm. The infrarenal portion of the aneurysm has been treated with stent Graft. Hypodense pancreatic lesion measuring 2.7 by 1.9 cm, not well appreciated on prior CT abdomen from 11/26/2011. This is most likely to be an intraductal papillary mucinous neoplasm or a postinflammatory pancreatic cystic lesion, but enhancement characteristics are not assessed today. Although clearly the patient's current  acute issues take precedence, follow up pancreatic protocol MRI with and without contrast would be recommended after complete resolution of the patient's acute situation in order to further characterize this lesion. 9. Severe atrophy of the right kidney. 10. Abdominal aortic aneurysm, with suprarenal portion measuring up to 5.0 cm transverse. Infrarenal abdominal aortic stent grafts extending into the common iliac arteries bilaterally. 11. Moderate prostatomegaly.  12. Lumbar spondylosis and degenerative disc disease 2/13- CT Chest w/o contrast >>1. Trace bilateral pleural effusions. 2. Chronic reticulonodular opacities in the left upper lobe and left lower lobe favoring atypical infectious bronchiolitis. There are some new (from 3 months ago) nodules in the left lower lobe which are probably due to atypical infection, but which warrant surveillance imaging to exclude the unlikely possibility of new neoplastic nodules. 3. 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 4. Mild cardiomegaly. 5. Coronary, aortic arch, and branch vessel atherosclerotic vascular disease. Aortic valve calcification  CULTURES: SARS-CoV-2 PCR 2/13>> POSITIVE Influenza PCR 2/13>> negative Blood culture x2 2/13>> Tracheal aspirate 2/13>> Strep pneumo urinary antigen 2/13>> Legionella urinary antigen 2/13>>  ANTIBIOTICS: Cefepime x1 dose in ED 2/13 Flagyl x1 dose in ED 2/13 Vancomycin x1 dose in ED 2/13 Zosyn 2/13>> Remdesivir 2/13>>  SIGNIFICANT EVENTS: 2/13>> Take emergently to OR for Exploratory Laparotomy 2/13>> Admitted to ICU, remains intubated post procedure 2/13>>Hypotensive, requiring vasopressors 2/14>> Right IJ CVC placed 2/14>> ST depression in inferior and lateral leads, Cardiology consulted  LINES/TUBES: ETT 2/13>> Right IJ CVC 2/14>>  ASSESSMENT / PLAN:  PULMONARY A: Postop ventilator management COVID-19 infection Hx: COPD on 2 L nasal cannula P:   Full vent  support Wean FiO2 and PEEP as tolerated to maintain O2 sats 88 to 94% Follow intermittent chest x-ray and ABG as needed Spontaneous breathing trial when respiratory parameters met Place on Remdesivir & Decadron  CARDIOVASCULAR A:  Septic shock secondary to perforated viscus +/- Cardiogenic shock Mildly elevated troponin, likely demand ischemia vs. NSTEMI  Elevated BNP Hx: HFrEF (30-35%), CAD, MI, Cardiac pacemaker, HTN, AAA with EVAR P:  Continuous cardiac monitoring Maintain MAP >65 IV Fluids Received fluid resuscitation in ED Vasopressors if needed to maintain MAP goal Trend Troponin until downtrending EKG w/ downsloping ST depression in inferior & Lateral leads Consult Cardiology, appreciate input Unable to place on Heparin gtt at this time due to surgery earlier this evening Unable to diurese at this time due to hypotension Hold home BP medications  RENAL A:   AKI on CKD stage III Anion Gap Metabolic Acidosis secondary to Lactic Acidosis P:   Monitor I&O's / urinary output Follow BMP Ensure adequate renal perfusion Avoid nephrotoxic agents as able Replace electrolytes as indicated IV Fluids Trend Lactic acid until normalized  GASTROINTESTINAL A:   Acute pneumoperitoneum w/ perforated viscus P:   N.p.o.  NG tube to LIS General surgery following, appreciate input Taken for emergent exploratory laparotomy on 2/13 requiring subtotal colectomy & end ileostomy On Zosyn Protonix for Stress ulcer prophylaxis  HEMATOLOGIC A:   No acute issues P:  Monitor for S/Sx of bleeding Trend CBC SCD's & SQ Heparin for VTE Prophylaxis (SQ Heparin to start on 2/14) Transfuse for Hgb <8   INFECTIOUS A:   Sepsis secondary to perforated bowel COVID-19 infection P:   Monitor fever curve Trend WBCs and procalcitonin Follow cultures as above Received Cefepime, Flagyl, and Vancomycin in ED 2/13 Continue Zosyn Place on remdesivir & Decadron Once able to use NG tube for  meds, will place on Vit. C & D, Zinc  ENDOCRINE A:   Hyperglycemia P:   CBGs Sliding-scale insulin Follow ICU hypo-/hyperglycemia protocol  NEUROLOGIC A:   Sedation needs in setting of mechanical ventilation Hx: Depression P:   RASS goal: 0 to -1 Fentanyl gtt and prn versed pushes to maintain RASS goal Avoid sedating meds as able Daily wake up assessments Provide supportive care   FAMILY  -  Updates: Updated pt's son Trindon Dorton via telephone.  He confirms is dad is a DNR.  He would like to try aggressive measures for the short term to see if his father can recover, but would not want tracheostomy or feeding tube.  - Inter-disciplinary family meet or Palliative Care meeting due by:  08/14/2019  Pt's prognosis is guarded, and he is high risk for cardiac arrest and death.   [name], AGACNP-BC [facility] Pager: [phone]  08/07/2019, 9:07 PM

## 2019-08-07 NOTE — Transfer of Care (Signed)
Immediate Anesthesia Transfer of Care Note  Patient: Phillip Allison.  Procedure(s) Performed: EXPLORATORY LAPAROTOMY (N/A ) SMALL BOWEL RESECTION (N/A ) OSTOMY (N/A )  Patient Location: PACU and ICU  Anesthesia Type:General  Level of Consciousness: sedated and Patient remains intubated per anesthesia plan  Airway & Oxygen Therapy: Patient remains intubated per anesthesia plan and Patient placed on Ventilator (see vital sign flow sheet for setting)  Post-op Assessment: Report given to RN and Post -op Vital signs reviewed and stable  Post vital signs: Reviewed and stable  Last Vitals:  Vitals Value Taken Time  BP 127/55 08/07/19 2119  Temp 35.6 C 08/07/19 2119  Pulse 81 08/07/19 2130  Resp 18 08/07/19 2130  SpO2 100 % 08/07/19 2130  Vitals shown include unvalidated device data.  Last Pain:  Vitals:   08/07/19 2119  TempSrc: Oral  PainSc: 0-No pain         Complications: No apparent anesthesia complications

## 2019-08-07 NOTE — Anesthesia Procedure Notes (Addendum)
Procedure Name: Intubation Date/Time: 08/07/2019 5:36 PM Performed by: Sherrine Maples, CRNA Pre-anesthesia Checklist: Patient identified, Patient being monitored, Timeout performed, Emergency Drugs available and Suction available Patient Re-evaluated:Patient Re-evaluated prior to induction Oxygen Delivery Method: Circle system utilized Preoxygenation: Pre-oxygenation with 100% oxygen Induction Type: IV induction Laryngoscope Size: Miller and 2 Grade View: Grade I Tube type: Oral Tube size: 7.5 mm Number of attempts: 1 Airway Equipment and Method: Stylet Placement Confirmation: ETT inserted through vocal cords under direct vision,  positive ETCO2 and breath sounds checked- equal and bilateral Secured at: 23 cm Tube secured with: Tape Dental Injury: Teeth and Oropharynx as per pre-operative assessment

## 2019-08-07 NOTE — Progress Notes (Signed)
Code Sepsis called in the ED and patient was taken emergently to OR before second lactic acid could be drawn. Initial Lactic acid 6.4. Received appropriate treatments , based on Sepsis Guidelines. Recommend getting repeat LA when he arrives to the ICU.

## 2019-08-07 NOTE — Progress Notes (Signed)
Remdesivir - Pharmacy Brief Note   O:  ALT: 19  CXR:  SpO2: 94 % on RA   A/P:  Remdesivir 200 mg IVPB once followed by 100 mg IVPB daily x 4 days.   Phillip Allison D 08/07/2019 9:17 PM

## 2019-08-07 NOTE — ED Notes (Signed)
Pt son at bedside due to pt's condition. Dr. Jari Pigg, MD and Charge Nurse, Theadora Rama, RN agreed that pt's son would be able to stay at bedside. Son dressed in gown, gloves and mask. Explained to son that once he is leaves he would be unable to come back today and I wouldn't know if he would be able to come back when the pt got upstairs due to the COVID status.

## 2019-08-07 NOTE — ED Notes (Signed)
Pt is currently with CT. LR and cefepime prepped and hanging in room.

## 2019-08-07 NOTE — Progress Notes (Signed)
Called and updated pt's son Kayhan Boardley of his father being admitted to ICU post surgery, and with developing septic shock in need of vasopressors.  He has given consent for placement of central line.    [name] also reports that his dad has told him that he would like to be a DNR.  Per [name], his father would not want long term life support.  He is in agreement with DNR status, and would like to give his dad several days to see if he can recover.  However he would not wake a tracheostomy or feeding tube.  DNR orders placed.     [name], AGACNP-BC [facility] Pager: [phone]

## 2019-08-07 NOTE — H&P (Signed)
Date of Admission:  08/07/2019  Reason for Admission:  Pneumoperitoneum  History of Present Illness: Phillip Allison. is a 79 y.o. male presenting with a 2-day history of abdominal pain.  The patient reports that the pain started yesterday, in the lower abdomen.  It progressively worsened.  Denies any fevers, chills, nausea, vomiting.  He reports having constipation and had a syncopal event today in the bathroom.  He was brought via EMS.  In the ED, was noted to be significantly distended in his abdomen.  Workup included CT head/cspine as well as CT chest, abdomen, and pelvis.  He was found to have significant pneumoperitoneum with pneumatosis of the right colon and transverse colon.  The source of perforation is unclear.  I have independently viewed the images and agree with the findings.  Of note, he also tested positive for COVID-19, but it seems the patient had been otherwise asymptomatic.  He has COPD and is on 2L [place] at baseline, has history of CHF and takes a diuretic, history of HTN/CAD and is on Aspirin and has a pacemaker, and also a history of EVAR for a AAA.  He had an Echocardiogram on 05/11/19 which showed an EF of 30-35%.  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  No prior abdominal surgeries.   Past Medical History: Past Medical History:  Diagnosis Date  . CHF (congestive heart failure) ([place])   . COPD (chronic obstructive pulmonary disease) ([place])   . Depression   . Emphysema lung ([place])   . Hypercholesterolemia   . Hypertension   . Myocardial infarction ([place])   . On home oxygen therapy    2 liters at night and as needed  . Pneumonia    in the past  . Presence of permanent cardiac pacemaker   . Shortness of breath dyspnea    with exertion     Past Surgical History: Past Surgical History:  Procedure Laterality Date  . ABDOMINAL AORTIC ANEURYSM REPAIR    . ANGIOPLASTY     left lower extremity  . CATARACT EXTRACTION W/PHACO Right  12/18/2015   Procedure: CATARACT EXTRACTION PHACO AND INTRAOCULAR LENS PLACEMENT (IOC);  Surgeon: Estill Cotta, MD;  Location: [facility] ORS;  Service: Ophthalmology;  Laterality: Right;  [place] 01:58 AP% 23.5 CDE 50.91 fluid pack lot # [id number]  . IMPLANTABLE CARDIOVERTER DEFIBRILLATOR (ICD) GENERATOR CHANGE N/A 05/12/2019   Procedure: ICD GENERATOR CHANGE;  Surgeon: Isaias Cowman, MD;  Location: [facility] ORS;  Service: Cardiovascular;  Laterality: N/A;  . INSERT / REPLACE / REMOVE PACEMAKER    . PACEMAKER PLACEMENT    . TONSILLECTOMY      Home Medications: Prior to Admission medications   Medication Sig Start Date End Date Taking? Authorizing Provider  albuterol (PROVENTIL) (2.5 MG/3ML) 0.083% nebulizer solution Take 3 mLs (2.5 mg total) by nebulization every 6 (six) hours as needed for wheezing or shortness of breath. 07/05/19  Yes Jerrol Banana., MD  amLODipine (NORVASC) 5 MG tablet Take 1 tablet (5 mg total) by mouth daily. 10/10/17  Yes Fritzi Mandes, MD  aspirin 81 MG tablet Take 81 mg by mouth at bedtime.  08/22/11  Yes [provider]  carvedilol (COREG) 6.25 MG tablet Take 1 tablet (6.25 mg total) by mouth 2 (two) times daily with a meal. 05/12/19  Yes Ivor Costa, MD  ENTRESTO 24-26 MG Take 1 tablet by mouth 2 (two) times daily. 05/05/19  Yes [provider]  furosemide (LASIX) 20 MG tablet  Take 20 mg by mouth 2 (two) times daily. 05/25/19  Yes [provider]  guaiFENesin (MUCINEX) 600 MG 12 hr tablet Take 1 tablet (600 mg total) by mouth 2 (two) times daily. 05/12/19  Yes Ivor Costa, MD  pravastatin (PRAVACHOL) 40 MG tablet Take 1 tablet (40 mg total) by mouth at bedtime. 10/12/18  Yes Jerrol Banana., MD  sertraline (ZOLOFT) 50 MG tablet TAKE 1 TABLET(50 MG) BY MOUTH DAILY Patient taking differently: Take 50 mg by mouth daily.  08/03/19  Yes Jerrol Banana., MD    Allergies: No Known Allergies  Social History:  reports that he quit  smoking about 7 years ago. His smoking use included cigarettes. He has a 40.50 pack-year smoking history. He has never used smokeless tobacco. He reports that he does not drink alcohol or use drugs.   Family History: Family History  Problem Relation Age of Onset  . Psoriasis Mother   . Heart attack Father     Review of Systems: ROS  Physical Exam BP (!) 146/58   Pulse 85   Temp 97.9 F (36.6 C) (Oral)   Resp (!) 25   Ht 5\' 7"  (1.702 m)   Wt 79.4 kg   SpO2 98%   BMI 27.41 kg/m  CONSTITUTIONAL: No acute distress at this point. HEENT:  Normocephalic, atraumatic, extraocular motion intact. NECK: Trachea is midline, and there is no jugular venous distension.  RESPIRATORY:  Lungs are clear, and breath sounds are equal bilaterally. Normal respiratory effort without pathologic use of accessory muscles. CARDIOVASCULAR: Heart is regular without murmurs, gallops, or rubs. GI: The abdomen is soft, very distended and tympanic, with tenderness in the lower abdomen. Surprisingly he is not acting peritoneal or toxic.  There were no palpable masses.  MUSCULOSKELETAL:  Normal muscle strength and tone in all four extremities.  No peripheral edema or cyanosis. SKIN: Skin turgor is normal. There are no pathologic skin lesions.  NEUROLOGIC:  Motor and sensation is grossly normal.  Cranial nerves are grossly intact. PSYCH:  Alert and oriented to person, place and time. Affect is normal.  Laboratory Analysis: Results for orders placed or performed during the hospital encounter of 08/07/19 (from the past 24 hour(s))  CBC with Differential     Status: Abnormal   Collection Time: 08/07/19 12:20 PM  Result Value Ref Range   WBC 30.5 (H) 4.0 - 10.5 K/uL   RBC 4.73 4.22 - 5.81 MIL/uL   Hemoglobin 13.4 13.0 - 17.0 g/dL   HCT 42.8 39.0 - 52.0 %   MCV 90.5 80.0 - 100.0 fL   MCH 28.3 26.0 - 34.0 pg   MCHC 31.3 30.0 - 36.0 g/dL   RDW 14.0 11.5 - 15.5 %   Platelets 329 150 - 400 K/uL   nRBC 0.0 0.0 - 0.2  %   Neutrophils Relative % 76 %   Neutro Abs 23.4 (H) 1.7 - 7.7 K/uL   Lymphocytes Relative 16 %   Lymphs Abs 4.8 (H) 0.7 - 4.0 K/uL   Monocytes Relative 6 %   Monocytes Absolute 1.7 (H) 0.1 - 1.0 K/uL   Eosinophils Relative 0 %   Eosinophils Absolute 0.0 0.0 - 0.5 K/uL   Basophils Relative 0 %   Basophils Absolute 0.1 0.0 - 0.1 K/uL   WBC Morphology MORPHOLOGY UNREMARKABLE    Smear Review Normal platelet morphology    Immature Granulocytes 2 %   Abs Immature Granulocytes 0.59 (H) 0.00 - 0.07 K/uL   [place]  PRESENT   Comprehensive metabolic panel     Status: Abnormal   Collection Time: 08/07/19 12:20 PM  Result Value Ref Range   Sodium 137 135 - 145 mmol/L   Potassium 4.8 3.5 - 5.1 mmol/L   Chloride 98 98 - 111 mmol/L   CO2 20 (L) 22 - 32 mmol/L   Glucose, Bld 212 (H) 70 - 99 mg/dL   BUN 59 (H) 8 - 23 mg/dL   Creatinine, Ser 2.49 (H) 0.61 - 1.24 mg/dL   Calcium 8.8 (L) 8.9 - 10.3 mg/dL   Total Protein 7.1 6.5 - 8.1 g/dL   Albumin 3.5 3.5 - 5.0 g/dL   AST 52 (H) 15 - 41 U/L   ALT 19 0 - 44 U/L   Alkaline Phosphatase 74 38 - 126 U/L   Total Bilirubin 1.0 0.3 - 1.2 mg/dL   GFR calc non Af Amer 24 (L) >60 mL/min   GFR calc Af Amer 28 (L) >60 mL/min   Anion gap 19 (H) 5 - 15  Lipase, blood     Status: None   Collection Time: 08/07/19 12:20 PM  Result Value Ref Range   Lipase 18 11 - 51 U/L  Troponin I (High Sensitivity)     Status: Abnormal   Collection Time: 08/07/19 12:20 PM  Result Value Ref Range   Troponin I (High Sensitivity) 56 (H) <18 ng/L  Brain natriuretic peptide     Status: Abnormal   Collection Time: 08/07/19 12:20 PM  Result Value Ref Range   B Natriuretic Peptide 792.0 (H) 0.0 - 100.0 pg/mL  Lactic acid, plasma     Status: Abnormal   Collection Time: 08/07/19 12:20 PM  Result Value Ref Range   Lactic Acid, Venous 6.4 (HH) 0.5 - 1.9 mmol/L  Protime-INR     Status: Abnormal   Collection Time: 08/07/19 12:20 PM  Result Value Ref Range   Prothrombin Time  16.9 (H) 11.4 - 15.2 seconds   INR 1.4 (H) 0.8 - 1.2  APTT     Status: None   Collection Time: 08/07/19 12:20 PM  Result Value Ref Range   aPTT 29 24 - 36 seconds  Procalcitonin - Baseline     Status: None   Collection Time: 08/07/19 12:20 PM  Result Value Ref Range   Procalcitonin 0.77 ng/mL  Respiratory Panel by RT PCR (Flu A&B, Covid) - Nasopharyngeal Swab     Status: Abnormal   Collection Time: 08/07/19 12:59 PM   Specimen: Nasopharyngeal Swab  Result Value Ref Range   SARS Coronavirus 2 by RT PCR POSITIVE (A) NEGATIVE   Influenza A by PCR NEGATIVE NEGATIVE   Influenza B by PCR NEGATIVE NEGATIVE    Imaging: CT ABDOMEN PELVIS WO CONTRAST  Result Date: 08/07/2019 CLINICAL DATA:  Abdominal pain, shortness of breath, constipation. Syncope in the bathroom today. Tachypneic. EXAM: CT CHEST, ABDOMEN AND PELVIS WITHOUT CONTRAST TECHNIQUE: Multidetector CT imaging of the chest, abdomen and pelvis was performed following the standard protocol without IV contrast. COMPARISON:  Multiple exams, including CT chest 05/08/2019 and CT abdomen 11/26/2011 FINDINGS: CT CHEST FINDINGS Cardiovascular: AICD noted. Coronary, aortic arch, and branch vessel atherosclerotic vascular disease. Mild cardiomegaly. Aortic valve calcification. Mediastinum/Nodes: Mildly distended esophagus. No adenopathy identified. Lungs/Pleura: Trace bilateral pleural effusions. Chronic right upper lobe scarring with a nodular portion measuring 1.2 by 0.9 cm on image 36/4, previously the same on 05/08/2019. Right Airway thickening is present, suggesting bronchitis or reactive airways disease. Mild reticulonodular opacities in the left  upper lobe and left lower lobe favoring atypical infectious bronchiolitis, similar to the prior exam in general. Bronchiectatic airway plugging in the left lower lobe as on image 70/4. There is some increase in peripheral nodularity in the left lower lobe including a pleural-based 1.3 by 0.9 cm nodule on  image 90/4, probably inflammatory given that this was not present 3 months ago, but surveillance is probably warranted. No pneumothorax or pneumomediastinum. Musculoskeletal: No fracture or acute bony findings identified. CT ABDOMEN PELVIS FINDINGS Hepatobiliary: Unremarkable Pancreas: Hypodense pancreatic lesion measuring 2.7 by 1.9 cm on image 69/2, not well appreciated on prior CT the abdomen from 11/26/2011. This has fluid density internally and could be intraductal papillary mucinous neoplasm or a postinflammatory pancreatic cystic lesion. Spleen: Unremarkable Adrenals/Urinary Tract: Adrenal glands normal. Severe atrophy of the right kidney. 1.5 cm in diameter exophytic fluid density lesion of the left kidney upper pole on image 50/5, likely a cyst. Similar fluid density 0.9 cm exophytic lesion from the left kidney lower pole, image 36/5. Vascular calcifications are present in the left renal hilum. Stomach/Bowel: Very large amount of free intraperitoneal gas, with additional scattered locules in the abdomen. No dilated small bowel loops. Pneumatosis and distension of the cecum, ascending colon, and transverse colon. Sigmoid colon diverticulosis. Vascular/Lymphatic: Upper abdominal aorta 5.0 cm transverse. Aneurysmal dilatation extends down into the infrarenal abdominal aorta, with a saccular aneurysmal component just below the right renal artery. Infrarenal abdominal aortic stent grafts extending into the common iliac arteries bilaterally. Extensive atherosclerosis. Reproductive: Moderate prostatomegaly. Other: Small amount of free pelvic fluid. Stranding and fluid along the right paracolic gutter. Musculoskeletal: Lumbar spondylosis and degenerative disc disease at L5-S1. IMPRESSION: 1. Very large amount of free intraperitoneal gas with additional scattered locules in the abdomen. Pneumatosis and distension of the cecum, ascending colon, and transverse colon. Appearance suspicious for colon perforation and  possible ischemia. Emergent surgical consultation recommended. 2. Extensive atherosclerosis with known abdominal aortic aneurysm. The infrarenal portion of the aneurysm has been treated with stent graft. 3. Trace bilateral pleural effusions. 4. Chronic reticulonodular opacities in the left upper lobe and left lower lobe favoring atypical infectious bronchiolitis. There are some new (from 3 months ago) nodules in the left lower lobe which are probably due to atypical infection, but which warrant surveillance imaging to exclude the unlikely possibility of new neoplastic nodules. 5. 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 6. Mild cardiomegaly. 7. Coronary, aortic arch, and branch vessel atherosclerotic vascular disease. Aortic valve calcification. 8. Hypodense pancreatic lesion measuring 2.7 by 1.9 cm, not well appreciated on prior CT abdomen from 11/26/2011. This is most likely to be an intraductal papillary mucinous neoplasm or a postinflammatory pancreatic cystic lesion, but enhancement characteristics are not assessed today. Although clearly the patient's current acute issues take precedence, follow up pancreatic protocol MRI with and without contrast would be recommended after complete resolution of the patient's acute situation in order to further characterize this lesion. 9. Severe atrophy of the right kidney. 10. Abdominal aortic aneurysm, with suprarenal portion measuring up to 5.0 cm transverse. Infrarenal abdominal aortic stent grafts extending into the common iliac arteries bilaterally. 11. Moderate prostatomegaly. 12. Lumbar spondylosis and degenerative disc disease. Critical Value/emergent results were called by telephone at the time of interpretation on 08/07/2019 at 2:28 pm to provider Dr. Marjean Donna , who verbally acknowledged these results. Electronically Signed   By: Van Clines M.D.   On: 08/07/2019 14:32   CT Head Wo Contrast  Result Date: 08/07/2019  CLINICAL DATA:  Poly  trauma, critical, head/cervical spine injury suspected. EXAM: CT HEAD WITHOUT CONTRAST CT CERVICAL SPINE WITHOUT CONTRAST TECHNIQUE: Multidetector CT imaging of the head and cervical spine was performed following the standard protocol without intravenous contrast. Multiplanar CT image reconstructions of the cervical spine were also generated. COMPARISON:  None. FINDINGS: CT HEAD FINDINGS Brain: Mild generalized age related parenchymal volume loss with commensurate dilatation of the ventricles and sulci. No mass, hemorrhage, edema or other evidence of acute parenchymal abnormality. No extra-axial hemorrhage. Vascular: Chronic calcified atherosclerotic changes of the large vessels at the skull base. No unexpected hyperdense vessel. Skull: Normal. Negative for fracture or focal lesion. Sinuses/Orbits: No acute finding. Other: None. CT CERVICAL SPINE FINDINGS Alignment: Mild scoliosis. Slight reversal of the normal cervical spine lordosis. No evidence of acute vertebral body subluxation. Skull base and vertebrae: No fracture line or displaced fracture fragment seen. Facet joints are normally aligned. Soft tissues and spinal canal: No prevertebral fluid or swelling. No visible canal hematoma. Disc levels: Mild degenerative spondylosis at the C6-7 level. No significant central canal stenosis at any level. Upper chest: RIGHT apical scarring, incompletely imaged, better demonstrated on earlier chest CT of 05/08/2019. No acute findings. Other: Bilateral carotid atherosclerosis. IMPRESSION: 1. No acute intracranial abnormality. No intracranial mass, hemorrhage or edema. No skull fracture. 2. No fracture or acute subluxation within the cervical spine. Mild scoliosis and slight reversal of the normal cervical spine lordosis is likely related to patient positioning or muscle spasm. 3. Carotid atherosclerosis. Electronically Signed   By: Franki Cabot M.D.   On: 08/07/2019 14:20   CT CHEST WO CONTRAST  Result Date:  08/07/2019 CLINICAL DATA:  Abdominal pain, shortness of breath, constipation. Syncope in the bathroom today. Tachypneic. EXAM: CT CHEST, ABDOMEN AND PELVIS WITHOUT CONTRAST TECHNIQUE: Multidetector CT imaging of the chest, abdomen and pelvis was performed following the standard protocol without IV contrast. COMPARISON:  Multiple exams, including CT chest 05/08/2019 and CT abdomen 11/26/2011 FINDINGS: CT CHEST FINDINGS Cardiovascular: AICD noted. Coronary, aortic arch, and branch vessel atherosclerotic vascular disease. Mild cardiomegaly. Aortic valve calcification. Mediastinum/Nodes: Mildly distended esophagus. No adenopathy identified. Lungs/Pleura: Trace bilateral pleural effusions. Chronic right upper lobe scarring with a nodular portion measuring 1.2 by 0.9 cm on image 36/4, previously the same on 05/08/2019. Right Airway thickening is present, suggesting bronchitis or reactive airways disease. Mild reticulonodular opacities in the left upper lobe and left lower lobe favoring atypical infectious bronchiolitis, similar to the prior exam in general. Bronchiectatic airway plugging in the left lower lobe as on image 70/4. There is some increase in peripheral nodularity in the left lower lobe including a pleural-based 1.3 by 0.9 cm nodule on image 90/4, probably inflammatory given that this was not present 3 months ago, but surveillance is probably warranted. No pneumothorax or pneumomediastinum. Musculoskeletal: No fracture or acute bony findings identified. CT ABDOMEN PELVIS FINDINGS Hepatobiliary: Unremarkable Pancreas: Hypodense pancreatic lesion measuring 2.7 by 1.9 cm on image 69/2, not well appreciated on prior CT the abdomen from 11/26/2011. This has fluid density internally and could be intraductal papillary mucinous neoplasm or a postinflammatory pancreatic cystic lesion. Spleen: Unremarkable Adrenals/Urinary Tract: Adrenal glands normal. Severe atrophy of the right kidney. 1.5 cm in diameter exophytic  fluid density lesion of the left kidney upper pole on image 50/5, likely a cyst. Similar fluid density 0.9 cm exophytic lesion from the left kidney lower pole, image 36/5. Vascular calcifications are present in the left renal hilum. Stomach/Bowel: Very large amount of free  intraperitoneal gas, with additional scattered locules in the abdomen. No dilated small bowel loops. Pneumatosis and distension of the cecum, ascending colon, and transverse colon. Sigmoid colon diverticulosis. Vascular/Lymphatic: Upper abdominal aorta 5.0 cm transverse. Aneurysmal dilatation extends down into the infrarenal abdominal aorta, with a saccular aneurysmal component just below the right renal artery. Infrarenal abdominal aortic stent grafts extending into the common iliac arteries bilaterally. Extensive atherosclerosis. Reproductive: Moderate prostatomegaly. Other: Small amount of free pelvic fluid. Stranding and fluid along the right paracolic gutter. Musculoskeletal: Lumbar spondylosis and degenerative disc disease at L5-S1. IMPRESSION: 1. Very large amount of free intraperitoneal gas with additional scattered locules in the abdomen. Pneumatosis and distension of the cecum, ascending colon, and transverse colon. Appearance suspicious for colon perforation and possible ischemia. Emergent surgical consultation recommended. 2. Extensive atherosclerosis with known abdominal aortic aneurysm. The infrarenal portion of the aneurysm has been treated with stent graft. 3. Trace bilateral pleural effusions. 4. Chronic reticulonodular opacities in the left upper lobe and left lower lobe favoring atypical infectious bronchiolitis. There are some new (from 3 months ago) nodules in the left lower lobe which are probably due to atypical infection, but which warrant surveillance imaging to exclude the unlikely possibility of new neoplastic nodules. 5. 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 6. Mild cardiomegaly. 7. Coronary, aortic  arch, and branch vessel atherosclerotic vascular disease. Aortic valve calcification. 8. Hypodense pancreatic lesion measuring 2.7 by 1.9 cm, not well appreciated on prior CT abdomen from 11/26/2011. This is most likely to be an intraductal papillary mucinous neoplasm or a postinflammatory pancreatic cystic lesion, but enhancement characteristics are not assessed today. Although clearly the patient's current acute issues take precedence, follow up pancreatic protocol MRI with and without contrast would be recommended after complete resolution of the patient's acute situation in order to further characterize this lesion. 9. Severe atrophy of the right kidney. 10. Abdominal aortic aneurysm, with suprarenal portion measuring up to 5.0 cm transverse. Infrarenal abdominal aortic stent grafts extending into the common iliac arteries bilaterally. 11. Moderate prostatomegaly. 12. Lumbar spondylosis and degenerative disc disease. Critical Value/emergent results were called by telephone at the time of interpretation on 08/07/2019 at 2:28 pm to provider Dr. Marjean Donna , who verbally acknowledged these results. Electronically Signed   By: Van Clines M.D.   On: 08/07/2019 14:32   CT Cervical Spine Wo Contrast  Result Date: 08/07/2019 CLINICAL DATA:  Poly trauma, critical, head/cervical spine injury suspected. EXAM: CT HEAD WITHOUT CONTRAST CT CERVICAL SPINE WITHOUT CONTRAST TECHNIQUE: Multidetector CT imaging of the head and cervical spine was performed following the standard protocol without intravenous contrast. Multiplanar CT image reconstructions of the cervical spine were also generated. COMPARISON:  None. FINDINGS: CT HEAD FINDINGS Brain: Mild generalized age related parenchymal volume loss with commensurate dilatation of the ventricles and sulci. No mass, hemorrhage, edema or other evidence of acute parenchymal abnormality. No extra-axial hemorrhage. Vascular: Chronic calcified atherosclerotic changes of the  large vessels at the skull base. No unexpected hyperdense vessel. Skull: Normal. Negative for fracture or focal lesion. Sinuses/Orbits: No acute finding. Other: None. CT CERVICAL SPINE FINDINGS Alignment: Mild scoliosis. Slight reversal of the normal cervical spine lordosis. No evidence of acute vertebral body subluxation. Skull base and vertebrae: No fracture line or displaced fracture fragment seen. Facet joints are normally aligned. Soft tissues and spinal canal: No prevertebral fluid or swelling. No visible canal hematoma. Disc levels: Mild degenerative spondylosis at the C6-7 level. No significant central canal stenosis at any level.  Upper chest: RIGHT apical scarring, incompletely imaged, better demonstrated on earlier chest CT of 05/08/2019. No acute findings. Other: Bilateral carotid atherosclerosis. IMPRESSION: 1. No acute intracranial abnormality. No intracranial mass, hemorrhage or edema. No skull fracture. 2. No fracture or acute subluxation within the cervical spine. Mild scoliosis and slight reversal of the normal cervical spine lordosis is likely related to patient positioning or muscle spasm. 3. Carotid atherosclerosis. Electronically Signed   By: Franki Cabot M.D.   On: 08/07/2019 14:20    Assessment and Plan: This is a 79 y.o. male with massive pneumoperitoneum of unclear etiology.  --Discussed with the patient that this is very critical scenario.  His WBC is 30, has significant lactic acidosis, and also has COVID.  Although his vital signs are stable right now, we cannot wait.  Discussed with him the options of proceeding with surgery in the form of exploratory laparotomy, most likely bowel resection and ostomy, vs the option of not doing anything.  Discussed that if nothing is done, the perforation will lead to worsening infection, sepsis, and likely death.  However, proceeding with surgery will be a long course, and there is no guarantee that he would survive this episode.  He wants to  proceed with surgery.  He understands the risks of bleeding, infection, injury to surrounding structures, possible death, and he's willing to proceed.  We may need to keep him intubated, or with an open abdomen and temporary closure device.  He will be admitted to ICU afterwards. --Discussed that with his diagnosis of COVID-19, I do not know how this will affect his course, and given his lung disease and other comorbidities, it might complicate things more. --His son was at bedside.   Melvyn Neth, MD [facility] Pg:  [phone]

## 2019-08-07 NOTE — Brief Op Note (Signed)
08/07/2019  9:06 PM  PATIENT:  Phillip Allison.  79 y.o. male  PRE-OPERATIVE DIAGNOSIS:  pneumoperitoneum  POST-OPERATIVE DIAGNOSIS:  pneumoperitoneum  PROCEDURE:  Subtotal colectomy with end ileostomy  SURGEON:  Surgeon(s) and Role:    * Tawania Daponte, Jacqulyn Bath, MD - Primary    Herbert Pun, MD - Assisting  ANESTHESIA:   general  EBL:  50 mL   BLOOD ADMINISTERED:none  DRAINS: (69 Fr) Blake drain(s) in the Left drain going to the pelvis, right drain going up to the liver   LOCAL MEDICATIONS USED:  BUPIVICAINE   SPECIMEN:  Source of Specimen:  subtotal colectomy  DISPOSITION OF SPECIMEN:  PATHOLOGY  COUNTS:  YES  DICTATION: .Dragon Dictation  PLAN OF CARE: Admit to inpatient   PATIENT DISPOSITION:  ICU - intubated and hemodynamically stable.   Delay start of Pharmacological VTE agent (>24hrs) due to surgical blood loss or risk of bleeding: yes

## 2019-08-07 NOTE — ED Triage Notes (Addendum)
Pt presents to ED via [facility] from home, per EMS call initially came out for unresponsive/CPR in progress, EMS reports that upon arrival, pt was moved to the floor by family, EMS reports no CPR done by EMS or FD. EMS reports pt w/ c/o abdominal pain and SOB and constipation x several days, reports today was in the bathroom and had syncopal episode. Pt with obvious abdominal distention upon arrival, tachypneic upon arrival with shallow breaths. Pt becomes alert when he is spoken to by staff.    Pt on chronic 2L due to COPD. Pt placed on 3L upon arrival to ED.

## 2019-08-08 ENCOUNTER — Inpatient Hospital Stay: Payer: Medicare Other

## 2019-08-08 DIAGNOSIS — A419 Sepsis, unspecified organism: Principal | ICD-10-CM

## 2019-08-08 LAB — COMPREHENSIVE METABOLIC PANEL
ALT: 148 U/L — ABNORMAL HIGH (ref 0–44)
AST: 225 U/L — ABNORMAL HIGH (ref 15–41)
Albumin: 2.9 g/dL — ABNORMAL LOW (ref 3.5–5.0)
Alkaline Phosphatase: 54 U/L (ref 38–126)
Anion gap: 9 (ref 5–15)
BUN: 63 mg/dL — ABNORMAL HIGH (ref 8–23)
CO2: 24 mmol/L (ref 22–32)
Calcium: 8.2 mg/dL — ABNORMAL LOW (ref 8.9–10.3)
Chloride: 107 mmol/L (ref 98–111)
Creatinine, Ser: 2.35 mg/dL — ABNORMAL HIGH (ref 0.61–1.24)
GFR calc Af Amer: 30 mL/min — ABNORMAL LOW (ref >60)
GFR calc non Af Amer: 26 mL/min — ABNORMAL LOW (ref >60)
Glucose, Bld: 127 mg/dL — ABNORMAL HIGH (ref 70–99)
Potassium: 3.9 mmol/L (ref 3.5–5.1)
Sodium: 140 mmol/L (ref 135–145)
Total Bilirubin: 0.9 mg/dL (ref 0.3–1.2)
Total Protein: 5.6 g/dL — ABNORMAL LOW (ref 6.5–8.1)

## 2019-08-08 LAB — PROCALCITONIN: Procalcitonin: 8.19 ng/mL

## 2019-08-08 LAB — C-REACTIVE PROTEIN
CRP: 21.7 mg/dL — ABNORMAL HIGH (ref <1.0)
CRP: 24.2 mg/dL — ABNORMAL HIGH (ref <1.0)

## 2019-08-08 LAB — HEMOGLOBIN A1C
Hgb A1c MFr Bld: 5.8 % — ABNORMAL HIGH (ref 4.8–5.6)
Mean Plasma Glucose: 119.76 mg/dL

## 2019-08-08 LAB — GLUCOSE, CAPILLARY
Glucose-Capillary: 101 mg/dL — ABNORMAL HIGH (ref 70–99)
Glucose-Capillary: 107 mg/dL — ABNORMAL HIGH (ref 70–99)
Glucose-Capillary: 109 mg/dL — ABNORMAL HIGH (ref 70–99)
Glucose-Capillary: 111 mg/dL — ABNORMAL HIGH (ref 70–99)
Glucose-Capillary: 120 mg/dL — ABNORMAL HIGH (ref 70–99)
Glucose-Capillary: 122 mg/dL — ABNORMAL HIGH (ref 70–99)
Glucose-Capillary: 126 mg/dL — ABNORMAL HIGH (ref 70–99)

## 2019-08-08 LAB — STREP PNEUMONIAE URINARY ANTIGEN: Strep Pneumo Urinary Antigen: NEGATIVE

## 2019-08-08 LAB — CBC
HCT: 38.2 % — ABNORMAL LOW (ref 39.0–52.0)
Hemoglobin: 12.1 g/dL — ABNORMAL LOW (ref 13.0–17.0)
MCH: 28.3 pg (ref 26.0–34.0)
MCHC: 31.7 g/dL (ref 30.0–36.0)
MCV: 89.5 fL (ref 80.0–100.0)
Platelets: 258 10*3/uL (ref 150–400)
RBC: 4.27 MIL/uL (ref 4.22–5.81)
RDW: 14.6 % (ref 11.5–15.5)
WBC: 26.3 10*3/uL — ABNORMAL HIGH (ref 4.0–10.5)
nRBC: 0 % (ref 0.0–0.2)

## 2019-08-08 LAB — FIBRIN DERIVATIVES D-DIMER (ARMC ONLY): Fibrin derivatives D-dimer (ARMC): 6505.04 ng/mL (FEU) — ABNORMAL HIGH (ref 0.00–499.00)

## 2019-08-08 LAB — MAGNESIUM: Magnesium: 2.3 mg/dL (ref 1.7–2.4)

## 2019-08-08 LAB — TROPONIN I (HIGH SENSITIVITY)
Troponin I (High Sensitivity): 66 ng/L — ABNORMAL HIGH (ref <18)
Troponin I (High Sensitivity): 97 ng/L — ABNORMAL HIGH (ref <18)
Troponin I (High Sensitivity): 118 ng/L (ref <18)
Troponin I (High Sensitivity): 122 ng/L (ref <18)

## 2019-08-08 LAB — FERRITIN: Ferritin: 359 ng/mL — ABNORMAL HIGH (ref 24–336)

## 2019-08-08 LAB — MRSA PCR SCREENING: MRSA by PCR: NEGATIVE

## 2019-08-08 MED ORDER — CHLORHEXIDINE GLUCONATE 0.12 % MT SOLN
OROMUCOSAL | Status: AC
  Administered 2019-08-08: 15 mL via OROMUCOSAL
  Filled 2019-08-08: qty 15

## 2019-08-08 MED ORDER — LACTATED RINGERS IV BOLUS
250.0000 mL | Freq: Once | INTRAVENOUS | Status: AC
  Administered 2019-08-08: 250 mL via INTRAVENOUS

## 2019-08-08 MED ORDER — ORAL CARE MOUTH RINSE
15.0000 mL | OROMUCOSAL | Status: DC
  Administered 2019-08-08 – 2019-08-10 (×19): 15 mL via OROMUCOSAL

## 2019-08-08 MED ORDER — SODIUM CHLORIDE 0.9 % IV SOLN
100.0000 mg | Freq: Every day | INTRAVENOUS | Status: AC
  Administered 2019-08-09 – 2019-08-12 (×4): 100 mg via INTRAVENOUS
  Filled 2019-08-08: qty 20
  Filled 2019-08-08: qty 100
  Filled 2019-08-08: qty 20
  Filled 2019-08-08: qty 100

## 2019-08-08 MED ORDER — NOREPINEPHRINE 16 MG/250ML-% IV SOLN
0.0000 ug/min | INTRAVENOUS | Status: DC
  Administered 2019-08-08: 6 ug/min via INTRAVENOUS
  Filled 2019-08-08: qty 250

## 2019-08-08 MED ORDER — CHLORHEXIDINE GLUCONATE 0.12% ORAL RINSE (MEDLINE KIT)
15.0000 mL | Freq: Two times a day (BID) | OROMUCOSAL | Status: DC
  Administered 2019-08-08 – 2019-08-11 (×6): 15 mL via OROMUCOSAL

## 2019-08-08 MED ORDER — DEXMEDETOMIDINE HCL IN NACL 400 MCG/100ML IV SOLN
0.4000 ug/kg/h | INTRAVENOUS | Status: DC
  Administered 2019-08-08: 0.6 ug/kg/h via INTRAVENOUS
  Administered 2019-08-08: 0.8 ug/kg/h via INTRAVENOUS
  Filled 2019-08-08 (×2): qty 100

## 2019-08-08 MED ORDER — CHLORHEXIDINE GLUCONATE CLOTH 2 % EX PADS
6.0000 | MEDICATED_PAD | Freq: Every day | CUTANEOUS | Status: DC
  Administered 2019-08-09 – 2019-08-14 (×6): 6 via TOPICAL

## 2019-08-08 MED ORDER — LACTATED RINGERS IV SOLN: INTRAVENOUS | Status: DC

## 2019-08-08 NOTE — Procedures (Signed)
Central Venous Catheter Insertion Procedure Note Phillip Allison [id number] 12/23/1940  Procedure: Insertion of Central Venous Catheter Indications: Assessment of intravascular volume, Drug and/or fluid administration and Frequent blood sampling  Procedure Details Consent: Risks of procedure as well as the alternatives and risks of each were explained to the (patient/caregiver).  Consent for procedure obtained. Time Out: Verified patient identification, verified procedure, site/side was marked, verified correct patient position, special equipment/implants available, medications/allergies/relevent history reviewed, required imaging and test results available.  Performed  Maximum sterile technique was used including antiseptics, cap, gloves, gown, hand hygiene, mask and sheet. Skin prep: Chlorhexidine; local anesthetic administered A antimicrobial bonded/coated triple lumen catheter was placed in the right internal jugular vein using the Seldinger technique.  Evaluation Blood flow good Complications: No apparent complications Patient did tolerate procedure well. Chest X-ray ordered to verify placement.  CXR: pending.    Procedure was performed using Ultrasound for direct visualization of cannulization of Right IJ.  Line was secured at the 16 cm mark.     Phillip Allison, AGACNP-BC [facility] Pager: [phone]  Phillip Allison 08/08/2019, 12:56 AM

## 2019-08-08 NOTE — Anesthesia Postprocedure Evaluation (Signed)
Anesthesia Post Note  Patient: Phillip Allison.  Procedure(s) Performed: EXPLORATORY LAPAROTOMY (N/A ) SMALL BOWEL RESECTION (N/A ) OSTOMY (N/A )  Patient location during evaluation: ICU Anesthesia Type: General Level of consciousness: sedated Pain management: pain level controlled Vital Signs Assessment: post-procedure vital signs reviewed and stable Respiratory status: patient on ventilator - see flowsheet for VS and patient remains intubated per anesthesia plan Cardiovascular status: stable Postop Assessment: no apparent nausea or vomiting Anesthetic complications: no Comments: Patient remains intubated, on pressors. No anesthetic complications evident.     Last Vitals:  Vitals:   08/08/19 0530 08/08/19 0600  BP: (!) 103/55 (!) 94/49  Pulse: 76 82  Resp: 20 20  Temp:    SpO2: 99% 96%    Last Pain:  Vitals:   08/08/19 0145  TempSrc: Axillary  PainSc:                  Arita Miss

## 2019-08-08 NOTE — Plan of Care (Signed)
Son [name] updated on POC, password set up.

## 2019-08-08 NOTE — Op Note (Signed)
Procedure Date:  08/08/2019  Pre-operative Diagnosis:  Pneumoperitoneum  Post-operative Diagnosis:  Pneumoperitoneum  Procedure:  Exploratory Laparotomy, Subtotal colectomy, creation of end ileostomy  Surgeon:  Melvyn Neth, MD  Assistant:  Herbert Pun, MD.  His assistance was critical given the complexity of the procedure.  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  There were multiple areas of patchy necrosis proximal to this all the way to the cecum.    Indications for Procedure:  This is a 79 y.o. male who presents with abdominal pain and workup revealing significant pneumoperitoneum.  The risks of bleeding, abscess or infection, injury to surrounding structures, and need for further procedures were all discussed with the patient and was willing to proceed.  Description of Procedure: The patient was correctly identified in the preoperative area and brought into the operating room.  The patient was placed supine with VTE prophylaxis in place.  Appropriate time-outs were performed.  Anesthesia was induced and the patient was intubated.  Foley catheter was placed.  Appropriate antibiotics were infused.  The abdomen was prepped and draped in a sterile fashion.  A midline incision was made and electrocautery was used to dissect down the subcutaneous tissue to the fascia.  The fascia was incised and extended superiorly and inferiorly.  The abdomen was explored revealing significant pneumoperitoneum but there was no significant purulence or feculent fluid.  The colon was significantly distended from cecum to distal transverse colon, and this required a controlled perforation in the mid-transverse colon to decompress the colon.  A purse string  suture was placed at the tinea in the mid transverse colon, and cautery was used to incise the colon, followed by placement of the pool sucker tip, and eventual tying of the purse string suture.  Once this was done, the abdomen was easier to explore.  The sigmoid colon was palpated and there was a short segment of severe stricture.  This was causing a large bowel obstruction.  There were multiple areas of patchy necrosis along the cecum and transverse colon.  Thus it was decided to perform a subtotal colectomy.  We started by finding the terminal ileum.  There were some interloop adhesions that were taken down with Metzenbaum scissors.  A window in the mesentery was created and the blue load stapler was used to transect the terminal ileum.  Then the right colon was mobilized off the white line of Toldt going distally towards the hepatic flexure.  LigaSure was used to dissect the mesentery of the right colon.  Once the hepatic flexure was reached, the omentum was separated from the proximal transverse colon and the colon was mobilized off the RUQ, with careful attention to not injure the duodenum.  The mesentery of the transverse colon was then taken down as well.  We proceeded in this fashion detaching omentum and taking down mesentery until reaching the splenic flexure.  At this point, we moved distally to the sigmoid colon, where a window in the mesentery distal to the stricture was created and a Contour blue load stapler was used to transect the rectosigmoid junction.  After this, the sigmoid was mobilized off the white line of Toldt as well, with careful attention to keep the left ureter intact.  LigaSure was used to take  down the mesentery of the sigmoid and descending colon.  Upon reaching the splenic flexure, the remaining omentum was fully released off the colon and the splenic flexure was taken down off its attachments and the specimen was removed off the field.  The specimen consisted of terminal ileum,  appendix, cecum, right colon, transverse colon, descending colon, and sigmoid colon.   The rectal stump was marked in two spots along the staple line using 2-0 Prolene.  There was a defect in the mesentery which was approximated using 3-0 Silk in order to prevent internal herniation.  The NG tube placed by anesthesia was confirmed in good position in the stomach.  The cavity was then thoroughly irrigated using 3 L of warm saline.  A 19 Fr. Blake drain was placed through the left abdominal wall going inferiorly along the left gutter to the pelvis and the rectal stump.  A second drain was placed through the right abdominal wall going superiorly along the right gutter to the liver.    A 2 cm circular incision was made in the right lower quadrant for our intended end ileostomy.  The skin and subcutaneous tissue was excised down to the fascia.  A cruciate incision was made through the anterior and posterior rectus sheath.  The terminal ileum was then passed through this opening, assuring no kinking in the bowel or its mesentery.  60 mL of Exparel solution mixed with 0.5% Bupivacaine with epi was infiltrated onto the peritoneum and fascia.  The midline fascia was then closed using #1 PDS suture x 2.  The incision was irrigated again and the skin was closed with staples.  After that, we proceeded to mature the end ileostomy in usual Brooke fashion using multiple 3-0 Vicryl sutures.  The skin was cleaned and the wound was dressed with Honeycomb dressing and the drains with 4x4 gauze and tegaderm.  Ostomy appliance was placed with good fitting around the end ileostomy.   The patient was then brought to the ICU, still intubated, for further management.  The patient tolerated the procedure well and all counts were correct at the end of the case.   Melvyn Neth, MD

## 2019-08-08 NOTE — Anesthesia Preprocedure Evaluation (Signed)
Anesthesia Evaluation  Patient identified by MRN, date of birth, ID band Patient awake    Reviewed: Allergy & Precautions, H&P , NPO status , Patient's Chart, lab work & pertinent test results, reviewed documented beta blocker date and time   History of Anesthesia Complications Negative for: history of anesthetic complications  Airway Mallampati: II  TM Distance: >3 FB Neck ROM: full    Dental  (+) Poor Dentition, Dental Advidsory Given, Missing, Chipped   Pulmonary shortness of breath, with exertion and Long-Term Oxygen Therapy, neg sleep apnea, COPD,  COPD inhaler and oxygen dependent, Recent URI  (currently COVID positive), former smoker,   Use of accessory muscles while breathing  breath sounds clear to auscultation + decreased breath sounds+ wheezing      Cardiovascular Exercise Tolerance: Good hypertension, (-) angina+ CAD, + Past MI and +CHF  (-) Cardiac Stents and (-) CABG + dysrhythmias Atrial Fibrillation + pacemaker + Valvular Problems/Murmurs  Rhythm:regular Rate:Normal     Neuro/Psych PSYCHIATRIC DISORDERS Depression negative neurological ROS     GI/Hepatic Neg liver ROS, GERD  ,  Endo/Other  negative endocrine ROS  Renal/GU CRFRenal disease  negative genitourinary   Musculoskeletal   Abdominal   Peds  Hematology negative hematology ROS (+)   Anesthesia Other Findings Past Medical History: No date: CHF (congestive heart failure) (HCC) No date: COPD (chronic obstructive pulmonary disease) (HCC) No date: Depression No date: Emphysema lung (HCC) No date: Hypercholesterolemia No date: Hypertension No date: Myocardial infarction ([place]) No date: On home oxygen therapy     Comment:  2 liters at night and as needed No date: Pneumonia     Comment:  in the past No date: Presence of permanent cardiac pacemaker No date: Shortness of breath dyspnea     Comment:  with exertion    Reproductive/Obstetrics negative OB ROS                             Anesthesia Physical Anesthesia Plan  ASA: IV and emergent  Anesthesia Plan: General   Post-op Pain Management:    Induction: Intravenous, Rapid sequence and Cricoid pressure planned  PONV Risk Score and Plan: 2 and Ondansetron, Dexamethasone and Treatment may vary due to age or medical condition  Airway Management Planned: Oral ETT  Additional Equipment:   Intra-op Plan:   Post-operative Plan: Post-operative intubation/ventilation  Informed Consent: I have reviewed the patients History and Physical, chart, labs and discussed the procedure including the risks, benefits and alternatives for the proposed anesthesia with the patient or authorized representative who has indicated his/her understanding and acceptance.     Dental Advisory Given  Plan Discussed with: Anesthesiologist, CRNA and Surgeon  Anesthesia Plan Comments:         Anesthesia Quick Evaluation

## 2019-08-08 NOTE — Progress Notes (Signed)
08/08/2019  Subjective: Patient is 1 Day Post-Op s/p exploratory laparotomy and subtotal colectomy with end ileostomy.  Patient started requiring Norepi after arrival to the ICU and a central line was placed.  He is on sedation with Precedex, and receiving Fentanyl for pain.  He also was started on Decadron and Remdesivir for COVID-19 treatment.    His WBC initially improved last night but is more elevated this morning, possibly from the steroids.  His ABG is good, and he's on minimal vent settings at this point.  NG with minimal output, and there is some dark stool output in ileostomy.  His urine output has decreased, and his creatinine remains elevated this morning.  Vital signs: Temp:  [96.1 F (35.6 C)-100 F (37.8 C)] 97.9 F (36.6 C) (02/14 1200) Pulse Rate:  [63-88] 68 (02/14 1200) Resp:  [15-25] 20 (02/14 1200) BP: (62-146)/(44-92) 133/67 (02/14 1200) SpO2:  [94 %-100 %] 99 % (02/14 1200) FiO2 (%):  [30 %-40 %] 30 % (02/14 1200) Weight:  [85.4 kg] 85.4 kg (02/14 0200)   Intake/Output: 02/13 0701 - 02/14 0700 In: 5476.9 [I.V.:2736.6; IV Piggyback:2740.4] Out: 560 [Urine:280; Drains:230; Blood:50] Last BM Date: 08/07/19  Physical Exam: Constitutional:  No acute distress Abdomen:  Soft, non-distended, does not appear to have pain at this point.  Drains with serosanguinous fluid.  Midline incision with staples clean, dry, intact.  RLQ ileostomy with healthy mucosa, patent, with dark dry stool in bag.  Labs:  Recent Labs    08/07/19 2201 08/08/19 0228  WBC 18.3* 26.3*  HGB 12.5* 12.1*  HCT 39.8 38.2*  PLT 142* 258   Recent Labs    08/07/19 2201 08/08/19 0119  NA 139 140  K 3.8 3.9  CL 104 107  CO2 21* 24  GLUCOSE 123* 127*  BUN 60* 63*  CREATININE 2.30* 2.35*  CALCIUM 8.4* 8.2*   Recent Labs    08/07/19 1220  LABPROT 16.9*  INR 1.4*    Imaging: DG Abd 1 View  Result Date: 08/07/2019 CLINICAL DATA:  Pneumoperitoneum, postop EXAM: ABDOMEN - 1 VIEW  COMPARISON:  08/07/2019 FINDINGS: Supine frontal view of the abdomen was performed. The pelvis is excluded by collimation. Midline skin staples are seen from laparotomy. Surgical drains project over the right upper and left lower abdomen. Enteric catheter tip projects over gastric body. There is relative paucity of bowel gas. Residual pneumoperitoneum consistent with recent surgical intervention. Endoluminal stent graft within the aorta. IMPRESSION: 1. Postsurgical changes from midline laparotomy. 2. Support devices as above. Electronically Signed   By: Randa Ngo M.D.   On: 08/07/2019 22:51   DG Chest Port 1 View  Result Date: 08/08/2019 CLINICAL DATA:  Central line placement EXAM: PORTABLE CHEST 1 VIEW COMPARISON:  08/07/2019 FINDINGS: Endotracheal tube at the thoracic inlet, 10 cm above the carina. Right IJ venous catheter terminates in the proximal SVC. Enteric tube courses into the stomach. Lungs are essentially clear.  No pleural effusion or pneumothorax. The heart is normal in size. Left subclavian pacemaker. Thoracic aortic atherosclerosis. IMPRESSION: Endotracheal tube terminates 10 cm above the carina. Right IJ venous catheter terminates in the proximal SVC. Enteric tube courses into the stomach. Electronically Signed   By: Julian Hy M.D.   On: 08/08/2019 01:14   DG Chest Port 1 View  Result Date: 08/07/2019 CLINICAL DATA:  Intubated, syncope, pneumoperitoneum EXAM: PORTABLE CHEST 1 VIEW COMPARISON:  08/07/2019 FINDINGS: 2 frontal views endotracheal tube overlying of the chest demonstrate tracheal air column tip at  level of thoracic inlet. Enteric catheter passes below diaphragm tip excluded by collimation. Dual lead pacer/AICD unchanged. Numerous cardiac leads overlie the chest. Cardiac silhouette is unremarkable. No airspace disease, effusion, or pneumothorax. No acute bony abnormalities. IMPRESSION: 1. Support devices as above. 2. No acute intrathoracic process. Electronically Signed    By: Randa Ngo M.D.   On: 08/07/2019 22:49    Assessment/Plan: This is a 79 y.o. male s/p subtotal colectomy with end ileostomy.  --Discussed with Dr. Lanney Gins -- he's on minimal vent settings and will start weaning sedation to see if he can pass an SBT and if so, try to extubate. --Wean Norepi as tolerated, currently decreased to 4 mcg --Continue gentle IV fluid hydration to help his AKI, having in mind his lower EF. --Continue IV Zosyn for his pneumoperitoneum. --DVT/GI prophylaxis --Continue COVID treatment.   Melvyn Neth, [facility]

## 2019-08-08 NOTE — Consult Note (Signed)
[facility] Cardiology Consultation Note  Patient ID: Phillip Davidian., MRN: [id number], DOB/AGE: 07-10-1940 79 y.o. Admit date: 08/07/2019   Date of Consult: 08/08/2019 Primary Physician: Jerrol Banana., MD Primary Cardiologist: Nehemiah Massed  Chief Complaint:  Chief Complaint  Patient presents with  . Shortness of Breath  . Abdominal Pain   Reason for Consult: Elevated troponin with cardiovascular disease  HPI: 79 y.o. male with none chronic systolic dysfunction congestive heart failure secondary to diffuse three-vessel coronary artery disease in the past and status post ICD placement with appropriate medication management including Entresto beta-blocker and furosemide stable as of last month.  The patient also has had chronic kidney disease with glomerular filtration rate of 26 on admission with an acute perforation of his colon due to colon stricture and other disease status post surgical intervention and more hemodynamically stable at this time.  He did have an elevation of troponin of 97 most consistent with demand ischemia and diffuse coronary disease rather than acute coronary syndrome.  White blood cell count has peaked at 26 with some sepsis as well but he is hemodynamically stable at this time.  Chest x-ray has shown no evidence of edema or congestive heart failure.  He does remain in normal sinus rhythm with intraventricular conduction defect and cannot rule out inferior and anterior infarct age undetermined but not significantly changed from EKGs in the past.  The patient is intubated at this time and currently ventilated with no other hemodynamic changes.  Telemetry has shown normal sinus rhythm and no evidence of rhythm disturbances requiring further intervention with no defibrillations necessary.  Due to his sepsis the patient has had discontinuation of medication management for coronary disease and heart failure until further recovery.  Past Medical History:   Diagnosis Date  . CHF (congestive heart failure) ([place])   . COPD (chronic obstructive pulmonary disease) ([place])   . Depression   . Emphysema lung ([place])   . Hypercholesterolemia   . Hypertension   . Myocardial infarction ([place])   . On home oxygen therapy    2 liters at night and as needed  . Pneumonia    in the past  . Presence of permanent cardiac pacemaker   . Shortness of breath dyspnea    with exertion      Surgical History:  Past Surgical History:  Procedure Laterality Date  . ABDOMINAL AORTIC ANEURYSM REPAIR    . ANGIOPLASTY     left lower extremity  . CATARACT EXTRACTION W/PHACO Right 12/18/2015   Procedure: CATARACT EXTRACTION PHACO AND INTRAOCULAR LENS PLACEMENT (IOC);  Surgeon: Estill Cotta, MD;  Location: [facility] ORS;  Service: Ophthalmology;  Laterality: Right;  [place] 01:58 AP% 23.5 CDE 50.91 fluid pack lot # [id number]  . IMPLANTABLE CARDIOVERTER DEFIBRILLATOR (ICD) GENERATOR CHANGE N/A 05/12/2019   Procedure: ICD GENERATOR CHANGE;  Surgeon: Isaias Cowman, MD;  Location: [facility] ORS;  Service: Cardiovascular;  Laterality: N/A;  . INSERT / REPLACE / REMOVE PACEMAKER    . PACEMAKER PLACEMENT    . TONSILLECTOMY       Home Meds: Prior to Admission medications   Medication Sig Start Date End Date Taking? Authorizing Provider  albuterol (PROVENTIL) (2.5 MG/3ML) 0.083% nebulizer solution Take 3 mLs (2.5 mg total) by nebulization every 6 (six) hours as needed for wheezing or shortness of breath. 07/05/19  Yes Jerrol Banana., MD  amLODipine (NORVASC) 5 MG tablet Take 1 tablet (5 mg total) by mouth daily. 10/10/17  Yes Fritzi Mandes, MD  aspirin 81 MG tablet Take 81 mg by mouth at bedtime.  08/22/11  Yes [provider]  carvedilol (COREG) 6.25 MG tablet Take 1 tablet (6.25 mg total) by mouth 2 (two) times daily with a meal. 05/12/19  Yes Ivor Costa, MD  ENTRESTO 24-26 MG Take 1 tablet by mouth 2 (two) times daily. 05/05/19  Yes [provider]   furosemide (LASIX) 20 MG tablet Take 20 mg by mouth 2 (two) times daily. 05/25/19  Yes [provider]  guaiFENesin (MUCINEX) 600 MG 12 hr tablet Take 1 tablet (600 mg total) by mouth 2 (two) times daily. 05/12/19  Yes Ivor Costa, MD  pravastatin (PRAVACHOL) 40 MG tablet Take 1 tablet (40 mg total) by mouth at bedtime. 10/12/18  Yes Jerrol Banana., MD  sertraline (ZOLOFT) 50 MG tablet TAKE 1 TABLET(50 MG) BY MOUTH DAILY Patient taking differently: Take 50 mg by mouth daily.  08/03/19  Yes Jerrol Banana., MD    Inpatient Medications:  . chlorhexidine gluconate (MEDLINE KIT)  15 mL Mouth Rinse BID  . Chlorhexidine Gluconate Cloth  6 each Topical Daily  . dexamethasone (DECADRON) injection  6 mg Intravenous Q24H  . fentaNYL (SUBLIMAZE) injection  25 mcg Intravenous Once  . heparin  5,000 Units Subcutaneous Q8H  . insulin aspart  0-15 Units Subcutaneous Q4H  . mouth rinse  15 mL Mouth Rinse 10 times per day  . pantoprazole (PROTONIX) IV  40 mg Intravenous QHS   . sodium chloride 100 mL/hr at 08/07/19 2147  . dexmedetomidine (PRECEDEX) IV infusion 0.6 mcg/kg/hr (08/08/19 0551)  . fentaNYL infusion INTRAVENOUS 250 mcg/hr (08/08/19 0551)  . norepinephrine (LEVOPHED) Adult infusion Stopped (08/08/19 0510)  . piperacillin-tazobactam (ZOSYN)  IV 3.375 g (08/08/19 0255)  . [START ON 08/09/2019] remdesivir 100 mg in NS 100 mL      Allergies: No Known Allergies  Social History   Socioeconomic History  . Marital status: Widowed    Spouse name: Not on file  . Number of children: 3  . Years of education: Not on file  . Highest education level: Associate degree: occupational, Hotel manager, or vocational program  Occupational History  . Occupation: retired  Tobacco Use  . Smoking status: Former Smoker    Packs/day: 0.75    Years: 54.00    Pack years: 40.50    Types: Cigarettes    Quit date: 2014    Years since quitting: 7.1  . Smokeless tobacco: Never Used  . Tobacco  comment: 2014?  Substance and Sexual Activity  . Alcohol use: No  . Drug use: No  . Sexual activity: Not Currently  Other Topics Concern  . Not on file  Social History Narrative  . Not on file   Social Determinants of Health   Financial Resource Strain:   . Difficulty of Paying Living Expenses: Not on file  Food Insecurity:   . Worried About Charity fundraiser in the Last Year: Not on file  . Ran Out of Food in the Last Year: Not on file  Transportation Needs:   . Lack of Transportation (Medical): Not on file  . Lack of Transportation (Non-Medical): Not on file  Physical Activity: Inactive  . Days of Exercise per Week: 0 days  . Minutes of Exercise per Session: 0 min  Stress:   . Feeling of Stress : Not on file  Social Connections: Unknown  . Frequency of Communication with Friends and Family: Patient refused  . Frequency of Social  Gatherings with Friends and Family: Patient refused  . Attends Religious Services: Patient refused  . Active Member of Clubs or Organizations: Patient refused  . Attends Archivist Meetings: Patient refused  . Marital Status: Patient refused  Intimate Partner Violence: Unknown  . Fear of Current or Ex-Partner: Patient refused  . Emotionally Abused: Patient refused  . Physically Abused: Patient refused  . Sexually Abused: Patient refused     Family History  Problem Relation Age of Onset  . Psoriasis Mother   . Heart attack Father      Review of Systems Patient is intubated and respirated Labs: No results for input(s): CKTOTAL, CKMB, TROPONINI in the last 72 hours. Lab Results  Component Value Date   WBC 26.3 (H) 08/08/2019   HGB 12.1 (L) 08/08/2019   HCT 38.2 (L) 08/08/2019   MCV 89.5 08/08/2019   PLT 258 08/08/2019    Recent Labs  Lab 08/08/19 0119  NA 140  K 3.9  CL 107  CO2 24  BUN 63*  CREATININE 2.35*  CALCIUM 8.2*  PROT 5.6*  BILITOT 0.9  ALKPHOS 54  ALT 148*  AST 225*  GLUCOSE 127*   Lab Results   Component Value Date   CHOL 135 07/01/2017   HDL 42 07/01/2017   LDLCALC 76 07/01/2017   TRIG 84 07/01/2017   No results found for: DDIMER  Radiology/Studies:  CT ABDOMEN PELVIS WO CONTRAST  Result Date: 08/07/2019 CLINICAL DATA:  Abdominal pain, shortness of breath, constipation. Syncope in the bathroom today. Tachypneic. EXAM: CT CHEST, ABDOMEN AND PELVIS WITHOUT CONTRAST TECHNIQUE: Multidetector CT imaging of the chest, abdomen and pelvis was performed following the standard protocol without IV contrast. COMPARISON:  Multiple exams, including CT chest 05/08/2019 and CT abdomen 11/26/2011 FINDINGS: CT CHEST FINDINGS Cardiovascular: AICD noted. Coronary, aortic arch, and branch vessel atherosclerotic vascular disease. Mild cardiomegaly. Aortic valve calcification. Mediastinum/Nodes: Mildly distended esophagus. No adenopathy identified. Lungs/Pleura: Trace bilateral pleural effusions. Chronic right upper lobe scarring with a nodular portion measuring 1.2 by 0.9 cm on image 36/4, previously the same on 05/08/2019. Right Airway thickening is present, suggesting bronchitis or reactive airways disease. Mild reticulonodular opacities in the left upper lobe and left lower lobe favoring atypical infectious bronchiolitis, similar to the prior exam in general. Bronchiectatic airway plugging in the left lower lobe as on image 70/4. There is some increase in peripheral nodularity in the left lower lobe including a pleural-based 1.3 by 0.9 cm nodule on image 90/4, probably inflammatory given that this was not present 3 months ago, but surveillance is probably warranted. No pneumothorax or pneumomediastinum. Musculoskeletal: No fracture or acute bony findings identified. CT ABDOMEN PELVIS FINDINGS Hepatobiliary: Unremarkable Pancreas: Hypodense pancreatic lesion measuring 2.7 by 1.9 cm on image 69/2, not well appreciated on prior CT the abdomen from 11/26/2011. This has fluid density internally and could be  intraductal papillary mucinous neoplasm or a postinflammatory pancreatic cystic lesion. Spleen: Unremarkable Adrenals/Urinary Tract: Adrenal glands normal. Severe atrophy of the right kidney. 1.5 cm in diameter exophytic fluid density lesion of the left kidney upper pole on image 50/5, likely a cyst. Similar fluid density 0.9 cm exophytic lesion from the left kidney lower pole, image 36/5. Vascular calcifications are present in the left renal hilum. Stomach/Bowel: Very large amount of free intraperitoneal gas, with additional scattered locules in the abdomen. No dilated small bowel loops. Pneumatosis and distension of the cecum, ascending colon, and transverse colon. Sigmoid colon diverticulosis. Vascular/Lymphatic: Upper abdominal aorta 5.0 cm transverse.  Aneurysmal dilatation extends down into the infrarenal abdominal aorta, with a saccular aneurysmal component just below the right renal artery. Infrarenal abdominal aortic stent grafts extending into the common iliac arteries bilaterally. Extensive atherosclerosis. Reproductive: Moderate prostatomegaly. Other: Small amount of free pelvic fluid. Stranding and fluid along the right paracolic gutter. Musculoskeletal: Lumbar spondylosis and degenerative disc disease at L5-S1. IMPRESSION: 1. Very large amount of free intraperitoneal gas with additional scattered locules in the abdomen. Pneumatosis and distension of the cecum, ascending colon, and transverse colon. Appearance suspicious for colon perforation and possible ischemia. Emergent surgical consultation recommended. 2. Extensive atherosclerosis with known abdominal aortic aneurysm. The infrarenal portion of the aneurysm has been treated with stent graft. 3. Trace bilateral pleural effusions. 4. Chronic reticulonodular opacities in the left upper lobe and left lower lobe favoring atypical infectious bronchiolitis. There are some new (from 3 months ago) nodules in the left lower lobe which are probably due to  atypical infection, but which warrant surveillance imaging to exclude the unlikely possibility of new neoplastic nodules. 5. 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 6. Mild cardiomegaly. 7. Coronary, aortic arch, and branch vessel atherosclerotic vascular disease. Aortic valve calcification. 8. Hypodense pancreatic lesion measuring 2.7 by 1.9 cm, not well appreciated on prior CT abdomen from 11/26/2011. This is most likely to be an intraductal papillary mucinous neoplasm or a postinflammatory pancreatic cystic lesion, but enhancement characteristics are not assessed today. Although clearly the patient's current acute issues take precedence, follow up pancreatic protocol MRI with and without contrast would be recommended after complete resolution of the patient's acute situation in order to further characterize this lesion. 9. Severe atrophy of the right kidney. 10. Abdominal aortic aneurysm, with suprarenal portion measuring up to 5.0 cm transverse. Infrarenal abdominal aortic stent grafts extending into the common iliac arteries bilaterally. 11. Moderate prostatomegaly. 12. Lumbar spondylosis and degenerative disc disease. Critical Value/emergent results were called by telephone at the time of interpretation on 08/07/2019 at 2:28 pm to provider Dr. Marjean Donna , who verbally acknowledged these results. Electronically Signed   By: Van Clines M.D.   On: 08/07/2019 14:32   DG Abd 1 View  Result Date: 08/07/2019 CLINICAL DATA:  Pneumoperitoneum, postop EXAM: ABDOMEN - 1 VIEW COMPARISON:  08/07/2019 FINDINGS: Supine frontal view of the abdomen was performed. The pelvis is excluded by collimation. Midline skin staples are seen from laparotomy. Surgical drains project over the right upper and left lower abdomen. Enteric catheter tip projects over gastric body. There is relative paucity of bowel gas. Residual pneumoperitoneum consistent with recent surgical intervention. Endoluminal stent graft  within the aorta. IMPRESSION: 1. Postsurgical changes from midline laparotomy. 2. Support devices as above. Electronically Signed   By: Randa Ngo M.D.   On: 08/07/2019 22:51   CT Head Wo Contrast  Result Date: 08/07/2019 CLINICAL DATA:  Poly trauma, critical, head/cervical spine injury suspected. EXAM: CT HEAD WITHOUT CONTRAST CT CERVICAL SPINE WITHOUT CONTRAST TECHNIQUE: Multidetector CT imaging of the head and cervical spine was performed following the standard protocol without intravenous contrast. Multiplanar CT image reconstructions of the cervical spine were also generated. COMPARISON:  None. FINDINGS: CT HEAD FINDINGS Brain: Mild generalized age related parenchymal volume loss with commensurate dilatation of the ventricles and sulci. No mass, hemorrhage, edema or other evidence of acute parenchymal abnormality. No extra-axial hemorrhage. Vascular: Chronic calcified atherosclerotic changes of the large vessels at the skull base. No unexpected hyperdense vessel. Skull: Normal. Negative for fracture or focal lesion. Sinuses/Orbits: No acute  finding. Other: None. CT CERVICAL SPINE FINDINGS Alignment: Mild scoliosis. Slight reversal of the normal cervical spine lordosis. No evidence of acute vertebral body subluxation. Skull base and vertebrae: No fracture line or displaced fracture fragment seen. Facet joints are normally aligned. Soft tissues and spinal canal: No prevertebral fluid or swelling. No visible canal hematoma. Disc levels: Mild degenerative spondylosis at the C6-7 level. No significant central canal stenosis at any level. Upper chest: RIGHT apical scarring, incompletely imaged, better demonstrated on earlier chest CT of 05/08/2019. No acute findings. Other: Bilateral carotid atherosclerosis. IMPRESSION: 1. No acute intracranial abnormality. No intracranial mass, hemorrhage or edema. No skull fracture. 2. No fracture or acute subluxation within the cervical spine. Mild scoliosis and slight  reversal of the normal cervical spine lordosis is likely related to patient positioning or muscle spasm. 3. Carotid atherosclerosis. Electronically Signed   By: Franki Cabot M.D.   On: 08/07/2019 14:20   CT CHEST WO CONTRAST  Result Date: 08/07/2019 CLINICAL DATA:  Abdominal pain, shortness of breath, constipation. Syncope in the bathroom today. Tachypneic. EXAM: CT CHEST, ABDOMEN AND PELVIS WITHOUT CONTRAST TECHNIQUE: Multidetector CT imaging of the chest, abdomen and pelvis was performed following the standard protocol without IV contrast. COMPARISON:  Multiple exams, including CT chest 05/08/2019 and CT abdomen 11/26/2011 FINDINGS: CT CHEST FINDINGS Cardiovascular: AICD noted. Coronary, aortic arch, and branch vessel atherosclerotic vascular disease. Mild cardiomegaly. Aortic valve calcification. Mediastinum/Nodes: Mildly distended esophagus. No adenopathy identified. Lungs/Pleura: Trace bilateral pleural effusions. Chronic right upper lobe scarring with a nodular portion measuring 1.2 by 0.9 cm on image 36/4, previously the same on 05/08/2019. Right Airway thickening is present, suggesting bronchitis or reactive airways disease. Mild reticulonodular opacities in the left upper lobe and left lower lobe favoring atypical infectious bronchiolitis, similar to the prior exam in general. Bronchiectatic airway plugging in the left lower lobe as on image 70/4. There is some increase in peripheral nodularity in the left lower lobe including a pleural-based 1.3 by 0.9 cm nodule on image 90/4, probably inflammatory given that this was not present 3 months ago, but surveillance is probably warranted. No pneumothorax or pneumomediastinum. Musculoskeletal: No fracture or acute bony findings identified. CT ABDOMEN PELVIS FINDINGS Hepatobiliary: Unremarkable Pancreas: Hypodense pancreatic lesion measuring 2.7 by 1.9 cm on image 69/2, not well appreciated on prior CT the abdomen from 11/26/2011. This has fluid density  internally and could be intraductal papillary mucinous neoplasm or a postinflammatory pancreatic cystic lesion. Spleen: Unremarkable Adrenals/Urinary Tract: Adrenal glands normal. Severe atrophy of the right kidney. 1.5 cm in diameter exophytic fluid density lesion of the left kidney upper pole on image 50/5, likely a cyst. Similar fluid density 0.9 cm exophytic lesion from the left kidney lower pole, image 36/5. Vascular calcifications are present in the left renal hilum. Stomach/Bowel: Very large amount of free intraperitoneal gas, with additional scattered locules in the abdomen. No dilated small bowel loops. Pneumatosis and distension of the cecum, ascending colon, and transverse colon. Sigmoid colon diverticulosis. Vascular/Lymphatic: Upper abdominal aorta 5.0 cm transverse. Aneurysmal dilatation extends down into the infrarenal abdominal aorta, with a saccular aneurysmal component just below the right renal artery. Infrarenal abdominal aortic stent grafts extending into the common iliac arteries bilaterally. Extensive atherosclerosis. Reproductive: Moderate prostatomegaly. Other: Small amount of free pelvic fluid. Stranding and fluid along the right paracolic gutter. Musculoskeletal: Lumbar spondylosis and degenerative disc disease at L5-S1. IMPRESSION: 1. Very large amount of free intraperitoneal gas with additional scattered locules in the abdomen. Pneumatosis and distension of  the cecum, ascending colon, and transverse colon. Appearance suspicious for colon perforation and possible ischemia. Emergent surgical consultation recommended. 2. Extensive atherosclerosis with known abdominal aortic aneurysm. The infrarenal portion of the aneurysm has been treated with stent graft. 3. Trace bilateral pleural effusions. 4. Chronic reticulonodular opacities in the left upper lobe and left lower lobe favoring atypical infectious bronchiolitis. There are some new (from 3 months ago) nodules in the left lower lobe which  are probably due to atypical infection, but which warrant surveillance imaging to exclude the unlikely possibility of new neoplastic nodules. 5. 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 6. Mild cardiomegaly. 7. Coronary, aortic arch, and branch vessel atherosclerotic vascular disease. Aortic valve calcification. 8. Hypodense pancreatic lesion measuring 2.7 by 1.9 cm, not well appreciated on prior CT abdomen from 11/26/2011. This is most likely to be an intraductal papillary mucinous neoplasm or a postinflammatory pancreatic cystic lesion, but enhancement characteristics are not assessed today. Although clearly the patient's current acute issues take precedence, follow up pancreatic protocol MRI with and without contrast would be recommended after complete resolution of the patient's acute situation in order to further characterize this lesion. 9. Severe atrophy of the right kidney. 10. Abdominal aortic aneurysm, with suprarenal portion measuring up to 5.0 cm transverse. Infrarenal abdominal aortic stent grafts extending into the common iliac arteries bilaterally. 11. Moderate prostatomegaly. 12. Lumbar spondylosis and degenerative disc disease. Critical Value/emergent results were called by telephone at the time of interpretation on 08/07/2019 at 2:28 pm to provider Dr. Marjean Donna , who verbally acknowledged these results. Electronically Signed   By: Van Clines M.D.   On: 08/07/2019 14:32   CT Cervical Spine Wo Contrast  Result Date: 08/07/2019 CLINICAL DATA:  Poly trauma, critical, head/cervical spine injury suspected. EXAM: CT HEAD WITHOUT CONTRAST CT CERVICAL SPINE WITHOUT CONTRAST TECHNIQUE: Multidetector CT imaging of the head and cervical spine was performed following the standard protocol without intravenous contrast. Multiplanar CT image reconstructions of the cervical spine were also generated. COMPARISON:  None. FINDINGS: CT HEAD FINDINGS Brain: Mild generalized age related  parenchymal volume loss with commensurate dilatation of the ventricles and sulci. No mass, hemorrhage, edema or other evidence of acute parenchymal abnormality. No extra-axial hemorrhage. Vascular: Chronic calcified atherosclerotic changes of the large vessels at the skull base. No unexpected hyperdense vessel. Skull: Normal. Negative for fracture or focal lesion. Sinuses/Orbits: No acute finding. Other: None. CT CERVICAL SPINE FINDINGS Alignment: Mild scoliosis. Slight reversal of the normal cervical spine lordosis. No evidence of acute vertebral body subluxation. Skull base and vertebrae: No fracture line or displaced fracture fragment seen. Facet joints are normally aligned. Soft tissues and spinal canal: No prevertebral fluid or swelling. No visible canal hematoma. Disc levels: Mild degenerative spondylosis at the C6-7 level. No significant central canal stenosis at any level. Upper chest: RIGHT apical scarring, incompletely imaged, better demonstrated on earlier chest CT of 05/08/2019. No acute findings. Other: Bilateral carotid atherosclerosis. IMPRESSION: 1. No acute intracranial abnormality. No intracranial mass, hemorrhage or edema. No skull fracture. 2. No fracture or acute subluxation within the cervical spine. Mild scoliosis and slight reversal of the normal cervical spine lordosis is likely related to patient positioning or muscle spasm. 3. Carotid atherosclerosis. Electronically Signed   By: Franki Cabot M.D.   On: 08/07/2019 14:20   DG Chest Port 1 View  Result Date: 08/08/2019 CLINICAL DATA:  Central line placement EXAM: PORTABLE CHEST 1 VIEW COMPARISON:  08/07/2019 FINDINGS: Endotracheal tube at the thoracic inlet,  10 cm above the carina. Right IJ venous catheter terminates in the proximal SVC. Enteric tube courses into the stomach. Lungs are essentially clear.  No pleural effusion or pneumothorax. The heart is normal in size. Left subclavian pacemaker. Thoracic aortic atherosclerosis.  IMPRESSION: Endotracheal tube terminates 10 cm above the carina. Right IJ venous catheter terminates in the proximal SVC. Enteric tube courses into the stomach. Electronically Signed   By: Julian Hy M.D.   On: 08/08/2019 01:14   DG Chest Port 1 View  Result Date: 08/07/2019 CLINICAL DATA:  Intubated, syncope, pneumoperitoneum EXAM: PORTABLE CHEST 1 VIEW COMPARISON:  08/07/2019 FINDINGS: 2 frontal views endotracheal tube overlying of the chest demonstrate tracheal air column tip at level of thoracic inlet. Enteric catheter passes below diaphragm tip excluded by collimation. Dual lead pacer/AICD unchanged. Numerous cardiac leads overlie the chest. Cardiac silhouette is unremarkable. No airspace disease, effusion, or pneumothorax. No acute bony abnormalities. IMPRESSION: 1. Support devices as above. 2. No acute intrathoracic process. Electronically Signed   By: Randa Ngo M.D.   On: 08/07/2019 22:49    EKG: Normal sinus rhythm with interventricular conduction defect cannot rule out inferior anterior infarct age undetermined unchanged from before  Weights: Filed Weights   08/07/19 1233 08/08/19 0200  Weight: 79.4 kg 85.4 kg     Physical Exam: Blood pressure (!) 94/49, pulse 82, temperature (!) 96.1 F (35.6 C), temperature source Axillary, resp. rate 20, height '5\' 7"'$  (1.702 m), weight 85.4 kg, SpO2 96 %. Body mass index is 29.49 kg/m. Patient is intubated currently respirated and as above is hemodynamically stable. Further exam as per critical care    Assessment: 79 year old male with chronic systolic dysfunction congestive heart failure status post ICD placement with coronary atherosclerosis chronic kidney disease peripheral vascular disease status post colonic perforation and surgery without evidence of myocardial infarction acute coronary syndrome or congestive heart failure with minimal elevation of troponin consistent with demand ischemia likely from sepsis now clinically and  hemodynamically stable  Plan: 1.  Continue post surgical intervention and treatment as necessary for perforation and sepsis 2.  Abstain from beta-blocker and Entresto due to hypotension and sepsis and would to potentially reinstate at a later date when recovered 3.  Deep venous thrombosis prophylaxis but no further advanced heparin use due to no evidence of acute coronary syndrome 4.  No further cardiac diagnostics necessary at this time 5.  Further treatment options after further recovery from sepsis and surgery  Signed, Corey Skains M.D. [facility] 08/08/2019, 7:37 AM

## 2019-08-08 NOTE — Progress Notes (Signed)
Pharmacy Antibiotic Note  Phillip Allison. is a 79 y.o. male admitted on 08/07/2019 with intraabdominal infection. Patient admited through the emergency department with distended abdomen and 2 day history of abdominal pain. Patient Positive for Coronavirus on 2/13; per H&P patient is asymptomatic. Pharmacy has been consulted for Zosyn dosing. Patient received cefepime, metronidazole, and vancomycin in ED on 2/13.   Plan: Zosyn EI  3.375g IV Q8hr.   Will obtain serum creatinine with am labs and adjust as necessary.   Height: 5\' 7"  (170.2 cm) Weight: 188 lb 4.4 oz (85.4 kg) IBW/kg (Calculated) : 66.1  Temp (24hrs), Avg:96.6 F (35.9 C), Min:96.1 F (35.6 C), Max:97.9 F (36.6 C)  Recent Labs  Lab 08/07/19 1220 08/07/19 2201 08/08/19 0119 08/08/19 0228  WBC 30.5* 18.3*  --  26.3*  CREATININE 2.49* 2.30* 2.35*  --   LATICACIDVEN 6.4* 1.8  --   --     Estimated Creatinine Clearance: 27.5 mL/min (A) (by C-G formula based on SCr of 2.35 mg/dL (H)).    No Known Allergies  Antimicrobials this admission: Vancomycin 2/13 x 1 Cefepime 2/13 x 1 Metronidazole 2/13 x 1 Zosyn 2/13 >>   Dose adjustments this admission: N/A  Microbiology results: 2/13 BCx: pending  2/13 SARS Coronavirus 2: positive  2/13 Influenza A/B: negative   Thank you for allowing pharmacy to be a part of this patient's care.  Oswald Hillock, PharmD, BCPS 08/08/2019 7:30 AM

## 2019-08-08 NOTE — Consult Note (Signed)
PULMONARY / CRITICAL CARE MEDICINE   Name: Phillip Allison. MRN: [id number] DOB: [birth year]    ADMISSION DATE:  08/07/2019 CONSULTATION DATE:  08/07/2019  REFERRING MD:  Dr. Hampton Abbot  CHIEF COMPLAINT:  Abdominal pain  BRIEF DISCUSSION: [name] with PMH of COPD on 2L [place], HFrEF, CAD, and CKD III admitted 08/07/19 with severe pneumoperitoneum and perforated viscus where he was taken for emergency exploratory Laparotomy.  He required subtotal colectomy with end ileostomy.  Also found to be positive for COVID-19.  Returns to ICU from OR, remains intubated. After arrival to ICU noted to be hypotensive concerning for developing septic shock, and possible cardiogenic shock due to questionable NSTEMI.      EVENTS:  08/08/19- weaning ventilator support, awakening trial, hemodynamics improved. SBT today.  Discussed with surgery -Dr Hampton Abbot - no additional surgical intervention    PAST MEDICAL HISTORY :  He  has a past medical history of CHF (congestive heart failure) ([place]), COPD (chronic obstructive pulmonary disease) ([place]), Depression, Emphysema lung ([place]), Hypercholesterolemia, Hypertension, Myocardial infarction ([place]), On home oxygen therapy, Pneumonia, Presence of permanent cardiac pacemaker, and Shortness of breath dyspnea.  PAST SURGICAL HISTORY: He  has a past surgical history that includes Angioplasty; pacemaker placement; Tonsillectomy; Insert / replace / remove pacemaker; Abdominal aortic aneurysm repair; Cataract extraction w/PHACO (Right, 12/18/2015); and implantable cardioverter defibrillator (icd) generator change (N/A, 05/12/2019).  No Known Allergies  No current facility-administered medications on file prior to encounter.   Current Outpatient Medications on File Prior to Encounter  Medication Sig  . albuterol (PROVENTIL) (2.5 MG/3ML) 0.083% nebulizer solution Take 3 mLs (2.5 mg total) by nebulization every 6 (six) hours as needed for wheezing or shortness of breath.    Marland Kitchen amLODipine (NORVASC) 5 MG tablet Take 1 tablet (5 mg total) by mouth daily.  Marland Kitchen aspirin 81 MG tablet Take 81 mg by mouth at bedtime.   . carvedilol (COREG) 6.25 MG tablet Take 1 tablet (6.25 mg total) by mouth 2 (two) times daily with a meal.  . ENTRESTO 24-26 MG Take 1 tablet by mouth 2 (two) times daily.  . furosemide (LASIX) 20 MG tablet Take 20 mg by mouth 2 (two) times daily.  Marland Kitchen guaiFENesin (MUCINEX) 600 MG 12 hr tablet Take 1 tablet (600 mg total) by mouth 2 (two) times daily.  . pravastatin (PRAVACHOL) 40 MG tablet Take 1 tablet (40 mg total) by mouth at bedtime.  . sertraline (ZOLOFT) 50 MG tablet TAKE 1 TABLET(50 MG) BY MOUTH DAILY (Patient taking differently: Take 50 mg by mouth daily. )    FAMILY HISTORY:  His He indicated that his mother is deceased. He indicated that his father is deceased.   SOCIAL HISTORY: He  reports that he quit smoking about 7 years ago. His smoking use included cigarettes. He has a 40.50 pack-year smoking history. He has never used smokeless tobacco. He reports that he does not drink alcohol or use drugs.    REVIEW OF SYSTEMS:   Unable to assess due to intubation and critical illness  SUBJECTIVE:  Unable to assess due to intubation and critical illness  VITAL SIGNS: BP (!) 108/52   Pulse 73   Temp 100 F (37.8 C) (Axillary)   Resp 20   Ht 5\' 7"  (1.702 m)   Wt 85.4 kg   SpO2 100%   BMI 29.49 kg/m   HEMODYNAMICS: CVP:  [6 mmHg-12 mmHg] 10 mmHg  VENTILATOR SETTINGS: Vent Mode: PRVC FiO2 (%):  [30 %-40 %] 30 %  Set Rate:  [18 bmp-20 bmp] 20 bmp Vt Set:  [500 mL] 500 mL PEEP:  [5 cmH20] 5 cmH20 Plateau Pressure:  [15 cmH20-22 cmH20] 22 cmH20  INTAKE / OUTPUT: I/O last 3 completed shifts: In: 5476.9 [I.V.:2736.6; IV Piggyback:2740.4] Out: 560 [Urine:280; Drains:230; Blood:50]  PHYSICAL EXAMINATION: General: Acute on chronically ill-appearing male, laying in bed, intubated and sedated, no acute distress Neuro: GCS3T on vent  RASS-2 HEENT: Atraumatic, normocephalic, neck supple, no JVD Cardiovascular: Regular rate and rhythm, 2+ pulses Lungs: decreased bs bilaterally with MV sound in back Abdomen: midline surgical site with honeycomb dressing covered, with RUQ ostomy and LUQ drain  Musculoskeletal: Normal bulk and tone, no deformities, no edema Skin: Warm and dry, midline abdominal incision with honeycomb dressing clean dry and intact, JP drains to left upper quadrant and right lower quadrant  LABS:  BMET Recent Labs  Lab 08/07/19 1220 08/07/19 2201 08/08/19 0119  NA 137 139 140  K 4.8 3.8 3.9  CL 98 104 107  CO2 20* 21* 24  BUN 59* 60* 63*  CREATININE 2.49* 2.30* 2.35*  GLUCOSE 212* 123* 127*    Electrolytes Recent Labs  Lab 08/07/19 1220 08/07/19 2201 08/08/19 0119  CALCIUM 8.8* 8.4* 8.2*  MG  --   --  2.3    CBC Recent Labs  Lab 08/07/19 1220 08/07/19 2201 08/08/19 0228  WBC 30.5* 18.3* 26.3*  HGB 13.4 12.5* 12.1*  HCT 42.8 39.8 38.2*  PLT 329 142* 258    Coag's Recent Labs  Lab 08/07/19 1220  APTT 29  INR 1.4*    Sepsis Markers Recent Labs  Lab 08/07/19 1220 08/07/19 2201 08/08/19 0119  LATICACIDVEN 6.4* 1.8  --   PROCALCITON 0.77  --  8.19    ABG Recent Labs  Lab 08/07/19 2130  PHART 7.33*  PCO2ART 47  PO2ART 148*    Liver Enzymes Recent Labs  Lab 08/07/19 1220 08/08/19 0119  AST 52* 225*  ALT 19 148*  ALKPHOS 74 54  BILITOT 1.0 0.9  ALBUMIN 3.5 2.9*    Cardiac Enzymes No results for input(s): TROPONINI, PROBNP in the last 168 hours.  Glucose Recent Labs  Lab 08/07/19 2117 08/07/19 2353 08/08/19 0523 08/08/19 0806 08/08/19 1156  GLUCAP 127* 107* 109* 122* 111*    Imaging CT ABDOMEN PELVIS WO CONTRAST  Result Date: 08/07/2019 CLINICAL DATA:  Abdominal pain, shortness of breath, constipation. Syncope in the bathroom today. Tachypneic. EXAM: CT CHEST, ABDOMEN AND PELVIS WITHOUT CONTRAST TECHNIQUE: Multidetector CT imaging of the  chest, abdomen and pelvis was performed following the standard protocol without IV contrast. COMPARISON:  Multiple exams, including CT chest 05/08/2019 and CT abdomen 11/26/2011 FINDINGS: CT CHEST FINDINGS Cardiovascular: AICD noted. Coronary, aortic arch, and branch vessel atherosclerotic vascular disease. Mild cardiomegaly. Aortic valve calcification. Mediastinum/Nodes: Mildly distended esophagus. No adenopathy identified. Lungs/Pleura: Trace bilateral pleural effusions. Chronic right upper lobe scarring with a nodular portion measuring 1.2 by 0.9 cm on image 36/4, previously the same on 05/08/2019. Right Airway thickening is present, suggesting bronchitis or reactive airways disease. Mild reticulonodular opacities in the left upper lobe and left lower lobe favoring atypical infectious bronchiolitis, similar to the prior exam in general. Bronchiectatic airway plugging in the left lower lobe as on image 70/4. There is some increase in peripheral nodularity in the left lower lobe including a pleural-based 1.3 by 0.9 cm nodule on image 90/4, probably inflammatory given that this was not present 3 months ago, but surveillance is probably warranted. No pneumothorax  or pneumomediastinum. Musculoskeletal: No fracture or acute bony findings identified. CT ABDOMEN PELVIS FINDINGS Hepatobiliary: Unremarkable Pancreas: Hypodense pancreatic lesion measuring 2.7 by 1.9 cm on image 69/2, not well appreciated on prior CT the abdomen from 11/26/2011. This has fluid density internally and could be intraductal papillary mucinous neoplasm or a postinflammatory pancreatic cystic lesion. Spleen: Unremarkable Adrenals/Urinary Tract: Adrenal glands normal. Severe atrophy of the right kidney. 1.5 cm in diameter exophytic fluid density lesion of the left kidney upper pole on image 50/5, likely a cyst. Similar fluid density 0.9 cm exophytic lesion from the left kidney lower pole, image 36/5. Vascular calcifications are present in the left  renal hilum. Stomach/Bowel: Very large amount of free intraperitoneal gas, with additional scattered locules in the abdomen. No dilated small bowel loops. Pneumatosis and distension of the cecum, ascending colon, and transverse colon. Sigmoid colon diverticulosis. Vascular/Lymphatic: Upper abdominal aorta 5.0 cm transverse. Aneurysmal dilatation extends down into the infrarenal abdominal aorta, with a saccular aneurysmal component just below the right renal artery. Infrarenal abdominal aortic stent grafts extending into the common iliac arteries bilaterally. Extensive atherosclerosis. Reproductive: Moderate prostatomegaly. Other: Small amount of free pelvic fluid. Stranding and fluid along the right paracolic gutter. Musculoskeletal: Lumbar spondylosis and degenerative disc disease at L5-S1. IMPRESSION: 1. Very large amount of free intraperitoneal gas with additional scattered locules in the abdomen. Pneumatosis and distension of the cecum, ascending colon, and transverse colon. Appearance suspicious for colon perforation and possible ischemia. Emergent surgical consultation recommended. 2. Extensive atherosclerosis with known abdominal aortic aneurysm. The infrarenal portion of the aneurysm has been treated with stent graft. 3. Trace bilateral pleural effusions. 4. Chronic reticulonodular opacities in the left upper lobe and left lower lobe favoring atypical infectious bronchiolitis. There are some new (from 3 months ago) nodules in the left lower lobe which are probably due to atypical infection, but which warrant surveillance imaging to exclude the unlikely possibility of new neoplastic nodules. 5. 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 6. Mild cardiomegaly. 7. Coronary, aortic arch, and branch vessel atherosclerotic vascular disease. Aortic valve calcification. 8. Hypodense pancreatic lesion measuring 2.7 by 1.9 cm, not well appreciated on prior CT abdomen from 11/26/2011. This is most likely to  be an intraductal papillary mucinous neoplasm or a postinflammatory pancreatic cystic lesion, but enhancement characteristics are not assessed today. Although clearly the patient's current acute issues take precedence, follow up pancreatic protocol MRI with and without contrast would be recommended after complete resolution of the patient's acute situation in order to further characterize this lesion. 9. Severe atrophy of the right kidney. 10. Abdominal aortic aneurysm, with suprarenal portion measuring up to 5.0 cm transverse. Infrarenal abdominal aortic stent grafts extending into the common iliac arteries bilaterally. 11. Moderate prostatomegaly. 12. Lumbar spondylosis and degenerative disc disease. Critical Value/emergent results were called by telephone at the time of interpretation on 08/07/2019 at 2:28 pm to provider Dr. Marjean Donna , who verbally acknowledged these results. Electronically Signed   By: Van Clines M.D.   On: 08/07/2019 14:32   DG Abd 1 View  Result Date: 08/07/2019 CLINICAL DATA:  Pneumoperitoneum, postop EXAM: ABDOMEN - 1 VIEW COMPARISON:  08/07/2019 FINDINGS: Supine frontal view of the abdomen was performed. The pelvis is excluded by collimation. Midline skin staples are seen from laparotomy. Surgical drains project over the right upper and left lower abdomen. Enteric catheter tip projects over gastric body. There is relative paucity of bowel gas. Residual pneumoperitoneum consistent with recent surgical intervention. Endoluminal stent graft  within the aorta. IMPRESSION: 1. Postsurgical changes from midline laparotomy. 2. Support devices as above. Electronically Signed   By: Randa Ngo M.D.   On: 08/07/2019 22:51   CT Head Wo Contrast  Result Date: 08/07/2019 CLINICAL DATA:  Poly trauma, critical, head/cervical spine injury suspected. EXAM: CT HEAD WITHOUT CONTRAST CT CERVICAL SPINE WITHOUT CONTRAST TECHNIQUE: Multidetector CT imaging of the head and cervical spine was  performed following the standard protocol without intravenous contrast. Multiplanar CT image reconstructions of the cervical spine were also generated. COMPARISON:  None. FINDINGS: CT HEAD FINDINGS Brain: Mild generalized age related parenchymal volume loss with commensurate dilatation of the ventricles and sulci. No mass, hemorrhage, edema or other evidence of acute parenchymal abnormality. No extra-axial hemorrhage. Vascular: Chronic calcified atherosclerotic changes of the large vessels at the skull base. No unexpected hyperdense vessel. Skull: Normal. Negative for fracture or focal lesion. Sinuses/Orbits: No acute finding. Other: None. CT CERVICAL SPINE FINDINGS Alignment: Mild scoliosis. Slight reversal of the normal cervical spine lordosis. No evidence of acute vertebral body subluxation. Skull base and vertebrae: No fracture line or displaced fracture fragment seen. Facet joints are normally aligned. Soft tissues and spinal canal: No prevertebral fluid or swelling. No visible canal hematoma. Disc levels: Mild degenerative spondylosis at the C6-7 level. No significant central canal stenosis at any level. Upper chest: RIGHT apical scarring, incompletely imaged, better demonstrated on earlier chest CT of 05/08/2019. No acute findings. Other: Bilateral carotid atherosclerosis. IMPRESSION: 1. No acute intracranial abnormality. No intracranial mass, hemorrhage or edema. No skull fracture. 2. No fracture or acute subluxation within the cervical spine. Mild scoliosis and slight reversal of the normal cervical spine lordosis is likely related to patient positioning or muscle spasm. 3. Carotid atherosclerosis. Electronically Signed   By: Franki Cabot M.D.   On: 08/07/2019 14:20   CT CHEST WO CONTRAST  Result Date: 08/07/2019 CLINICAL DATA:  Abdominal pain, shortness of breath, constipation. Syncope in the bathroom today. Tachypneic. EXAM: CT CHEST, ABDOMEN AND PELVIS WITHOUT CONTRAST TECHNIQUE: Multidetector CT  imaging of the chest, abdomen and pelvis was performed following the standard protocol without IV contrast. COMPARISON:  Multiple exams, including CT chest 05/08/2019 and CT abdomen 11/26/2011 FINDINGS: CT CHEST FINDINGS Cardiovascular: AICD noted. Coronary, aortic arch, and branch vessel atherosclerotic vascular disease. Mild cardiomegaly. Aortic valve calcification. Mediastinum/Nodes: Mildly distended esophagus. No adenopathy identified. Lungs/Pleura: Trace bilateral pleural effusions. Chronic right upper lobe scarring with a nodular portion measuring 1.2 by 0.9 cm on image 36/4, previously the same on 05/08/2019. Right Airway thickening is present, suggesting bronchitis or reactive airways disease. Mild reticulonodular opacities in the left upper lobe and left lower lobe favoring atypical infectious bronchiolitis, similar to the prior exam in general. Bronchiectatic airway plugging in the left lower lobe as on image 70/4. There is some increase in peripheral nodularity in the left lower lobe including a pleural-based 1.3 by 0.9 cm nodule on image 90/4, probably inflammatory given that this was not present 3 months ago, but surveillance is probably warranted. No pneumothorax or pneumomediastinum. Musculoskeletal: No fracture or acute bony findings identified. CT ABDOMEN PELVIS FINDINGS Hepatobiliary: Unremarkable Pancreas: Hypodense pancreatic lesion measuring 2.7 by 1.9 cm on image 69/2, not well appreciated on prior CT the abdomen from 11/26/2011. This has fluid density internally and could be intraductal papillary mucinous neoplasm or a postinflammatory pancreatic cystic lesion. Spleen: Unremarkable Adrenals/Urinary Tract: Adrenal glands normal. Severe atrophy of the right kidney. 1.5 cm in diameter exophytic fluid density lesion of the left kidney  upper pole on image 50/5, likely a cyst. Similar fluid density 0.9 cm exophytic lesion from the left kidney lower pole, image 36/5. Vascular calcifications are  present in the left renal hilum. Stomach/Bowel: Very large amount of free intraperitoneal gas, with additional scattered locules in the abdomen. No dilated small bowel loops. Pneumatosis and distension of the cecum, ascending colon, and transverse colon. Sigmoid colon diverticulosis. Vascular/Lymphatic: Upper abdominal aorta 5.0 cm transverse. Aneurysmal dilatation extends down into the infrarenal abdominal aorta, with a saccular aneurysmal component just below the right renal artery. Infrarenal abdominal aortic stent grafts extending into the common iliac arteries bilaterally. Extensive atherosclerosis. Reproductive: Moderate prostatomegaly. Other: Small amount of free pelvic fluid. Stranding and fluid along the right paracolic gutter. Musculoskeletal: Lumbar spondylosis and degenerative disc disease at L5-S1. IMPRESSION: 1. Very large amount of free intraperitoneal gas with additional scattered locules in the abdomen. Pneumatosis and distension of the cecum, ascending colon, and transverse colon. Appearance suspicious for colon perforation and possible ischemia. Emergent surgical consultation recommended. 2. Extensive atherosclerosis with known abdominal aortic aneurysm. The infrarenal portion of the aneurysm has been treated with stent graft. 3. Trace bilateral pleural effusions. 4. Chronic reticulonodular opacities in the left upper lobe and left lower lobe favoring atypical infectious bronchiolitis. There are some new (from 3 months ago) nodules in the left lower lobe which are probably due to atypical infection, but which warrant surveillance imaging to exclude the unlikely possibility of new neoplastic nodules. 5. 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 6. Mild cardiomegaly. 7. Coronary, aortic arch, and branch vessel atherosclerotic vascular disease. Aortic valve calcification. 8. Hypodense pancreatic lesion measuring 2.7 by 1.9 cm, not well appreciated on prior CT abdomen from 11/26/2011.  This is most likely to be an intraductal papillary mucinous neoplasm or a postinflammatory pancreatic cystic lesion, but enhancement characteristics are not assessed today. Although clearly the patient's current acute issues take precedence, follow up pancreatic protocol MRI with and without contrast would be recommended after complete resolution of the patient's acute situation in order to further characterize this lesion. 9. Severe atrophy of the right kidney. 10. Abdominal aortic aneurysm, with suprarenal portion measuring up to 5.0 cm transverse. Infrarenal abdominal aortic stent grafts extending into the common iliac arteries bilaterally. 11. Moderate prostatomegaly. 12. Lumbar spondylosis and degenerative disc disease. Critical Value/emergent results were called by telephone at the time of interpretation on 08/07/2019 at 2:28 pm to provider Dr. Marjean Donna , who verbally acknowledged these results. Electronically Signed   By: Van Clines M.D.   On: 08/07/2019 14:32   CT Cervical Spine Wo Contrast  Result Date: 08/07/2019 CLINICAL DATA:  Poly trauma, critical, head/cervical spine injury suspected. EXAM: CT HEAD WITHOUT CONTRAST CT CERVICAL SPINE WITHOUT CONTRAST TECHNIQUE: Multidetector CT imaging of the head and cervical spine was performed following the standard protocol without intravenous contrast. Multiplanar CT image reconstructions of the cervical spine were also generated. COMPARISON:  None. FINDINGS: CT HEAD FINDINGS Brain: Mild generalized age related parenchymal volume loss with commensurate dilatation of the ventricles and sulci. No mass, hemorrhage, edema or other evidence of acute parenchymal abnormality. No extra-axial hemorrhage. Vascular: Chronic calcified atherosclerotic changes of the large vessels at the skull base. No unexpected hyperdense vessel. Skull: Normal. Negative for fracture or focal lesion. Sinuses/Orbits: No acute finding. Other: None. CT CERVICAL SPINE FINDINGS  Alignment: Mild scoliosis. Slight reversal of the normal cervical spine lordosis. No evidence of acute vertebral body subluxation. Skull base and vertebrae: No fracture line or displaced  fracture fragment seen. Facet joints are normally aligned. Soft tissues and spinal canal: No prevertebral fluid or swelling. No visible canal hematoma. Disc levels: Mild degenerative spondylosis at the C6-7 level. No significant central canal stenosis at any level. Upper chest: RIGHT apical scarring, incompletely imaged, better demonstrated on earlier chest CT of 05/08/2019. No acute findings. Other: Bilateral carotid atherosclerosis. IMPRESSION: 1. No acute intracranial abnormality. No intracranial mass, hemorrhage or edema. No skull fracture. 2. No fracture or acute subluxation within the cervical spine. Mild scoliosis and slight reversal of the normal cervical spine lordosis is likely related to patient positioning or muscle spasm. 3. Carotid atherosclerosis. Electronically Signed   By: Franki Cabot M.D.   On: 08/07/2019 14:20   DG Chest Port 1 View  Result Date: 08/08/2019 CLINICAL DATA:  Central line placement EXAM: PORTABLE CHEST 1 VIEW COMPARISON:  08/07/2019 FINDINGS: Endotracheal tube at the thoracic inlet, 10 cm above the carina. Right IJ venous catheter terminates in the proximal SVC. Enteric tube courses into the stomach. Lungs are essentially clear.  No pleural effusion or pneumothorax. The heart is normal in size. Left subclavian pacemaker. Thoracic aortic atherosclerosis. IMPRESSION: Endotracheal tube terminates 10 cm above the carina. Right IJ venous catheter terminates in the proximal SVC. Enteric tube courses into the stomach. Electronically Signed   By: Julian Hy M.D.   On: 08/08/2019 01:14   DG Chest Port 1 View  Result Date: 08/07/2019 CLINICAL DATA:  Intubated, syncope, pneumoperitoneum EXAM: PORTABLE CHEST 1 VIEW COMPARISON:  08/07/2019 FINDINGS: 2 frontal views endotracheal tube overlying of  the chest demonstrate tracheal air column tip at level of thoracic inlet. Enteric catheter passes below diaphragm tip excluded by collimation. Dual lead pacer/AICD unchanged. Numerous cardiac leads overlie the chest. Cardiac silhouette is unremarkable. No airspace disease, effusion, or pneumothorax. No acute bony abnormalities. IMPRESSION: 1. Support devices as above. 2. No acute intrathoracic process. Electronically Signed   By: Randa Ngo M.D.   On: 08/07/2019 22:49     STUDIES:  2/13- CT Head w/o Contrast>> No acute intracranial abnormality. No intracranial mass, hemorrhage or edema. No skull fracture 2/13- CT Cervical Spine w/o contrast>>No fracture or acute subluxation within the cervical spine. Mild scoliosis and slight reversal of the normal cervical spine lordosis is likely related to patient positioning or muscle spasm. Carotid atherosclerosis 2/13- CT Abdomen & Pelvis w/o contrast>> 1. Very large amount of free intraperitoneal gas with additional scattered locules in the abdomen. Pneumatosis and distension of the cecum, ascending colon, and transverse colon. Appearance suspicious for colon perforation and possible ischemia. Emergent surgical consultation recommended. 2. Extensive atherosclerosis with known abdominal aortic aneurysm. The infrarenal portion of the aneurysm has been treated with stent Graft. Hypodense pancreatic lesion measuring 2.7 by 1.9 cm, not well appreciated on prior CT abdomen from 11/26/2011. This is most likely to be an intraductal papillary mucinous neoplasm or a postinflammatory pancreatic cystic lesion, but enhancement characteristics are not assessed today. Although clearly the patient's current acute issues take precedence, follow up pancreatic protocol MRI with and without contrast would be recommended after complete resolution of the patient's acute situation in order to further characterize this lesion. 9. Severe atrophy of the right kidney. 10.  Abdominal aortic aneurysm, with suprarenal portion measuring up to 5.0 cm transverse. Infrarenal abdominal aortic stent grafts extending into the common iliac arteries bilaterally. 11. Moderate prostatomegaly. 12. Lumbar spondylosis and degenerative disc disease 2/13- CT Chest w/o contrast >>1. Trace bilateral pleural effusions. 2. Chronic reticulonodular opacities in the  left upper lobe and left lower lobe favoring atypical infectious bronchiolitis. There are some new (from 3 months ago) nodules in the left lower lobe which are probably due to atypical infection, but which warrant surveillance imaging to exclude the unlikely possibility of new neoplastic nodules. 3. 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 4. Mild cardiomegaly. 5. Coronary, aortic arch, and branch vessel atherosclerotic vascular disease. Aortic valve calcification  CULTURES: SARS-CoV-2 PCR 2/13>> POSITIVE Influenza PCR 2/13>> negative Blood culture x2 2/13>> Tracheal aspirate 2/13>> Strep pneumo urinary antigen 2/13>> Legionella urinary antigen 2/13>>  ANTIBIOTICS: Cefepime x1 dose in ED 2/13 Flagyl x1 dose in ED 2/13 Vancomycin x1 dose in ED 2/13 Zosyn 2/13>> Remdesivir 2/13>>  SIGNIFICANT EVENTS: 2/13>> Take emergently to OR for Exploratory Laparotomy 2/13>> Admitted to ICU, remains intubated post procedure 2/13>>Hypotensive, requiring vasopressors 2/14>> Right IJ CVC placed 2/14>> ST depression in inferior and lateral leads, Cardiology consulted  LINES/TUBES: ETT 2/13>> Right IJ CVC 2/14>>     ASSESSMENT / PLAN:   Acute COVID 19 infection   - currently weaning down ventilator support - FiO2 28%  -plan for awakening trial with SBT today   - discussed with surgery - ok to proceed with weaning trial today  -remdesevir with pharmacy consult   - CRP >24 - will consider actemra - patient on low support via vent   Pneumoperitoneum - s/p subtotal colectomy POD1 -surgical site  evaluate by surgery today - appreciate recommendations  -NG to LIS  - continue Zosyn IV per pharmD consult  Septic shock  - possible intra-abdominal vs COVID19 associated sepsis  - present on admission   - receiving levophed for pressure support  - low urine output today in context of CKD - 1L bolus of LR at 250/hr   - CXR reviewed - mild interstitial infiltrates   Chronic kidney disease   -stage 3   -monitor urine output- made <100cc during dayshift 08/08/19   -d/c nonessential nephrotoxic medications  - IVF - LR currently 1L   Complicating comorbid conditions -Advanced COPD - patient not on systemic steroids post GI surgery but we will start pulmicort bid 0.25 bid and duoNeb 1q6h -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  He confirms is dad is a DNR.  He would like to try aggressive measures for the short term to see if his father can recover, but would not want tracheostomy or feeding tube.   Critical care provider statement:    Critical care time (minutes):  33   Critical care time was exclusive of:  Separately billable procedures and  treating other patients   Critical care was necessary to treat or prevent imminent or  life-threatening deterioration of the following conditions:  sepsis, CKD,COVID19, pneumoperitoneum, CHF COPD   Critical care was time spent personally by me on the following  activities:  Development of treatment plan with patient or surrogate,  discussions with consultants, evaluation of patient's response to  treatment, examination of patient, obtaining history from patient or  surrogate, ordering and performing treatments and interventions, ordering  and review of laboratory studies and re-evaluation of patient's condition   I assumed direction of critical care for this patient from another  provider in  my specialty: no  Ottie Glazier, M.D.  [facility] [place]

## 2019-08-09 DIAGNOSIS — J9601 Acute respiratory failure with hypoxia: Secondary | ICD-10-CM

## 2019-08-09 DIAGNOSIS — K668 Other specified disorders of peritoneum: Secondary | ICD-10-CM

## 2019-08-09 DIAGNOSIS — R6521 Severe sepsis with septic shock: Secondary | ICD-10-CM

## 2019-08-09 LAB — GLUCOSE, CAPILLARY
Glucose-Capillary: 121 mg/dL — ABNORMAL HIGH (ref 70–99)
Glucose-Capillary: 107 mg/dL — ABNORMAL HIGH (ref 70–99)
Glucose-Capillary: 106 mg/dL — ABNORMAL HIGH (ref 70–99)
Glucose-Capillary: 99 mg/dL (ref 70–99)
Glucose-Capillary: 110 mg/dL — ABNORMAL HIGH (ref 70–99)

## 2019-08-09 LAB — FIBRIN DERIVATIVES D-DIMER (ARMC ONLY): Fibrin derivatives D-dimer (ARMC): 5631.77 ng/mL (FEU) — ABNORMAL HIGH (ref 0.00–499.00)

## 2019-08-09 LAB — COMPREHENSIVE METABOLIC PANEL
ALT: 183 U/L — ABNORMAL HIGH (ref 0–44)
AST: 153 U/L — ABNORMAL HIGH (ref 15–41)
Albumin: 2.4 g/dL — ABNORMAL LOW (ref 3.5–5.0)
Alkaline Phosphatase: 47 U/L (ref 38–126)
Anion gap: 10 (ref 5–15)
BUN: 71 mg/dL — ABNORMAL HIGH (ref 8–23)
CO2: 21 mmol/L — ABNORMAL LOW (ref 22–32)
Calcium: 8.2 mg/dL — ABNORMAL LOW (ref 8.9–10.3)
Chloride: 110 mmol/L (ref 98–111)
Creatinine, Ser: 2.27 mg/dL — ABNORMAL HIGH (ref 0.61–1.24)
GFR calc Af Amer: 31 mL/min — ABNORMAL LOW (ref >60)
GFR calc non Af Amer: 27 mL/min — ABNORMAL LOW (ref >60)
Glucose, Bld: 123 mg/dL — ABNORMAL HIGH (ref 70–99)
Potassium: 3.8 mmol/L (ref 3.5–5.1)
Sodium: 141 mmol/L (ref 135–145)
Total Bilirubin: 0.7 mg/dL (ref 0.3–1.2)
Total Protein: 5.3 g/dL — ABNORMAL LOW (ref 6.5–8.1)

## 2019-08-09 LAB — CBC
HCT: 33.5 % — ABNORMAL LOW (ref 39.0–52.0)
Hemoglobin: 10.9 g/dL — ABNORMAL LOW (ref 13.0–17.0)
MCH: 28.5 pg (ref 26.0–34.0)
MCHC: 32.5 g/dL (ref 30.0–36.0)
MCV: 87.5 fL (ref 80.0–100.0)
Platelets: 185 10*3/uL (ref 150–400)
RBC: 3.83 MIL/uL — ABNORMAL LOW (ref 4.22–5.81)
RDW: 15.1 % (ref 11.5–15.5)
WBC: 17.7 10*3/uL — ABNORMAL HIGH (ref 4.0–10.5)
nRBC: 0 % (ref 0.0–0.2)

## 2019-08-09 LAB — C-REACTIVE PROTEIN: CRP: 30.5 mg/dL — ABNORMAL HIGH (ref <1.0)

## 2019-08-09 LAB — FERRITIN: Ferritin: 347 ng/mL — ABNORMAL HIGH (ref 24–336)

## 2019-08-09 LAB — PROCALCITONIN: Procalcitonin: 7.11 ng/mL

## 2019-08-09 MED ORDER — AMIODARONE HCL IN DEXTROSE 360-4.14 MG/200ML-% IV SOLN
60.0000 mg/h | INTRAVENOUS | Status: DC
  Administered 2019-08-09: 21:00:00 60 mg/h via INTRAVENOUS
  Filled 2019-08-09: qty 200

## 2019-08-09 MED ORDER — FUROSEMIDE 10 MG/ML IJ SOLN
40.0000 mg | Freq: Once | INTRAMUSCULAR | Status: AC
  Administered 2019-08-09: 40 mg via INTRAVENOUS
  Filled 2019-08-09: qty 4

## 2019-08-09 MED ORDER — AMIODARONE HCL IN DEXTROSE 360-4.14 MG/200ML-% IV SOLN
30.0000 mg/h | INTRAVENOUS | Status: DC
  Administered 2019-08-10: 30 mg/h via INTRAVENOUS
  Filled 2019-08-09: qty 200

## 2019-08-09 MED ORDER — AMIODARONE IV BOLUS ONLY 150 MG/100ML
150.0000 mg | Freq: Once | INTRAVENOUS | Status: AC
  Administered 2019-08-09: 150 mg via INTRAVENOUS
  Filled 2019-08-09: qty 100

## 2019-08-09 NOTE — Progress Notes (Signed)
Patient ID: Phillip Allison., male   DOB: [birth year], 79 y.o.   MRN: [id number]  Patient will be followed by [facility] hospitalist as consultants from 08/10/19  [name] with PMH of COPD on 2L [place], HFrEF, CAD, and CKD III admitted 08/07/19 with severe pneumoperitoneum and perforated viscus where he was taken for emergency exploratory Laparotomy.  He required subtotal colectomy with end ileostomy.  Also found to be positive for COVID-19  Internal medicine to manage medical issues.

## 2019-08-09 NOTE — Progress Notes (Signed)
CRITICAL CARE NOTE [name] with PMH of COPD on 2L [place], HFrEF, CAD, and CKD III admitted 08/07/19 with severe pneumoperitoneum and perforated viscus where he was taken for emergency exploratory Laparotomy.  He required subtotal colectomy with end ileostomy.  Also found to be positive for COVID-19.  Returns to ICU from OR, remains intubated. After arrival to ICU noted to be hypotensive concerning for developing septic shock, and possible cardiogenic shock due to questionable NSTEMI.      EVENTS:  08/08/19- weaning ventilator support, awakening trial, hemodynamics improved. SBT today.   -Dr Hampton Abbot  Has relayed- no additional surgical intervention 2/15 remains on vent, critically ill, plan for SAT/SBT assessments  CULTURES: SARS-CoV-2 PCR 2/13>> POSITIVE Influenza PCR 2/13>> negative Blood culture x2 2/13>> Tracheal aspirate 2/13>> Strep pneumo urinary antigen 2/13>> Legionella urinary antigen 2/13>>  ANTIBIOTICS: Cefepime x1 dose in ED 2/13 Flagyl x1 dose in ED 2/13 Vancomycin x1 dose in ED 2/13 Zosyn 2/13>> Remdesivir 2/13>>  SIGNIFICANT EVENTS: 2/13>> Take emergently to OR for Exploratory Laparotomy 2/13>> Admitted to ICU, remains intubated post procedure 2/13>>Hypotensive, requiring vasopressors 2/14>> Right IJ CVC placed 2/14>> ST depression in inferior and lateral leads, Cardiology consulted  LINES/TUBES: ETT 2/13>> Right IJ CVC 2/14>>    CC  follow up respiratory failure  SUBJECTIVE Patient remains critically ill Prognosis is guarded Remains on vent    BP (!) 120/46   Pulse 67   Temp 99.4 F (37.4 C) (Oral)   Resp 20   Ht '5\' 7"'$  (1.702 m)   Wt 85.4 kg   SpO2 96%   BMI 29.49 kg/m    I/O last 3 completed shifts: In: 5329.3 [I.V.:4639; IV Piggyback:690.4] Out: 2203 [EFEOF:1219; Drains:480; Stool:195; Blood:50] No intake/output data recorded.  SpO2: 96 % O2 Flow Rate (L/min): 3 L/min FiO2 (%): 30 %    REVIEW OF SYSTEMS  PATIENT IS UNABLE  TO PROVIDE COMPLETE REVIEW OF SYSTEMS DUE TO SEVERE CRITICAL ILLNESS   PHYSICAL EXAMINATION:  GENERAL:critically ill appearing, +resp distress PULMONARY: +rhonchi, +wheezing CARDIOVASCULAR: S1 and S2. Regular rate and rhythm. No murmurs, rubs, or gallops.  GASTROINTESTINAL: Soft, nontender, -distended.  Positive bowel sounds.   MUSCULOSKELETAL: No swelling, clubbing, or edema.  NEUROLOGIC: obtunded, GCS<8 SKIN:intact,warm,dry  MEDICATIONS: I have reviewed all medications and confirmed regimen as documented   CULTURE RESULTS   Recent Results (from the past 240 hour(s))  Blood culture (routine x 2)     Status: None (Preliminary result)   Collection Time: 08/07/19 12:19 PM   Specimen: BLOOD  Result Value Ref Range Status   Specimen Description BLOOD ANTERIOR R FOREAM  Final   Special Requests   Final    BOTTLES DRAWN AEROBIC AND ANAEROBIC Blood Culture adequate volume   Culture   Final    NO GROWTH < 24 HOURS Performed at [facility], [address] [place], [place] [address]    Report Status PENDING  Incomplete  Blood culture (routine x 2)     Status: None (Preliminary result)   Collection Time: 08/07/19 12:20 PM   Specimen: BLOOD  Result Value Ref Range Status   Specimen Description BLOOD R FOREARM  Final   Special Requests   Final    BOTTLES DRAWN AEROBIC AND ANAEROBIC Blood Culture adequate volume   Culture   Final    NO GROWTH < 24 HOURS Performed at [facility], [address], [place], [place] [address]    Report Status PENDING  Incomplete  Respiratory Panel by RT  PCR (Flu A&B, Covid) - Nasopharyngeal Swab     Status: Abnormal   Collection Time: 08/07/19 12:59 PM   Specimen: Nasopharyngeal Swab  Result Value Ref Range Status   SARS Coronavirus 2 by RT PCR POSITIVE (A) NEGATIVE Final    Comment: RESULT CALLED TO, READ BACK BY AND VERIFIED WITH: ASHLEY MURRAY AT 5625 08/07/19.PMF (NOTE) SARS-CoV-2 target nucleic acids are DETECTED. SARS-CoV-2  RNA is generally detectable in upper respiratory specimens  during the acute phase of infection. Positive results are indicative of the presence of the identified virus, but do not rule out bacterial infection or co-infection with other pathogens not detected by the test. Clinical correlation with patient history and other diagnostic information is necessary to determine patient infection status. The expected result is Negative. Fact Sheet for Patients:  PinkCheek.be Fact Sheet for Healthcare Providers: GravelBags.it This test is not yet approved or cleared by the [place] FDA and  has been authorized for detection and/or diagnosis of SARS-CoV-2 by FDA under an Emergency Use Authorization (EUA).  This EUA will remain in effect (meaning this test can be used) for  the duration of  the COVID-19 declaration under Section 564(b)(1) of the Act, 21 U.S.C. section 360bbb-3(b)(1), unless the authorization is terminated or revoked sooner.    Influenza A by PCR NEGATIVE NEGATIVE Final   Influenza B by PCR NEGATIVE NEGATIVE Final    Comment: (NOTE) The Xpert Xpress SARS-CoV-2/FLU/RSV assay is intended as an aid in  the diagnosis of influenza from Nasopharyngeal swab specimens and  should not be used as a sole basis for treatment. Nasal washings and  aspirates are unacceptable for Xpert Xpress SARS-CoV-2/FLU/RSV  testing. Fact Sheet for Patients: PinkCheek.be Fact Sheet for Healthcare Providers: GravelBags.it This test is not yet approved or cleared by the [place] FDA and  has been authorized for detection and/or diagnosis of SARS-CoV-2 by  FDA under an Emergency Use Authorization (EUA). This EUA will remain  in effect (meaning this test can be used) for the duration of the  Covid-19 declaration under Section 564(b)(1) of the Act, 21  U.S.C. section 360bbb-3(b)(1),  unless the authorization is  terminated or revoked. Performed at [facility], [address] [place], [place] [address]   MRSA PCR Screening     Status: None   Collection Time: 08/07/19  9:00 PM   Specimen: Nasopharyngeal  Result Value Ref Range Status   MRSA by PCR NEGATIVE NEGATIVE Final    Comment:        The GeneXpert MRSA Assay (FDA approved for NASAL specimens only), is one component of a comprehensive MRSA colonization surveillance program. It is not intended to diagnose MRSA infection nor to guide or monitor treatment for MRSA infections. Performed at [facility], [address] [place], [place] [address]   Culture, respiratory (non-expectorated)     Status: None (Preliminary result)   Collection Time: 08/08/19  8:26 AM   Specimen: Tracheal Aspirate; Respiratory  Result Value Ref Range Status   Specimen Description   Final    TRACHEAL ASPIRATE Performed at [facility], [address] [place], [place] [address]    Special Requests   Final    Normal Performed at [facility], [place], [place] [address]    Gram Stain   Final    RARE WBC PRESENT,BOTH PMN AND MONONUCLEAR NO ORGANISMS SEEN NO SQUAMOUS EPITHELIAL CELLS PRESENT    Culture   Final    RARE YEAST CULTURE  REINCUBATED FOR BETTER GROWTH Performed at [facility], [place] [address] [place], [place] [address]    Report Status PENDING  Incomplete      BMP Latest Ref Rng & Units 08/09/2019 08/08/2019 08/07/2019  Glucose 70 - 99 mg/dL 123(H) 127(H) 123(H)  BUN 8 - 23 mg/dL 71(H) 63(H) 60(H)  Creatinine 0.61 - 1.24 mg/dL 2.27(H) 2.35(H) 2.30(H)  BUN/Creat Ratio 10 - 24 - - -  Sodium 135 - 145 mmol/L 141 140 139  Potassium 3.5 - 5.1 mmol/L 3.8 3.9 3.8  Chloride 98 - 111 mmol/L 110 107 104  CO2 22 - 32 mmol/L 21(L) 24 21(L)  Calcium 8.9 - 10.3 mg/dL 8.2(L) 8.2(L) 8.4(L)   CBC    Component Value Date/Time   WBC 17.7 (H) 08/09/2019 0440   RBC  3.83 (L) 08/09/2019 0440   HGB 10.9 (L) 08/09/2019 0440   HGB 12.8 (L) 09/10/2018 0957   HCT 33.5 (L) 08/09/2019 0440   HCT 39.3 09/10/2018 0957   PLT 185 08/09/2019 0440   PLT 247 09/10/2018 0957   MCV 87.5 08/09/2019 0440   MCV 85 09/10/2018 0957   MCV 87 07/21/2012 1129   MCH 28.5 08/09/2019 0440   MCHC 32.5 08/09/2019 0440   RDW 15.1 08/09/2019 0440   RDW 12.1 09/10/2018 0957   RDW 12.7 07/21/2012 1129   LYMPHSABS 4.8 (H) 08/07/2019 1220   LYMPHSABS 1.7 09/10/2018 0957   LYMPHSABS 2.3 07/21/2012 1129   MONOABS 1.7 (H) 08/07/2019 1220   MONOABS 0.8 07/21/2012 1129   EOSABS 0.0 08/07/2019 1220   EOSABS 0.3 09/10/2018 0957   EOSABS 0.4 07/21/2012 1129   BASOSABS 0.1 08/07/2019 1220   BASOSABS 0.1 09/10/2018 0957   BASOSABS 0.1 07/21/2012 1129     Indwelling Urinary Catheter continued, requirement due to   Reason to continue Indwelling Urinary Catheter strict Intake/Output monitoring for hemodynamic instability   Central Line/ continued, requirement due to  Reason to continue [place] of central venous pressure or other hemodynamic parameters and poor IV access   Ventilator continued, requirement due to severe respiratory failure   Ventilator Sedation RASS 0 to -2      ASSESSMENT AND PLAN SYNOPSIS   Severe ACUTE Hypoxic and Hypercapnic Respiratory Failure from sepsis and perforated viscus with COVID 19 infection -continue Full MV support -continue Bronchodilator Therapy -Wean Fio2 and PEEP as tolerated -will perform SAT/SBT when respiratory parameters are met  PERFORATED VISCUS AND PNEUMOPERITONEUM WITH SEPSIS -this may impede weaning trials  Follow up gen surgery recs   SEVERE COPD  -continue NEB THERAPY as prescribed   ACUTE SYSTOLIC CARDIAC FAILURE- EF 30% -follow up cardiology recs    ACUTE KIDNEY INJURY/Renal Failure -follow chem 7 -follow UO -continue Foley Catheter-assess need -Avoid nephrotoxic agents -Recheck creatinine     NEUROLOGY - intubated and sedated - minimal sedation to achieve a RASS goal: -1 Wake up assessment pending   SHOCK-SEPSIS/HYPOVOLUMIC/CARDIOGENIC -use vasopressors to keep MAP>65 -follow ABG and LA -follow up cultures -emperic ABX -consider stress dose steroids   CARDIAC ICU monitoring  ID -continue IV abx as prescibed -follow up cultures  GI GI PROPHYLAXIS as indicated  NUTRITIONAL STATUS DIET-->TF's as tolerated Constipation protocol as indicated  ENDO - will use ICU hypoglycemic\Hyperglycemia protocol if indicated   ELECTROLYTES -follow labs as needed -replace as needed -pharmacy consultation and following   DVT/GI PRX ordered TRANSFUSIONS AS NEEDED MONITOR FSBS ASSESS the need for LABS as needed   Critical Care Time devoted to  patient care services described in this note is 35 minutes.   Overall, patient is critically ill, prognosis is guarded.  Patient with Multiorgan failure and at high risk for cardiac arrest and death.   Prognosis is poor and guarded.   Corrin Parker, M.D.  [facility]  Medical Director [place] Director [facility] Cardio-Pulmonary Department

## 2019-08-09 NOTE — Consult Note (Addendum)
[place] Nurse ostomy consult note Pt had ileostomy surgery performed on 2/13 and is in isolation for Covid in ICU.  Stoma type/location:  Stoma is red and viable, 1 1/4 inches, slightly above skin level Peristomal assessment: intact skin surrounding Output: mod amt liquid brown stool  Ostomy pouching: 1pc.  Education provided:  Current pouch was leaking behind the barrier.  Applied 2 piece pouch and added a barrier ring to attempt to maintain a seal.  Pt watched the process for pouch change and emptying but did not participate.  He states his son lives with him and will need to learn the process prior to discharge.  No family members present at this time.  Extra supplies left at the bedside and educational materials in the room.  [place] team will continue to follow for further educational sessions.  Enrolled patient in [place] program: Not yet Julien Girt MSN, [place], [place], [place], [place]

## 2019-08-09 NOTE — Progress Notes (Signed)
[place] Hospital Day(s): 2.   Post op day(s): 2 Days Post-Op.   Interval History:  Patient seen and examined, Extubated this morning; off vasopressors Patient reports he is feeling okay this morning Leukocytosis improved to 17.7K; afebrile Slight improvement in sCr to 2.27; U/O about 300 ccs on examination No ileostomy output yet; [place] RN following Surgical drains x2 with 250 ccs out NGT output low  Vital signs in last 24 hours: [min-max] current  Temp:  [97.6 F (36.4 C)-99.4 F (37.4 C)] 98.8 F (37.1 C) (02/15 0800) Pulse Rate:  [58-70] 59 (02/15 0930) Resp:  [18-20] 19 (02/15 0930) BP: (91-169)/(42-130) 112/52 (02/15 0930) SpO2:  [94 %-99 %] 98 % (02/15 0930) FiO2 (%):  [30 %] 30 % (02/15 1037)     Height: 5\' 7"  (170.2 cm) Weight: 85.4 kg BMI (Calculated): 29.48   Intake/Output last 2 shifts:  02/14 0701 - 02/15 0700 In: 2252.5 [I.V.:1902.4; IV Piggyback:350.1] Out: 8676 [Urine:1198; Drains:250; Stool:195]   Physical Exam:  Constitutional: alert, cooperative and no distress  HEENT: NGT in place Respiratory: breathing non-labored at rest  Cardiovascular: regular rate and sinus rhythm  Gastrointestinal: soft, incisional soreness, and non-distended, no rebound/guarding. Ileostomy in the left abdomen, ostomy is pink and patent, no gas or stool in bag. JP drains in both LLQ and RLQ with serosanguinous output Integumentary: Laparotomy incision is CDI with staples and honeycomb, no appreciable erythema  Labs:  CBC Latest Ref Rng & Units 08/09/2019 08/08/2019 08/07/2019  WBC 4.0 - 10.5 K/uL 17.7(H) 26.3(H) 18.3(H)  Hemoglobin 13.0 - 17.0 g/dL 10.9(L) 12.1(L) 12.5(L)  Hematocrit 39.0 - 52.0 % 33.5(L) 38.2(L) 39.8  Platelets 150 - 400 K/uL 185 258 142(L)   CMP Latest Ref Rng & Units 08/09/2019 08/08/2019 08/07/2019  Glucose 70 - 99 mg/dL 123(H) 127(H) 123(H)  BUN 8 - 23 mg/dL 71(H) 63(H) 60(H)  Creatinine 0.61 - 1.24 mg/dL 2.27(H)  2.35(H) 2.30(H)  Sodium 135 - 145 mmol/L 141 140 139  Potassium 3.5 - 5.1 mmol/L 3.8 3.9 3.8  Chloride 98 - 111 mmol/L 110 107 104  CO2 22 - 32 mmol/L 21(L) 24 21(L)  Calcium 8.9 - 10.3 mg/dL 8.2(L) 8.2(L) 8.4(L)  Total Protein 6.5 - 8.1 g/dL 5.3(L) 5.6(L) -  Total Bilirubin 0.3 - 1.2 mg/dL 0.7 0.9 -  Alkaline Phos 38 - 126 U/L 47 54 -  AST 15 - 41 U/L 153(H) 225(H) -  ALT 0 - 44 U/L 183(H) 148(H) -     Imaging studies: No new pertinent imaging studies   Assessment/Plan:  79 y.o. male now extubated still awaiting return of bowel function 2 Days Post-Op s/p exploratory laparotomy, subtotal colostomy, and creation of end ileostomy for pneumoperitoneum and large bowel obstruction, complicated by pertinent comorbidities including COVID +.   - Continue NGT decompression today; monitor output  - Continue foley catheter; monitor U/O + renal function  - NPO + IVF resuscitation  - Pain control prn; antiemetics prn  - Monitor abdominal examination; ileostomy function  - Appreciate WOC RN assistance with ileostomy care  - Will engage PT if feasible  - Appreciate PCCM assistance; hopefully transfer to floor tomorrow   All of the above findings and recommendations were discussed with the patient, patient's family (son), and the medical team, and all of patient's questions were answered to their expressed satisfaction.  -- Edison Simon, PA-C [facility] 08/09/2019, 1:42 PM [phone] M-F: 7am - 4pm

## 2019-08-09 NOTE — Progress Notes (Signed)
[facility] Encounter Note  Patient: Phillip Allison. / Admit Date: 08/07/2019 / Date of Encounter: 08/09/2019, 7:48 AM   Subjective: Patient appears to have normal sinus rhythm at this time with no evidence of significant rhythm disturbances throughout the evening.  Apparent vent settings improved and patient overall stable.  Troponin level slightly elevated most consistent with demand ischemia sepsis and possible surgical intervention.  Chest x-ray has appeared to be relatively normal and no evidence of pneumonia Patient previously has had known cardiomyopathy LV systolic dysfunction with ejection fraction of 20% ICD placement and peripheral vascular disease which appear to be stable and patient has tolerated surgical intervention relatively well   Objective: Telemetry: Normal sinus rhythm Physical Exam: Blood pressure (!) 120/46, pulse 67, temperature 99.4 F (37.4 C), temperature source Oral, resp. rate 20, height _0  (1.702 m), weight 85.4 kg, SpO2 96 %. Body mass index is 29.49 kg/m. As per critical care  Intake/Output Summary (Last 24 hours) at 08/09/2019 0748 Last data filed at 08/09/2019 0600 Gross per 24 hour  Intake 2252.49 ml  Output 1643 ml  Net 609.49 ml    Inpatient Medications:  . chlorhexidine gluconate (MEDLINE KIT)  15 mL Mouth Rinse BID  . Chlorhexidine Gluconate Cloth  6 each Topical Daily  . dexamethasone (DECADRON) injection  6 mg Intravenous Q24H  . fentaNYL (SUBLIMAZE) injection  25 mcg Intravenous Once  . heparin  5,000 Units Subcutaneous Q8H  . insulin aspart  0-15 Units Subcutaneous Q4H  . mouth rinse  15 mL Mouth Rinse 10 times per day  . pantoprazole (PROTONIX) IV  40 mg Intravenous QHS   Infusions:  . dexmedetomidine (PRECEDEX) IV infusion 0.4 mcg/kg/hr (08/09/19 0400)  . fentaNYL infusion INTRAVENOUS Stopped (08/08/19 1511)  . lactated ringers 100 mL/hr at 08/08/19 2231  . norepinephrine (LEVOPHED) Adult infusion 2  mcg/min (08/09/19 0110)  . piperacillin-tazobactam (ZOSYN)  IV 3.375 g (08/09/19 0602)  . remdesivir 100 mg in NS 100 mL      Labs: Recent Labs    08/08/19 0119 08/09/19 0440  NA 140 141  K 3.9 3.8  CL 107 110  CO2 24 21*  GLUCOSE 127* 123*  BUN 63* 71*  CREATININE 2.35* 2.27*  CALCIUM 8.2* 8.2*  MG 2.3  --    Recent Labs    08/08/19 0119 08/09/19 0440  AST 225* 153*  ALT 148* 183*  ALKPHOS 54 47  BILITOT 0.9 0.7  PROT 5.6* 5.3*  ALBUMIN 2.9* 2.4*   Recent Labs    08/07/19 1220 08/07/19 2201 08/08/19 0228 08/09/19 0440  WBC 30.5*   < > 26.3* 17.7*  NEUTROABS 23.4*  --   --   --   HGB 13.4   < > 12.1* 10.9*  HCT 42.8   < > 38.2* 33.5*  MCV 90.5   < > 89.5 87.5  PLT 329   < > 258 185   < > = values in this interval not displayed.   No results for input(s): CKTOTAL, CKMB, TROPONINI in the last 72 hours. Invalid input(s): POCBNP Recent Labs    08/07/19 2201  HGBA1C 5.8*     Weights: Filed Weights   08/07/19 1233 08/08/19 0200  Weight: 79.4 kg 85.4 kg     Radiology/Studies:  CT ABDOMEN PELVIS WO CONTRAST  Result Date: 08/07/2019 CLINICAL DATA:  Abdominal pain, shortness of breath, constipation. Syncope in the bathroom today. Tachypneic. EXAM: CT CHEST, ABDOMEN AND PELVIS WITHOUT CONTRAST TECHNIQUE: Multidetector CT  imaging of the chest, abdomen and pelvis was performed following the standard protocol without IV contrast. COMPARISON:  Multiple exams, including CT chest 05/08/2019 and CT abdomen 11/26/2011 FINDINGS: CT CHEST FINDINGS Cardiovascular: AICD noted. Coronary, aortic arch, and branch vessel atherosclerotic vascular disease. Mild cardiomegaly. Aortic valve calcification. Mediastinum/Nodes: Mildly distended esophagus. No adenopathy identified. Lungs/Pleura: Trace bilateral pleural effusions. Chronic right upper lobe scarring with a nodular portion measuring 1.2 by 0.9 cm on image 36/4, previously the same on 05/08/2019. Right Airway thickening is  present, suggesting bronchitis or reactive airways disease. Mild reticulonodular opacities in the left upper lobe and left lower lobe favoring atypical infectious bronchiolitis, similar to the prior exam in general. Bronchiectatic airway plugging in the left lower lobe as on image 70/4. There is some increase in peripheral nodularity in the left lower lobe including a pleural-based 1.3 by 0.9 cm nodule on image 90/4, probably inflammatory given that this was not present 3 months ago, but surveillance is probably warranted. No pneumothorax or pneumomediastinum. Musculoskeletal: No fracture or acute bony findings identified. CT ABDOMEN PELVIS FINDINGS Hepatobiliary: Unremarkable Pancreas: Hypodense pancreatic lesion measuring 2.7 by 1.9 cm on image 69/2, not well appreciated on prior CT the abdomen from 11/26/2011. This has fluid density internally and could be intraductal papillary mucinous neoplasm or a postinflammatory pancreatic cystic lesion. Spleen: Unremarkable Adrenals/Urinary Tract: Adrenal glands normal. Severe atrophy of the right kidney. 1.5 cm in diameter exophytic fluid density lesion of the left kidney upper pole on image 50/5, likely a cyst. Similar fluid density 0.9 cm exophytic lesion from the left kidney lower pole, image 36/5. Vascular calcifications are present in the left renal hilum. Stomach/Bowel: Very large amount of free intraperitoneal gas, with additional scattered locules in the abdomen. No dilated small bowel loops. Pneumatosis and distension of the cecum, ascending colon, and transverse colon. Sigmoid colon diverticulosis. Vascular/Lymphatic: Upper abdominal aorta 5.0 cm transverse. Aneurysmal dilatation extends down into the infrarenal abdominal aorta, with a saccular aneurysmal component just below the right renal artery. Infrarenal abdominal aortic stent grafts extending into the common iliac arteries bilaterally. Extensive atherosclerosis. Reproductive: Moderate prostatomegaly.  Other: Small amount of free pelvic fluid. Stranding and fluid along the right paracolic gutter. Musculoskeletal: Lumbar spondylosis and degenerative disc disease at L5-S1. IMPRESSION: 1. Very large amount of free intraperitoneal gas with additional scattered locules in the abdomen. Pneumatosis and distension of the cecum, ascending colon, and transverse colon. Appearance suspicious for colon perforation and possible ischemia. Emergent surgical consultation recommended. 2. Extensive atherosclerosis with known abdominal aortic aneurysm. The infrarenal portion of the aneurysm has been treated with stent graft. 3. Trace bilateral pleural effusions. 4. Chronic reticulonodular opacities in the left upper lobe and left lower lobe favoring atypical infectious bronchiolitis. There are some new (from 3 months ago) nodules in the left lower lobe which are probably due to atypical infection, but which warrant surveillance imaging to exclude the unlikely possibility of new neoplastic nodules. 5. 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 6. Mild cardiomegaly. 7. Coronary, aortic arch, and branch vessel atherosclerotic vascular disease. Aortic valve calcification. 8. Hypodense pancreatic lesion measuring 2.7 by 1.9 cm, not well appreciated on prior CT abdomen from 11/26/2011. This is most likely to be an intraductal papillary mucinous neoplasm or a postinflammatory pancreatic cystic lesion, but enhancement characteristics are not assessed today. Although clearly the patient's current acute issues take precedence, follow up pancreatic protocol MRI with and without contrast would be recommended after complete resolution of the patient's acute situation in  order to further characterize this lesion. 9. Severe atrophy of the right kidney. 10. Abdominal aortic aneurysm, with suprarenal portion measuring up to 5.0 cm transverse. Infrarenal abdominal aortic stent grafts extending into the common iliac arteries bilaterally.  11. Moderate prostatomegaly. 12. Lumbar spondylosis and degenerative disc disease. Critical Value/emergent results were called by telephone at the time of interpretation on 08/07/2019 at 2:28 pm to provider Dr. Marjean Donna , who verbally acknowledged these results. Electronically Signed   By: Van Clines M.D.   On: 08/07/2019 14:32   DG Abd 1 View  Result Date: 08/07/2019 CLINICAL DATA:  Pneumoperitoneum, postop EXAM: ABDOMEN - 1 VIEW COMPARISON:  08/07/2019 FINDINGS: Supine frontal view of the abdomen was performed. The pelvis is excluded by collimation. Midline skin staples are seen from laparotomy. Surgical drains project over the right upper and left lower abdomen. Enteric catheter tip projects over gastric body. There is relative paucity of bowel gas. Residual pneumoperitoneum consistent with recent surgical intervention. Endoluminal stent graft within the aorta. IMPRESSION: 1. Postsurgical changes from midline laparotomy. 2. Support devices as above. Electronically Signed   By: Randa Ngo M.D.   On: 08/07/2019 22:51   CT Head Wo Contrast  Result Date: 08/07/2019 CLINICAL DATA:  Poly trauma, critical, head/cervical spine injury suspected. EXAM: CT HEAD WITHOUT CONTRAST CT CERVICAL SPINE WITHOUT CONTRAST TECHNIQUE: Multidetector CT imaging of the head and cervical spine was performed following the standard protocol without intravenous contrast. Multiplanar CT image reconstructions of the cervical spine were also generated. COMPARISON:  None. FINDINGS: CT HEAD FINDINGS Brain: Mild generalized age related parenchymal volume loss with commensurate dilatation of the ventricles and sulci. No mass, hemorrhage, edema or other evidence of acute parenchymal abnormality. No extra-axial hemorrhage. Vascular: Chronic calcified atherosclerotic changes of the large vessels at the skull base. No unexpected hyperdense vessel. Skull: Normal. Negative for fracture or focal lesion. Sinuses/Orbits: No acute  finding. Other: None. CT CERVICAL SPINE FINDINGS Alignment: Mild scoliosis. Slight reversal of the normal cervical spine lordosis. No evidence of acute vertebral body subluxation. Skull base and vertebrae: No fracture line or displaced fracture fragment seen. Facet joints are normally aligned. Soft tissues and spinal canal: No prevertebral fluid or swelling. No visible canal hematoma. Disc levels: Mild degenerative spondylosis at the C6-7 level. No significant central canal stenosis at any level. Upper chest: RIGHT apical scarring, incompletely imaged, better demonstrated on earlier chest CT of 05/08/2019. No acute findings. Other: Bilateral carotid atherosclerosis. IMPRESSION: 1. No acute intracranial abnormality. No intracranial mass, hemorrhage or edema. No skull fracture. 2. No fracture or acute subluxation within the cervical spine. Mild scoliosis and slight reversal of the normal cervical spine lordosis is likely related to patient positioning or muscle spasm. 3. Carotid atherosclerosis. Electronically Signed   By: Franki Cabot M.D.   On: 08/07/2019 14:20   CT CHEST WO CONTRAST  Result Date: 08/07/2019 CLINICAL DATA:  Abdominal pain, shortness of breath, constipation. Syncope in the bathroom today. Tachypneic. EXAM: CT CHEST, ABDOMEN AND PELVIS WITHOUT CONTRAST TECHNIQUE: Multidetector CT imaging of the chest, abdomen and pelvis was performed following the standard protocol without IV contrast. COMPARISON:  Multiple exams, including CT chest 05/08/2019 and CT abdomen 11/26/2011 FINDINGS: CT CHEST FINDINGS Cardiovascular: AICD noted. Coronary, aortic arch, and branch vessel atherosclerotic vascular disease. Mild cardiomegaly. Aortic valve calcification. Mediastinum/Nodes: Mildly distended esophagus. No adenopathy identified. Lungs/Pleura: Trace bilateral pleural effusions. Chronic right upper lobe scarring with a nodular portion measuring 1.2 by 0.9 cm on image 36/4, previously the same  on 05/08/2019.  Right Airway thickening is present, suggesting bronchitis or reactive airways disease. Mild reticulonodular opacities in the left upper lobe and left lower lobe favoring atypical infectious bronchiolitis, similar to the prior exam in general. Bronchiectatic airway plugging in the left lower lobe as on image 70/4. There is some increase in peripheral nodularity in the left lower lobe including a pleural-based 1.3 by 0.9 cm nodule on image 90/4, probably inflammatory given that this was not present 3 months ago, but surveillance is probably warranted. No pneumothorax or pneumomediastinum. Musculoskeletal: No fracture or acute bony findings identified. CT ABDOMEN PELVIS FINDINGS Hepatobiliary: Unremarkable Pancreas: Hypodense pancreatic lesion measuring 2.7 by 1.9 cm on image 69/2, not well appreciated on prior CT the abdomen from 11/26/2011. This has fluid density internally and could be intraductal papillary mucinous neoplasm or a postinflammatory pancreatic cystic lesion. Spleen: Unremarkable Adrenals/Urinary Tract: Adrenal glands normal. Severe atrophy of the right kidney. 1.5 cm in diameter exophytic fluid density lesion of the left kidney upper pole on image 50/5, likely a cyst. Similar fluid density 0.9 cm exophytic lesion from the left kidney lower pole, image 36/5. Vascular calcifications are present in the left renal hilum. Stomach/Bowel: Very large amount of free intraperitoneal gas, with additional scattered locules in the abdomen. No dilated small bowel loops. Pneumatosis and distension of the cecum, ascending colon, and transverse colon. Sigmoid colon diverticulosis. Vascular/Lymphatic: Upper abdominal aorta 5.0 cm transverse. Aneurysmal dilatation extends down into the infrarenal abdominal aorta, with a saccular aneurysmal component just below the right renal artery. Infrarenal abdominal aortic stent grafts extending into the common iliac arteries bilaterally. Extensive atherosclerosis. Reproductive:  Moderate prostatomegaly. Other: Small amount of free pelvic fluid. Stranding and fluid along the right paracolic gutter. Musculoskeletal: Lumbar spondylosis and degenerative disc disease at L5-S1. IMPRESSION: 1. Very large amount of free intraperitoneal gas with additional scattered locules in the abdomen. Pneumatosis and distension of the cecum, ascending colon, and transverse colon. Appearance suspicious for colon perforation and possible ischemia. Emergent surgical consultation recommended. 2. Extensive atherosclerosis with known abdominal aortic aneurysm. The infrarenal portion of the aneurysm has been treated with stent graft. 3. Trace bilateral pleural effusions. 4. Chronic reticulonodular opacities in the left upper lobe and left lower lobe favoring atypical infectious bronchiolitis. There are some new (from 3 months ago) nodules in the left lower lobe which are probably due to atypical infection, but which warrant surveillance imaging to exclude the unlikely possibility of new neoplastic nodules. 5. 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 6. Mild cardiomegaly. 7. Coronary, aortic arch, and branch vessel atherosclerotic vascular disease. Aortic valve calcification. 8. Hypodense pancreatic lesion measuring 2.7 by 1.9 cm, not well appreciated on prior CT abdomen from 11/26/2011. This is most likely to be an intraductal papillary mucinous neoplasm or a postinflammatory pancreatic cystic lesion, but enhancement characteristics are not assessed today. Although clearly the patient's current acute issues take precedence, follow up pancreatic protocol MRI with and without contrast would be recommended after complete resolution of the patient's acute situation in order to further characterize this lesion. 9. Severe atrophy of the right kidney. 10. Abdominal aortic aneurysm, with suprarenal portion measuring up to 5.0 cm transverse. Infrarenal abdominal aortic stent grafts extending into the common iliac  arteries bilaterally. 11. Moderate prostatomegaly. 12. Lumbar spondylosis and degenerative disc disease. Critical Value/emergent results were called by telephone at the time of interpretation on 08/07/2019 at 2:28 pm to provider Dr. Marjean Donna , who verbally acknowledged these results. Electronically Signed  By: Van Clines M.D.   On: 08/07/2019 14:32   CT Cervical Spine Wo Contrast  Result Date: 08/07/2019 CLINICAL DATA:  Poly trauma, critical, head/cervical spine injury suspected. EXAM: CT HEAD WITHOUT CONTRAST CT CERVICAL SPINE WITHOUT CONTRAST TECHNIQUE: Multidetector CT imaging of the head and cervical spine was performed following the standard protocol without intravenous contrast. Multiplanar CT image reconstructions of the cervical spine were also generated. COMPARISON:  None. FINDINGS: CT HEAD FINDINGS Brain: Mild generalized age related parenchymal volume loss with commensurate dilatation of the ventricles and sulci. No mass, hemorrhage, edema or other evidence of acute parenchymal abnormality. No extra-axial hemorrhage. Vascular: Chronic calcified atherosclerotic changes of the large vessels at the skull base. No unexpected hyperdense vessel. Skull: Normal. Negative for fracture or focal lesion. Sinuses/Orbits: No acute finding. Other: None. CT CERVICAL SPINE FINDINGS Alignment: Mild scoliosis. Slight reversal of the normal cervical spine lordosis. No evidence of acute vertebral body subluxation. Skull base and vertebrae: No fracture line or displaced fracture fragment seen. Facet joints are normally aligned. Soft tissues and spinal canal: No prevertebral fluid or swelling. No visible canal hematoma. Disc levels: Mild degenerative spondylosis at the C6-7 level. No significant central canal stenosis at any level. Upper chest: RIGHT apical scarring, incompletely imaged, better demonstrated on earlier chest CT of 05/08/2019. No acute findings. Other: Bilateral carotid atherosclerosis. IMPRESSION:  1. No acute intracranial abnormality. No intracranial mass, hemorrhage or edema. No skull fracture. 2. No fracture or acute subluxation within the cervical spine. Mild scoliosis and slight reversal of the normal cervical spine lordosis is likely related to patient positioning or muscle spasm. 3. Carotid atherosclerosis. Electronically Signed   By: Franki Cabot M.D.   On: 08/07/2019 14:20   DG Chest Port 1 View  Result Date: 08/08/2019 CLINICAL DATA:  Central line placement EXAM: PORTABLE CHEST 1 VIEW COMPARISON:  08/07/2019 FINDINGS: Endotracheal tube at the thoracic inlet, 10 cm above the carina. Right IJ venous catheter terminates in the proximal SVC. Enteric tube courses into the stomach. Lungs are essentially clear.  No pleural effusion or pneumothorax. The heart is normal in size. Left subclavian pacemaker. Thoracic aortic atherosclerosis. IMPRESSION: Endotracheal tube terminates 10 cm above the carina. Right IJ venous catheter terminates in the proximal SVC. Enteric tube courses into the stomach. Electronically Signed   By: Julian Hy M.D.   On: 08/08/2019 01:14   DG Chest Port 1 View  Result Date: 08/07/2019 CLINICAL DATA:  Intubated, syncope, pneumoperitoneum EXAM: PORTABLE CHEST 1 VIEW COMPARISON:  08/07/2019 FINDINGS: 2 frontal views endotracheal tube overlying of the chest demonstrate tracheal air column tip at level of thoracic inlet. Enteric catheter passes below diaphragm tip excluded by collimation. Dual lead pacer/AICD unchanged. Numerous cardiac leads overlie the chest. Cardiac silhouette is unremarkable. No airspace disease, effusion, or pneumothorax. No acute bony abnormalities. IMPRESSION: 1. Support devices as above. 2. No acute intrathoracic process. Electronically Signed   By: Randa Ngo M.D.   On: 08/07/2019 22:49     Assessment and Recommendation  79 y.o. male with known chronic systolic dysfunction congestive heart failure status post ICD placement hypertension  hyperlipidemia status post sepsis from a bowel perforation and surgical intervention relatively stable without evidence of myocardial infarction or congestive heart failure at this time recovering fairly well 1.  Continue supportive care of sepsis surgical intervention and possible hypoxia 2.  Slow reinstatement of medication management after recovery from above for cardiovascular disease cardiomyopathy and congestive heart failure 3.  No further cardiac diagnostics necessary  at this time due to cardiac stability  Signed, Serafina Royals M.D. FACC

## 2019-08-09 NOTE — Progress Notes (Signed)
PT Cancellation Note  Patient Details Name: Phillip Allison. MRN: [id number] DOB: 1941/01/18   Cancelled Treatment:    Reason Eval/Treat Not Completed: Other (comment)(PT consult received, chart reviewed. Per chart and RN pt extubated today 08/09/19. PT to hold evaluation this PM to allow further assessment/stability of respiratory status prior to exertional activity.) Will re-attempt next date pending medically appropriate.   Lieutenant Diego PT, DPT 3:19 PM,08/09/19

## 2019-08-10 DIAGNOSIS — I4891 Unspecified atrial fibrillation: Secondary | ICD-10-CM

## 2019-08-10 DIAGNOSIS — R55 Syncope and collapse: Secondary | ICD-10-CM

## 2019-08-10 DIAGNOSIS — C189 Malignant neoplasm of colon, unspecified: Secondary | ICD-10-CM

## 2019-08-10 DIAGNOSIS — K631 Perforation of intestine (nontraumatic): Secondary | ICD-10-CM

## 2019-08-10 DIAGNOSIS — Z87891 Personal history of nicotine dependence: Secondary | ICD-10-CM

## 2019-08-10 DIAGNOSIS — N170 Acute kidney failure with tubular necrosis: Secondary | ICD-10-CM

## 2019-08-10 DIAGNOSIS — N183 Chronic kidney disease, stage 3 unspecified: Secondary | ICD-10-CM

## 2019-08-10 LAB — CULTURE, RESPIRATORY W GRAM STAIN: Special Requests: NORMAL

## 2019-08-10 LAB — LEGIONELLA PNEUMOPHILA SEROGP 1 UR AG: L. pneumophila Serogp 1 Ur Ag: NEGATIVE

## 2019-08-10 LAB — COMPREHENSIVE METABOLIC PANEL
ALT: 155 U/L — ABNORMAL HIGH (ref 0–44)
AST: 76 U/L — ABNORMAL HIGH (ref 15–41)
Albumin: 2.1 g/dL — ABNORMAL LOW (ref 3.5–5.0)
Alkaline Phosphatase: 40 U/L (ref 38–126)
Anion gap: 9 (ref 5–15)
BUN: 68 mg/dL — ABNORMAL HIGH (ref 8–23)
CO2: 27 mmol/L (ref 22–32)
Calcium: 8.3 mg/dL — ABNORMAL LOW (ref 8.9–10.3)
Chloride: 110 mmol/L (ref 98–111)
Creatinine, Ser: 2.17 mg/dL — ABNORMAL HIGH (ref 0.61–1.24)
GFR calc Af Amer: 33 mL/min — ABNORMAL LOW (ref >60)
GFR calc non Af Amer: 28 mL/min — ABNORMAL LOW (ref >60)
Glucose, Bld: 134 mg/dL — ABNORMAL HIGH (ref 70–99)
Potassium: 3.9 mmol/L (ref 3.5–5.1)
Sodium: 146 mmol/L — ABNORMAL HIGH (ref 135–145)
Total Bilirubin: 0.8 mg/dL (ref 0.3–1.2)
Total Protein: 4.8 g/dL — ABNORMAL LOW (ref 6.5–8.1)

## 2019-08-10 LAB — FIBRIN DERIVATIVES D-DIMER (ARMC ONLY): Fibrin derivatives D-dimer (ARMC): 4808.55 ng/mL (FEU) — ABNORMAL HIGH (ref 0.00–499.00)

## 2019-08-10 LAB — GLUCOSE, CAPILLARY
Glucose-Capillary: 115 mg/dL — ABNORMAL HIGH (ref 70–99)
Glucose-Capillary: 116 mg/dL — ABNORMAL HIGH (ref 70–99)
Glucose-Capillary: 123 mg/dL — ABNORMAL HIGH (ref 70–99)
Glucose-Capillary: 127 mg/dL — ABNORMAL HIGH (ref 70–99)
Glucose-Capillary: 131 mg/dL — ABNORMAL HIGH (ref 70–99)
Glucose-Capillary: 94 mg/dL (ref 70–99)

## 2019-08-10 LAB — CBC
HCT: 33.8 % — ABNORMAL LOW (ref 39.0–52.0)
Hemoglobin: 11 g/dL — ABNORMAL LOW (ref 13.0–17.0)
MCH: 28.6 pg (ref 26.0–34.0)
MCHC: 32.5 g/dL (ref 30.0–36.0)
MCV: 87.8 fL (ref 80.0–100.0)
Platelets: 188 10*3/uL (ref 150–400)
RBC: 3.85 MIL/uL — ABNORMAL LOW (ref 4.22–5.81)
RDW: 15.4 % (ref 11.5–15.5)
WBC: 15.6 10*3/uL — ABNORMAL HIGH (ref 4.0–10.5)
nRBC: 0 % (ref 0.0–0.2)

## 2019-08-10 LAB — MAGNESIUM: Magnesium: 2.1 mg/dL (ref 1.7–2.4)

## 2019-08-10 LAB — C-REACTIVE PROTEIN: CRP: 26.7 mg/dL — ABNORMAL HIGH (ref <1.0)

## 2019-08-10 LAB — FERRITIN: Ferritin: 177 ng/mL (ref 24–336)

## 2019-08-10 MED ORDER — SERTRALINE HCL 50 MG PO TABS
50.0000 mg | ORAL_TABLET | Freq: Every day | ORAL | Status: DC
  Administered 2019-08-10 – 2019-08-14 (×5): 50 mg via ORAL
  Filled 2019-08-10 (×5): qty 1

## 2019-08-10 MED ORDER — PRAVASTATIN SODIUM 20 MG PO TABS
40.0000 mg | ORAL_TABLET | Freq: Every day | ORAL | Status: DC
  Administered 2019-08-10 – 2019-08-13 (×4): 40 mg via ORAL
  Filled 2019-08-10 (×4): qty 2

## 2019-08-10 MED ORDER — AMIODARONE HCL 200 MG PO TABS
200.0000 mg | ORAL_TABLET | Freq: Two times a day (BID) | ORAL | Status: DC
  Administered 2019-08-10 – 2019-08-12 (×5): 200 mg via ORAL
  Filled 2019-08-10 (×4): qty 1

## 2019-08-10 MED ORDER — CARVEDILOL 3.125 MG PO TABS
6.2500 mg | ORAL_TABLET | Freq: Two times a day (BID) | ORAL | Status: DC
  Administered 2019-08-10 – 2019-08-12 (×4): 6.25 mg via ORAL
  Administered 2019-08-12: 09:00:00 3.125 mg via ORAL
  Administered 2019-08-13 – 2019-08-14 (×4): 6.25 mg via ORAL
  Filled 2019-08-10: qty 2
  Filled 2019-08-10: qty 1
  Filled 2019-08-10 (×3): qty 2
  Filled 2019-08-10: qty 1
  Filled 2019-08-10 (×3): qty 2

## 2019-08-10 NOTE — Progress Notes (Signed)
Patient ID: Phillip Allison., male   DOB: 07-26-1940, 79 y.o.   MRN: [id number] [place] [facility] [place] NAME: Phillip Allison    MR#:  [id number]  DATE OF BIRTH:  1941-06-15  SUBJECTIVE:  Assumed care for this  Patient form today for medical problems.  Patient extubated yesterday. Overall doing well. Some abdominal pain at the surgical site. NG clamping trial. Went into a fib last night on amiodarone drip no fever  REVIEW OF SYSTEMS:   Review of Systems  Constitutional: Positive for malaise/fatigue. Negative for chills, fever and weight loss.  HENT: Negative for ear discharge, ear pain and nosebleeds.   Eyes: Negative for blurred vision, pain and discharge.  Respiratory: Negative for sputum production, shortness of breath, wheezing and stridor.   Cardiovascular: Negative for chest pain, palpitations, orthopnea and PND.  Gastrointestinal: Positive for abdominal pain. Negative for diarrhea, nausea and vomiting.  Genitourinary: Negative for frequency and urgency.  Musculoskeletal: Negative for back pain and joint pain.  Neurological: Positive for weakness. Negative for sensory change, speech change and focal weakness.  Psychiatric/Behavioral: Negative for depression and hallucinations. The patient is not nervous/anxious.    Tolerating Diet: Tolerating PT:   DRUG ALLERGIES:  No Known Allergies  VITALS:  Blood pressure (!) 144/65, pulse 85, temperature 98.2 F (36.8 C), temperature source Axillary, resp. rate 16, height 5\' 7"  (1.702 m), weight 85.4 kg, SpO2 97 %.  PHYSICAL EXAMINATION:   Physical Exam  GENERAL:  79 y.o.-year-old patient lying in the bed with no acute distress. critically ill EYES: Pupils equal, round, reactive to light and accommodation. No scleral icterus.   HEENT: Head atraumatic, normocephalic. Oropharynx and nasopharynx clear.  NECK:  Supple, no jugular venous distention. No thyroid enlargement, no  tenderness.  LUNGS: decreased breath sounds bilaterally, no wheezing, rales, rhonchi. No use of accessory muscles of respiration.  CARDIOVASCULAR: S1, S2 normal. No murmurs, rubs, or gallops. tachycardia ABDOMEN: Soft, nontender, nondistended. Bowel sounds present. No organomegaly or mass. Surgical dressing+ EXTREMITIES: No cyanosis, clubbing or edema b/l.    NEUROLOGIC: Cranial nerves II through XII are intact. No focal Motor or sensory deficits b/l.  weak PSYCHIATRIC:  patient is alert and oriented x 3.  SKIN: No obvious rash, lesion, or ulcer.   LABORATORY PANEL:  CBC Recent Labs  Lab 08/10/19 0420  WBC 15.6*  HGB 11.0*  HCT 33.8*  PLT 188    Chemistries  Recent Labs  Lab 08/10/19 0420  NA 146*  K 3.9  CL 110  CO2 27  GLUCOSE 134*  BUN 68*  CREATININE 2.17*  CALCIUM 8.3*  MG 2.1  AST 76*  ALT 155*  ALKPHOS 40  BILITOT 0.8   Cardiac Enzymes No results for input(s): TROPONINI in the last 168 hours. RADIOLOGY:  No results found. ASSESSMENT AND PLAN:  [name] with PMH of COPD on 2L [place], HFrEF, CAD,andCKD III admitted 2/13/21with severepneumoperitoneumand perforated viscus where he was taken foremergency exploratory Laparotomy.He required subtotal colectomy with end ileostomy  Acute hypoxic/hypercarbic respiratory failure from sepsis with perforated viscous and COVID-19 infection -patient presented February 13 with severe  pneumoperitoneum underwent emergent exploratory laparotomy requiring subtotal colectomy with and ileostomy by Dr. Hampton Abbot -NG clamping being done-- if no residual will be removed later today -continue IV fluids -PRN pain meds by surgery  Pneumoperitoneum due to Large bowel obstruction -found to have severe stricture of sigmoid colon and prelim path results showed Invasive adenocarcinoma --Oncology consulted  Severe sepsis secondary to perforated viscous present on admission with acute renal failure/ATN with h/o CKD III Baseline creat  1.7 -creat 2.30--2.17 -good urine output -IV fluids -IV Zosyn -blood culture negative in three days  Pneumonia secondary to covid-19 infection -on IV Remdesivir (day 2/5) and decadron -continue oxygen and wean as tolerated -as needed bronchodilator  A fib with RVR, acute -cardiology consultation with Dr. Nehemiah Massed -patient started on IV amiodarone drip-- change to oral amiodarone 200 BID when able to swallow -resume po coreg (home med)  Congestive heart failure chronic systolic and CAD -EF of 47% -Lasix held due to elevated creatinine -coreg, pravastatin -Enteresto on hold (high creat)  History of COPD on chronic home oxygen 2 L -bronchodilators as needed -currently on steroids  Depression Cont zoloft  CODE STATUS: DNR DVT Prophylaxis :heparin   TOTAL TIME TAKING CARE OF THIS PATIENT: *30* minutes.  >50% time spent on counselling and coordination of care  Note: This dictation was prepared with Dragon dictation along with smaller phrase technology. Any transcriptional errors that result from this process are unintentional.  Fritzi Mandes M.D    [facility]   CC: Primary care physician; Jerrol Banana., MD

## 2019-08-10 NOTE — Progress Notes (Signed)
[facility] Encounter Note  Patient: Phillip Allison. / Admit Date: 08/07/2019 / Date of Encounter: 08/10/2019, 8:54 AM   Subjective: Patient appears to have normal sinus rhythm and/or atrial pacing with bundle branch block at this time with no evidence of significant rhythm disturbances throughout the evening.  The patient did have a run of atrial fibrillation with rapid ventricular rate with bundle branch block now resolved with amiodarone drip.    Troponin level slightly elevated most consistent with demand ischemia sepsis and possible surgical intervention.  Chest x-ray has appeared to be relatively normal and no evidence of pneumonia Patient previously has had known cardiomyopathy LV systolic dysfunction with ejection fraction of 20% ICD placement and peripheral vascular disease which appear to be stable and patient has tolerated surgical intervention relatively well but still in critical condition trying to recover from above   Objective: Telemetry: Normal sinus rhythm Physical Exam: Blood pressure (!) 153/111, pulse (!) 107, temperature 98.8 F (37.1 C), temperature source Oral, resp. rate (!) 22, height '5\' 7"'$  (1.702 m), weight 85.4 kg, SpO2 98 %. Body mass index is 29.49 kg/m. As per critical care  Intake/Output Summary (Last 24 hours) at 08/10/2019 0854 Last data filed at 08/10/2019 0600 Gross per 24 hour  Intake 4399.32 ml  Output 3055 ml  Net 1344.32 ml    Inpatient Medications:  . chlorhexidine gluconate (MEDLINE KIT)  15 mL Mouth Rinse BID  . Chlorhexidine Gluconate Cloth  6 each Topical Daily  . dexamethasone (DECADRON) injection  6 mg Intravenous Q24H  . fentaNYL (SUBLIMAZE) injection  25 mcg Intravenous Once  . heparin  5,000 Units Subcutaneous Q8H  . insulin aspart  0-15 Units Subcutaneous Q4H  . pantoprazole (PROTONIX) IV  40 mg Intravenous QHS   Infusions:  . amiodarone 30 mg/hr (08/10/19 0600)  . dexmedetomidine (PRECEDEX) IV  infusion 0.4 mcg/kg/hr (08/09/19 0400)  . fentaNYL infusion INTRAVENOUS Stopped (08/08/19 1511)  . lactated ringers 100 mL/hr at 08/10/19 0600  . norepinephrine (LEVOPHED) Adult infusion 2 mcg/min (08/09/19 0110)  . piperacillin-tazobactam (ZOSYN)  IV 12.5 mL/hr at 08/10/19 0600  . remdesivir 100 mg in NS 100 mL Stopped (08/09/19 1032)    Labs: Recent Labs    08/08/19 0119 08/08/19 0119 08/09/19 0440 08/10/19 0420  NA 140   < > 141 146*  K 3.9   < > 3.8 3.9  CL 107   < > 110 110  CO2 24   < > 21* 27  GLUCOSE 127*   < > 123* 134*  BUN 63*   < > 71* 68*  CREATININE 2.35*   < > 2.27* 2.17*  CALCIUM 8.2*   < > 8.2* 8.3*  MG 2.3  --   --  2.1   < > = values in this interval not displayed.   Recent Labs    08/09/19 0440 08/10/19 0420  AST 153* 76*  ALT 183* 155*  ALKPHOS 47 40  BILITOT 0.7 0.8  PROT 5.3* 4.8*  ALBUMIN 2.4* 2.1*   Recent Labs    08/07/19 1220 08/07/19 2201 08/09/19 0440 08/10/19 0420  WBC 30.5*   < > 17.7* 15.6*  NEUTROABS 23.4*  --   --   --   HGB 13.4   < > 10.9* 11.0*  HCT 42.8   < > 33.5* 33.8*  MCV 90.5   < > 87.5 87.8  PLT 329   < > 185 188   < > = values in this  interval not displayed.   No results for input(s): CKTOTAL, CKMB, TROPONINI in the last 72 hours. Invalid input(s): POCBNP Recent Labs    08/07/19 2201  HGBA1C 5.8*     Weights: Filed Weights   08/07/19 1233 08/08/19 0200  Weight: 79.4 kg 85.4 kg     Radiology/Studies:  CT ABDOMEN PELVIS WO CONTRAST  Result Date: 08/07/2019 CLINICAL DATA:  Abdominal pain, shortness of breath, constipation. Syncope in the bathroom today. Tachypneic. EXAM: CT CHEST, ABDOMEN AND PELVIS WITHOUT CONTRAST TECHNIQUE: Multidetector CT imaging of the chest, abdomen and pelvis was performed following the standard protocol without IV contrast. COMPARISON:  Multiple exams, including CT chest 05/08/2019 and CT abdomen 11/26/2011 FINDINGS: CT CHEST FINDINGS Cardiovascular: AICD noted. Coronary, aortic  arch, and branch vessel atherosclerotic vascular disease. Mild cardiomegaly. Aortic valve calcification. Mediastinum/Nodes: Mildly distended esophagus. No adenopathy identified. Lungs/Pleura: Trace bilateral pleural effusions. Chronic right upper lobe scarring with a nodular portion measuring 1.2 by 0.9 cm on image 36/4, previously the same on 05/08/2019. Right Airway thickening is present, suggesting bronchitis or reactive airways disease. Mild reticulonodular opacities in the left upper lobe and left lower lobe favoring atypical infectious bronchiolitis, similar to the prior exam in general. Bronchiectatic airway plugging in the left lower lobe as on image 70/4. There is some increase in peripheral nodularity in the left lower lobe including a pleural-based 1.3 by 0.9 cm nodule on image 90/4, probably inflammatory given that this was not present 3 months ago, but surveillance is probably warranted. No pneumothorax or pneumomediastinum. Musculoskeletal: No fracture or acute bony findings identified. CT ABDOMEN PELVIS FINDINGS Hepatobiliary: Unremarkable Pancreas: Hypodense pancreatic lesion measuring 2.7 by 1.9 cm on image 69/2, not well appreciated on prior CT the abdomen from 11/26/2011. This has fluid density internally and could be intraductal papillary mucinous neoplasm or a postinflammatory pancreatic cystic lesion. Spleen: Unremarkable Adrenals/Urinary Tract: Adrenal glands normal. Severe atrophy of the right kidney. 1.5 cm in diameter exophytic fluid density lesion of the left kidney upper pole on image 50/5, likely a cyst. Similar fluid density 0.9 cm exophytic lesion from the left kidney lower pole, image 36/5. Vascular calcifications are present in the left renal hilum. Stomach/Bowel: Very large amount of free intraperitoneal gas, with additional scattered locules in the abdomen. No dilated small bowel loops. Pneumatosis and distension of the cecum, ascending colon, and transverse colon. Sigmoid colon  diverticulosis. Vascular/Lymphatic: Upper abdominal aorta 5.0 cm transverse. Aneurysmal dilatation extends down into the infrarenal abdominal aorta, with a saccular aneurysmal component just below the right renal artery. Infrarenal abdominal aortic stent grafts extending into the common iliac arteries bilaterally. Extensive atherosclerosis. Reproductive: Moderate prostatomegaly. Other: Small amount of free pelvic fluid. Stranding and fluid along the right paracolic gutter. Musculoskeletal: Lumbar spondylosis and degenerative disc disease at L5-S1. IMPRESSION: 1. Very large amount of free intraperitoneal gas with additional scattered locules in the abdomen. Pneumatosis and distension of the cecum, ascending colon, and transverse colon. Appearance suspicious for colon perforation and possible ischemia. Emergent surgical consultation recommended. 2. Extensive atherosclerosis with known abdominal aortic aneurysm. The infrarenal portion of the aneurysm has been treated with stent graft. 3. Trace bilateral pleural effusions. 4. Chronic reticulonodular opacities in the left upper lobe and left lower lobe favoring atypical infectious bronchiolitis. There are some new (from 3 months ago) nodules in the left lower lobe which are probably due to atypical infection, but which warrant surveillance imaging to exclude the unlikely possibility of new neoplastic nodules. 5. Bronchiectatic airway plugging in the left  lower lobe. 6. Mild cardiomegaly. 7. Coronary, aortic arch, and branch vessel atherosclerotic vascular disease. Aortic valve calcification. 8. Hypodense pancreatic lesion measuring 2.7 by 1.9 cm, not well appreciated on prior CT abdomen from 11/26/2011. This is most likely to be an intraductal papillary mucinous neoplasm or a postinflammatory pancreatic cystic lesion, but enhancement characteristics are not assessed today. Although clearly the patient's current acute issues take precedence, follow up pancreatic protocol  MRI with and without contrast would be recommended after complete resolution of the patient's acute situation in order to further characterize this lesion. 9. Severe atrophy of the right kidney. 10. Abdominal aortic aneurysm, with suprarenal portion measuring up to 5.0 cm transverse. Infrarenal abdominal aortic stent grafts extending into the common iliac arteries bilaterally. 11. Moderate prostatomegaly. 12. Lumbar spondylosis and degenerative disc disease. Critical Value/emergent results were called by telephone at the time of interpretation on 08/07/2019 at 2:28 pm to provider Dr. Marjean Donna , who verbally acknowledged these results. Electronically Signed   By: Van Clines M.D.   On: 08/07/2019 14:32   DG Abd 1 View  Result Date: 08/07/2019 CLINICAL DATA:  Pneumoperitoneum, postop EXAM: ABDOMEN - 1 VIEW COMPARISON:  08/07/2019 FINDINGS: Supine frontal view of the abdomen was performed. The pelvis is excluded by collimation. Midline skin staples are seen from laparotomy. Surgical drains project over the right upper and left lower abdomen. Enteric catheter tip projects over gastric body. There is relative paucity of bowel gas. Residual pneumoperitoneum consistent with recent surgical intervention. Endoluminal stent graft within the aorta. IMPRESSION: 1. Postsurgical changes from midline laparotomy. 2. Support devices as above. Electronically Signed   By: Randa Ngo M.D.   On: 08/07/2019 22:51   CT Head Wo Contrast  Result Date: 08/07/2019 CLINICAL DATA:  Poly trauma, critical, head/cervical spine injury suspected. EXAM: CT HEAD WITHOUT CONTRAST CT CERVICAL SPINE WITHOUT CONTRAST TECHNIQUE: Multidetector CT imaging of the head and cervical spine was performed following the standard protocol without intravenous contrast. Multiplanar CT image reconstructions of the cervical spine were also generated. COMPARISON:  None. FINDINGS: CT HEAD FINDINGS Brain: Mild generalized age related parenchymal volume  loss with commensurate dilatation of the ventricles and sulci. No mass, hemorrhage, edema or other evidence of acute parenchymal abnormality. No extra-axial hemorrhage. Vascular: Chronic calcified atherosclerotic changes of the large vessels at the skull base. No unexpected hyperdense vessel. Skull: Normal. Negative for fracture or focal lesion. Sinuses/Orbits: No acute finding. Other: None. CT CERVICAL SPINE FINDINGS Alignment: Mild scoliosis. Slight reversal of the normal cervical spine lordosis. No evidence of acute vertebral body subluxation. Skull base and vertebrae: No fracture line or displaced fracture fragment seen. Facet joints are normally aligned. Soft tissues and spinal canal: No prevertebral fluid or swelling. No visible canal hematoma. Disc levels: Mild degenerative spondylosis at the C6-7 level. No significant central canal stenosis at any level. Upper chest: RIGHT apical scarring, incompletely imaged, better demonstrated on earlier chest CT of 05/08/2019. No acute findings. Other: Bilateral carotid atherosclerosis. IMPRESSION: 1. No acute intracranial abnormality. No intracranial mass, hemorrhage or edema. No skull fracture. 2. No fracture or acute subluxation within the cervical spine. Mild scoliosis and slight reversal of the normal cervical spine lordosis is likely related to patient positioning or muscle spasm. 3. Carotid atherosclerosis. Electronically Signed   By: Franki Cabot M.D.   On: 08/07/2019 14:20   CT CHEST WO CONTRAST  Result Date: 08/07/2019 CLINICAL DATA:  Abdominal pain, shortness of breath, constipation. Syncope in the bathroom today. Tachypneic. EXAM: CT  CHEST, ABDOMEN AND PELVIS WITHOUT CONTRAST TECHNIQUE: Multidetector CT imaging of the chest, abdomen and pelvis was performed following the standard protocol without IV contrast. COMPARISON:  Multiple exams, including CT chest 05/08/2019 and CT abdomen 11/26/2011 FINDINGS: CT CHEST FINDINGS Cardiovascular: AICD noted.  Coronary, aortic arch, and branch vessel atherosclerotic vascular disease. Mild cardiomegaly. Aortic valve calcification. Mediastinum/Nodes: Mildly distended esophagus. No adenopathy identified. Lungs/Pleura: Trace bilateral pleural effusions. Chronic right upper lobe scarring with a nodular portion measuring 1.2 by 0.9 cm on image 36/4, previously the same on 05/08/2019. Right Airway thickening is present, suggesting bronchitis or reactive airways disease. Mild reticulonodular opacities in the left upper lobe and left lower lobe favoring atypical infectious bronchiolitis, similar to the prior exam in general. Bronchiectatic airway plugging in the left lower lobe as on image 70/4. There is some increase in peripheral nodularity in the left lower lobe including a pleural-based 1.3 by 0.9 cm nodule on image 90/4, probably inflammatory given that this was not present 3 months ago, but surveillance is probably warranted. No pneumothorax or pneumomediastinum. Musculoskeletal: No fracture or acute bony findings identified. CT ABDOMEN PELVIS FINDINGS Hepatobiliary: Unremarkable Pancreas: Hypodense pancreatic lesion measuring 2.7 by 1.9 cm on image 69/2, not well appreciated on prior CT the abdomen from 11/26/2011. This has fluid density internally and could be intraductal papillary mucinous neoplasm or a postinflammatory pancreatic cystic lesion. Spleen: Unremarkable Adrenals/Urinary Tract: Adrenal glands normal. Severe atrophy of the right kidney. 1.5 cm in diameter exophytic fluid density lesion of the left kidney upper pole on image 50/5, likely a cyst. Similar fluid density 0.9 cm exophytic lesion from the left kidney lower pole, image 36/5. Vascular calcifications are present in the left renal hilum. Stomach/Bowel: Very large amount of free intraperitoneal gas, with additional scattered locules in the abdomen. No dilated small bowel loops. Pneumatosis and distension of the cecum, ascending colon, and transverse colon.  Sigmoid colon diverticulosis. Vascular/Lymphatic: Upper abdominal aorta 5.0 cm transverse. Aneurysmal dilatation extends down into the infrarenal abdominal aorta, with a saccular aneurysmal component just below the right renal artery. Infrarenal abdominal aortic stent grafts extending into the common iliac arteries bilaterally. Extensive atherosclerosis. Reproductive: Moderate prostatomegaly. Other: Small amount of free pelvic fluid. Stranding and fluid along the right paracolic gutter. Musculoskeletal: Lumbar spondylosis and degenerative disc disease at L5-S1. IMPRESSION: 1. Very large amount of free intraperitoneal gas with additional scattered locules in the abdomen. Pneumatosis and distension of the cecum, ascending colon, and transverse colon. Appearance suspicious for colon perforation and possible ischemia. Emergent surgical consultation recommended. 2. Extensive atherosclerosis with known abdominal aortic aneurysm. The infrarenal portion of the aneurysm has been treated with stent graft. 3. Trace bilateral pleural effusions. 4. Chronic reticulonodular opacities in the left upper lobe and left lower lobe favoring atypical infectious bronchiolitis. There are some new (from 3 months ago) nodules in the left lower lobe which are probably due to atypical infection, but which warrant surveillance imaging to exclude the unlikely possibility of new neoplastic nodules. 5. 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 6. Mild cardiomegaly. 7. Coronary, aortic arch, and branch vessel atherosclerotic vascular disease. Aortic valve calcification. 8. Hypodense pancreatic lesion measuring 2.7 by 1.9 cm, not well appreciated on prior CT abdomen from 11/26/2011. This is most likely to be an intraductal papillary mucinous neoplasm or a postinflammatory pancreatic cystic lesion, but enhancement characteristics are not assessed today. Although clearly the patient's current acute issues take precedence, follow up  pancreatic protocol MRI with and without contrast would be  recommended after complete resolution of the patient's acute situation in order to further characterize this lesion. 9. Severe atrophy of the right kidney. 10. Abdominal aortic aneurysm, with suprarenal portion measuring up to 5.0 cm transverse. Infrarenal abdominal aortic stent grafts extending into the common iliac arteries bilaterally. 11. Moderate prostatomegaly. 12. Lumbar spondylosis and degenerative disc disease. Critical Value/emergent results were called by telephone at the time of interpretation on 08/07/2019 at 2:28 pm to provider Dr. Marjean Donna , who verbally acknowledged these results. Electronically Signed   By: Van Clines M.D.   On: 08/07/2019 14:32   CT Cervical Spine Wo Contrast  Result Date: 08/07/2019 CLINICAL DATA:  Poly trauma, critical, head/cervical spine injury suspected. EXAM: CT HEAD WITHOUT CONTRAST CT CERVICAL SPINE WITHOUT CONTRAST TECHNIQUE: Multidetector CT imaging of the head and cervical spine was performed following the standard protocol without intravenous contrast. Multiplanar CT image reconstructions of the cervical spine were also generated. COMPARISON:  None. FINDINGS: CT HEAD FINDINGS Brain: Mild generalized age related parenchymal volume loss with commensurate dilatation of the ventricles and sulci. No mass, hemorrhage, edema or other evidence of acute parenchymal abnormality. No extra-axial hemorrhage. Vascular: Chronic calcified atherosclerotic changes of the large vessels at the skull base. No unexpected hyperdense vessel. Skull: Normal. Negative for fracture or focal lesion. Sinuses/Orbits: No acute finding. Other: None. CT CERVICAL SPINE FINDINGS Alignment: Mild scoliosis. Slight reversal of the normal cervical spine lordosis. No evidence of acute vertebral body subluxation. Skull base and vertebrae: No fracture line or displaced fracture fragment seen. Facet joints are normally aligned. Soft tissues  and spinal canal: No prevertebral fluid or swelling. No visible canal hematoma. Disc levels: Mild degenerative spondylosis at the C6-7 level. No significant central canal stenosis at any level. Upper chest: RIGHT apical scarring, incompletely imaged, better demonstrated on earlier chest CT of 05/08/2019. No acute findings. Other: Bilateral carotid atherosclerosis. IMPRESSION: 1. No acute intracranial abnormality. No intracranial mass, hemorrhage or edema. No skull fracture. 2. No fracture or acute subluxation within the cervical spine. Mild scoliosis and slight reversal of the normal cervical spine lordosis is likely related to patient positioning or muscle spasm. 3. Carotid atherosclerosis. Electronically Signed   By: Franki Cabot M.D.   On: 08/07/2019 14:20   DG Chest Port 1 View  Result Date: 08/08/2019 CLINICAL DATA:  Central line placement EXAM: PORTABLE CHEST 1 VIEW COMPARISON:  08/07/2019 FINDINGS: Endotracheal tube at the thoracic inlet, 10 cm above the carina. Right IJ venous catheter terminates in the proximal SVC. Enteric tube courses into the stomach. Lungs are essentially clear.  No pleural effusion or pneumothorax. The heart is normal in size. Left subclavian pacemaker. Thoracic aortic atherosclerosis. IMPRESSION: Endotracheal tube terminates 10 cm above the carina. Right IJ venous catheter terminates in the proximal SVC. Enteric tube courses into the stomach. Electronically Signed   By: Julian Hy M.D.   On: 08/08/2019 01:14   DG Chest Port 1 View  Result Date: 08/07/2019 CLINICAL DATA:  Intubated, syncope, pneumoperitoneum EXAM: PORTABLE CHEST 1 VIEW COMPARISON:  08/07/2019 FINDINGS: 2 frontal views endotracheal tube overlying of the chest demonstrate tracheal air column tip at level of thoracic inlet. Enteric catheter passes below diaphragm tip excluded by collimation. Dual lead pacer/AICD unchanged. Numerous cardiac leads overlie the chest. Cardiac silhouette is unremarkable. No  airspace disease, effusion, or pneumothorax. No acute bony abnormalities. IMPRESSION: 1. Support devices as above. 2. No acute intrathoracic process. Electronically Signed   By: Randa Ngo M.D.   On: 08/07/2019 22:49  Assessment and Recommendation  79 y.o. male with known chronic systolic dysfunction congestive heart failure status post ICD placement hypertension hyperlipidemia status post sepsis from a bowel perforation and surgical intervention relatively stable without evidence of myocardial infarction or congestive heart failure at this time with paroxysmal nonvalvular atrial fibrillation which he has had before but now improved with amiodarone infusion 1.  Continue supportive care of sepsis surgical intervention and possible hypoxia 2.  Slow reinstatement of medication management after recovery from above for cardiovascular disease cardiomyopathy and congestive heart failure 3.  No further cardiac diagnostics necessary at this time due to cardiac stability 4.  Continue amiodarone infusion for 48 hours and/or when able thereafter to switch to oral amiodarone for maintenance of normal sinus rhythm 5.  Heparin for DVT and or risk reduction of stroke with atrial fibrillation  Signed, Serafina Royals M.D. FACC

## 2019-08-10 NOTE — Progress Notes (Signed)
Phillip Allison Day(s): 3.   Post op day(s): 3 Days Post-Op.   Interval History: Patient seen and examined, had issues with tachycardia and atrial fibrillation and was started on Amiodarone. At time of my examination, he has converted to NSR. Patient reports that he is feeling better this morning aside from incisional soreness. No fever, chills, nausea, or emesis. NGT output is minimal and he has started to have stool and gas from the ileostomy. Leukocytosis improved to 15.6K. Renal function improved, sCr 2.17 and U/O - 2.8L in last 24 hours. JP drains with 65 and 55 ccs respectively. No further issues.   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, lymph nod involvement pending  Vital signs in last 24 hours: [min-max] current  Temp:  [97.5 F (36.4 C)-98.8 F (37.1 C)] 98.8 F (37.1 C) (02/16 0730) Pulse Rate:  [59-142] 107 (02/16 0730) Resp:  [13-24] 22 (02/16 0730) BP: (97-153)/(47-130) 153/111 (02/16 0730) SpO2:  [92 %-99 %] 98 % (02/16 0730) FiO2 (%):  [30 %] 30 % (02/15 1037)     Height: 5\' 7"  (170.2 cm) Weight: 85.4 kg BMI (Calculated): 29.48   Intake/Output last 2 shifts:  02/15 0701 - 02/16 0700 In: 4639.5 [I.V.:2640.3; NG/GT:30; IV Piggyback:274.2] Out: 3055 [Urine:2885; Drains:120; Stool:50]   Physical Exam:  Constitutional: alert, cooperative and no distress  HEENT: NGT in place Respiratory: breathing non-labored at rest  Cardiovascular: regular rate and sinus rhythm  Gastrointestinal: soft, incisional soreness, and non-distended, no rebound/guarding. 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 JP drains in both LLQ and RLQ with serosanguinous output Genitourinary: Foley present, making clear urine Integumentary: Laparotomy incision is CDI with staples and honeycomb, no appreciable erythema   Labs:  CBC Latest Ref Rng & Units  08/10/2019 08/09/2019 08/08/2019  WBC 4.0 - 10.5 K/uL 15.6(H) 17.7(H) 26.3(H)  Hemoglobin 13.0 - 17.0 g/dL 11.0(L) 10.9(L) 12.1(L)  Hematocrit 39.0 - 52.0 % 33.8(L) 33.5(L) 38.2(L)  Platelets 150 - 400 K/uL 188 185 258   CMP Latest Ref Rng & Units 08/10/2019 08/09/2019 08/08/2019  Glucose 70 - 99 mg/dL 134(H) 123(H) 127(H)  BUN 8 - 23 mg/dL 68(H) 71(H) 63(H)  Creatinine 0.61 - 1.24 mg/dL 2.17(H) 2.27(H) 2.35(H)  Sodium 135 - 145 mmol/L 146(H) 141 140  Potassium 3.5 - 5.1 mmol/L 3.9 3.8 3.9  Chloride 98 - 111 mmol/L 110 110 107  CO2 22 - 32 mmol/L 27 21(L) 24  Calcium 8.9 - 10.3 mg/dL 8.3(L) 8.2(L) 8.2(L)  Total Protein 6.5 - 8.1 g/dL 4.8(L) 5.3(L) 5.6(L)  Total Bilirubin 0.3 - 1.2 mg/dL 0.8 0.7 0.9  Alkaline Phos 38 - 126 U/L 40 47 54  AST 15 - 41 U/L 76(H) 153(H) 225(H)  ALT 0 - 44 U/L 155(H) 183(H) 148(H)     Imaging studies: No new pertinent imaging studies   Assessment/Plan:  79 y.o. male overall doing well with improving leukocytosis and renal function now with evidence of bowel function return 3 Days Post-Op s/p exploratory laparotomy, subtotal colostomy, and creation of end ileostomy for pneumoperitoneum and large bowel obstruction most likely secondary to invasive adenocarcinoma on pathology, complicated by pertinent comorbidities including COVID +.   - We will clamp NGT now, check residuals after 4 hours. If less than 150 ccs we will remove NGT and initiate CLD   - Continue foley one more day; monitor U/O + renal function   -  NPO + IVF resuscitation             - Pain control prn; antiemetics prn             - Monitor abdominal examination; ileostomy function             - Appreciate WOC RN assistance with ileostomy care             - Will engage PT if feasible  - Consult oncology given pathology results             - Appreciate PCCM assistance; hopefully transfer to floor once medically cleared  All of the above findings and recommendations were discussed with the patient,  patient's family (son via phone), and the medical team, and all of patient's and family's questions were answered to his expressed satisfaction.  -- Phillip Simon, PA-C [facility] 08/10/2019, 8:06 AM [phone] M-F: 7am - 4pm

## 2019-08-10 NOTE — Consult Note (Signed)
Hematology/Oncology Consult note [facility] Telephone:([phone] [phone]) [phone]  Patient Care Team: Jerrol Banana., MD as PCP - General (Family Medicine) Corey Skains, MD as Consulting Physician (Cardiology)   Name of the patient: Phillip Allison  [id number]  [birth year]    Reason for consult: New diagnosis of colon cancer   Requesting physician: Dr. Hampton Abbot and Dr. Posey Pronto  Date of visit: 08/10/2019    History of presenting illness- Patient is a 79 year old gentleman who presented to the ER on 08/07/2019 with symptoms of worsening abdominal pain.  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  CT showed very large amount of free intraperitoneal gas with additional scattered locules in the abdomen.  Concern for colon perforation and possible ischemia.  Also had a CT chest without contrast which showed chronic reticulonodular opacities in the left upper lobe and lower lobe which could be infection versus malignancy.  He also tested positive for Covid pneumonia  Patient then underwent emergency exploratory laparotomy with colon resection and sampling of some lymph nodes.  He was found to have colonic stricture and pathology consistent with invasive adenocarcinoma.  Final pathology shows stage II colon cancer T3N0. 5.5 cm, 15 lymph nodes negative for malignancy moderately differentiated with negative margins.   Postoperatively his course was complicated by respiratory failure requiring intubation as well as atrial fibrillation with rapid ventricular rate and bundle branch block.  Cardiology was involved and he is currently on amiodarone drip with plans to switch him to oral amiodarone after 2 days.  He has been successfully extubated. He is presently on O2   Review of systems- Review of Systems  Constitutional: Positive for malaise/fatigue. Negative for chills, fever and weight loss.  HENT: Negative  for congestion, ear discharge and nosebleeds.   Eyes: Negative for blurred vision.  Respiratory: Positive for shortness of breath. Negative for cough, hemoptysis, sputum production and wheezing.   Cardiovascular: Negative for chest pain, palpitations, orthopnea and claudication.  Gastrointestinal: Negative for abdominal pain, blood in stool, constipation, diarrhea, heartburn, melena, nausea and vomiting.  Genitourinary: Negative for dysuria, flank pain, frequency, hematuria and urgency.  Musculoskeletal: Negative for back pain, joint pain and myalgias.  Skin: Negative for rash.  Neurological: Negative for dizziness, tingling, focal weakness, seizures, weakness and headaches.  Endo/Heme/Allergies: Does not bruise/bleed easily.  Psychiatric/Behavioral: Negative for depression and suicidal ideas. The patient does not have insomnia.     No Known Allergies  Patient Active Problem List   Diagnosis Date Noted  . Pneumoperitoneum 08/07/2019  . Colon perforation ([place])   . Hyperkalemia 05/13/2019  . Hyperphosphatemia 05/12/2019  . Hypermagnesemia 05/12/2019  . Hemoptysis 05/08/2019  . HTN (hypertension) 05/08/2019  . Depression 05/08/2019  . Acute on chronic respiratory failure with hypoxia and hypercapnia ([place]) 10/08/2017  . COPD (chronic obstructive pulmonary disease) ([place]) 03/11/2016  . History of tobacco use 03/13/2015  . History of atrial fibrillation 03/13/2015  . Chronic systolic heart failure ([place]) 03/13/2015  . Intraventricular block 03/13/2015  . 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[place]) 03/10/2015  . Cardiovascular disease 03/10/2015  . Arteriosclerosis of coronary artery 03/10/2015  . CAFL (chronic airflow limitation) ([place]) 03/10/2015  . Chronic kidney disease (CKD), stage III (moderate) 03/10/2015  . Depression, major, single episode, mild ([place]) 03/10/2015  . 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 Acid reflux 03/10/2015  . Heart & renal disease, hypertensive, with heart failure ([place])  03/10/2015  . Cardiomyopathy, ischemic 03/10/2015  . Disorder  of arteries and arterioles ([place]) 03/10/2015  . Hypercholesterolemia without hypertriglyceridemia 03/10/2015  . Cardiac defibrillator in place 01/25/2015  . MI (mitral incompetence) 11/01/2014  . Benign essential HTN 10/17/2014     Past Medical History:  Diagnosis Date  . CHF (congestive heart failure) ([place])   . COPD (chronic obstructive pulmonary disease) ([place])   . Depression   . Emphysema lung ([place])   . Hypercholesterolemia   . Hypertension   . Myocardial infarction ([place])   . On home oxygen therapy    2 liters at night and as needed  . Pneumonia    in the past  . Presence of permanent cardiac pacemaker   . Shortness of breath dyspnea    with exertion     Past Surgical History:  Procedure Laterality Date  . ABDOMINAL AORTIC ANEURYSM REPAIR    . ANGIOPLASTY     left lower extremity  . BOWEL RESECTION N/A 08/07/2019   Procedure: SMALL BOWEL RESECTION;  Surgeon: Olean Ree, MD;  Location: [facility] ORS;  Service: General;  Laterality: N/A;  . CATARACT EXTRACTION W/PHACO Right 12/18/2015   Procedure: CATARACT EXTRACTION PHACO AND INTRAOCULAR LENS PLACEMENT ([place]);  Surgeon: Estill Cotta, MD;  Location: [facility] ORS;  Service: Ophthalmology;  Laterality: Right;  [place] 01:58 AP% 23.5 CDE 50.91 fluid pack lot # [id number]  . IMPLANTABLE CARDIOVERTER DEFIBRILLATOR (ICD) GENERATOR CHANGE N/A 05/12/2019   Procedure: ICD GENERATOR CHANGE;  Surgeon: Isaias Cowman, MD;  Location: [facility] ORS;  Service: Cardiovascular;  Laterality: N/A;  . INSERT / REPLACE / REMOVE PACEMAKER    . LAPAROTOMY N/A 08/07/2019   Procedure: EXPLORATORY LAPAROTOMY;  Surgeon: Olean Ree, MD;  Location: [facility] ORS;  Service: General;  Laterality: N/A;  . OSTOMY N/A 08/07/2019   Procedure: OSTOMY;  Surgeon: Olean Ree, MD;  Location: [facility] ORS;  Service: General;  Laterality: N/A;  . PACEMAKER PLACEMENT    . TONSILLECTOMY      Social History    Socioeconomic History  . Marital status: Widowed    Spouse name: Not on file  . Number of children: 3  . Years of education: Not on file  . Highest education level: Associate degree: occupational, Hotel manager, or vocational program  Occupational History  . Occupation: retired  Tobacco Use  . Smoking status: Former Smoker    Packs/day: 0.75    Years: 54.00    Pack years: 40.50    Types: Cigarettes    Quit date: 2014    Years since quitting: 7.1  . Smokeless tobacco: Never Used  . Tobacco comment: 2014?  Substance and Sexual Activity  . Alcohol use: No  . Drug use: No  . Sexual activity: Not Currently  Other Topics Concern  . Not on file  Social History Narrative  . Not on file   Social Determinants of Health   Financial Resource Strain:   . Difficulty of Paying Living Expenses: Not on file  Food Insecurity:   . Worried About Charity fundraiser in the Last Year: Not on file  . Ran Out of Food in the Last Year: Not on file  Transportation Needs:   . Lack of Transportation (Medical): Not on file  . Lack of Transportation (Non-Medical): Not on file  Physical Activity: Inactive  . Days of Exercise per Week: 0 days  . Minutes of Exercise per Session: 0 min  Stress:   . Feeling of Stress : Not on file  Social Connections: Unknown  . Frequency of Communication with Friends and Family:  Patient refused  . Frequency of Social Gatherings with Friends and Family: Patient refused  . Attends Religious Services: Patient refused  . Active Member of Clubs or Organizations: Patient refused  . Attends Archivist Meetings: Patient refused  . Marital Status: Patient refused  Intimate Partner Violence: Unknown  . Fear of Current or Ex-Partner: Patient refused  . Emotionally Abused: Patient refused  . Physically Abused: Patient refused  . Sexually Abused: Patient refused     Family History  Problem Relation Age of Onset  . Psoriasis Mother   . Heart attack Father       Current Facility-Administered Medications:  .  albuterol (PROVENTIL) (2.5 MG/3ML) 0.083% nebulizer solution 2.5 mg, 2.5 mg, Nebulization, Q6H PRN, Piscoya, Jose, MD .  amiodarone (PACERONE) tablet 200 mg, 200 mg, Oral, BID, Fritzi Mandes, MD, 200 mg at 08/10/19 1207 .  chlorhexidine gluconate (MEDLINE KIT) (PERIDEX) 0.12 % solution 15 mL, 15 mL, Mouth Rinse, BID, [name] D, NP, 15 mL at 08/10/19 0839 .  Chlorhexidine Gluconate Cloth 2 % PADS 6 each, 6 each, Topical, Daily, [name], NP, 6 each at 08/10/19 [phone] .  dexamethasone (DECADRON) injection 6 mg, 6 mg, Intravenous, Q24H, Oswald Hillock, RPH, 6 mg at 08/09/19 2012 .  dexmedetomidine (PRECEDEX) 400 MCG/100ML (4 mcg/mL) infusion, 0.4-1.2 mcg/kg/hr, Intravenous, Titrated, [name] D, NP, Last Rate: 7.94 mL/hr at 08/09/19 0400, 0.4 mcg/kg/hr at 08/09/19 0400 .  fentaNYL (SUBLIMAZE) bolus via infusion 25 mcg, 25 mcg, Intravenous, Q15 min PRN, Piscoya, Jose, MD .  fentaNYL (SUBLIMAZE) injection 25 mcg, 25 mcg, Intravenous, Once, Piscoya, Jose, MD .  fentaNYL 2581mg in NS 2571m(1090mml) infusion-PREMIX, 0-400 mcg/hr, Intravenous, Continuous, KeeDarel Hong NP, Stopped at 08/08/19 1511 .  heparin injection 5,000 Units, 5,000 Units, Subcutaneous, Q8H, Piscoya, Jose, MD, 5,000 Units at 08/10/19 0559 .  HYDROmorphone (DILAUDID) injection 0.5 mg, 0.5 mg, Intravenous, Q2H PRN, Piscoya, Jose, MD .  insulin aspart (novoLOG) injection 0-15 Units, 0-15 Units, Subcutaneous, Q4H, KeeDarel Hong NP, 2 Units at 08/10/19 0446 .  ketorolac (TORADOL) 15 MG/ML injection 15 mg, 15 mg, Intravenous, Q6H PRN, Piscoya, Jose, MD .  lactated ringers infusion, , Intravenous, Continuous, Aleskerov, Fuad, MD, Last Rate: 100 mL/hr at 08/10/19 0600, Rate Verify at 08/10/19 0600 .  midazolam (VERSED) injection 1 mg, 1 mg, Intravenous, Q15 min PRN, Piscoya, Jose, MD .  midazolam (VERSED) injection 1 mg, 1 mg, Intravenous, Q2H PRN, Piscoya,  Jose, MD .  ondansetron (ZOFRAN-ODT) disintegrating tablet 4 mg, 4 mg, Oral, Q6H PRN **OR** ondansetron (ZOFRAN) injection 4 mg, 4 mg, Intravenous, Q6H PRN, Piscoya, Jose, MD .  pantoprazole (PROTONIX) injection 40 mg, 40 mg, Intravenous, QHS, Piscoya, Jose, MD, 40 mg at 08/09/19 2128 .  piperacillin-tazobactam (ZOSYN) IVPB 3.375 g, 3.375 g, Intravenous, Q8H, Piscoya, Jose, MD, Last Rate: 12.5 mL/hr at 08/10/19 0600, Rate Verify at 08/10/19 0600 .  [COMPLETED] remdesivir 200 mg in sodium chloride 0.9% 250 mL IVPB, 200 mg, Intravenous, Once, Stopped at 08/08/19 1218 **FOLLOWED BY** remdesivir 100 mg in sodium chloride 0.9 % 100 mL IVPB, 100 mg, Intravenous, Daily, PatOswald HillockPH, Last Rate: 200 mL/hr at 08/10/19 1010, 100 mg at 08/10/19 1010   Physical exam:  Vitals:   08/10/19 0700 08/10/19 0730 08/10/19 1200 08/10/19 1300  BP: 138/68 (!) 153/111 (!) 144/62 (!) 157/72  Pulse: 75 (!) 107 87 89  Resp: 15 (!) _0 Temp:  98.8 F (37.1 C) 98.2 F (36.8  C)   TempSrc:  Oral Oral   SpO2: 98% 98% 96% 96%  Weight:      Height:       Physical Exam Constitutional:      Comments: Has O2 and NG tube in place  HENT:     Head: Normocephalic and atraumatic.  Eyes:     Pupils: Pupils are equal, round, and reactive to light.  Cardiovascular:     Rate and Rhythm: Normal rate and regular rhythm.     Heart sounds: Normal heart sounds.  Pulmonary:     Effort: Pulmonary effort is normal.     Breath sounds: Normal breath sounds.  Abdominal:     General: Bowel sounds are normal.     Palpations: Abdomen is soft.     Comments: Ileostomy in place containing stool and gas. B/l JP drains in place  Musculoskeletal:     Cervical back: Normal range of motion.  Skin:    General: Skin is warm and dry.  Neurological:     Mental Status: He is alert and oriented to person, place, and time.        CMP Latest Ref Rng & Units 08/10/2019  Glucose 70 - 99 mg/dL 134(H)  BUN 8 - 23 mg/dL 68(H)   Creatinine 0.61 - 1.24 mg/dL 2.17(H)  Sodium 135 - 145 mmol/L 146(H)  Potassium 3.5 - 5.1 mmol/L 3.9  Chloride 98 - 111 mmol/L 110  CO2 22 - 32 mmol/L 27  Calcium 8.9 - 10.3 mg/dL 8.3(L)  Total Protein 6.5 - 8.1 g/dL 4.8(L)  Total Bilirubin 0.3 - 1.2 mg/dL 0.8  Alkaline Phos 38 - 126 U/L 40  AST 15 - 41 U/L 76(H)  ALT 0 - 44 U/L 155(H)   CBC Latest Ref Rng & Units 08/10/2019  WBC 4.0 - 10.5 K/uL 15.6(H)  Hemoglobin 13.0 - 17.0 g/dL 11.0(L)  Hematocrit 39.0 - 52.0 % 33.8(L)  Platelets 150 - 400 K/uL 188    _0 @  CT ABDOMEN PELVIS WO CONTRAST  Result Date: 08/07/2019 CLINICAL DATA:  Abdominal pain, shortness of breath, constipation. Syncope in the bathroom today. Tachypneic. EXAM: CT CHEST, ABDOMEN AND PELVIS WITHOUT CONTRAST TECHNIQUE: Multidetector CT imaging of the chest, abdomen and pelvis was performed following the standard protocol without IV contrast. COMPARISON:  Multiple exams, including CT chest 05/08/2019 and CT abdomen 11/26/2011 FINDINGS: CT CHEST FINDINGS Cardiovascular: AICD noted. Coronary, aortic arch, and branch vessel atherosclerotic vascular disease. Mild cardiomegaly. Aortic valve calcification. Mediastinum/Nodes: Mildly distended esophagus. No adenopathy identified. Lungs/Pleura: Trace bilateral pleural effusions. Chronic right upper lobe scarring with a nodular portion measuring 1.2 by 0.9 cm on image 36/4, previously the same on 05/08/2019. Right Airway thickening is present, suggesting bronchitis or reactive airways disease. Mild reticulonodular opacities in the left upper lobe and left lower lobe favoring atypical infectious bronchiolitis, similar to the prior exam in general. Bronchiectatic airway plugging in the left lower lobe as on image 70/4. There is some increase in peripheral nodularity in the left lower lobe including a pleural-based 1.3 by 0.9 cm nodule on image 90/4, probably inflammatory given that this was not present 3 months ago, but surveillance is  probably warranted. No pneumothorax or pneumomediastinum. Musculoskeletal: No fracture or acute bony findings identified. CT ABDOMEN PELVIS FINDINGS Hepatobiliary: Unremarkable Pancreas: Hypodense pancreatic lesion measuring 2.7 by 1.9 cm on image 69/2, not well appreciated on prior CT the abdomen from 11/26/2011. This has fluid density internally and could be intraductal papillary mucinous neoplasm or a postinflammatory pancreatic  cystic lesion. Spleen: Unremarkable Adrenals/Urinary Tract: Adrenal glands normal. Severe atrophy of the right kidney. 1.5 cm in diameter exophytic fluid density lesion of the left kidney upper pole on image 50/5, likely a cyst. Similar fluid density 0.9 cm exophytic lesion from the left kidney lower pole, image 36/5. Vascular calcifications are present in the left renal hilum. Stomach/Bowel: Very large amount of free intraperitoneal gas, with additional scattered locules in the abdomen. No dilated small bowel loops. Pneumatosis and distension of the cecum, ascending colon, and transverse colon. Sigmoid colon diverticulosis. Vascular/Lymphatic: Upper abdominal aorta 5.0 cm transverse. Aneurysmal dilatation extends down into the infrarenal abdominal aorta, with a saccular aneurysmal component just below the right renal artery. Infrarenal abdominal aortic stent grafts extending into the common iliac arteries bilaterally. Extensive atherosclerosis. Reproductive: Moderate prostatomegaly. Other: Small amount of free pelvic fluid. Stranding and fluid along the right paracolic gutter. Musculoskeletal: Lumbar spondylosis and degenerative disc disease at L5-S1. IMPRESSION: 1. Very large amount of free intraperitoneal gas with additional scattered locules in the abdomen. Pneumatosis and distension of the cecum, ascending colon, and transverse colon. Appearance suspicious for colon perforation and possible ischemia. Emergent surgical consultation recommended. 2. Extensive atherosclerosis with known  abdominal aortic aneurysm. The infrarenal portion of the aneurysm has been treated with stent graft. 3. Trace bilateral pleural effusions. 4. Chronic reticulonodular opacities in the left upper lobe and left lower lobe favoring atypical infectious bronchiolitis. There are some new (from 3 months ago) nodules in the left lower lobe which are probably due to atypical infection, but which warrant surveillance imaging to exclude the unlikely possibility of new neoplastic nodules. 5. 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 6. Mild cardiomegaly. 7. Coronary, aortic arch, and branch vessel atherosclerotic vascular disease. Aortic valve calcification. 8. Hypodense pancreatic lesion measuring 2.7 by 1.9 cm, not well appreciated on prior CT abdomen from 11/26/2011. This is most likely to be an intraductal papillary mucinous neoplasm or a postinflammatory pancreatic cystic lesion, but enhancement characteristics are not assessed today. Although clearly the patient's current acute issues take precedence, follow up pancreatic protocol MRI with and without contrast would be recommended after complete resolution of the patient's acute situation in order to further characterize this lesion. 9. Severe atrophy of the right kidney. 10. Abdominal aortic aneurysm, with suprarenal portion measuring up to 5.0 cm transverse. Infrarenal abdominal aortic stent grafts extending into the common iliac arteries bilaterally. 11. Moderate prostatomegaly. 12. Lumbar spondylosis and degenerative disc disease. Critical Value/emergent results were called by telephone at the time of interpretation on 08/07/2019 at 2:28 pm to provider Dr. Marjean Donna , who verbally acknowledged these results. Electronically Signed   By: Van Clines M.D.   On: 08/07/2019 14:32   DG Abd 1 View  Result Date: 08/07/2019 CLINICAL DATA:  Pneumoperitoneum, postop EXAM: ABDOMEN - 1 VIEW COMPARISON:  08/07/2019 FINDINGS: Supine frontal view of the abdomen  was performed. The pelvis is excluded by collimation. Midline skin staples are seen from laparotomy. Surgical drains project over the right upper and left lower abdomen. Enteric catheter tip projects over gastric body. There is relative paucity of bowel gas. Residual pneumoperitoneum consistent with recent surgical intervention. Endoluminal stent graft within the aorta. IMPRESSION: 1. Postsurgical changes from midline laparotomy. 2. Support devices as above. Electronically Signed   By: Randa Ngo M.D.   On: 08/07/2019 22:51   CT Head Wo Contrast  Result Date: 08/07/2019 CLINICAL DATA:  Poly trauma, critical, head/cervical spine injury suspected. EXAM: CT HEAD WITHOUT CONTRAST  CT CERVICAL SPINE WITHOUT CONTRAST TECHNIQUE: Multidetector CT imaging of the head and cervical spine was performed following the standard protocol without intravenous contrast. Multiplanar CT image reconstructions of the cervical spine were also generated. COMPARISON:  None. FINDINGS: CT HEAD FINDINGS Brain: Mild generalized age related parenchymal volume loss with commensurate dilatation of the ventricles and sulci. No mass, hemorrhage, edema or other evidence of acute parenchymal abnormality. No extra-axial hemorrhage. Vascular: Chronic calcified atherosclerotic changes of the large vessels at the skull base. No unexpected hyperdense vessel. Skull: Normal. Negative for fracture or focal lesion. Sinuses/Orbits: No acute finding. Other: None. CT CERVICAL SPINE FINDINGS Alignment: Mild scoliosis. Slight reversal of the normal cervical spine lordosis. No evidence of acute vertebral body subluxation. Skull base and vertebrae: No fracture line or displaced fracture fragment seen. Facet joints are normally aligned. Soft tissues and spinal canal: No prevertebral fluid or swelling. No visible canal hematoma. Disc levels: Mild degenerative spondylosis at the C6-7 level. No significant central canal stenosis at any level. Upper chest: RIGHT  apical scarring, incompletely imaged, better demonstrated on earlier chest CT of 05/08/2019. No acute findings. Other: Bilateral carotid atherosclerosis. IMPRESSION: 1. No acute intracranial abnormality. No intracranial mass, hemorrhage or edema. No skull fracture. 2. No fracture or acute subluxation within the cervical spine. Mild scoliosis and slight reversal of the normal cervical spine lordosis is likely related to patient positioning or muscle spasm. 3. Carotid atherosclerosis. Electronically Signed   By: Franki Cabot M.D.   On: 08/07/2019 14:20   CT CHEST WO CONTRAST  Result Date: 08/07/2019 CLINICAL DATA:  Abdominal pain, shortness of breath, constipation. Syncope in the bathroom today. Tachypneic. EXAM: CT CHEST, ABDOMEN AND PELVIS WITHOUT CONTRAST TECHNIQUE: Multidetector CT imaging of the chest, abdomen and pelvis was performed following the standard protocol without IV contrast. COMPARISON:  Multiple exams, including CT chest 05/08/2019 and CT abdomen 11/26/2011 FINDINGS: CT CHEST FINDINGS Cardiovascular: AICD noted. Coronary, aortic arch, and branch vessel atherosclerotic vascular disease. Mild cardiomegaly. Aortic valve calcification. Mediastinum/Nodes: Mildly distended esophagus. No adenopathy identified. Lungs/Pleura: Trace bilateral pleural effusions. Chronic right upper lobe scarring with a nodular portion measuring 1.2 by 0.9 cm on image 36/4, previously the same on 05/08/2019. Right Airway thickening is present, suggesting bronchitis or reactive airways disease. Mild reticulonodular opacities in the left upper lobe and left lower lobe favoring atypical infectious bronchiolitis, similar to the prior exam in general. Bronchiectatic airway plugging in the left lower lobe as on image 70/4. There is some increase in peripheral nodularity in the left lower lobe including a pleural-based 1.3 by 0.9 cm nodule on image 90/4, probably inflammatory given that this was not present 3 months ago, but  surveillance is probably warranted. No pneumothorax or pneumomediastinum. Musculoskeletal: No fracture or acute bony findings identified. CT ABDOMEN PELVIS FINDINGS Hepatobiliary: Unremarkable Pancreas: Hypodense pancreatic lesion measuring 2.7 by 1.9 cm on image 69/2, not well appreciated on prior CT the abdomen from 11/26/2011. This has fluid density internally and could be intraductal papillary mucinous neoplasm or a postinflammatory pancreatic cystic lesion. Spleen: Unremarkable Adrenals/Urinary Tract: Adrenal glands normal. Severe atrophy of the right kidney. 1.5 cm in diameter exophytic fluid density lesion of the left kidney upper pole on image 50/5, likely a cyst. Similar fluid density 0.9 cm exophytic lesion from the left kidney lower pole, image 36/5. Vascular calcifications are present in the left renal hilum. Stomach/Bowel: Very large amount of free intraperitoneal gas, with additional scattered locules in the abdomen. No dilated small bowel loops. Pneumatosis and  distension of the cecum, ascending colon, and transverse colon. Sigmoid colon diverticulosis. Vascular/Lymphatic: Upper abdominal aorta 5.0 cm transverse. Aneurysmal dilatation extends down into the infrarenal abdominal aorta, with a saccular aneurysmal component just below the right renal artery. Infrarenal abdominal aortic stent grafts extending into the common iliac arteries bilaterally. Extensive atherosclerosis. Reproductive: Moderate prostatomegaly. Other: Small amount of free pelvic fluid. Stranding and fluid along the right paracolic gutter. Musculoskeletal: Lumbar spondylosis and degenerative disc disease at L5-S1. IMPRESSION: 1. Very large amount of free intraperitoneal gas with additional scattered locules in the abdomen. Pneumatosis and distension of the cecum, ascending colon, and transverse colon. Appearance suspicious for colon perforation and possible ischemia. Emergent surgical consultation recommended. 2. Extensive  atherosclerosis with known abdominal aortic aneurysm. The infrarenal portion of the aneurysm has been treated with stent graft. 3. Trace bilateral pleural effusions. 4. Chronic reticulonodular opacities in the left upper lobe and left lower lobe favoring atypical infectious bronchiolitis. There are some new (from 3 months ago) nodules in the left lower lobe which are probably due to atypical infection, but which warrant surveillance imaging to exclude the unlikely possibility of new neoplastic nodules. 5. 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 6. Mild cardiomegaly. 7. Coronary, aortic arch, and branch vessel atherosclerotic vascular disease. Aortic valve calcification. 8. Hypodense pancreatic lesion measuring 2.7 by 1.9 cm, not well appreciated on prior CT abdomen from 11/26/2011. This is most likely to be an intraductal papillary mucinous neoplasm or a postinflammatory pancreatic cystic lesion, but enhancement characteristics are not assessed today. Although clearly the patient's current acute issues take precedence, follow up pancreatic protocol MRI with and without contrast would be recommended after complete resolution of the patient's acute situation in order to further characterize this lesion. 9. Severe atrophy of the right kidney. 10. Abdominal aortic aneurysm, with suprarenal portion measuring up to 5.0 cm transverse. Infrarenal abdominal aortic stent grafts extending into the common iliac arteries bilaterally. 11. Moderate prostatomegaly. 12. Lumbar spondylosis and degenerative disc disease. Critical Value/emergent results were called by telephone at the time of interpretation on 08/07/2019 at 2:28 pm to provider Dr. Marjean Donna , who verbally acknowledged these results. Electronically Signed   By: Van Clines M.D.   On: 08/07/2019 14:32   CT Cervical Spine Wo Contrast  Result Date: 08/07/2019 CLINICAL DATA:  Poly trauma, critical, head/cervical spine injury suspected. EXAM: CT  HEAD WITHOUT CONTRAST CT CERVICAL SPINE WITHOUT CONTRAST TECHNIQUE: Multidetector CT imaging of the head and cervical spine was performed following the standard protocol without intravenous contrast. Multiplanar CT image reconstructions of the cervical spine were also generated. COMPARISON:  None. FINDINGS: CT HEAD FINDINGS Brain: Mild generalized age related parenchymal volume loss with commensurate dilatation of the ventricles and sulci. No mass, hemorrhage, edema or other evidence of acute parenchymal abnormality. No extra-axial hemorrhage. Vascular: Chronic calcified atherosclerotic changes of the large vessels at the skull base. No unexpected hyperdense vessel. Skull: Normal. Negative for fracture or focal lesion. Sinuses/Orbits: No acute finding. Other: None. CT CERVICAL SPINE FINDINGS Alignment: Mild scoliosis. Slight reversal of the normal cervical spine lordosis. No evidence of acute vertebral body subluxation. Skull base and vertebrae: No fracture line or displaced fracture fragment seen. Facet joints are normally aligned. Soft tissues and spinal canal: No prevertebral fluid or swelling. No visible canal hematoma. Disc levels: Mild degenerative spondylosis at the C6-7 level. No significant central canal stenosis at any level. Upper chest: RIGHT apical scarring, incompletely imaged, better demonstrated on earlier chest CT of 05/08/2019. No  acute findings. Other: Bilateral carotid atherosclerosis. IMPRESSION: 1. No acute intracranial abnormality. No intracranial mass, hemorrhage or edema. No skull fracture. 2. No fracture or acute subluxation within the cervical spine. Mild scoliosis and slight reversal of the normal cervical spine lordosis is likely related to patient positioning or muscle spasm. 3. Carotid atherosclerosis. Electronically Signed   By: Franki Cabot M.D.   On: 08/07/2019 14:20   DG Chest Port 1 View  Result Date: 08/08/2019 CLINICAL DATA:  Central line placement EXAM: PORTABLE CHEST 1  VIEW COMPARISON:  08/07/2019 FINDINGS: Endotracheal tube at the thoracic inlet, 10 cm above the carina. Right IJ venous catheter terminates in the proximal SVC. Enteric tube courses into the stomach. Lungs are essentially clear.  No pleural effusion or pneumothorax. The heart is normal in size. Left subclavian pacemaker. Thoracic aortic atherosclerosis. IMPRESSION: Endotracheal tube terminates 10 cm above the carina. Right IJ venous catheter terminates in the proximal SVC. Enteric tube courses into the stomach. Electronically Signed   By: Julian Hy M.D.   On: 08/08/2019 01:14   DG Chest Port 1 View  Result Date: 08/07/2019 CLINICAL DATA:  Intubated, syncope, pneumoperitoneum EXAM: PORTABLE CHEST 1 VIEW COMPARISON:  08/07/2019 FINDINGS: 2 frontal views endotracheal tube overlying of the chest demonstrate tracheal air column tip at level of thoracic inlet. Enteric catheter passes below diaphragm tip excluded by collimation. Dual lead pacer/AICD unchanged. Numerous cardiac leads overlie the chest. Cardiac silhouette is unremarkable. No airspace disease, effusion, or pneumothorax. No acute bony abnormalities. IMPRESSION: 1. Support devices as above. 2. No acute intrathoracic process. Electronically Signed   By: Randa Ngo M.D.   On: 08/07/2019 22:49    Assessment and plan- Patient is a 79 y.o. male admitted for bowel perforation secondary to malignant colon stricture s/p exploratory laparotomy complicated by Covid infection.  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  No perineural invasion or lymphovascular invasion was identified in 15 lymph nodes were negative for malignancy.  MSI testing is currently pending. Only risk factor was he presented with perforation.   Given his age, comorbidities and ongoing acute issues I would not  recommend adjuvant chemotherapy for stage II colon cancer and would recommend continued surveillance.  I will add a baseline CEA for his labs.  He did have reticulonodular opacities which was reported as atypical infection versus malignancy.  I will consider doing a repeat CT chest abdomen and pelvis in 3 months time  I will see him in my office after acute issues resolve   Thank you for this kind referral and the opportunity to participate in the care of this patient   Visit Diagnosis 1. Sepsis, due to unspecified organism, unspecified whether acute organ dysfunction present ([place])   2. Colon perforation ([place])   3. Syncope, unspecified syncope type   4. Encounter for intubation   5. Encounter for nasogastric (NG) tube placement   6. Encounter for central line placement     Dr. Randa Evens, MD, MPH [facility] [phone] 08/10/2019

## 2019-08-10 NOTE — Evaluation (Signed)
Physical Therapy Evaluation Patient Details Name: Rihaan Barrack. MRN: [id number] DOB: 1941-04-04 Today's Date: 08/10/2019   History of Present Illness  Pt is a [name] with PMH of COPD on 2L [place], HFrEF, CAD, and CKD III admitted 08/07/19 with severe pneumoperitoneum and perforated viscus where he was taken for emergency exploratory Laparotomy.  He required subtotal colectomy with end ileostomy.  Also found to be positive for COVID-19. PMH of CHF, abdominal aneurysm, COPD on 2L, depression, hypertension, CAD, ICD placement, and hyperlipidemia.    Clinical Impression  Patient alert, oriented to person, place, situation, re-oriented to time. Pt reported that he lives in his sons furnished basement, and that they can bring him meals if he doesn't want to navigate the stairs. No AD at baseline, I for dressing/bathing.   The patient was able to lift all extremities against gravity and performed several supine therapeutic exercises with verbal/tactile cues, no physical assist needed. Supine to sit log roll technique, verbal/tactile cues for sequencing, and minA for trunk elevation and LE management. The patient was able to sit EOB for several minutes, exhibited poor sitting balance. Pt needed feet supported and bilateral UE supported, as well as at least minA to remain seated, pt exhibited fatigue. Further mobility held due to poor sitting balance and need for second assist to manage lines/leads. Pt was able to reposition in bed with verbal cues.  Overall the patient demonstrated deficits (see "PT Problem List") that impede the patient's functional abilities, safety, and mobility and would benefit from skilled PT intervention. Recommendation is  SNF due to acute decline in functional status pending patient progress.     Follow Up Recommendations SNF    Equipment Recommendations  Rolling walker with 5" wheels    Recommendations for Other Services       Precautions / Restrictions  Precautions Precautions: Fall Precaution Comments: mutliple lines/leads (ng tube, two JP drains, ileostomy) Restrictions Weight Bearing Restrictions: No      Mobility  Bed Mobility Overal bed mobility: Needs Assistance Bed Mobility: Supine to Sit;Sit to Supine     Supine to sit: Min assist;HOB elevated Sit to supine: Min assist;HOB elevated   General bed mobility comments: minA for sequencing, LE management and trunk elevation  Transfers                 General transfer comment: deferred  Ambulation/Gait                Stairs            Wheelchair Mobility    Modified Rankin (Stroke Patients Only)       Balance Overall balance assessment: Needs assistance Sitting-balance support: Feet supported;Bilateral upper extremity supported;Feet unsupported Sitting balance-Leahy Scale: Poor Sitting balance - Comments: minA to maintain balance in sitting                                     Pertinent Vitals/Pain Pain Assessment: No/denies pain    Home Living Family/patient expects to be discharged to:: Private residence Living Arrangements: Children Available Help at Discharge: Family Type of Home: House Home Access: Level entry     Home Layout: Laundry or work area in basement;Other (Comment)(son's finished basement) Home Equipment: None      Prior Function Level of Independence: Independent               Hand Dominance  Extremity/Trunk Assessment   Upper Extremity Assessment Upper Extremity Assessment: Generalized weakness    Lower Extremity Assessment Lower Extremity Assessment: RLE deficits/detail;LLE deficits/detail RLE Deficits / Details: Pt able to lift LE against gravity without physical assist LLE Deficits / Details: Pt able to lift LE against gravity without physical assist    Cervical / Trunk Assessment Cervical / Trunk Assessment: (ileostomy, two JP drains)  Communication   Communication: No  difficulties;HOH  Cognition Arousal/Alertness: Awake/alert Behavior During Therapy: WFL for tasks assessed/performed Overall Cognitive Status: Within Functional Limits for tasks assessed                                 General Comments: oriented to self, place, situation, reoriented to time      General Comments      Exercises General Exercises - Lower Extremity Ankle Circles/Pumps: AROM;Both;10 reps Quad Sets: AROM;Strengthening;Both;10 reps Gluteal Sets: AROM;Strengthening;Both;10 reps Heel Slides: AROM;Strengthening;Both;10 reps Hip ABduction/ADduction: AROM;Strengthening;Both;10 reps Straight Leg Raises: AROM;Strengthening;Both;10 reps   Assessment/Plan    PT Assessment Patient needs continued PT services  PT Problem List Decreased strength;Decreased mobility;Decreased range of motion;Decreased activity tolerance;Decreased balance;Decreased knowledge of use of DME;Pain;Decreased knowledge of precautions;Decreased safety awareness       PT Treatment Interventions DME instruction;Therapeutic exercise;Gait training;Balance training;Stair training;Neuromuscular re-education;Functional mobility training;Therapeutic activities;Patient/family education    PT Goals (Current goals can be found in the Care Plan section)  Acute Rehab PT Goals Patient Stated Goal: to get better PT Goal Formulation: With patient Time For Goal Achievement: 08/24/19 Potential to Achieve Goals: Good    Frequency Min 2X/week   Barriers to discharge        Co-evaluation               AM-PAC PT "6 Clicks" Mobility  Outcome Measure Help needed turning from your back to your side while in a flat bed without using bedrails?: A Lot Help needed moving from lying on your back to sitting on the side of a flat bed without using bedrails?: A Lot Help needed moving to and from a bed to a chair (including a wheelchair)?: A Lot Help needed standing up from a chair using your arms (e.g.,  wheelchair or bedside chair)?: A Lot Help needed to walk in hospital room?: A Lot Help needed climbing 3-5 steps with a railing? : Total 6 Click Score: 11    End of Session Equipment Utilized During Treatment: Oxygen Activity Tolerance: Patient limited by fatigue Patient left: in bed;with call bell/phone within reach;with bed alarm set;with SCD's reapplied Nurse Communication: Mobility status PT Visit Diagnosis: Other abnormalities of gait and mobility (R26.89);Muscle weakness (generalized) (M62.81);Difficulty in walking, not elsewhere classified (R26.2)    Time: 1320-1404 PT Time Calculation (min) (ACUTE ONLY): 44 min   Charges:   PT Evaluation $PT Eval Moderate Complexity: 1 Mod PT Treatments $Therapeutic Exercise: 23-37 mins       Lieutenant Diego PT, DPT 2:36 PM,08/10/19

## 2019-08-11 ENCOUNTER — Encounter: Payer: Self-pay | Admitting: Surgery

## 2019-08-11 DIAGNOSIS — Z7189 Other specified counseling: Secondary | ICD-10-CM

## 2019-08-11 DIAGNOSIS — N179 Acute kidney failure, unspecified: Secondary | ICD-10-CM

## 2019-08-11 DIAGNOSIS — N1831 Chronic kidney disease, stage 3a: Secondary | ICD-10-CM

## 2019-08-11 DIAGNOSIS — Z515 Encounter for palliative care: Secondary | ICD-10-CM

## 2019-08-11 DIAGNOSIS — I4821 Permanent atrial fibrillation: Secondary | ICD-10-CM

## 2019-08-11 DIAGNOSIS — R6520 Severe sepsis without septic shock: Secondary | ICD-10-CM

## 2019-08-11 LAB — CBC
HCT: 36 % — ABNORMAL LOW (ref 39.0–52.0)
Hemoglobin: 11.5 g/dL — ABNORMAL LOW (ref 13.0–17.0)
MCH: 28.4 pg (ref 26.0–34.0)
MCHC: 31.9 g/dL (ref 30.0–36.0)
MCV: 88.9 fL (ref 80.0–100.0)
Platelets: 201 10*3/uL (ref 150–400)
RBC: 4.05 MIL/uL — ABNORMAL LOW (ref 4.22–5.81)
RDW: 15.8 % — ABNORMAL HIGH (ref 11.5–15.5)
WBC: 10.7 10*3/uL — ABNORMAL HIGH (ref 4.0–10.5)
nRBC: 0 % (ref 0.0–0.2)

## 2019-08-11 LAB — COMPREHENSIVE METABOLIC PANEL
ALT: 111 U/L — ABNORMAL HIGH (ref 0–44)
AST: 35 U/L (ref 15–41)
Albumin: 2.3 g/dL — ABNORMAL LOW (ref 3.5–5.0)
Alkaline Phosphatase: 38 U/L (ref 38–126)
Anion gap: 10 (ref 5–15)
BUN: 62 mg/dL — ABNORMAL HIGH (ref 8–23)
CO2: 24 mmol/L (ref 22–32)
Calcium: 8.3 mg/dL — ABNORMAL LOW (ref 8.9–10.3)
Chloride: 111 mmol/L (ref 98–111)
Creatinine, Ser: 1.78 mg/dL — ABNORMAL HIGH (ref 0.61–1.24)
GFR calc Af Amer: 42 mL/min — ABNORMAL LOW (ref >60)
GFR calc non Af Amer: 36 mL/min — ABNORMAL LOW (ref >60)
Glucose, Bld: 135 mg/dL — ABNORMAL HIGH (ref 70–99)
Potassium: 4 mmol/L (ref 3.5–5.1)
Sodium: 145 mmol/L (ref 135–145)
Total Bilirubin: 0.7 mg/dL (ref 0.3–1.2)
Total Protein: 5.1 g/dL — ABNORMAL LOW (ref 6.5–8.1)

## 2019-08-11 LAB — GLUCOSE, CAPILLARY
Glucose-Capillary: 114 mg/dL — ABNORMAL HIGH (ref 70–99)
Glucose-Capillary: 130 mg/dL — ABNORMAL HIGH (ref 70–99)
Glucose-Capillary: 183 mg/dL — ABNORMAL HIGH (ref 70–99)
Glucose-Capillary: 103 mg/dL — ABNORMAL HIGH (ref 70–99)
Glucose-Capillary: 113 mg/dL — ABNORMAL HIGH (ref 70–99)

## 2019-08-11 LAB — FIBRIN DERIVATIVES D-DIMER (ARMC ONLY): Fibrin derivatives D-dimer (ARMC): 3576.39 ng/mL (FEU) — ABNORMAL HIGH (ref 0.00–499.00)

## 2019-08-11 LAB — C-REACTIVE PROTEIN: CRP: 15.2 mg/dL — ABNORMAL HIGH (ref <1.0)

## 2019-08-11 LAB — FERRITIN: Ferritin: 113 ng/mL (ref 24–336)

## 2019-08-11 MED ORDER — TRAZODONE HCL 50 MG PO TABS
50.0000 mg | ORAL_TABLET | Freq: Every evening | ORAL | Status: DC | PRN
  Administered 2019-08-11 – 2019-08-13 (×2): 50 mg via ORAL
  Filled 2019-08-11 (×3): qty 1

## 2019-08-11 MED ORDER — SODIUM BICARBONATE 8.4 % IV SOLN
INTRAVENOUS | Status: AC
  Filled 2019-08-11: qty 50

## 2019-08-11 NOTE — Progress Notes (Signed)
Pharmacy Antibiotic Note  Phillip Allison. is a 79 y.o. male admitted on 08/07/2019 with intraabdominal infection. Patient admited through the emergency department with distended abdomen and 2 day history of abdominal pain. Patient Positive for Coronavirus on 2/13; per H&P patient is asymptomatic. Pharmacy has been consulted for Zosyn dosing. Patient received cefepime, metronidazole, and vancomycin in ED on 2/13.   Plan: Continue Zosyn EI  3.375g IV Q8hr.   Will obtain serum creatinine with am labs and adjust as necessary.   Height: 5\' 7"  (170.2 cm) Weight: 188 lb 4.4 oz (85.4 kg) IBW/kg (Calculated) : 66.1  Temp (24hrs), Avg:98.1 F (36.7 C), Min:96.7 F (35.9 C), Max:98.6 F (37 C)  Recent Labs  Lab 08/07/19 1220 08/07/19 1220 08/07/19 2201 08/08/19 0119 08/08/19 0228 08/09/19 0440 08/10/19 0420 08/11/19 0439  WBC 30.5*   < > 18.3*  --  26.3* 17.7* 15.6* 10.7*  CREATININE 2.49*   < > 2.30* 2.35*  --  2.27* 2.17* 1.78*  LATICACIDVEN 6.4*  --  1.8  --   --   --   --   --    < > = values in this interval not displayed.    Estimated Creatinine Clearance: 36.3 mL/min (A) (by C-G formula based on SCr of 1.78 mg/dL (H)).    No Known Allergies  Antimicrobials this admission: Vancomycin 2/13 x 1 Cefepime 2/13 x 1 Metronidazole 2/13 x 1 Zosyn 2/13 >>   Dose adjustments this admission: N/A  Microbiology results: 2/14 respiratory Cx: WBC present (PMN, mononuclear), rare Candida Albicans 2/13 BCx: pending  2/13 SARS Coronavirus 2: positive  2/13 Influenza A/B: negative   Thank you for allowing pharmacy to be a part of this patient's care.  Faylene Million,  Pharmacy Student 08/11/2019 11:14 AM

## 2019-08-11 NOTE — Consult Note (Signed)
                                                                                 Consultation Note Date: 08/11/2019   Patient Name: Phillip E Shimel Jr.  DOB: 02/08/1941  MRN: [id number]  Age / Sex: 78 y.o., male  PCP: Gilbert, Richard L Jr., MD Referring Physician: Piscoya, Jose, MD  Reason for Consultation: Establishing goals of care and Psychosocial/spiritual support  HPI/Patient Profile: 78 y.o. male  with past medical history of  + COVID-19, asymptomatic. COPD -  2L baseline, CHF, HTN/CAD, pacemaker, EVAR for AAA, Echo 05/11/19 -  EF of 30-35%. CKD 3 - atrophic R kidney admitted on 08/07/2019 with acute hypoxic/hypercarbic respiratory failure from sepsis with perforated viscus and COVID-19 infection, large bowel obstruction related to new cancer diagnosis.   Clinical Assessment and Goals of Care: Phillip Allison is resting quietly in bed on the Covid unit.  I view him through the glass door, but he does not wake when I knock on the door.  He appears quite ill and frail.  Call to son/Default healthcare power of attorney, Phillip Allison.  We talk about disposition.  Phillip shares that he feels his father would not do well at rehab and feels confident that he can take him home.  He shares that they have used advanced home care in the past.  We talked about equipment, and Phillip seems relieved that the hospital staff will set up equipment delivery.  We talk in detail about oncology consult, following up with oncology outpatient. We talked about outpatient palliative services.  Phillip states that he is familiar with outpatient palliative and hospice services.  Phillip request outpatient palliative with Phillip Allison to follow Phillip Allison at home.  Conference with attending and case management related to patient condition, needs, disposition.   HCPOA    NEXT OF KIN -son, Phillip Allison    SUMMARY OF RECOMMENDATIONS   Treat the treatable but no CPR, no  intubation Outpatient palliative, Phillip care, to follow.   Code Status/Advance Care Planning:  DNR  Symptom Management:   Per hospitalist, no additional needs at this time.  Palliative Prophylaxis:   Aspiration and Frequent Pain Assessment  Additional Recommendations (Limitations, Scope, Preferences):  Treat the treatable but no CPR, no intubation.  Psycho-social/Spiritual:   Desire for further Chaplaincy support:no  Additional Recommendations: Caregiving  Support/Resources and Education on Hospice  Prognosis:   Unable to determine, based on outcomes.  6 months or less would not be surprising based on frailty, declining functional status, new cancer diagnosis.  Discharge Planning: Patient and family are requesting home with home health although he has been qualified for rehab      Primary Diagnoses: Present on Admission: . Pneumoperitoneum   I have reviewed the medical record, interviewed the patient and family, and examined the patient. The following aspects are pertinent.  Past Medical History:  Diagnosis Date  . CHF (congestive heart failure) (HCC)   . COPD (chronic obstructive pulmonary disease) (HCC)   . Depression   . Emphysema lung (HCC)   . Hypercholesterolemia   . Hypertension   . Myocardial infarction (HCC)   . On home   oxygen therapy    2 liters at night and as needed  . Pneumonia    in the past  . Presence of permanent cardiac pacemaker   . Shortness of breath dyspnea    with exertion   Social History   Socioeconomic History  . Marital status: Widowed    Spouse name: Not on file  . Number of children: 3  . Years of education: Not on file  . Highest education level: Associate degree: occupational, technical, or vocational program  Occupational History  . Occupation: retired  Tobacco Use  . Smoking status: Former Smoker    Packs/day: 0.75    Years: 54.00    Pack years: 40.50    Types: Cigarettes    Quit date: 2014    Years since  quitting: 7.1  . Smokeless tobacco: Never Used  . Tobacco comment: 2014?  Substance and Sexual Activity  . Alcohol use: No  . Drug use: No  . Sexual activity: Not Currently  Other Topics Concern  . Not on file  Social History Narrative  . Not on file   Social Determinants of Health   Financial Resource Strain:   . Difficulty of Paying Living Expenses: Not on file  Food Insecurity:   . Worried About Running Out of Food in the Last Year: Not on file  . Ran Out of Food in the Last Year: Not on file  Transportation Needs:   . Lack of Transportation (Medical): Not on file  . Lack of Transportation (Non-Medical): Not on file  Physical Activity: Inactive  . Days of Exercise per Week: 0 days  . Minutes of Exercise per Session: 0 min  Stress:   . Feeling of Stress : Not on file  Social Connections: Unknown  . Frequency of Communication with Friends and Family: Patient refused  . Frequency of Social Gatherings with Friends and Family: Patient refused  . Attends Religious Services: Patient refused  . Active Member of Clubs or Organizations: Patient refused  . Attends Club or Organization Meetings: Patient refused  . Marital Status: Patient refused   Family History  Problem Relation Age of Onset  . Psoriasis Mother   . Heart attack Father    Scheduled Meds: . amiodarone  200 mg Oral BID  . carvedilol  6.25 mg Oral BID WC  . chlorhexidine gluconate (MEDLINE KIT)  15 mL Mouth Rinse BID  . Chlorhexidine Gluconate Cloth  6 each Topical Daily  . dexamethasone (DECADRON) injection  6 mg Intravenous Q24H  . fentaNYL (SUBLIMAZE) injection  25 mcg Intravenous Once  . heparin  5,000 Units Subcutaneous Q8H  . insulin aspart  0-15 Units Subcutaneous Q4H  . pantoprazole (PROTONIX) IV  40 mg Intravenous QHS  . pravastatin  40 mg Oral QHS  . sertraline  50 mg Oral Daily   Continuous Infusions: . dexmedetomidine (PRECEDEX) IV infusion 0.4 mcg/kg/hr (08/09/19 0400)  . lactated ringers 100  mL/hr at 08/11/19 0500  . piperacillin-tazobactam (ZOSYN)  IV 3.375 g (08/11/19 1541)  . remdesivir 100 mg in NS 100 mL 100 mg (08/11/19 0945)   PRN Meds:.albuterol, HYDROmorphone (DILAUDID) injection, ketorolac, ondansetron **OR** ondansetron (ZOFRAN) IV, traZODone Medications Prior to Admission:  Prior to Admission medications   Medication Sig Start Date End Date Taking? Authorizing Provider  albuterol (PROVENTIL) (2.5 MG/3ML) 0.083% nebulizer solution Take 3 mLs (2.5 mg total) by nebulization every 6 (six) hours as needed for wheezing or shortness of breath. 07/05/19  Yes Gilbert, Richard L Jr., MD    amLODipine (NORVASC) 5 MG tablet Take 1 tablet (5 mg total) by mouth daily. 10/10/17  Yes Patel, Sona, MD  aspirin 81 MG tablet Take 81 mg by mouth at bedtime.  08/22/11  Yes [provider]  carvedilol (COREG) 6.25 MG tablet Take 1 tablet (6.25 mg total) by mouth 2 (two) times daily with a meal. 05/12/19  Yes Niu, Xilin, MD  ENTRESTO 24-26 MG Take 1 tablet by mouth 2 (two) times daily. 05/05/19  Yes [provider]  furosemide (LASIX) 20 MG tablet Take 20 mg by mouth 2 (two) times daily. 05/25/19  Yes [provider]  guaiFENesin (MUCINEX) 600 MG 12 hr tablet Take 1 tablet (600 mg total) by mouth 2 (two) times daily. 05/12/19  Yes Niu, Xilin, MD  pravastatin (PRAVACHOL) 40 MG tablet Take 1 tablet (40 mg total) by mouth at bedtime. 10/12/18  Yes Gilbert, Richard L Jr., MD  sertraline (ZOLOFT) 50 MG tablet TAKE 1 TABLET(50 MG) BY MOUTH DAILY Patient taking differently: Take 50 mg by mouth daily.  08/03/19  Yes Gilbert, Richard L Jr., MD   No Known Allergies Review of Systems  Unable to perform ROS: Other    Physical Exam Vitals and nursing note reviewed.     Vital Signs: BP (!) 115/55   Pulse 60   Temp (!) 96.7 F (35.9 C) (Axillary)   Resp (!) 9   Ht 5' 7" (1.702 m)   Wt 85.4 kg   SpO2 100%   BMI 29.49 kg/m  Pain Scale: 0-10   Pain Score: 0-No  pain   SpO2: SpO2: 100 % O2 Device:SpO2: 100 % O2 Flow Rate: .O2 Flow Rate (L/min): 2 L/min  IO: Intake/output summary:   Intake/Output Summary (Last 24 hours) at 08/11/2019 1645 Last data filed at 08/11/2019 1546 Gross per 24 hour  Intake 2484.03 ml  Output 1145 ml  Net 1339.03 ml    LBM: Last BM Date: 08/11/19 Baseline Weight: Weight: 79.4 kg Most recent weight: Weight: 85.4 kg     Palliative Assessment/Data:   Flowsheet Rows     Most Recent Value  Intake Tab  Referral Department  Hospitalist  Unit at Time of Referral  Med/Surg Unit  Palliative Care Primary Diagnosis  Cancer  Date Notified  08/11/19  Palliative Care Type  New Palliative care  Reason for referral  Clarify Goals of Care  Date of Admission  08/07/19  Date first seen by Palliative Care  08/11/19  # of days Palliative referral response time  0 Day(s)  # of days IP prior to Palliative referral  4  Clinical Assessment  Palliative Performance Scale Score  40%  Pain Max last 24 hours  Not able to report  Pain Min Last 24 hours  Not able to report  Dyspnea Max Last 24 Hours  Not able to report  Dyspnea Min Last 24 hours  Not able to report  Psychosocial & Spiritual Assessment  Palliative Care Outcomes      Time In: 1510 Time Out: 1600 Time Total: 50 minutes  Greater than 50%  of this time was spent counseling and coordinating care related to the above assessment and plan.  Signed by: Tasha A Dove, NP   Please contact Palliative Medicine Team phone at [phone] for questions and concerns.  For individual provider: See Amion 

## 2019-08-11 NOTE — TOC Initial Note (Signed)
Transition of Care (TOC) - Initial/Assessment Note    Patient Details  Name: Phillip Allison. MRN: [id number] Date of Birth: October 06, 1940  Transition of Care [facility]) CM/SW Contact:    Shelbie Hutching, RN Phone Number: 08/11/2019, 1:57 PM  Clinical Narrative:                 Patient admitted to the hospital for sepsis after colon perforation, found to have colon cancer.  Patient is status post ex lap with subtotal colectomy with end ileostomy.  Patient is from home where he lives with his son, Phillip Allison.  Patient lives in the basement and his son provides his meals and transportation.  Patient is on chronic O2 at 2L from [employer].  Patient does not have any DME at home, he has never had the need for any equipment.  Patient prefers to go home at discharge but PT has recommended skilled nursing rehab.  Patient agrees to go to rehab if it will help him get home.  Patients son Phillip Allison reports that he thinks the patient would be okay to come home if we get home health services and equipment ordered.  Son would like a hospital bed and walker for the patient.  [employer] will be able to provide equipment and patient and son would like [facility] to provide home health services.   Patient's son Phillip Allison is retired and is able to stay with the patient 24/7.   Phillip Allison is going to speak with the patient about the discharge plan to make sure he is in agreement.   Expected Discharge Plan: [place] Barriers to Discharge: Continued Medical Work up   Patient Goals and CMS Choice Patient states their goals for this hospitalization and ongoing recovery are:: Patient would like to go home and patient's son would prefer for him to go home also CMS Medicare.gov Compare Post Acute Care list provided to:: Patient Choice offered to / list presented to : Patient  Expected Discharge Plan and Services Expected Discharge Plan: [place]   Discharge Planning Services: CM Consult Post  Acute Care Choice: [place] arrangements for the past 2 months: [place]: [place] ([place]) Date [place]: 08/11/19 Time [place]: [id number] Representative spoke with at [place]: Floydene Flock  Prior Living Arrangements/Services Living arrangements for the past 2 months: [place] with:: Adult Children(Son Phillip Allison) Patient language and need for interpreter reviewed:: Yes Do you feel safe going back to the place where you live?: Yes      Need for Family Participation in Patient Care: Yes (Comment)(COVID- colan cancer) Care giver support system in place?: Yes (comment)(son and daughter in law)   Criminal Activity/Legal Involvement Pertinent to Current Situation/Hospitalization: No - Comment as needed  Activities of Daily Living Home Assistive Devices/Equipment: None ADL Screening (condition at time of admission) Patient's cognitive ability adequate to safely complete daily activities?: Yes Is the patient deaf or have difficulty hearing?: No Does the patient have difficulty seeing, even when wearing glasses/contacts?: No Does the patient have difficulty concentrating, remembering, or making decisions?: No Patient able to express need for assistance with ADLs?: Yes Does the patient have difficulty dressing or bathing?: No Independently performs ADLs?: Yes (appropriate for developmental age) Does the patient have  difficulty walking or climbing stairs?: Yes Weakness of Legs: Both Weakness of Arms/Hands: None  Permission Sought/Granted Permission sought to share information with : Case Manager, Family Supports, Other (comment) Permission granted to share information with : Yes, Verbal Permission Granted     Permission granted to share info w AGENCY: [place] granted to share info w Relationship: Son - Phillip Allison     Emotional Assessment    Attitude/Demeanor/Rapport: Engaged Affect (typically observed): Accepting Orientation: : Oriented to Self, Oriented to Place, Oriented to  Time, Oriented to Situation Alcohol / Substance Use: Not Applicable Psych Involvement: No (comment)  Admission diagnosis:  Pneumoperitoneum [K66.8] Colon perforation (HCC) [K63.1] Syncope, unspecified syncope type [R55] Sepsis, due to unspecified organism, unspecified whether acute organ dysfunction present [facility]) [A41.9] Patient Active Problem List   Diagnosis Date Noted  . Acute renal failure with acute tubular necrosis superimposed on stage 3 chronic kidney disease ([place])   . Atrial fibrillation ([place])   . Pneumoperitoneum 08/07/2019  . Colon perforation ([place])   . Hyperkalemia 05/13/2019  . Hyperphosphatemia 05/12/2019  . Hypermagnesemia 05/12/2019  . Hemoptysis 05/08/2019  . HTN (hypertension) 05/08/2019  . Depression 05/08/2019  . Sepsis ([place]) 12/15/2017  . Acute on chronic respiratory failure with hypoxia and hypercapnia ([place]) 10/08/2017  . COPD (chronic obstructive pulmonary disease) ([place]) 03/11/2016  . History of tobacco use 03/13/2015  . History of atrial fibrillation 03/13/2015  . Chronic systolic heart failure ([place]) 03/13/2015  . Intraventricular block 03/13/2015  . 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[place]) 03/10/2015  . Cardiovascular disease 03/10/2015  . Arteriosclerosis of coronary artery 03/10/2015  . CAFL (chronic airflow limitation) ([place]) 03/10/2015  . Chronic kidney disease (CKD), stage III (moderate) 03/10/2015  . Depression, major, single episode, mild ([place]) 03/10/2015  . 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 Acid reflux 03/10/2015  . Heart & renal disease, hypertensive, with heart failure ([place]) 03/10/2015  . Cardiomyopathy, ischemic 03/10/2015  . 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[place]) 03/10/2015  . Hypercholesterolemia without hypertriglyceridemia 03/10/2015  . Cardiac defibrillator in place 01/25/2015  . MI (mitral  incompetence) 11/01/2014  . Benign essential HTN 10/17/2014   PCP:  Jerrol Banana., MD Pharmacy:   [facility], [place] [address] Phone: [phone] Fax: [place], [place] [facility] [address] [place] [place] [facility] [place] [address] Phone: [phone] Fax: [phone]     Social Determinants of Health (SDOH) Interventions    Readmission Risk Interventions No flowsheet data found.

## 2019-08-11 NOTE — Progress Notes (Signed)
Physical Therapy Treatment Patient Details Name: Phillip Allison. MRN: [id number] DOB: Oct 19, 1940 Today's Date: 08/11/2019    History of Present Illness Pt is a [name] with PMH of COPD on 2L [place], HFrEF, CAD, and CKD III admitted 08/07/19 with severe pneumoperitoneum and perforated viscus where he was taken for emergency exploratory Laparotomy.  He required subtotal colectomy with end ileostomy.  Also found to be positive for COVID-19. PMH of CHF, abdominal aneurysm, COPD on 2L, depression, hypertension, CAD, ICD placement, and hyperlipidemia.    PT Comments    Patient easily woken at start of session, oriented to self, place, situation. Session demonstrated good progression towards goals. Pt on .5L of O2 via [place] throughout, spO2 >90% (unclear reading noted during mobility). The patient demonstrated log rolling technique with verbal cues and CGA. Fair balance in sitting, feet supported and at least unilateral UE support (though preferred bilateral UE support). Sit <> stand attempted multiple times, pt needed elevated bed surface and pulled heavily on RW to achieve standing position. Static standing with CGA, fair balance, and pt took a few steps to recliner in room with CGA, cues for technique for RW management. Pt fatigued in chair, RN entered room, further mobility deferred. The patient would benefit from further skilled PT intervention to continue to progress towards goals to maximize mobility, safety, and independence. Recommendation remains appropriate.      Follow Up Recommendations  SNF     Equipment Recommendations  Rolling walker with 5" wheels    Recommendations for Other Services       Precautions / Restrictions Precautions Precautions: Fall Precaution Comments: two JP drains, ileostomy Restrictions Weight Bearing Restrictions: No    Mobility  Bed Mobility Overal bed mobility: Needs Assistance Bed Mobility: Rolling;Sidelying to Sit Rolling: Min  guard Sidelying to sit: Min guard;HOB elevated       General bed mobility comments: CGA for safety, cues for sequencing movements  Transfers Overall transfer level: Needs assistance Equipment used: Rolling walker (2 wheeled) Transfers: Sit to/from Stand Sit to Stand: Min assist;From elevated surface         General transfer comment: Pt needed several attempts to complete, elevated bed surface, pulled heavily on RW  Ambulation/Gait Ambulation/Gait assistance: Min guard Gait Distance (Feet): 2 Feet Assistive device: Rolling walker (2 wheeled)       General Gait Details: CGA throughout to take a few steps to recliner in room. Fatigued sitting in chair, further mobility held Investment banker, corporate also entered room)   Marine scientist [place] (Stroke Patients Only)       Balance Overall balance assessment: Needs assistance Sitting-balance support: Feet supported;Bilateral upper extremity supported Sitting balance-Leahy Scale: Fair Sitting balance - Comments: feet supported, bilateral UE supported     Standing balance-Leahy Scale: Fair                              Cognition Arousal/Alertness: Awake/alert Behavior During Therapy: WFL for tasks assessed/performed Overall Cognitive Status: Within Functional Limits for tasks assessed                                        Exercises      General Comments General comments (skin integrity, edema, etc.): .5L throughout session, spO2>90% (unclear reading  while mobilizing to chair due to poor pleth)      Pertinent Vitals/Pain Pain Assessment: No/denies pain    Home Living                      Prior Function            PT Goals (current goals can now be found in the care plan section) Progress towards PT goals: Progressing toward goals    Frequency    Min 2X/week      PT Plan Current plan remains appropriate    Co-evaluation               AM-PAC PT "6 Clicks" Mobility   Outcome Measure  Help needed turning from your back to your side while in a flat bed without using bedrails?: A Little Help needed moving from lying on your back to sitting on the side of a flat bed without using bedrails?: A Lot Help needed moving to and from a bed to a chair (including a wheelchair)?: A Little Help needed standing up from a chair using your arms (e.g., wheelchair or bedside chair)?: A Little Help needed to walk in hospital room?: A Lot Help needed climbing 3-5 steps with a railing? : Total 6 Click Score: 14    End of Session Equipment Utilized During Treatment: Oxygen Activity Tolerance: Patient limited by fatigue;Patient tolerated treatment well Patient left: with call bell/phone within reach;in chair;with chair alarm set;with nursing/sitter in room Nurse Communication: Mobility status PT Visit Diagnosis: Other abnormalities of gait and mobility (R26.89);Muscle weakness (generalized) (M62.81);Difficulty in walking, not elsewhere classified (R26.2)     Time: 1500-1540 PT Time Calculation (min) (ACUTE ONLY): 40 min  Charges:  $Therapeutic Exercise: 38-52 mins                    Lieutenant Diego PT, DPT 3:57 PM,08/11/19

## 2019-08-11 NOTE — Progress Notes (Signed)
Patient ID: Burns Timson., male   DOB: April 22, 1941, 79 y.o.   MRN: [id number] [facility] NAME: Phillip Allison    MR#:  [id number]  DATE OF BIRTH:  1941-04-30  SUBJECTIVE:  Overall doing well.  No new issues.  Minimal abdominal pain, afebrile.  Heart rate controlled on oral amiodarone REVIEW OF SYSTEMS:   Review of Systems  Constitutional: Positive for malaise/fatigue. Negative for chills, fever and weight loss.  HENT: Negative for ear discharge, ear pain and nosebleeds.   Eyes: Negative for blurred vision, pain and discharge.  Respiratory: Negative for sputum production, shortness of breath, wheezing and stridor.   Cardiovascular: Negative for chest pain, palpitations, orthopnea and PND.  Gastrointestinal: Positive for abdominal pain. Negative for diarrhea, nausea and vomiting.  Genitourinary: Negative for frequency and urgency.  Musculoskeletal: Negative for back pain and joint pain.  Neurological: Positive for weakness. Negative for sensory change, speech change and focal weakness.  Psychiatric/Behavioral: Negative for depression and hallucinations. The patient is not nervous/anxious.    Tolerating Diet: Ice chips Tolerating PT: Recommended SNF  DRUG ALLERGIES:  No Known Allergies  VITALS:  Blood pressure (!) 115/55, pulse 60, temperature (!) 96.7 F (35.9 C), temperature source Axillary, resp. rate (!) 9, height 5\' 7"  (1.702 m), weight 85.4 kg, SpO2 100 %.  PHYSICAL EXAMINATION:   Physical Exam  GENERAL:  79 y.o.-year-old patient lying in the bed with no acute distress. critically ill EYES: Pupils equal, round, reactive to light and accommodation. No scleral icterus.   HEENT: Head atraumatic, normocephalic. Oropharynx and nasopharynx clear.  NECK:  Supple, no jugular venous distention. No thyroid enlargement, no tenderness.  LUNGS: decreased breath sounds bilaterally, no wheezing, rales, rhonchi. No use  of accessory muscles of respiration.  CARDIOVASCULAR: S1, S2 normal. No murmurs, rubs, or gallops. tachycardia ABDOMEN: Soft, nontender, nondistended. Bowel sounds present. No organomegaly or mass. Surgical dressing+ EXTREMITIES: No cyanosis, clubbing or edema b/l.    NEUROLOGIC: Cranial nerves II through XII are intact. No focal Motor or sensory deficits b/l.  weak PSYCHIATRIC:  patient is alert and oriented x 3.  SKIN: No obvious rash, lesion, or ulcer.  LABORATORY PANEL:  CBC Recent Labs  Lab 08/11/19 0439  WBC 10.7*  HGB 11.5*  HCT 36.0*  PLT 201    Chemistries  Recent Labs  Lab 08/10/19 0420 08/10/19 0420 08/11/19 0439  NA 146*   < > 145  K 3.9   < > 4.0  CL 110   < > 111  CO2 27   < > 24  GLUCOSE 134*   < > 135*  BUN 68*   < > 62*  CREATININE 2.17*   < > 1.78*  CALCIUM 8.3*   < > 8.3*  MG 2.1  --   --   AST 76*   < > 35  ALT 155*   < > 111*  ALKPHOS 40   < > 38  BILITOT 0.8   < > 0.7   < > = values in this interval not displayed.   Cardiac Enzymes No results for input(s): TROPONINI in the last 168 hours. RADIOLOGY:  No results found. ASSESSMENT AND PLAN:  [name] with PMH of COPD on 2L [place], HFrEF, CAD,andCKD III admitted 2/13/21with severepneumoperitoneumand perforated viscus where he was taken foremergency exploratory Laparotomy.He required subtotal colectomy with end ileostomy  Acute hypoxic/hypercarbic respiratory failure from sepsis with perforated viscous and COVID-19 infection -patient  presented February 13 with severe  pneumoperitoneum underwent emergent exploratory laparotomy requiring subtotal colectomy with and ileostomy by Dr. Hampton Abbot -Tolerated ice chips -PRN pain meds by surgery  Pneumoperitoneum due to Large bowel obstruction -found to have severe stricture of sigmoid colon and prelim path results showed Invasive adenocarcinoma --Oncology consulted  Severe sepsis secondary to perforated viscous present on admission with acute renal  failure/ATN with h/o CKD III Baseline creat 1.7 -creat 2.30--2.17->1.7 -good urine output -Continue IV fluids -IV Zosyn -blood culture remains negative  Pneumonia secondary to covid-19 infection -on IV Remdesivir (day 3/5) and decadron -continue oxygen and wean as tolerated -as needed bronchodilator  A fib with RVR, acute -cardiology consultation with Dr. Nehemiah Massed -On oral amiodarone 200 BID and Coreg 6.25 mg p.o. twice daily  Congestive heart failure chronic systolic and CAD -EF of 53% -Lasix held due to elevated creatinine -coreg, pravastatin  -Enteresto on hold (high creat).  Resume at discharge  History of COPD on chronic home oxygen 2 L -bronchodilators as needed -currently on steroids  Depression Cont zoloft  CODE STATUS: DNR DVT Prophylaxis :heparin   Discharge planning per primary team.  Will need 2 more days of remdesivir possibly Friday if skilled nursing facility bed available  TOTAL TIME TAKING CARE OF THIS PATIENT: *35* minutes.  >50% time spent on counselling and coordination of care  Note: This dictation was prepared with Dragon dictation along with smaller phrase technology. Any transcriptional errors that result from this process are unintentional.  [name] M.D    [facility]   CC: Primary care physician; Jerrol Banana., MD

## 2019-08-11 NOTE — Progress Notes (Signed)
[facility] Hospital Day(s): 4.   Post op day(s): 4 Days Post-Op.   Interval History:  Patient seen and examined no acute events or new complaints overnight.  Patient reports he is feeling better; expected abdominal soreness No fever, chills, nausea, or emesis Leukocytosis further improved to 10.7K this morning; afebrile Renal function improving; sCr 1.78; U/O - 350 recorded on chart NGT removed yesterday; continued ileostomy function Heart rate improved; now on PO amiodarone Surgical pathology final; invasive adenocarcinoma; 0/15 lymph nodes; oncology following and recommending surveillance   Vital signs in last 24 hours: [min-max] current  Temp:  [98.2 F (36.8 C)-98.6 F (37 C)] 98.6 F (37 C) (02/17 0400) Pulse Rate:  [62-106] 69 (02/17 0600) Resp:  [14-24] 20 (02/17 0600) BP: (125-173)/(56-158) 152/64 (02/17 0600) SpO2:  [91 %-100 %] 100 % (02/17 0600)     Height: 5\' 7"  (170.2 cm) Weight: 85.4 kg BMI (Calculated): 29.48   Intake/Output last 2 shifts:  02/16 0701 - 02/17 0700 In: 3672.6 [I.V.:1852.3; IV Piggyback:280.3] Out: 665 [Urine:350; Stool:315]   Physical Exam:  Constitutional: alert, cooperative and no distress HEENT: NGT in place Respiratory: breathing non-labored at rest  Cardiovascular: regular rate and sinus rhythm  Gastrointestinal: soft,incisional soreness, and non-distended, no rebound/guarding. 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 JP drains in both LLQ and RLQ with serosanguinous output Genitourinary: Foley present, making clear urine Integumentary:Laparotomy incision is CDI with staples and honeycomb, no appreciable erythema  Labs:  CBC Latest Ref Rng & Units 08/11/2019 08/10/2019 08/09/2019  WBC 4.0 - 10.5 K/uL 10.7(H) 15.6(H) 17.7(H)  Hemoglobin 13.0 - 17.0 g/dL 11.5(L) 11.0(L) 10.9(L)  Hematocrit 39.0 - 52.0 % 36.0(L) 33.8(L) 33.5(L)   Platelets 150 - 400 K/uL 201 188 185   CMP Latest Ref Rng & Units 08/11/2019 08/10/2019 08/09/2019  Glucose 70 - 99 mg/dL 135(H) 134(H) 123(H)  BUN 8 - 23 mg/dL 62(H) 68(H) 71(H)  Creatinine 0.61 - 1.24 mg/dL 1.78(H) 2.17(H) 2.27(H)  Sodium 135 - 145 mmol/L 145 146(H) 141  Potassium 3.5 - 5.1 mmol/L 4.0 3.9 3.8  Chloride 98 - 111 mmol/L 111 110 110  CO2 22 - 32 mmol/L 24 27 21(L)  Calcium 8.9 - 10.3 mg/dL 8.3(L) 8.3(L) 8.2(L)  Total Protein 6.5 - 8.1 g/dL 5.1(L) 4.8(L) 5.3(L)  Total Bilirubin 0.3 - 1.2 mg/dL 0.7 0.8 0.7  Alkaline Phos 38 - 126 U/L 38 40 47  AST 15 - 41 U/L 35 76(H) 153(H)  ALT 0 - 44 U/L 111(H) 155(H) 183(H)     Imaging studies: No new pertinent imaging studies   Assessment/Plan:  79 y.o. male overall doing well with slow improvement 4 Days Post-Op s/p exploratory laparotomy, subtotal colostomy, and creation of end ileostomyfor pneumoperitoneum and large bowel obstruction most likely secondary to invasive adenocarcinoma on pathology, complicated by pertinent comorbidities includingCOVID +.   - Continue CLD for today             - Continue foley catheter today; monitor U/O             - IVF resuscitation - Pain control prn; antiemetics prn - Monitor abdominal examination; ileostomy function - Appreciate WOC RN assistance with ileostomy care - Mobilize as tolerates; PT following; current recommendations are SNF             - Oncology following; recommending surveillance in 3 months - Appreciate PCCM assistance; transfer to floor once  medically cleared  All of the above findings and recommendations were discussed with the patient, and the medical team, and all of patient's questions were answered to his expressed satisfaction.  -- Edison Simon, PA-C [facility] 08/11/2019, 7:46 AM [phone] M-F: 7am - 4pm

## 2019-08-12 DIAGNOSIS — Z515 Encounter for palliative care: Secondary | ICD-10-CM

## 2019-08-12 LAB — CULTURE, BLOOD (ROUTINE X 2)
Culture: NO GROWTH
Culture: NO GROWTH
Special Requests: ADEQUATE
Special Requests: ADEQUATE

## 2019-08-12 LAB — BASIC METABOLIC PANEL WITH GFR
Anion gap: 5 (ref 5–15)
BUN: 55 mg/dL — ABNORMAL HIGH (ref 8–23)
CO2: 26 mmol/L (ref 22–32)
Calcium: 8.1 mg/dL — ABNORMAL LOW (ref 8.9–10.3)
Chloride: 112 mmol/L — ABNORMAL HIGH (ref 98–111)
Creatinine, Ser: 1.72 mg/dL — ABNORMAL HIGH (ref 0.61–1.24)
GFR calc Af Amer: 43 mL/min — ABNORMAL LOW
GFR calc non Af Amer: 38 mL/min — ABNORMAL LOW
Glucose, Bld: 127 mg/dL — ABNORMAL HIGH (ref 70–99)
Potassium: 4.1 mmol/L (ref 3.5–5.1)
Sodium: 143 mmol/L (ref 135–145)

## 2019-08-12 LAB — CBC
HCT: 36.6 % — ABNORMAL LOW (ref 39.0–52.0)
Hemoglobin: 11.7 g/dL — ABNORMAL LOW (ref 13.0–17.0)
MCH: 28.4 pg (ref 26.0–34.0)
MCHC: 32 g/dL (ref 30.0–36.0)
MCV: 88.8 fL (ref 80.0–100.0)
Platelets: 189 10*3/uL (ref 150–400)
RBC: 4.12 MIL/uL — ABNORMAL LOW (ref 4.22–5.81)
RDW: 16 % — ABNORMAL HIGH (ref 11.5–15.5)
WBC: 9.1 10*3/uL (ref 4.0–10.5)
nRBC: 0 % (ref 0.0–0.2)

## 2019-08-12 LAB — GLUCOSE, CAPILLARY
Glucose-Capillary: 101 mg/dL — ABNORMAL HIGH (ref 70–99)
Glucose-Capillary: 114 mg/dL — ABNORMAL HIGH (ref 70–99)
Glucose-Capillary: 116 mg/dL — ABNORMAL HIGH (ref 70–99)
Glucose-Capillary: 122 mg/dL — ABNORMAL HIGH (ref 70–99)
Glucose-Capillary: 147 mg/dL — ABNORMAL HIGH (ref 70–99)
Glucose-Capillary: 173 mg/dL — ABNORMAL HIGH (ref 70–99)
Glucose-Capillary: 82 mg/dL (ref 70–99)

## 2019-08-12 MED ORDER — FUROSEMIDE 20 MG PO TABS
20.0000 mg | ORAL_TABLET | Freq: Every day | ORAL | Status: DC
  Administered 2019-08-12 – 2019-08-14 (×3): 20 mg via ORAL
  Filled 2019-08-12 (×3): qty 1

## 2019-08-12 MED ORDER — AMIODARONE HCL 200 MG PO TABS
400.0000 mg | ORAL_TABLET | Freq: Two times a day (BID) | ORAL | Status: DC
  Administered 2019-08-12 – 2019-08-14 (×4): 400 mg via ORAL
  Filled 2019-08-12 (×4): qty 2

## 2019-08-12 MED ORDER — SODIUM CHLORIDE 0.9 % IV SOLN: INTRAVENOUS | Status: DC | PRN

## 2019-08-12 NOTE — Progress Notes (Signed)
Patient suffers from [place] 19 and general deconditioning which impairs their ability to perform daily activities like walking, bathing, and toileting in the home.  A walker will not resolve issue with performing activities of daily living. A wheelchair will allow patient to safely perform daily activities. Patient is not able to propel themselves in the home using a standard weight wheelchair due to weakness. Patient can self propel in the lightweight wheelchair. Length of need lifetime. Accessories: elevating leg rests (ELRs), wheel locks, extensions and anti-tippers, seat cushion and back cushion.

## 2019-08-12 NOTE — Progress Notes (Addendum)
Call to evaluate patient with reported episode of paroxysmal atrial fibrillation with RVR tachycardia Patient had been seen earlier by Dr. Nehemiah Massed with arrhythmias including atrial fibrillation tachycardia Patient has history of Covid and is recovering had been treated with remdesivir Appears to be recovering well Patient has been on amiodarone 200 mg twice a day for antiarrhythmic therapy Our goal is to increase amiodarone to 400 twice a day for short period of time release during hospitalization Then reduce the dose after the patient is discharged and seen by cardiology as an outpatient Case discussed with hospitalist Patient was up in her room talking on the phone appeared to be in no apparent distress

## 2019-08-12 NOTE — TOC Progression Note (Signed)
Transition of Care (TOC) - Progression Note    Patient Details  Name: Phillip Allison. MRN: [id number] Date of Birth: 26-Aug-1940  Transition of Care [facility]) CM/SW Contact  Shelbie Hutching, RN Phone Number: 08/12/2019, 9:46 AM  Clinical Narrative:    Outpatient palliative referral given to Flo Shanks with [facility].  Plan for discharge home with [place] and equipment from [employer].    Expected Discharge Plan: [place] Barriers to Discharge: Continued Medical Work up  Expected Discharge Plan and Services Expected Discharge Plan: [place]   Discharge Planning Services: CM Consult Post Acute Care Choice: [place] arrangements for the past 2 months: [place]: [place] ([place]) Date [place]: 08/11/19 Time [place]: 1355 Representative spoke with at [place]: [place] Determinants of Health ([place]) Interventions    Readmission Risk Interventions No flowsheet data found.

## 2019-08-12 NOTE — Plan of Care (Signed)
  Problem: Skin Integrity: Goal: Risk for impaired skin integrity will decrease 08/12/2019 1109 by Lanier Ensign, RN Outcome: Progressing 08/12/2019 1108 by Lanier Ensign, RN Outcome: Progressing

## 2019-08-12 NOTE — Progress Notes (Signed)
New referral for [facility] community Palliative to follow at home received from [facility]. Patient will also be followed by [facility] post discharge. Patient information given to referral. Flo Shanks BSN, RN, [place] [phone]

## 2019-08-12 NOTE — TOC Progression Note (Signed)
Transition of Care (TOC) - Progression Note    Patient Details  Name: Phillip Allison. MRN: [id number] Date of Birth: April 16, 1941  Transition of Care [facility]) CM/SW Contact  Shelbie Hutching, RN Phone Number: 08/12/2019, 4:52 PM  Clinical Narrative:    Possible discharge for this weekend pending patient progress.  Needed DME ordered and Brad with [employer] notified of equipment needs.  Hospital bed, wheelchair, and 3 in 1.  Patient will also need a walker but insurance will only pay for either a walker or wheelchair not both.  Son can have the option to purchase walker separately from [employer] or from another store like pharmacy or [place].     Expected Discharge Plan: [place] Barriers to Discharge: Continued Medical Work up  Expected Discharge Plan and Services Expected Discharge Plan: [place]   Discharge Planning Services: CM Consult Post Acute Care Choice: [place] arrangements for the past 2 months: Single Family Home                 DME Arranged: 3-N-1, Youth worker wheelchair with seat cushion, Hospital bed DME Agency: [employer] Date DME Agency Contacted: 08/12/19 Time DME Agency Contacted: [id number] Representative spoke with at DME Agency: Emailed [place] Mom HH Arranged: RN, PT, OT, Nurse's Aide [place] Agency: [place] ([place]) Date [place]: 08/11/19 Time [place]: 1355 Representative spoke with at [place]: [place] (SDOH) Interventions    Readmission Risk Interventions No flowsheet data found.

## 2019-08-12 NOTE — Progress Notes (Signed)
Patient ID: Phillip Allison., male   DOB: [birth year], 79 y.o.   MRN: [id number] [place] [facility] [place] NAME: Phillip Allison    MR#:  [id number]  DATE OF BIRTH:  08/19/1940  SUBJECTIVE:  Had asymptomatic 10 beats of nonsustained V. tach earlier today.  Patient tolerating diet.  Denies any new complaints REVIEW OF SYSTEMS:   Review of Systems  Constitutional: Positive for malaise/fatigue. Negative for chills, fever and weight loss.  HENT: Negative for ear discharge, ear pain and nosebleeds.   Eyes: Negative for blurred vision, pain and discharge.  Respiratory: Negative for sputum production, shortness of breath, wheezing and stridor.   Cardiovascular: Negative for chest pain, palpitations, orthopnea and PND.  Gastrointestinal: Negative for diarrhea, nausea and vomiting.  Genitourinary: Negative for frequency and urgency.  Musculoskeletal: Negative for back pain and joint pain.  Neurological: Positive for weakness. Negative for sensory change, speech change and focal weakness.  Psychiatric/Behavioral: Negative for depression and hallucinations. The patient is not nervous/anxious.    Tolerating Diet: Ice chips Tolerating PT: Recommends SNF  DRUG ALLERGIES:  No Known Allergies  VITALS:  Blood pressure (!) 168/73, pulse 71, temperature (!) 97.5 F (36.4 C), temperature source Axillary, resp. rate 18, height 5\' 7"  (1.702 m), weight 85.4 kg, SpO2 97 %.  PHYSICAL EXAMINATION:   Physical Exam  GENERAL:  79 y.o.-year-old patient lying in the bed with no acute distress. critically ill EYES: Pupils equal, round, reactive to light and accommodation. No scleral icterus.   HEENT: Head atraumatic, normocephalic. Oropharynx and nasopharynx clear.  NECK:  Supple, no jugular venous distention. No thyroid enlargement, no tenderness.  LUNGS: decreased breath sounds bilaterally, no wheezing, rales, rhonchi. No use of accessory muscles of  respiration.  CARDIOVASCULAR: S1, S2 normal. No murmurs, rubs, or gallops. tachycardia ABDOMEN: Soft, nontender, nondistended. Bowel sounds present. No organomegaly or mass. Surgical dressing+ EXTREMITIES: No cyanosis, clubbing or edema b/l.    NEUROLOGIC: Cranial nerves II through XII are intact. No focal Motor or sensory deficits b/l.  weak PSYCHIATRIC:  patient is alert and oriented x 3.  SKIN: No obvious rash, lesion, or ulcer.  LABORATORY PANEL:  CBC Recent Labs  Lab 08/12/19 0456  WBC 9.1  HGB 11.7*  HCT 36.6*  PLT 189    Chemistries  Recent Labs  Lab 08/10/19 0420 08/10/19 0420 08/11/19 0439 08/11/19 0439 08/12/19 0456  NA 146*   < > 145   < > 143  K 3.9   < > 4.0   < > 4.1  CL 110   < > 111   < > 112*  CO2 27   < > 24   < > 26  GLUCOSE 134*   < > 135*   < > 127*  BUN 68*   < > 62*   < > 55*  CREATININE 2.17*   < > 1.78*   < > 1.72*  CALCIUM 8.3*   < > 8.3*   < > 8.1*  MG 2.1  --   --   --   --   AST 76*   < > 35  --   --   ALT 155*   < > 111*  --   --   ALKPHOS 40   < > 38  --   --   BILITOT 0.8   < > 0.7  --   --    < > = values in this interval not  displayed.   Cardiac Enzymes No results for input(s): TROPONINI in the last 168 hours. RADIOLOGY:  No results found. ASSESSMENT AND PLAN:  [name] with PMH of COPD on 2L [place], HFrEF, CAD,andCKD III admitted 2/13/21with severepneumoperitoneumand perforated viscus where he was taken foremergency exploratory Laparotomy.He required subtotal colectomy with end ileostomy  Acute hypoxic/hypercarbic respiratory failure from sepsis with perforated viscous and COVID-19 infection -patient presented February 13 with severe  pneumoperitoneum underwent emergent exploratory laparotomy requiring subtotal colectomy with and ileostomy by Dr. Hampton Abbot -Tolerated ice chips -PRN pain meds by surgery  Pneumoperitoneum due to Large bowel obstruction -found to have severe stricture of sigmoid colon and prelim path results  showed Invasive adenocarcinoma --Oncology consulted  Severe sepsis secondary to perforated viscous present on admission with acute renal failure/ATN with h/o CKD III Baseline creat 1.7 -creat 2.30-->1.7 -good urine output -Resume Lasix -IV Zosyn -blood culture remains negative  Pneumonia secondary to covid-19 infection -Completed remdesivir (day 5/5) and continue decadron -continue oxygen and wean as tolerated -as needed bronchodilator  A fib with RVR, acute -cardiology consultation with Dr. Nehemiah Massed -On Coreg 6.25 mg p.o. twice daily -Considering nonsustained V. tach earlier today cardiology has increased the dose of amiodarone to 400 mg oral twice a day for now can be tapered to 200 mg p.o. twice a day at discharge  Congestive heart failure chronic systolic and CAD -EF of 82% -Restart small dose of Lasix as he is +8 L fluid balance -coreg, pravastatin  -Enteresto on hold (high creat).  Resume at discharge  History of COPD on chronic home oxygen 2 L -bronchodilators as needed -currently on steroids  Depression Cont zoloft  CODE STATUS: DNR DVT Prophylaxis :heparin   Discharge planning per primary team.  He has completed remdesivir course  TOTAL TIME TAKING CARE OF THIS PATIENT: *35* minutes.  >50% time spent on counselling and coordination of care  Note: This dictation was prepared with Dragon dictation along with smaller phrase technology. Any transcriptional errors that result from this process are unintentional.  [name] M.D    [facility]   CC: Primary care physician; Phillip Allison., MD

## 2019-08-12 NOTE — Progress Notes (Signed)
Patient has COVID 19 and colon cancer, status post colectomy which requires patient's head and upper torso to be positioned in ways not feasible with a normal bed. Head must be elevated at least 30 degrees or he experiences shortness of breath and severe pain.  COVID 19 and colon cancer  requires frequent and immediate  changes in body position which cannot be achieved with a normal bed.

## 2019-08-12 NOTE — Progress Notes (Signed)
[place] Hospital Day(s): 5.   Post op day(s): 5 Days Post-Op.   Interval History:  Patient seen and examined no acute events or new complaints overnight. Out of ICU Patient reports he is doing well, some abdominal soreness No nausea or emesis Leukocytosis resolved; now 9.1K; afebrile Renal function stable improved; sCr 1.72; U/O - 1L in last 24 hours Tolerated CLD; continued ileostomy function Surgical drains with 225 ccs (right) and 145 ccs (Left) in the last 24 hours Heart rate remains controlled on PO amiodarone No new complaints.    Vital signs in last 24 hours: [min-max] current  Temp:  [96.7 F (35.9 C)-97.5 F (36.4 C)] 97.5 F (36.4 C) (02/18 0341) Pulse Rate:  [60-100] 68 (02/18 0341) Resp:  [9-20] 14 (02/18 0341) BP: (115-160)/(55-64) 155/57 (02/18 0341) SpO2:  [94 %-100 %] 97 % (02/18 0341)     Height: 5\' 7"  (170.2 cm) Weight: 85.4 kg BMI (Calculated): 29.48   Intake/Output last 2 shifts:  02/17 0701 - 02/18 0700 In: 423.8 [I.V.:300; IV Piggyback:123.8] Out: 4627 [Urine:1025; Drains:370; Stool:200]   Physical Exam:  Constitutional: alert, cooperative and no distress Respiratory: breathing non-labored at rest  Cardiovascular: regular rate and sinus rhythm  Gastrointestinal: soft,incisional soreness, and non-distended, no rebound/guarding. Ileostomy in the left abdomen, ostomy is pink and patent,there is gas and stool present in the bag. JP drains in both LLQ and RLQ with serosanguinous output Genitourinary:Foley present, making clear urine Integumentary:Laparotomy incision is CDI with staples and honeycomb, no appreciable erythema   Labs:  CBC Latest Ref Rng & Units 08/12/2019 08/11/2019 08/10/2019  WBC 4.0 - 10.5 K/uL 9.1 10.7(H) 15.6(H)  Hemoglobin 13.0 - 17.0 g/dL 11.7(L) 11.5(L) 11.0(L)  Hematocrit 39.0 - 52.0 % 36.6(L) 36.0(L) 33.8(L)  Platelets 150 - 400 K/uL 189 201 188   CMP Latest Ref Rng & Units  08/12/2019 08/11/2019 08/10/2019  Glucose 70 - 99 mg/dL 127(H) 135(H) 134(H)  BUN 8 - 23 mg/dL 55(H) 62(H) 68(H)  Creatinine 0.61 - 1.24 mg/dL 1.72(H) 1.78(H) 2.17(H)  Sodium 135 - 145 mmol/L 143 145 146(H)  Potassium 3.5 - 5.1 mmol/L 4.1 4.0 3.9  Chloride 98 - 111 mmol/L 112(H) 111 110  CO2 22 - 32 mmol/L 26 24 27   Calcium 8.9 - 10.3 mg/dL 8.1(L) 8.3(L) 8.3(L)  Total Protein 6.5 - 8.1 g/dL - 5.1(L) 4.8(L)  Total Bilirubin 0.3 - 1.2 mg/dL - 0.7 0.8  Alkaline Phos 38 - 126 U/L - 38 40  AST 15 - 41 U/L - 35 76(H)  ALT 0 - 44 U/L - 111(H) 155(H)    Imaging studies: No new pertinent imaging studies   Assessment/Plan:  79 y.o. male overall doing well 5 Days Post-Op s/p exploratory laparotomy, subtotal colostomy, and creation of end ileostomyfor pneumoperitoneum and large bowel obstructionmost likely secondary to invasive adenocarcinoma on pathology, complicated by pertinent comorbidities includingCOVID +.   - Advance to full liquid diet this morning  - Discontinue Foley   - Pain control prn; antiemetics prn - Monitor abdominal examination; ileostomy function - Appreciate WOC RN assistance with ileostomy care - Mobilize as tolerates; PT following; current recommendations are SNF --> however son plans to take him home with home health - Oncology following; recommending surveillance in 3 months - Appreciate medicine assistance  All of the above findings and recommendations were discussed with the patient, and the medical team, and all of patient's questions were answered to his expressed satisfaction.  -- Edison Simon, PA-C [facility] 08/12/2019, 7:33 AM [phone] M-F: 7am - 4pm

## 2019-08-12 NOTE — Progress Notes (Addendum)
Physical Therapy Treatment Patient Details Name: Phillip Allison. MRN: [id number] DOB: [birth year] Today's Date: 08/12/2019    History of Present Illness Pt is a [name] with PMH of COPD on 2L [place], HFrEF, CAD, and CKD III admitted 08/07/19 with severe pneumoperitoneum and perforated viscus where he was taken for emergency exploratory Laparotomy.  He required subtotal colectomy with end ileostomy.  Also found to be positive for COVID-19. PMH of CHF, abdominal aneurysm, COPD on 2L, depression, hypertension, CAD, ICD placement, and hyperlipidemia.    PT Comments    Pt easily woken, did display and complain of more fatigue than previous sessions, pt politely, but firmly declined OOB mobility today. Session focused on LE strengthening, pt able to complete with tactile cues bilaterally. PT and pt also discussed concerns over safe discharge home, PT still recommending SNF at this time. The patient would benefit from further skilled PT intervention to continue to maximize mobility, safety, and independence.     Patient suffers from COPD, CAD, CKDIII, CHF which impairs his/her ability to perform daily activities like toileting, feeding, dressing, grooming, bathing in the home. A cane, walker, crutch will not resolve the patient's issue with performing activities of daily living. A lightweight wheelchair is required/recommended and will allow patient to safely perform daily activities.   Patient can safely propel the wheelchair in the home or has a caregiver who can provide assistance.    Follow Up Recommendations  SNF     Equipment Recommendations  Rolling walker with 5" wheels, wheelchair    Recommendations for Other Services       Precautions / Restrictions Precautions Precautions: Fall Precaution Comments: two JP drains, ileostomy Restrictions Weight Bearing Restrictions: No    Mobility  Bed Mobility               General bed mobility comments: deferred due to pt  fatigue  Transfers                    Ambulation/Gait                 Stairs             Wheelchair Mobility    Modified Rankin (Stroke Patients Only)       Balance                                            Cognition Arousal/Alertness: Lethargic Behavior During Therapy: WFL for tasks assessed/performed Overall Cognitive Status: Within Functional Limits for tasks assessed                                        Exercises General Exercises - Lower Extremity Ankle Circles/Pumps: AROM;Both;10 reps Short Arc Quad: AROM;Strengthening;Both;10 reps Heel Slides: AROM;Strengthening;Both;10 reps Hip ABduction/ADduction: AROM;Strengthening;Both;10 reps    General Comments        Pertinent Vitals/Pain Pain Assessment: No/denies pain    Home Living                      Prior Function            PT Goals (current goals can now be found in the care plan section) Progress towards PT goals: Progressing toward goals(slowly)    Frequency  Min 2X/week      PT Plan Current plan remains appropriate    Co-evaluation              AM-PAC PT "6 Clicks" Mobility   Outcome Measure  Help needed turning from your back to your side while in a flat bed without using bedrails?: A Little Help needed moving from lying on your back to sitting on the side of a flat bed without using bedrails?: A Lot Help needed moving to and from a bed to a chair (including a wheelchair)?: A Little Help needed standing up from a chair using your arms (e.g., wheelchair or bedside chair)?: A Little Help needed to walk in hospital room?: A Lot Help needed climbing 3-5 steps with a railing? : Total 6 Click Score: 14    End of Session Equipment Utilized During Treatment: Oxygen Activity Tolerance: Patient limited by fatigue;Patient tolerated treatment well Patient left: in bed;with bed alarm set;with SCD's reapplied;with call  bell/phone within reach Nurse Communication: Mobility status PT Visit Diagnosis: Other abnormalities of gait and mobility (R26.89);Muscle weakness (generalized) (M62.81);Difficulty in walking, not elsewhere classified (R26.2)     Time: 9233-0076 PT Time Calculation (min) (ACUTE ONLY): 18 min  Charges:  $Therapeutic Exercise: 8-22 mins                     Lieutenant Diego PT, DPT 2:58 PM,08/12/19

## 2019-08-13 LAB — BASIC METABOLIC PANEL
Anion gap: 6 (ref 5–15)
BUN: 53 mg/dL — ABNORMAL HIGH (ref 8–23)
CO2: 27 mmol/L (ref 22–32)
Calcium: 8 mg/dL — ABNORMAL LOW (ref 8.9–10.3)
Chloride: 111 mmol/L (ref 98–111)
Creatinine, Ser: 1.72 mg/dL — ABNORMAL HIGH (ref 0.61–1.24)
GFR calc Af Amer: 43 mL/min — ABNORMAL LOW (ref >60)
GFR calc non Af Amer: 38 mL/min — ABNORMAL LOW (ref >60)
Glucose, Bld: 143 mg/dL — ABNORMAL HIGH (ref 70–99)
Potassium: 4.3 mmol/L (ref 3.5–5.1)
Sodium: 144 mmol/L (ref 135–145)

## 2019-08-13 LAB — CBC
HCT: 37.5 % — ABNORMAL LOW (ref 39.0–52.0)
Hemoglobin: 11.7 g/dL — ABNORMAL LOW (ref 13.0–17.0)
MCH: 27.9 pg (ref 26.0–34.0)
MCHC: 31.2 g/dL (ref 30.0–36.0)
MCV: 89.5 fL (ref 80.0–100.0)
Platelets: 198 10*3/uL (ref 150–400)
RBC: 4.19 MIL/uL — ABNORMAL LOW (ref 4.22–5.81)
RDW: 16 % — ABNORMAL HIGH (ref 11.5–15.5)
WBC: 8.9 10*3/uL (ref 4.0–10.5)
nRBC: 0 % (ref 0.0–0.2)

## 2019-08-13 LAB — GLUCOSE, CAPILLARY
Glucose-Capillary: 126 mg/dL — ABNORMAL HIGH (ref 70–99)
Glucose-Capillary: 121 mg/dL — ABNORMAL HIGH (ref 70–99)
Glucose-Capillary: 118 mg/dL — ABNORMAL HIGH (ref 70–99)
Glucose-Capillary: 102 mg/dL — ABNORMAL HIGH (ref 70–99)
Glucose-Capillary: 123 mg/dL — ABNORMAL HIGH (ref 70–99)

## 2019-08-13 NOTE — Progress Notes (Signed)
[place] Hospital Day(s): 6.   Post op day(s): 6 Days Post-Op.   Interval History:  Patient seen and examined no acute events or new complaints overnight.  Patient reports he is doing well, abdominal soreness No fever, chills, nausea, or emesis WBC remains in normal ranges today Renal function is at baseline; U/O - 1.2 L in last 24 hours Tolerated advancement of diet; still with ileostomy function  No new complaints; awaiting delivery of DME Equipment prior to discharge    Vital signs in last 24 hours: [min-max] current  Temp:  [97.5 F (36.4 C)-98.3 F (36.8 C)] 97.5 F (36.4 C) (02/19 0745) Pulse Rate:  [60-82] 82 (02/19 0830) Resp:  [10-22] 22 (02/19 0830) BP: (123-188)/(61-94) 188/94 (02/19 0830) SpO2:  [95 %-100 %] 97 % (02/19 0830)     Height: 5\' 7"  (170.2 cm) Weight: 85.4 kg BMI (Calculated): 29.48   Intake/Output last 2 shifts:  02/18 0701 - 02/19 0700 In: 270.7 [I.V.:49; IV Piggyback:221.7] Out: 1870 [Urine:1200; Drains:435; Stool:235]   Physical Exam:  Constitutional: alert, cooperative and no distress Respiratory: breathing non-labored at rest  Cardiovascular: regular rate and sinus rhythm  Gastrointestinal: soft,incisional soreness, and non-distended, no rebound/guarding. Ileostomy in the left abdomen, ostomy is pink and patent,there is gas and stool present in the bag. JP drains in both LLQ and RLQ with serosanguinous output Integumentary:Laparotomy incision is CDI with staples and honeycomb, no appreciable erythema   Labs:  CBC Latest Ref Rng & Units 08/13/2019 08/12/2019 08/11/2019  WBC 4.0 - 10.5 K/uL 8.9 9.1 10.7(H)  Hemoglobin 13.0 - 17.0 g/dL 11.7(L) 11.7(L) 11.5(L)  Hematocrit 39.0 - 52.0 % 37.5(L) 36.6(L) 36.0(L)  Platelets 150 - 400 K/uL 198 189 201   CMP Latest Ref Rng & Units 08/13/2019 08/12/2019 08/11/2019  Glucose 70 - 99 mg/dL 143(H) 127(H) 135(H)  BUN 8 - 23 mg/dL 53(H) 55(H) 62(H)  Creatinine  0.61 - 1.24 mg/dL 1.72(H) 1.72(H) 1.78(H)  Sodium 135 - 145 mmol/L 144 143 145  Potassium 3.5 - 5.1 mmol/L 4.3 4.1 4.0  Chloride 98 - 111 mmol/L 111 112(H) 111  CO2 22 - 32 mmol/L 27 26 24   Calcium 8.9 - 10.3 mg/dL 8.0(L) 8.1(L) 8.3(L)  Total Protein 6.5 - 8.1 g/dL - - 5.1(L)  Total Bilirubin 0.3 - 1.2 mg/dL - - 0.7  Alkaline Phos 38 - 126 U/L - - 38  AST 15 - 41 U/L - - 35  ALT 0 - 44 U/L - - 111(H)     Imaging studies: No new pertinent imaging studies   Assessment/Plan:  79 y.o. male doing well 6 Days Post-Op s/p exploratory laparotomy, subtotal colostomy, and creation of end ileostomyfor pneumoperitoneum and large bowel obstructionmost likely secondary to invasive adenocarcinoma on pathology, complicated by pertinent comorbidities includingCOVID +.   - Advance to soft diet; wean from IVF   - Pain control prn; antiemetics prn - Monitor abdominal examination; ileostomy function - Appreciate WOC RN assistance with ileostomy care -Mobilize as tolerates; PT following; current recommendations are SNF --> however son plans to take him home with home health -Oncology following; recommending surveillance in 3 months - Appreciate medicine assistance; home medication recommendations appreciated   - Discharge Planning: Anticipate discharge tomorrow, DME to be delivered late this evening, will prep for anticipated discharge in the morning, will need general surgery follow up in 1 week, ABx to complete 7 days, pain control. Will also see cardiology in 2 weeks and prescription for amiodarone 200 mg  BID  All of the above findings and recommendations were discussed with the patient, and the medical team, and all of patient's questions were answered to his expressed satisfaction.  -- Edison Simon, PA-C [facility] 08/13/2019, 12:15 PM [phone] M-F: 7am - 4pm

## 2019-08-13 NOTE — Progress Notes (Signed)
Patient ID: Phillip Allison., male   DOB: [birth year], 79 y.o.   MRN: [id number] [place] [facility] [place] NAME: Phillip Allison    MR#:  [id number]  DATE OF BIRTH:  10-17-1940  SUBJECTIVE:  Improving. HR controlled REVIEW OF SYSTEMS:   Review of Systems  Constitutional: Positive for malaise/fatigue. Negative for chills, fever and weight loss.  HENT: Negative for ear discharge, ear pain and nosebleeds.   Eyes: Negative for blurred vision, pain and discharge.  Respiratory: Negative for sputum production, shortness of breath, wheezing and stridor.   Cardiovascular: Negative for chest pain, palpitations, orthopnea and PND.  Gastrointestinal: Negative for diarrhea, nausea and vomiting.  Genitourinary: Negative for frequency and urgency.  Musculoskeletal: Negative for back pain and joint pain.  Neurological: Positive for weakness. Negative for sensory change, speech change and focal weakness.  Psychiatric/Behavioral: Negative for depression and hallucinations. The patient is not nervous/anxious.    Tolerating Diet: Ice chips Tolerating PT: Recommends SNF  DRUG ALLERGIES:  No Known Allergies  VITALS:  Blood pressure (!) 157/87, pulse 65, temperature (!) 97.5 F (36.4 C), temperature source Oral, resp. rate 16, height 5\' 7"  (1.702 m), weight 85.4 kg, SpO2 97 %.  PHYSICAL EXAMINATION:   Physical Exam  GENERAL:  78 y.o.-year-old patient lying in the bed with no acute distress. critically ill EYES: Pupils equal, round, reactive to light and accommodation. No scleral icterus.   HEENT: Head atraumatic, normocephalic. Oropharynx and nasopharynx clear.  NECK:  Supple, no jugular venous distention. No thyroid enlargement, no tenderness.  LUNGS: decreased breath sounds bilaterally, no wheezing, rales, rhonchi. No use of accessory muscles of respiration.  CARDIOVASCULAR: S1, S2 normal. No murmurs, rubs, or gallops. tachycardia ABDOMEN: Soft,  nontender, nondistended. Bowel sounds present. No organomegaly or mass. Surgical dressing+ EXTREMITIES: No cyanosis, clubbing or edema b/l.    NEUROLOGIC: Cranial nerves II through XII are intact. No focal Motor or sensory deficits b/l.  weak PSYCHIATRIC:  patient is alert and oriented x 3.  SKIN: No obvious rash, lesion, or ulcer.  LABORATORY PANEL:  CBC Recent Labs  Lab 08/13/19 0515  WBC 8.9  HGB 11.7*  HCT 37.5*  PLT 198    Chemistries  Recent Labs  Lab 08/10/19 0420 08/10/19 0420 08/11/19 0439 08/12/19 0456 08/13/19 0515  NA 146*   < > 145   < > 144  K 3.9   < > 4.0   < > 4.3  CL 110   < > 111   < > 111  CO2 27   < > 24   < > 27  GLUCOSE 134*   < > 135*   < > 143*  BUN 68*   < > 62*   < > 53*  CREATININE 2.17*   < > 1.78*   < > 1.72*  CALCIUM 8.3*   < > 8.3*   < > 8.0*  MG 2.1  --   --   --   --   AST 76*   < > 35  --   --   ALT 155*   < > 111*  --   --   ALKPHOS 40   < > 38  --   --   BILITOT 0.8   < > 0.7  --   --    < > = values in this interval not displayed.   Cardiac Enzymes No results for input(s): TROPONINI in the last 168 hours. RADIOLOGY:  No results found. ASSESSMENT AND PLAN:  [name] with PMH of COPD on 2L [place], HFrEF, CAD,andCKD III admitted 2/13/21with severepneumoperitoneumand perforated viscus where he was taken foremergency exploratory Laparotomy.He required subtotal colectomy with end ileostomy  Acute hypoxic/hypercarbic respiratory failure from sepsis with perforated viscous and COVID-19 infection -patient presented February 13 with severe  pneumoperitoneum underwent emergent exploratory laparotomy requiring subtotal colectomy with and ileostomy by Dr. Hampton Abbot -tolerating soft diet -PRN pain meds by surgery  Pneumoperitoneum due to Large bowel obstruction -found to have severe stricture of sigmoid colon and prelim path results showed Invasive adenocarcinoma --Oncology not planning chemo   Severe sepsis secondary to perforated  viscous present on admission with acute renal failure/ATN with h/o CKD III Baseline creat 1.7 -creat 2.30-->1.7 -good urine output -Resume Lasix -IV Zosyn -blood culture remains negative  Pneumonia secondary to covid-19 infection -Completed remdesivir (day 5/5) and continue decadron -continue oxygen and wean as tolerated -as needed bronchodilator  A fib with RVR, acute -cardiology consultation with Dr. Nehemiah Massed -On Coreg 6.25 mg p.o. twice daily -Considering nonsustained V. tach earlier today cardiology has increased the dose of amiodarone to 400 mg oral twice a day for now can be tapered to 200 mg p.o. twice a day at discharge  Congestive heart failure chronic systolic and CAD -EF of 43% -Restart small dose of Lasix - he's +6 L fluid balance -coreg, pravastatin  -Enteresto on hold (high creat).  Resume at discharge  History of COPD on chronic home oxygen 2 L -bronchodilators as needed -currently on steroids  Depression Cont zoloft  CODE STATUS: DNR DVT Prophylaxis :heparin   Discharge planning per primary team.  He has completed remdesivir course and medically stable for D/C.  TOTAL TIME TAKING CARE OF THIS PATIENT: *35* minutes.  >50% time spent on counselling and coordination of care  Note: This dictation was prepared with Dragon dictation along with smaller phrase technology. Any transcriptional errors that result from this process are unintentional.  [name] M.D    [facility]   CC: Primary care physician; Jerrol Banana., MD

## 2019-08-13 NOTE — Care Management Important Message (Signed)
Important Message  Patient Details  Name: Phillip Allison. MRN: [id number] Date of Birth: 10/17/1940   Medicare Important Message Given:  Yes     Shelbie Ammons, RN 08/13/2019, 10:49 AM

## 2019-08-13 NOTE — Progress Notes (Signed)
Pharmacy Antibiotic Note  Phillip Allison. is a 79 y.o. male admitted on 08/07/2019 with intraabdominal infection. Patient admited through the emergency department with distended abdomen and 2 day history of abdominal pain. Patient Positive for Coronavirus on 2/13; per H&P patient is asymptomatic. Pharmacy has been consulted for Zosyn dosing. Patient received cefepime, metronidazole, and vancomycin in ED on 2/13.   Plan: Continue Zosyn EI  3.375g IV Q8hr. This is day 6 of therapy.  Height: 5\' 7"  (170.2 cm) Weight: 188 lb 4.4 oz (85.4 kg) IBW/kg (Calculated) : 66.1  Temp (24hrs), Avg:97.8 F (36.6 C), Min:97.5 F (36.4 C), Max:98.3 F (36.8 C)  Recent Labs  Lab 08/07/19 1220 08/07/19 1220 08/07/19 2201 08/08/19 0119 08/09/19 0440 08/10/19 0420 08/11/19 0439 08/12/19 0456 08/13/19 0515  WBC 30.5*   < > 18.3*   < > 17.7* 15.6* 10.7* 9.1 8.9  CREATININE 2.49*   < > 2.30*   < > 2.27* 2.17* 1.78* 1.72* 1.72*  LATICACIDVEN 6.4*  --  1.8  --   --   --   --   --   --    < > = values in this interval not displayed.    Estimated Creatinine Clearance: 37.5 mL/min (A) (by C-G formula based on SCr of 1.72 mg/dL (H)).    No Known Allergies  Antimicrobials this admission: Vancomycin 2/13 x 1 Cefepime 2/13 x 1 Metronidazole 2/13 x 1 Zosyn 2/13 >>   Dose adjustments this admission: N/A  Microbiology results: 2/14 respiratory Cx: WBC present (PMN, mononuclear), rare Candida Albicans 2/13 BCx: NG  2/13 SARS Coronavirus 2: positive  2/13 Influenza A/B: negative   Thank you for allowing pharmacy to be a part of this patient's care.  Paulina Fusi, PharmD, BCPS 08/13/2019 10:00 AM

## 2019-08-13 NOTE — Consult Note (Signed)
[place] Nurse ostomy consult note Stoma type/location: RUQ ileostomy Stomal assessment/size: 1 and 1/4 inches round, red, moist, budded. Os at center. Peristomal assessment: Two areas of MARSI (Medical adhesive related skin injury) at border of 2 and 3/4 inch system.  Treatment options for stomal/peristomal skin: Adhesive removed and not reapplied to area, moisture barrier cream applied. Output: seedy brown effluent. Ostomy pouching: 2pc. 2 and 1/4 inch pouching system with skin barrier ring Education provided:  Explained role of ostomy nurse and creation of stoma  Explained stoma characteristics (budded, flush, color, texture, care) Demonstrated pouch change (cutting new skin barrier, measuring stoma, cleaning peristomal skin and stoma, use of barrier ring) Education on emptying when 1/3 to 1/2 full and how to empty Demonstrated "burping" flatus from pouch Demonstrated use of wick to clean spout  Provided patient with [place] patient/family questions.    Supplies ordered to room: 5 rings, 5 pouches, 5 skin barriers.  Son is present for session today and assisted in cutting out pouch, stretching skin barrier ring and demonstrated Lock and Roll closure and simulated emptying.  HHRN has been requested.  Enrolled patient in [place] program: Yes, today  [place] nursing team will follow, and will remain available to this patient, the nursing and medical teams.    Thanks, Maudie Flakes, MSN, RN, [place], Arther Abbott  Pager# [phone]

## 2019-08-14 LAB — GLUCOSE, CAPILLARY
Glucose-Capillary: 123 mg/dL — ABNORMAL HIGH (ref 70–99)
Glucose-Capillary: 167 mg/dL — ABNORMAL HIGH (ref 70–99)
Glucose-Capillary: 164 mg/dL — ABNORMAL HIGH (ref 70–99)

## 2019-08-14 LAB — CEA: CEA: 1.4 ng/mL (ref 0.0–4.7)

## 2019-08-14 MED ORDER — SODIUM CHLORIDE 0.9% FLUSH
10.0000 mL | INTRAVENOUS | Status: DC | PRN
  Administered 2019-08-14: 08:00:00 40 mL

## 2019-08-14 MED ORDER — AMOXICILLIN-POT CLAVULANATE 875-125 MG PO TABS: 1.0000 | ORAL_TABLET | Freq: Two times a day (BID) | ORAL | 0 refills | Status: AC

## 2019-08-14 MED ORDER — OXYCODONE HCL 5 MG PO TABS: 5.0000 mg | ORAL_TABLET | Freq: Four times a day (QID) | ORAL | 0 refills | Status: DC | PRN

## 2019-08-14 MED ORDER — SODIUM CHLORIDE 0.9% FLUSH
10.0000 mL | Freq: Two times a day (BID) | INTRAVENOUS | Status: DC
  Administered 2019-08-14: 10 mL

## 2019-08-14 MED ORDER — AMIODARONE HCL 400 MG PO TABS: 200.0000 mg | ORAL_TABLET | Freq: Two times a day (BID) | ORAL | 0 refills | Status: AC

## 2019-08-14 NOTE — Progress Notes (Signed)
Patient discharged home via EMS at 2033, No complaints. Family notified.

## 2019-08-14 NOTE — Progress Notes (Signed)
Notified by telemetry of patients QTC being prolonged. After careful review of telemetry strips over time and measurement of QTC it is clear that QTC  Is unchanged over last several days however itn is noted that QTC was charted as .470 when it was in fact .520 -.530 on strip dated 08/13/19 at 1902pm. MD Dr Celine Ahr notified of QTC. No new changes. DC orders in place.

## 2019-08-14 NOTE — Progress Notes (Signed)
Pt requesting to be [facility] via EMS. In communication with LCSW to see if this is a possibility given patient medical needs.

## 2019-08-14 NOTE — TOC Transition Note (Signed)
Transition of Care [facility]) - CM/SW Discharge Note   Patient Details  Name: Phillip Allison. MRN: [id number] Date of Birth: 06-17-1941  [facility] of [facility]) CM/SW Contact:  Boris Sharper, LCSW Phone Number: [phone] 08/14/2019, 1:57 PM   Clinical Narrative:    Pt medically stable for discharge. CSW spoke to pt son and he stated that all equipment had been delivered to the home. CSW will setup EMS transport.    Final next level of care: [place] Barriers to Discharge: No Barriers Identified   Patient Goals and CMS Choice Patient states their goals for this hospitalization and ongoing recovery are:: to go home CMS Medicare.gov Compare Post Acute Care list provided to:: Other (Comment Required)(N/A) Choice offered to / list presented to : NA  Discharge Placement                Patient to be transferred to facility by: [facility] Name of family member notified: [name] (son) Patient and family notified of of transfer: 08/14/19  Discharge Plan and Services   Discharge Planning Services: CM Consult Post Acute Care Choice: Home Health          DME Arranged: 3-N-1, Lightweight manual wheelchair with seat cushion, Hospital bed DME Agency: [employer] Date DME Agency Contacted: 08/12/19 Time DME Agency Contacted: [id number] Representative spoke with at DME Agency: Emailed [place] HH Arranged: RN, PT, OT, Nurse's Aide [place] Agency: [place] ([place]) Date [place]: 08/11/19 Time [place]: 1355 Representative spoke with at [place]: [place] ([place]) Interventions     Readmission Risk Interventions No flowsheet data found.

## 2019-08-14 NOTE — Discharge Summary (Signed)
Physician Discharge Summary  Patient ID: Phillip Allison. MRN: [id number] DOB/AGE: October 13, 1940 79 y.o.  Admit date: 08/07/2019 Discharge date: 08/14/2019  Admission Diagnoses:  Discharge Diagnoses:  Active Problems:   Sepsis ([place])   Pneumoperitoneum   Acute renal failure with acute tubular necrosis superimposed on stage 3 chronic kidney disease (HCC)   Atrial fibrillation (HCC)   Goals of care, counseling/discussion   Palliative care by specialist   Discharged Condition: stable  Hospital Course: This is a 79 year old man who presented to the emergency department on October 05, 2019 with a 2-day history of abdominal pain.  He reported that the pain started in his lower abdomen the day prior to presentation.  It progressively worsened.  He had a syncopal event and was brought to the emergency department via EMS.  Evaluation demonstrated significant pneumoperitoneum with pneumatosis of the right and transverse colon.  He was taken emergently to the operating room where he was found to have a short segment of severe stricture of the sigmoid colon which had caused a large bowel obstruction.  There was patchy necrosis proximal to this, all the way to the cecum.  He underwent a subtotal colectomy with creation of an end ileostomy.  He was initially managed in the intensive care unit but was able to be transitioned to the general care floor where he progressed appropriately.  He received ostomy education and reports that he is able to manage the ostomy with the assistance of his family.  He had an episode of atrial fibrillation while in the intensive care unit and was initiated on amiodarone, with which he ultimately achieved normal sinus rhythm.  He was also found to be Covid positive and received treatment for this while in the hospital.  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  At this time, due to his multiple comorbidities and stage of cancer, no additional  treatment is being recommended.  On the day of his discharge, he was tolerating a diet, his pain was well controlled, and his vital signs were within acceptable limits.  Home health care has been arranged.  He will follow-up in general surgery clinic next week with additional follow-up in cardiology and oncology.  Consults: cardiology, pulmonary/intensive care and oncology  Significant Diagnostic Studies: radiology: CT scan: With findings of gross pneumoperitoneum which led to his exploratory laparotomy and subtotal colectomy.  Treatments: IV hydration, antibiotics: Zosyn, analgesia: Multimodal analgesia, cardiac meds: amiodarone and surgery: Exploratory laparotomy, subtotal colectomy, end ileostomy.  Discharge Exam: Blood pressure (!) 118/55, pulse 64, temperature 97.6 F (36.4 C), temperature source Axillary, resp. rate 13, height 5\' 7"  (1.702 m), weight 85.4 kg, SpO2 99 %. General appearance: alert, cooperative, appears stated age and no distress Resp: Normal work of breathing, no respiratory distress. Cardio: NSR GI: Soft, appropriately tender for postoperative state.  Drains with serosanguineous fluid.  Ostomy with dark brown loose stool. Incision/Wound: No concern for infection  Disposition: Discharge disposition: 01-Home or Self Care       Discharge Instructions    Call MD for:  persistant nausea and vomiting   Complete by: As directed    Call MD for:  redness, tenderness, or signs of infection (pain, swelling, redness, odor or green/yellow discharge around incision site)   Complete by: As directed    Call MD for:  severe uncontrolled pain   Complete by: As directed    Call MD for:  temperature >100.4   Complete by: As directed    Diet -  low sodium heart healthy   Complete by: As directed    Discharge instructions   Complete by: As directed    Follow up with general surgery in 1 week Follow up with cardiology in 2 weeks No lifting more than 15-20 lbs for 4-6 weeks   Complete ABx course Monitor and record drain output   Increase activity slowly   Complete by: As directed    Lifting restrictions   Complete by: As directed    No lifting more than 15-20 lbs for 4-6 weeks     Allergies as of 08/14/2019   No Known Allergies     Medication List    TAKE these medications   albuterol (2.5 MG/3ML) 0.083% nebulizer solution Commonly known as: PROVENTIL Take 3 mLs (2.5 mg total) by nebulization every 6 (six) hours as needed for wheezing or shortness of breath.   amiodarone 400 MG tablet Commonly known as: PACERONE Take 0.5 tablets (200 mg total) by mouth 2 (two) times daily.   amLODipine 5 MG tablet Commonly known as: NORVASC Take 1 tablet (5 mg total) by mouth daily.   amoxicillin-clavulanate 875-125 MG tablet Commonly known as: Augmentin Take 1 tablet by mouth 2 (two) times daily for 7 days.   aspirin 81 MG tablet Take 81 mg by mouth at bedtime.   carvedilol 6.25 MG tablet Commonly known as: COREG Take 1 tablet (6.25 mg total) by mouth 2 (two) times daily with a meal.   Entresto 24-26 MG Generic drug: sacubitril-valsartan Take 1 tablet by mouth 2 (two) times daily.   furosemide 20 MG tablet Commonly known as: LASIX Take 20 mg by mouth 2 (two) times daily.   guaiFENesin 600 MG 12 hr tablet Commonly known as: MUCINEX Take 1 tablet (600 mg total) by mouth 2 (two) times daily.   oxyCODONE 5 MG immediate release tablet Commonly known as: Oxy IR/ROXICODONE Take 1 tablet (5 mg total) by mouth every 6 (six) hours as needed for severe pain.   pravastatin 40 MG tablet Commonly known as: PRAVACHOL Take 1 tablet (40 mg total) by mouth at bedtime.   sertraline 50 MG tablet Commonly known as: ZOLOFT TAKE 1 TABLET(50 MG) BY MOUTH DAILY What changed: See the new instructions.            Durable Medical Equipment  (From admission, onward)         Start     Ordered   08/12/19 1644  For home use only DME lightweight manual  wheelchair with seat cushion  Once    Comments: Patient suffers from [place] 19 and general deconditioning which impairs their ability to perform daily activities like walking, bathing, and toileting in the home.  A walker will not resolve issue with performing activities of daily living. A wheelchair will allow patient to safely perform daily activities. Patient is not able to propel themselves in the home using a standard weight wheelchair due to weakness. Patient can self propel in the lightweight wheelchair. Length of need lifetime. Accessories: elevating leg rests (ELRs), wheel locks, extensions and anti-tippers, seat cushion and back cushion.   08/12/19 1645   08/12/19 1641  For home use only DME 3 n 1  Once     08/12/19 1640   08/12/19 1640  For home use only DME Hospital bed  Once    Question Answer Comment  Length of Need Lifetime   Patient has (list medical condition): COVID 19, total colectomy with end illeostomy, colon cancer   The above  medical condition requires: Patient requires the ability to reposition frequently   Head must be elevated greater than: 30 degrees   Bed type Semi-electric   Hoyer Lift Yes   Support Surface: Gel Overlay      08/12/19 1640   08/12/19 1640  For home use only DME Walker rolling  Once    Question Answer Comment  Walker: With [place]   Patient needs a walker to treat with the following condition Weakness      08/12/19 1640         Follow-up Information    Piscoya, Jacqulyn Bath, MD. Schedule an appointment as soon as possible for a visit in 1 week(s).   Specialty: General Surgery Why: s/p ex lap and subtotal colectomy Contact information: [address] [place] [address] [phone]        Corey Skains, MD. Schedule an appointment as soon as possible for a visit in 2 week(s).   Specialty: Cardiology Why: Hospital follow up, atrial fibrillation Contact information: [address] [facility] [place] [address] [phone]           Signed: Fredirick Maudlin 08/14/2019, 11:19 AM

## 2019-08-14 NOTE — Progress Notes (Signed)
AVS provided to son, [name] and aftercare instructions reviewed with him over the phone. Questions encouraged and answered. Pt prepared for discharge. Awaiting DNR form from MD. Central line removed per order and per protocol, patient tolerated well and is in good spirits. Wound care supplies sent home in bag to last several days.

## 2019-08-16 ENCOUNTER — Telehealth: Payer: Self-pay | Admitting: Family Medicine

## 2019-08-16 DIAGNOSIS — I5022 Chronic systolic (congestive) heart failure: Secondary | ICD-10-CM | POA: Diagnosis not present

## 2019-08-16 DIAGNOSIS — I252 Old myocardial infarction: Secondary | ICD-10-CM | POA: Diagnosis not present

## 2019-08-16 DIAGNOSIS — I2510 Atherosclerotic heart disease of native coronary artery without angina pectoris: Secondary | ICD-10-CM | POA: Diagnosis not present

## 2019-08-16 DIAGNOSIS — Z4803 Encounter for change or removal of drains: Secondary | ICD-10-CM | POA: Diagnosis not present

## 2019-08-16 DIAGNOSIS — I4891 Unspecified atrial fibrillation: Secondary | ICD-10-CM | POA: Diagnosis not present

## 2019-08-16 DIAGNOSIS — U071 COVID-19: Secondary | ICD-10-CM | POA: Diagnosis not present

## 2019-08-16 DIAGNOSIS — E7800 Pure hypercholesterolemia, unspecified: Secondary | ICD-10-CM | POA: Diagnosis not present

## 2019-08-16 DIAGNOSIS — K219 Gastro-esophageal reflux disease without esophagitis: Secondary | ICD-10-CM | POA: Diagnosis not present

## 2019-08-16 DIAGNOSIS — N183 Chronic kidney disease, stage 3 unspecified: Secondary | ICD-10-CM | POA: Diagnosis not present

## 2019-08-16 DIAGNOSIS — C189 Malignant neoplasm of colon, unspecified: Secondary | ICD-10-CM | POA: Diagnosis not present

## 2019-08-16 DIAGNOSIS — Z483 Aftercare following surgery for neoplasm: Secondary | ICD-10-CM | POA: Diagnosis not present

## 2019-08-16 DIAGNOSIS — I130 Hypertensive heart and chronic kidney disease with heart failure and stage 1 through stage 4 chronic kidney disease, or unspecified chronic kidney disease: Secondary | ICD-10-CM | POA: Diagnosis not present

## 2019-08-16 DIAGNOSIS — J9622 Acute and chronic respiratory failure with hypercapnia: Secondary | ICD-10-CM | POA: Diagnosis not present

## 2019-08-16 DIAGNOSIS — Z4801 Encounter for change or removal of surgical wound dressing: Secondary | ICD-10-CM | POA: Diagnosis not present

## 2019-08-16 DIAGNOSIS — N170 Acute kidney failure with tubular necrosis: Secondary | ICD-10-CM | POA: Diagnosis not present

## 2019-08-16 DIAGNOSIS — E785 Hyperlipidemia, unspecified: Secondary | ICD-10-CM | POA: Diagnosis not present

## 2019-08-16 DIAGNOSIS — J439 Emphysema, unspecified: Secondary | ICD-10-CM | POA: Diagnosis not present

## 2019-08-16 DIAGNOSIS — Z9049 Acquired absence of other specified parts of digestive tract: Secondary | ICD-10-CM | POA: Diagnosis not present

## 2019-08-16 DIAGNOSIS — Z432 Encounter for attention to ileostomy: Secondary | ICD-10-CM | POA: Diagnosis not present

## 2019-08-16 DIAGNOSIS — I255 Ischemic cardiomyopathy: Secondary | ICD-10-CM | POA: Diagnosis not present

## 2019-08-16 DIAGNOSIS — F329 Major depressive disorder, single episode, unspecified: Secondary | ICD-10-CM | POA: Diagnosis not present

## 2019-08-16 DIAGNOSIS — N529 Male erectile dysfunction, unspecified: Secondary | ICD-10-CM | POA: Diagnosis not present

## 2019-08-16 DIAGNOSIS — I051 Rheumatic mitral insufficiency: Secondary | ICD-10-CM | POA: Diagnosis not present

## 2019-08-16 DIAGNOSIS — J9621 Acute and chronic respiratory failure with hypoxia: Secondary | ICD-10-CM | POA: Diagnosis not present

## 2019-08-16 DIAGNOSIS — I454 Nonspecific intraventricular block: Secondary | ICD-10-CM | POA: Diagnosis not present

## 2019-08-16 NOTE — Telephone Encounter (Signed)
Mardene Celeste advised.   Thanks,   -Mickel Baas

## 2019-08-16 NOTE — Telephone Encounter (Signed)
Phillip Allison, with [facility], calling to request VO for skilled nursing.  Frequency: 2w3 and 1w6 CB# [phone]

## 2019-08-16 NOTE — Telephone Encounter (Signed)
Ok thanks 

## 2019-08-17 ENCOUNTER — Telehealth: Payer: Self-pay | Admitting: Family Medicine

## 2019-08-18 ENCOUNTER — Telehealth: Payer: Self-pay | Admitting: Family Medicine

## 2019-08-18 DIAGNOSIS — J9622 Acute and chronic respiratory failure with hypercapnia: Secondary | ICD-10-CM | POA: Diagnosis not present

## 2019-08-18 DIAGNOSIS — Z483 Aftercare following surgery for neoplasm: Secondary | ICD-10-CM | POA: Diagnosis not present

## 2019-08-18 DIAGNOSIS — C189 Malignant neoplasm of colon, unspecified: Secondary | ICD-10-CM | POA: Diagnosis not present

## 2019-08-18 DIAGNOSIS — J9621 Acute and chronic respiratory failure with hypoxia: Secondary | ICD-10-CM | POA: Diagnosis not present

## 2019-08-18 DIAGNOSIS — U071 COVID-19: Secondary | ICD-10-CM | POA: Diagnosis not present

## 2019-08-18 DIAGNOSIS — J439 Emphysema, unspecified: Secondary | ICD-10-CM | POA: Diagnosis not present

## 2019-08-18 NOTE — Telephone Encounter (Signed)
Phillip Allison was given verbal okay. Rx request was for a Front wheel walker.

## 2019-08-18 NOTE — Telephone Encounter (Signed)
Copied from [place] [phone]. Topic: Quick Communication - Home Health Verbal Orders >> Aug 18, 2019  4:03 PM Jodie Echevaria wrote: Caller/Agency: Merry Proud / [place] Number: [phone] ok to LM Requesting OT/PT/Skilled Nursing/Social Work/Speech Therapy: PT  Frequency: 2 x1, 3 w 2, 2 w 3, 1 w 2 need  Rx sent to [facility] supply Fax# [phone]

## 2019-08-18 NOTE — Telephone Encounter (Signed)
ok 

## 2019-08-19 ENCOUNTER — Telehealth: Payer: Self-pay

## 2019-08-19 DIAGNOSIS — J439 Emphysema, unspecified: Secondary | ICD-10-CM | POA: Diagnosis not present

## 2019-08-19 DIAGNOSIS — J9622 Acute and chronic respiratory failure with hypercapnia: Secondary | ICD-10-CM | POA: Diagnosis not present

## 2019-08-19 DIAGNOSIS — U071 COVID-19: Secondary | ICD-10-CM | POA: Diagnosis not present

## 2019-08-19 DIAGNOSIS — J9621 Acute and chronic respiratory failure with hypoxia: Secondary | ICD-10-CM | POA: Diagnosis not present

## 2019-08-19 DIAGNOSIS — Z483 Aftercare following surgery for neoplasm: Secondary | ICD-10-CM | POA: Diagnosis not present

## 2019-08-19 DIAGNOSIS — C189 Malignant neoplasm of colon, unspecified: Secondary | ICD-10-CM | POA: Diagnosis not present

## 2019-08-19 NOTE — Telephone Encounter (Signed)
Order was faxed.

## 2019-08-19 NOTE — Telephone Encounter (Signed)
Telephone call to patient to schedule palliative care visit with patient. Patient/family in agreement with home visit on 08-24-19 at 1:30PM.

## 2019-08-20 DIAGNOSIS — U071 COVID-19: Secondary | ICD-10-CM | POA: Diagnosis not present

## 2019-08-20 DIAGNOSIS — J439 Emphysema, unspecified: Secondary | ICD-10-CM | POA: Diagnosis not present

## 2019-08-20 DIAGNOSIS — J9621 Acute and chronic respiratory failure with hypoxia: Secondary | ICD-10-CM | POA: Diagnosis not present

## 2019-08-20 DIAGNOSIS — J9622 Acute and chronic respiratory failure with hypercapnia: Secondary | ICD-10-CM | POA: Diagnosis not present

## 2019-08-20 DIAGNOSIS — Z483 Aftercare following surgery for neoplasm: Secondary | ICD-10-CM | POA: Diagnosis not present

## 2019-08-20 DIAGNOSIS — C189 Malignant neoplasm of colon, unspecified: Secondary | ICD-10-CM | POA: Diagnosis not present

## 2019-08-24 ENCOUNTER — Telehealth: Payer: Self-pay | Admitting: Family Medicine

## 2019-08-24 ENCOUNTER — Other Ambulatory Visit: Payer: Medicare Other

## 2019-08-24 ENCOUNTER — Other Ambulatory Visit: Payer: Self-pay

## 2019-08-24 DIAGNOSIS — C189 Malignant neoplasm of colon, unspecified: Secondary | ICD-10-CM | POA: Diagnosis not present

## 2019-08-24 DIAGNOSIS — Z483 Aftercare following surgery for neoplasm: Secondary | ICD-10-CM | POA: Diagnosis not present

## 2019-08-24 DIAGNOSIS — J9621 Acute and chronic respiratory failure with hypoxia: Secondary | ICD-10-CM | POA: Diagnosis not present

## 2019-08-24 DIAGNOSIS — J9622 Acute and chronic respiratory failure with hypercapnia: Secondary | ICD-10-CM | POA: Diagnosis not present

## 2019-08-24 DIAGNOSIS — Z515 Encounter for palliative care: Secondary | ICD-10-CM

## 2019-08-24 DIAGNOSIS — J439 Emphysema, unspecified: Secondary | ICD-10-CM | POA: Diagnosis not present

## 2019-08-24 DIAGNOSIS — U071 COVID-19: Secondary | ICD-10-CM | POA: Diagnosis not present

## 2019-08-24 LAB — SURGICAL PATHOLOGY

## 2019-08-24 NOTE — Progress Notes (Signed)
COMMUNITY PALLIATIVE CARE SW NOTE  PATIENT NAME: Phillip Allison. DOB: 08-14-1940 MRN: [id number]  PRIMARY CARE PROVIDER: Jerrol Banana., MD  RESPONSIBLE PARTY:  Acct ID - Guarantor Home Phone Work Phone Relationship Acct Type  [id number] - [place]* [id number]  Self P/F     [address], [place], [place] [address]     PLAN OF CARE and INTERVENTIONS:             1. GOALS OF CARE/ ADVANCE CARE PLANNING:  Patient is DNR, form is in the home. HCPOA is [name]. Goal is for patient to decide on his plan of care.  2. SOCIAL/EMOTIONAL/SPIRITUAL ASSESSMENT/ INTERVENTIONS:  SW and RN met with patient and [name] (patient's son) in the home. [name] provided brief history and discussed patient's decline. Patient is not interested in any chemotherapy or cancer treatment options. Patient reports pain in his right side. Patient said his appetite is fair. Patient is not resting well and overall, he is much weaker. Patient is a widower. Patient retired, worked at [employer]. 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 Patient has three adult sons. Patient lives with his son and wife. Patient is member of [facility]. Long discussion on hospice services and patient's goals. Patient would like to continue discussion with son and follow-up with team. SW provided education on palliative and hospice services, provided emotional support, validated concerns and used active and reflective listening. 3. PATIENT/CAREGIVER EDUCATION/ COPING:  Patient is alert, oriented. Patient will answer questions when asked. Patient copes with joking and expressing feelings. [name] and his wife are very supportive. 4. PERSONAL EMERGENCY PLAN:  Family will call 9-1-1 for emergencies.  5. COMMUNITY RESOURCES COORDINATION/ HEALTH CARE NAVIGATION:  [name] helps coordinate care and appointments. Patient is receiving weekly nursing and PT with [facility]. 6. FINANCIAL/LEGAL CONCERNS/INTERVENTIONS:  None.      SOCIAL HX:  Social History   Tobacco Use  . Smoking status: Former Smoker    Packs/day: 0.75    Years: 54.00    Pack years: 40.50    Types: Cigarettes    Quit date: 2014    Years since quitting: 7.1  . Smokeless tobacco: Never Used  . Tobacco comment: 2014?  Substance Use Topics  . Alcohol use: No    CODE STATUS:   Code Status: Prior (DNR) ADVANCED DIRECTIVES: Y MOST FORM COMPLETE:  No. HOSPICE EDUCATION PROVIDED: Yes.  PPS: Patient is mostly dependent of ADLs. Patient is bedbound. Patient is able to feed himself.  I spent 60 minutes with patient/family, from 1:30-2:30p providing education, support and consultation.    Margaretmary Lombard, LCSW

## 2019-08-24 NOTE — Telephone Encounter (Signed)
Copied from [place] [phone]. Topic: General - Other >> Aug 24, 2019  3:49 PM Keene Breath wrote: Reason for CRM: Called to request Hospice care for patient and to ask if Dr. Rosanna Randy will  be the attending physician.  CB# [phone]

## 2019-08-24 NOTE — Telephone Encounter (Signed)
Phillip Allison was advised.

## 2019-08-24 NOTE — Telephone Encounter (Signed)
I am willing to be the attending for Phillip Allison.

## 2019-08-24 NOTE — Progress Notes (Signed)
PATIENT NAME: Phillip Allison. DOB: March 02, 1941 MRN: [id number]  PRIMARY CARE PROVIDER: Jerrol Banana., MD  RESPONSIBLE PARTY:  Acct ID - Guarantor Home Phone Work Phone Relationship Acct Type  [id number] - [place]* [phone]  Self P/F     [address], [place], [place] [address]    PLAN OF CARE and INTERVENTIONS:               1.  GOALS OF CARE/ ADVANCE CARE PLANNING:  Patient wants to be comfortable and not receive chemo.               2.  PATIENT/CAREGIVER EDUCATION:  Education on disease progression, education on hospice verses palliative care, education on s/s of infection, support               3.  DISEASE STATUS: SW and RN made scheduled palliative care home visit. Palliative care team met with patient and patients son Phillip Allison. Patient is a 79 year old patient of Dr. Marlan Palau. Patient's past medical history includes but not limited to abdominal aneurysm, cardiovascular disease, arteriosclerosis of coronary artery, cardiomyopathy, heart and renal disease with heart failure, benign essential hypertension, chronic systolic heart failure, A-fib, pacemaker placement, COPD, history of acute chronic respiratory failure, reflux, stage 3 kidney disease, depression, cardiac defibrillator in place, hypermagnesemia and hyperkalemia. Patient has  ileostomy placement 2/21 due to bowel perforation. Patient was hospitalized due to small colon perforation and sepsis. Patient required surgery and had ileostomy placed. Son reports patient has colon cancer and patient reports he has a spot on right lung. Patient states he does not want any treatment for cancer. Patient lying in bed alert and oriented. Patient receiving Home Health through [facility],  Patient receiving nursing and PT services. Patient has staples in place from small bowel surgery with JP drain.  Son emptied 0.5 ounces from JP drain today.  Patient currently rates pain at 3 on pain scale and reports pain is worse with  movement and breathing. Son reports patient takes oxycodone 5 mg or Tylenol for pain. Patient is unable to do ambulate. Patient reports he has not been sleeping well and would like medication to aide with sleep. Nurse requested patient talk with home health nurse about obtaining sleep medication as she is visiting after palliative care team. Patience breath sounds are diminished throughout. Patient denies having any shortness of breath or cough. Patient receiving O2 via nasal cannula at 2 LPM.  Patient reports his appetite is poor and states he but can't eat a lot at one time. Patient has lost at least 15 lbs in the last month. Patients current weight 160 lbs. Patient's vital signs are stable. Patient has no edema in his lower extremities.  Palliative care team discussed benefits of hospice care as patient wants to be comfortable.  Patient and son to discuss Hospice Care this evening and son to call palliative care team tomorrow if patient wants hospice care. Patients son Phillip Allison is patient's health care power-of-attorney. Patient has a DNR in the home. Nurse reviewed medications with patient son. Support provided to patient and son. Patient and son encouraged to contact palliative care with questions or concerns.     HISTORY OF PRESENT ILLNESS:  Patient is a 79 year old male who resides in home with his son Phillip Allison.  Patient open to palliative care services and likely will transition to hospice soon.    CODE STATUS: DNR  ADVANCED DIRECTIVES: Yes MOST FORM: No PPS: 30%  PHYSICAL EXAM:   VITALS: Today's Vitals   08/24/19 1430  BP: (!) 102/58  Pulse: 60  Resp: 16  Temp: 97.6 F (36.4 C)  TempSrc: Temporal  SpO2: 98%  Weight: 160 lb (72.6 kg)  Height: _0  (1.803 m)  PainSc: 3   PainLoc: Abdomen    LUNGS: decreased breath sounds CARDIAC: Cor RRR  EXTREMITIES: none edema SKIN: surgical wound on abdomen home health managing wound care  NEURO: positive for gait problems and  weakness       Nilda Simmer, RN

## 2019-08-25 ENCOUNTER — Telehealth: Payer: Self-pay

## 2019-08-25 NOTE — Telephone Encounter (Signed)
-----   Message from Phillip Ree, MD sent at 08/24/2019  5:06 PM EST ----- Regarding: follow up Hi,   This patient was discharged on 2/20, after exlap and subtotal colectomy.  For some reason, no follow up appointment was set up on discharge.  He still has staples and drains.  Can you please schedule him at his earliest convenience?  Thanks,  [employer]

## 2019-08-25 NOTE — Telephone Encounter (Signed)
Message left for the patient to call the office to schedule his post op appointment with Dr Hampton Abbot.

## 2019-08-26 DIAGNOSIS — K219 Gastro-esophageal reflux disease without esophagitis: Secondary | ICD-10-CM | POA: Diagnosis not present

## 2019-08-26 DIAGNOSIS — I130 Hypertensive heart and chronic kidney disease with heart failure and stage 1 through stage 4 chronic kidney disease, or unspecified chronic kidney disease: Secondary | ICD-10-CM | POA: Diagnosis not present

## 2019-08-26 DIAGNOSIS — C189 Malignant neoplasm of colon, unspecified: Secondary | ICD-10-CM | POA: Diagnosis not present

## 2019-08-26 DIAGNOSIS — C7802 Secondary malignant neoplasm of left lung: Secondary | ICD-10-CM | POA: Diagnosis not present

## 2019-08-26 DIAGNOSIS — J449 Chronic obstructive pulmonary disease, unspecified: Secondary | ICD-10-CM | POA: Diagnosis not present

## 2019-08-26 DIAGNOSIS — I252 Old myocardial infarction: Secondary | ICD-10-CM | POA: Diagnosis not present

## 2019-08-26 DIAGNOSIS — E785 Hyperlipidemia, unspecified: Secondary | ICD-10-CM | POA: Diagnosis not present

## 2019-08-26 DIAGNOSIS — Z9981 Dependence on supplemental oxygen: Secondary | ICD-10-CM | POA: Diagnosis not present

## 2019-08-26 DIAGNOSIS — Z87891 Personal history of nicotine dependence: Secondary | ICD-10-CM | POA: Diagnosis not present

## 2019-08-26 DIAGNOSIS — I714 Abdominal aortic aneurysm, without rupture: Secondary | ICD-10-CM | POA: Diagnosis not present

## 2019-08-26 DIAGNOSIS — C7801 Secondary malignant neoplasm of right lung: Secondary | ICD-10-CM | POA: Diagnosis not present

## 2019-08-26 DIAGNOSIS — I2510 Atherosclerotic heart disease of native coronary artery without angina pectoris: Secondary | ICD-10-CM | POA: Diagnosis not present

## 2019-08-26 DIAGNOSIS — F329 Major depressive disorder, single episode, unspecified: Secondary | ICD-10-CM | POA: Diagnosis not present

## 2019-08-26 DIAGNOSIS — N183 Chronic kidney disease, stage 3 unspecified: Secondary | ICD-10-CM | POA: Diagnosis not present

## 2019-08-26 DIAGNOSIS — Z8616 Personal history of COVID-19: Secondary | ICD-10-CM | POA: Diagnosis not present

## 2019-08-26 DIAGNOSIS — Z95810 Presence of automatic (implantable) cardiac defibrillator: Secondary | ICD-10-CM | POA: Diagnosis not present

## 2019-08-26 DIAGNOSIS — Z9049 Acquired absence of other specified parts of digestive tract: Secondary | ICD-10-CM | POA: Diagnosis not present

## 2019-08-26 DIAGNOSIS — J9611 Chronic respiratory failure with hypoxia: Secondary | ICD-10-CM | POA: Diagnosis not present

## 2019-08-26 DIAGNOSIS — M519 Unspecified thoracic, thoracolumbar and lumbosacral intervertebral disc disorder: Secondary | ICD-10-CM | POA: Diagnosis not present

## 2019-08-26 DIAGNOSIS — I5020 Unspecified systolic (congestive) heart failure: Secondary | ICD-10-CM | POA: Diagnosis not present

## 2019-08-26 DIAGNOSIS — I4891 Unspecified atrial fibrillation: Secondary | ICD-10-CM | POA: Diagnosis not present

## 2019-08-26 DIAGNOSIS — C7889 Secondary malignant neoplasm of other digestive organs: Secondary | ICD-10-CM | POA: Diagnosis not present

## 2019-08-26 DIAGNOSIS — Z432 Encounter for attention to ileostomy: Secondary | ICD-10-CM | POA: Diagnosis not present

## 2019-08-26 NOTE — Telephone Encounter (Signed)
Spoke with the patient's son, [name], and the patent is now with Hospice care. He will be unable to come into the office for a follow up appointment as he is bed fast. They will be in contact with our office regarding care for him post surgery.

## 2019-08-27 DIAGNOSIS — I714 Abdominal aortic aneurysm, without rupture: Secondary | ICD-10-CM | POA: Diagnosis not present

## 2019-08-27 DIAGNOSIS — I2510 Atherosclerotic heart disease of native coronary artery without angina pectoris: Secondary | ICD-10-CM | POA: Diagnosis not present

## 2019-08-27 DIAGNOSIS — C7889 Secondary malignant neoplasm of other digestive organs: Secondary | ICD-10-CM | POA: Diagnosis not present

## 2019-08-27 DIAGNOSIS — C189 Malignant neoplasm of colon, unspecified: Secondary | ICD-10-CM | POA: Diagnosis not present

## 2019-08-27 DIAGNOSIS — C7801 Secondary malignant neoplasm of right lung: Secondary | ICD-10-CM | POA: Diagnosis not present

## 2019-08-27 DIAGNOSIS — C7802 Secondary malignant neoplasm of left lung: Secondary | ICD-10-CM | POA: Diagnosis not present

## 2019-08-30 DIAGNOSIS — C189 Malignant neoplasm of colon, unspecified: Secondary | ICD-10-CM | POA: Diagnosis not present

## 2019-08-30 DIAGNOSIS — I2510 Atherosclerotic heart disease of native coronary artery without angina pectoris: Secondary | ICD-10-CM | POA: Diagnosis not present

## 2019-08-30 DIAGNOSIS — I714 Abdominal aortic aneurysm, without rupture: Secondary | ICD-10-CM | POA: Diagnosis not present

## 2019-08-30 DIAGNOSIS — C7801 Secondary malignant neoplasm of right lung: Secondary | ICD-10-CM | POA: Diagnosis not present

## 2019-08-30 DIAGNOSIS — C7889 Secondary malignant neoplasm of other digestive organs: Secondary | ICD-10-CM | POA: Diagnosis not present

## 2019-08-30 DIAGNOSIS — C7802 Secondary malignant neoplasm of left lung: Secondary | ICD-10-CM | POA: Diagnosis not present

## 2019-09-01 DIAGNOSIS — I2510 Atherosclerotic heart disease of native coronary artery without angina pectoris: Secondary | ICD-10-CM | POA: Diagnosis not present

## 2019-09-01 DIAGNOSIS — C7802 Secondary malignant neoplasm of left lung: Secondary | ICD-10-CM | POA: Diagnosis not present

## 2019-09-01 DIAGNOSIS — C7801 Secondary malignant neoplasm of right lung: Secondary | ICD-10-CM | POA: Diagnosis not present

## 2019-09-01 DIAGNOSIS — I714 Abdominal aortic aneurysm, without rupture: Secondary | ICD-10-CM | POA: Diagnosis not present

## 2019-09-01 DIAGNOSIS — C189 Malignant neoplasm of colon, unspecified: Secondary | ICD-10-CM | POA: Diagnosis not present

## 2019-09-01 DIAGNOSIS — C7889 Secondary malignant neoplasm of other digestive organs: Secondary | ICD-10-CM | POA: Diagnosis not present

## 2019-09-06 ENCOUNTER — Telehealth: Payer: Self-pay | Admitting: Oncology

## 2019-09-06 ENCOUNTER — Ambulatory Visit: Payer: Medicare Other

## 2019-09-06 ENCOUNTER — Encounter: Payer: Medicare Other | Admitting: Family Medicine

## 2019-09-06 ENCOUNTER — Other Ambulatory Visit: Payer: Self-pay | Admitting: Oncology

## 2019-09-06 DIAGNOSIS — C189 Malignant neoplasm of colon, unspecified: Secondary | ICD-10-CM

## 2019-09-06 NOTE — Telephone Encounter (Signed)
Writer was asked to schedule CT and follow up with MD; however, writer spoke with son and was informed that patient was under Hospice care. MD made aware and asked to cancel CT and MD. Writer spoke with son and informed appts with [facility] are cancelled.

## 2019-09-07 DIAGNOSIS — I2510 Atherosclerotic heart disease of native coronary artery without angina pectoris: Secondary | ICD-10-CM | POA: Diagnosis not present

## 2019-09-07 DIAGNOSIS — C7802 Secondary malignant neoplasm of left lung: Secondary | ICD-10-CM | POA: Diagnosis not present

## 2019-09-07 DIAGNOSIS — C189 Malignant neoplasm of colon, unspecified: Secondary | ICD-10-CM | POA: Diagnosis not present

## 2019-09-07 DIAGNOSIS — I714 Abdominal aortic aneurysm, without rupture: Secondary | ICD-10-CM | POA: Diagnosis not present

## 2019-09-07 DIAGNOSIS — C7889 Secondary malignant neoplasm of other digestive organs: Secondary | ICD-10-CM | POA: Diagnosis not present

## 2019-09-07 DIAGNOSIS — C7801 Secondary malignant neoplasm of right lung: Secondary | ICD-10-CM | POA: Diagnosis not present

## 2019-09-08 ENCOUNTER — Other Ambulatory Visit: Payer: Self-pay | Admitting: Family Medicine

## 2019-09-08 NOTE — Telephone Encounter (Signed)
Anderson Malta with hospice is calling in on pt's behalf to request a refill for pt's 2 medications.   oxyCODONE (OXY IR/ROXICODONE) 5 MG immediate release tablet  Tamazapam 15 MG at bedtime   Pharmacy: [facility], [address] [place] [address]  Phone:  [phone] Fax:  [phone]

## 2019-09-09 MED ORDER — OXYCODONE HCL 5 MG PO TABS: 5.0000 mg | ORAL_TABLET | Freq: Four times a day (QID) | ORAL | 0 refills | Status: DC | PRN

## 2019-09-13 NOTE — Telephone Encounter (Signed)
Please advise 

## 2019-09-13 NOTE — Addendum Note (Signed)
Addended by: Gerald Stabs on: 09/13/2019 03:12 PM   Modules accepted: Orders

## 2019-09-13 NOTE — Addendum Note (Signed)
Addended by: Gerald Stabs on: 09/13/2019 03:14 PM   Modules accepted: Orders

## 2019-09-13 NOTE — Telephone Encounter (Signed)
Anderson Malta calling with hospice called and stated that she would like medication sent to  [place] [place] [address] Phone:  [phone]  Fax:  [phone]     They did not get medication from [facility]. Please advise

## 2019-09-14 DIAGNOSIS — I714 Abdominal aortic aneurysm, without rupture: Secondary | ICD-10-CM | POA: Diagnosis not present

## 2019-09-14 DIAGNOSIS — C7801 Secondary malignant neoplasm of right lung: Secondary | ICD-10-CM | POA: Diagnosis not present

## 2019-09-14 DIAGNOSIS — C189 Malignant neoplasm of colon, unspecified: Secondary | ICD-10-CM | POA: Diagnosis not present

## 2019-09-14 DIAGNOSIS — C7802 Secondary malignant neoplasm of left lung: Secondary | ICD-10-CM | POA: Diagnosis not present

## 2019-09-14 DIAGNOSIS — C7889 Secondary malignant neoplasm of other digestive organs: Secondary | ICD-10-CM | POA: Diagnosis not present

## 2019-09-14 DIAGNOSIS — I2510 Atherosclerotic heart disease of native coronary artery without angina pectoris: Secondary | ICD-10-CM | POA: Diagnosis not present

## 2019-09-14 MED ORDER — OXYCODONE HCL 5 MG PO TABS: 5.0000 mg | ORAL_TABLET | Freq: Four times a day (QID) | ORAL | 0 refills | Status: AC | PRN

## 2019-09-16 DIAGNOSIS — C7801 Secondary malignant neoplasm of right lung: Secondary | ICD-10-CM | POA: Diagnosis not present

## 2019-09-16 DIAGNOSIS — C7802 Secondary malignant neoplasm of left lung: Secondary | ICD-10-CM | POA: Diagnosis not present

## 2019-09-16 DIAGNOSIS — C7889 Secondary malignant neoplasm of other digestive organs: Secondary | ICD-10-CM | POA: Diagnosis not present

## 2019-09-16 DIAGNOSIS — C189 Malignant neoplasm of colon, unspecified: Secondary | ICD-10-CM | POA: Diagnosis not present

## 2019-09-16 DIAGNOSIS — I714 Abdominal aortic aneurysm, without rupture: Secondary | ICD-10-CM | POA: Diagnosis not present

## 2019-09-16 DIAGNOSIS — I2510 Atherosclerotic heart disease of native coronary artery without angina pectoris: Secondary | ICD-10-CM | POA: Diagnosis not present

## 2019-09-19 DIAGNOSIS — I2510 Atherosclerotic heart disease of native coronary artery without angina pectoris: Secondary | ICD-10-CM | POA: Diagnosis not present

## 2019-09-19 DIAGNOSIS — C7801 Secondary malignant neoplasm of right lung: Secondary | ICD-10-CM | POA: Diagnosis not present

## 2019-09-19 DIAGNOSIS — C7889 Secondary malignant neoplasm of other digestive organs: Secondary | ICD-10-CM | POA: Diagnosis not present

## 2019-09-19 DIAGNOSIS — I714 Abdominal aortic aneurysm, without rupture: Secondary | ICD-10-CM | POA: Diagnosis not present

## 2019-09-19 DIAGNOSIS — C7802 Secondary malignant neoplasm of left lung: Secondary | ICD-10-CM | POA: Diagnosis not present

## 2019-09-19 DIAGNOSIS — C189 Malignant neoplasm of colon, unspecified: Secondary | ICD-10-CM | POA: Diagnosis not present

## 2019-09-23 DEATH — deceased

## 2019-09-27 ENCOUNTER — Telehealth: Payer: Self-pay

## 2019-09-27 NOTE — Telephone Encounter (Signed)
The Certificate is up front with Phillip Allison. Messages have been left to have it picked up.   Reason for CRM: 11-11-2022 from [employer] called stating she needs death certificate asap. 11-11-2022 states she dropped off certificate last week. Call back [phone]

## 2019-11-01 ENCOUNTER — Other Ambulatory Visit: Payer: Medicare Other

## 2019-11-01 ENCOUNTER — Ambulatory Visit: Payer: Medicare Other

## 2019-11-02 ENCOUNTER — Ambulatory Visit: Payer: Medicare Other | Admitting: Oncology

## 2020-02-22 IMAGING — CT CT CHEST LUNG CANCER SCREENING LOW DOSE W/O CM
2 of 5 series · 15 of 40 positions shown, 18 images · non-contrast
Comparison: None.

CLINICAL DATA: 76-year-old male former smoker, quit 5 years ago,
with 41 pack-year history of smoking, for initial lung cancer
screening

EXAM:
CT CHEST WITHOUT CONTRAST LOW-DOSE FOR LUNG CANCER SCREENING
TECHNIQUE: Multidetector CT imaging of the chest was performed following the
standard protocol without IV contrast.

[Series 3: lung · axial · 0.68mm/px · z∈[-1396,-1064]mm · 12 of 366 slices shown, 15 images (1 of 2)]
[im 17/366  mediastinal]
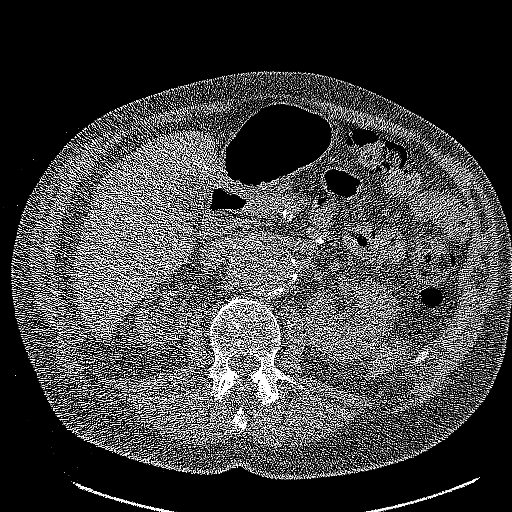
[im 17/366  lung]
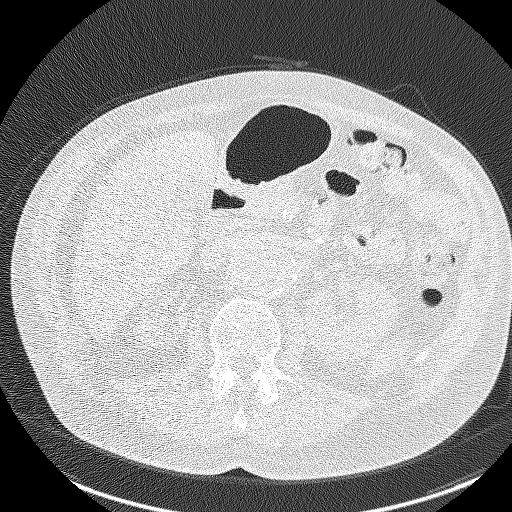
[im 50/366  lung]
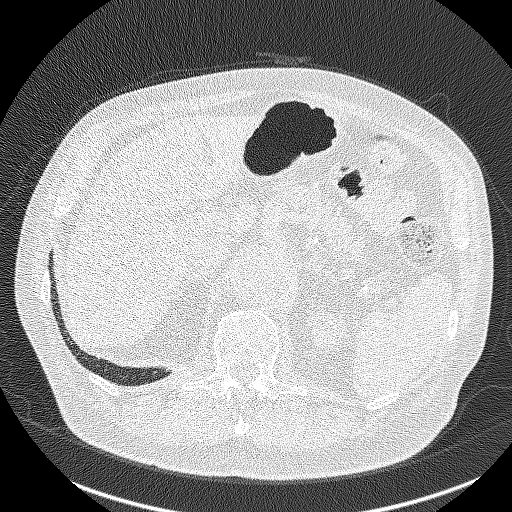
[im 83/366  lung]
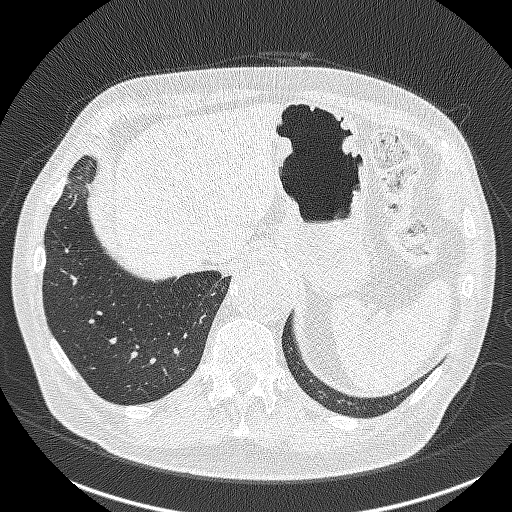
[im 117/366  lung]
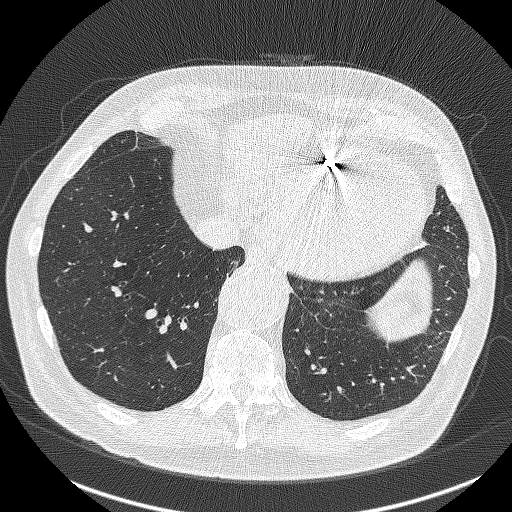
[im 133/366  mediastinal]
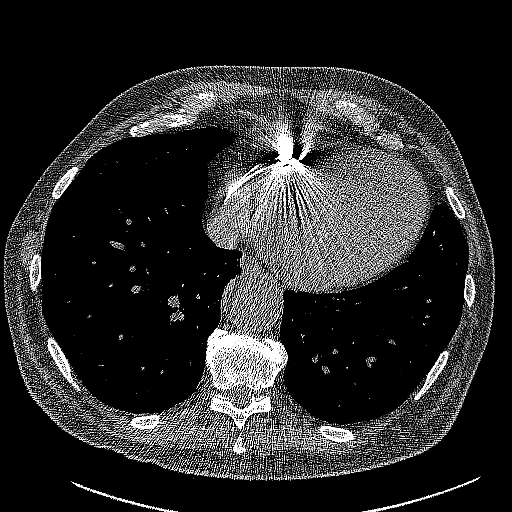
[im 133/366  lung]
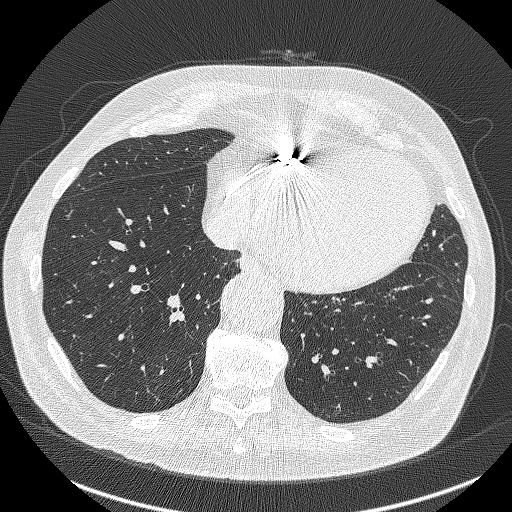
[im 166/366  lung]
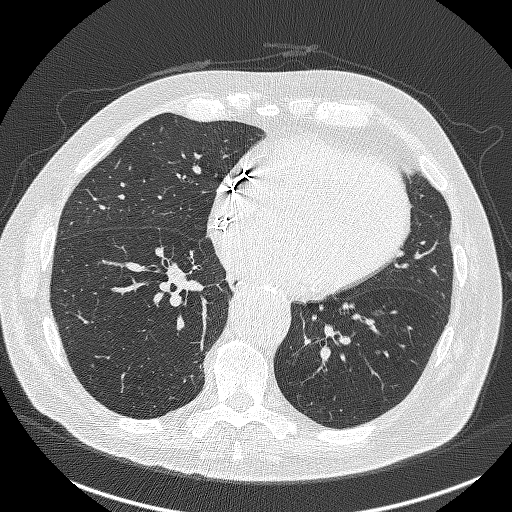
[im 200/366  lung]
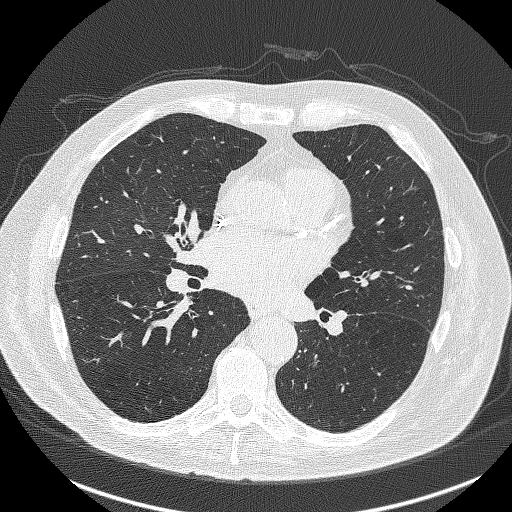
[im 233/366  lung]
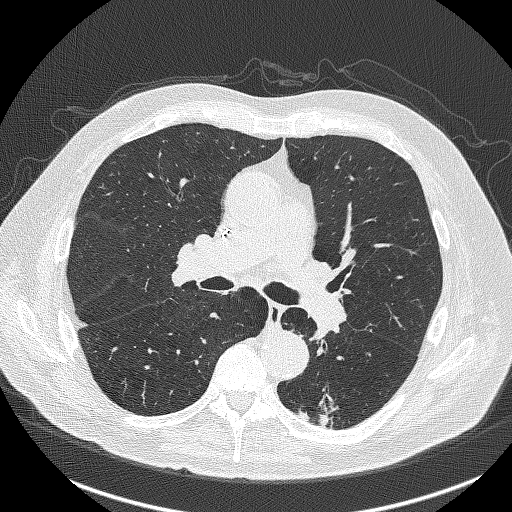
[im 249/366  mediastinal]
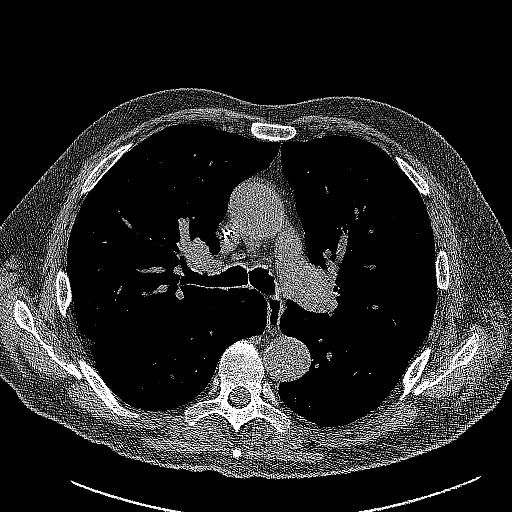
[im 249/366  lung]
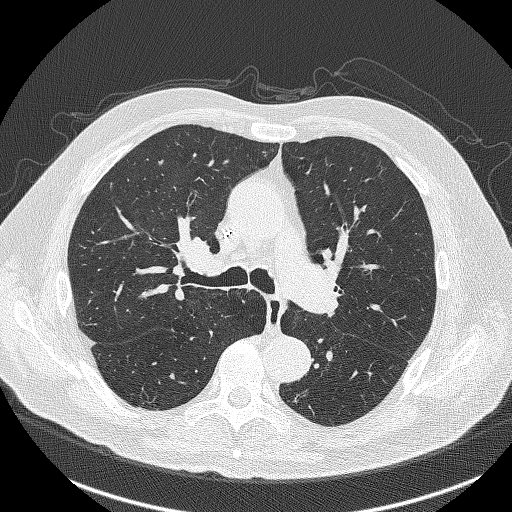
[im 283/366  lung]
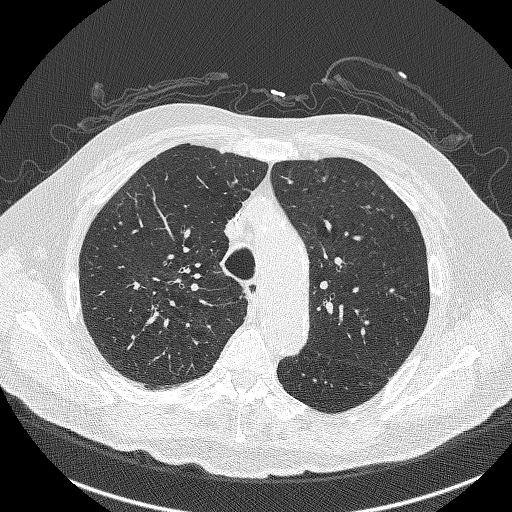
[im 316/366  lung]
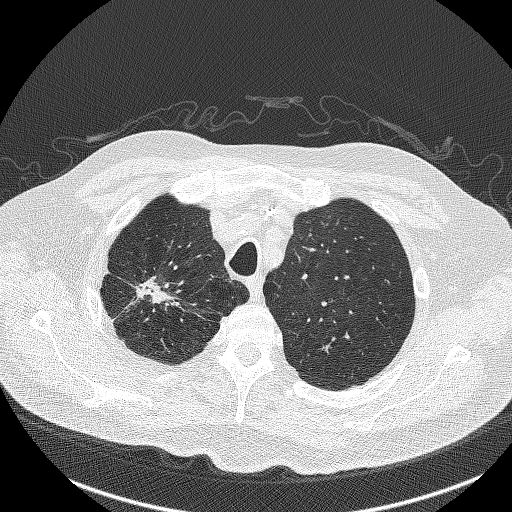
[im 349/366  lung]
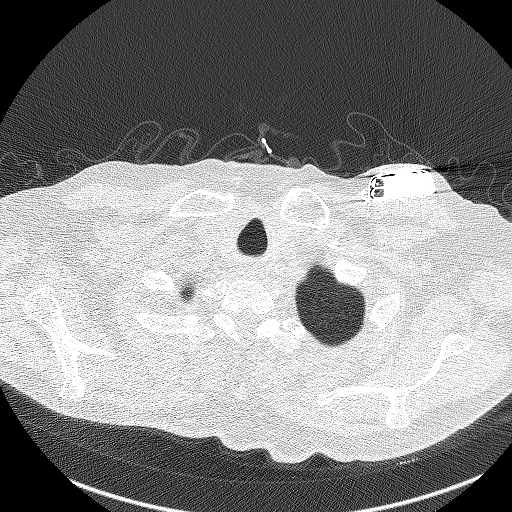

[Series 4: lung · coronal · 0.68mm/px · 3 of 363 slices shown (2 of 2)]
[im 73/363  lung]
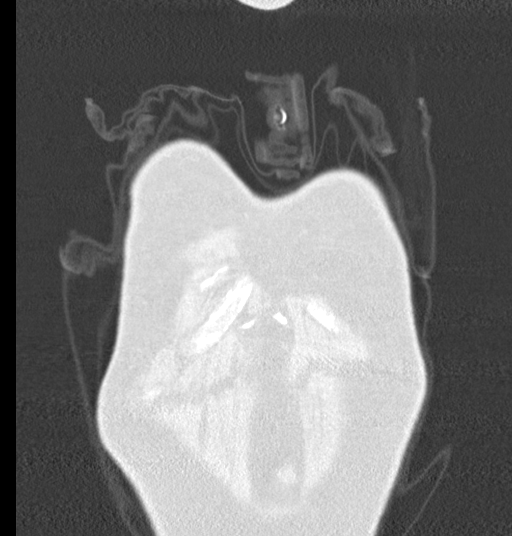
[im 145/363  lung]
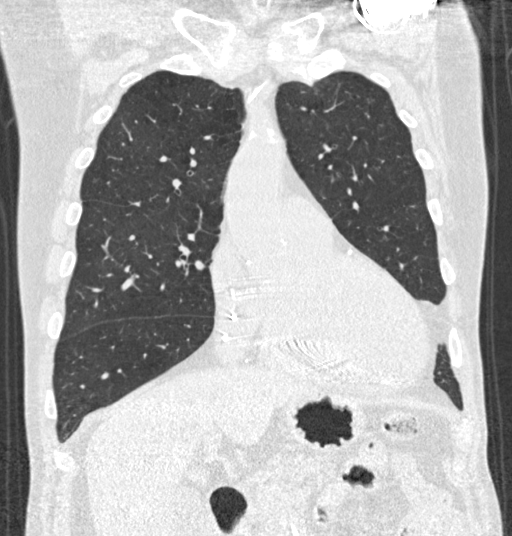
[im 218/363  lung]
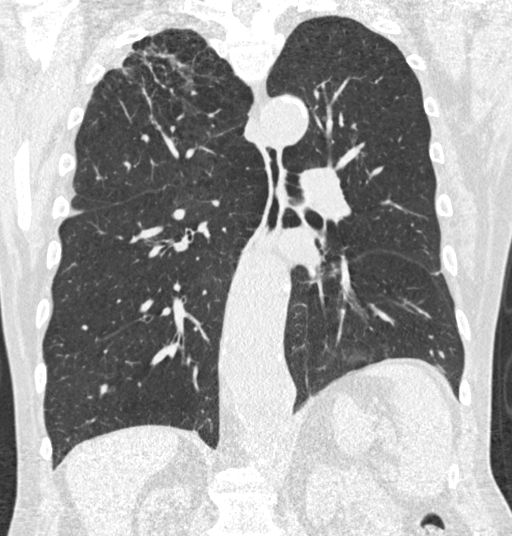

[15 of 40 positions shown; findings below may reference images not displayed]

FINDINGS: Cardiovascular: Heart is normal in size.  No pericardial effusion.

No evidence of thoracic aortic aneurysm. Atherosclerotic
calcifications of the aortic arch.

Three vessel coronary atherosclerosis.

Left subclavian ICD.

Mediastinum/Nodes: No suspicious mediastinal lymphadenopathy.

Visualized thyroid is unremarkable.

Lungs/Pleura: Right upper lobe pleural parenchymal scarring.
Associated 11.7 mm nodular component medially (image 54).

Clustered/segmental nodular opacities in the posterior left lower
lobe, measuring up to 8.7 mm.

Mild centrilobular and paraseptal emphysematous changes, upper lobe
predominant.

No focal consolidation.

No pleural effusion or pneumothorax.

Upper Abdomen: Visualized upper abdomen is notable for right renal
scarring/atrophy and vascular calcifications.

Musculoskeletal: Degenerative changes of the visualized
thoracolumbar spine.
IMPRESSION: Lung-RADS 4A, suspicious. Follow up low-dose chest CT without
contrast in 3 months (please use the following order, "CT CHEST LCS
NODULE FOLLOW-UP W/O CM") is recommended.

Right upper lobe pleural-parenchymal scarring with nodular component
measuring up to 11.7 mm. Additional clustered/segmental nodular
opacities in the posterior left lower lobe, measuring up to 8.7 mm.
Both are favored to be benign but technically deserve 3 month
follow-up due to size.

Aortic Atherosclerosis (G73MI-R45.5) and Emphysema ([id number].J).

## 2021-02-13 IMAGING — CR CHEST - 2 VIEW
1 series · 2 of 2 positions shown · non-contrast
Comparison: 12/15/2017

CLINICAL DATA: Cough, congestion and shortness of breath.

EXAM:
CHEST - 2 VIEW

[Series 1: dg chest 2 view · 0.14mm/px · 2 of 2 slices shown]
[im 1/2]
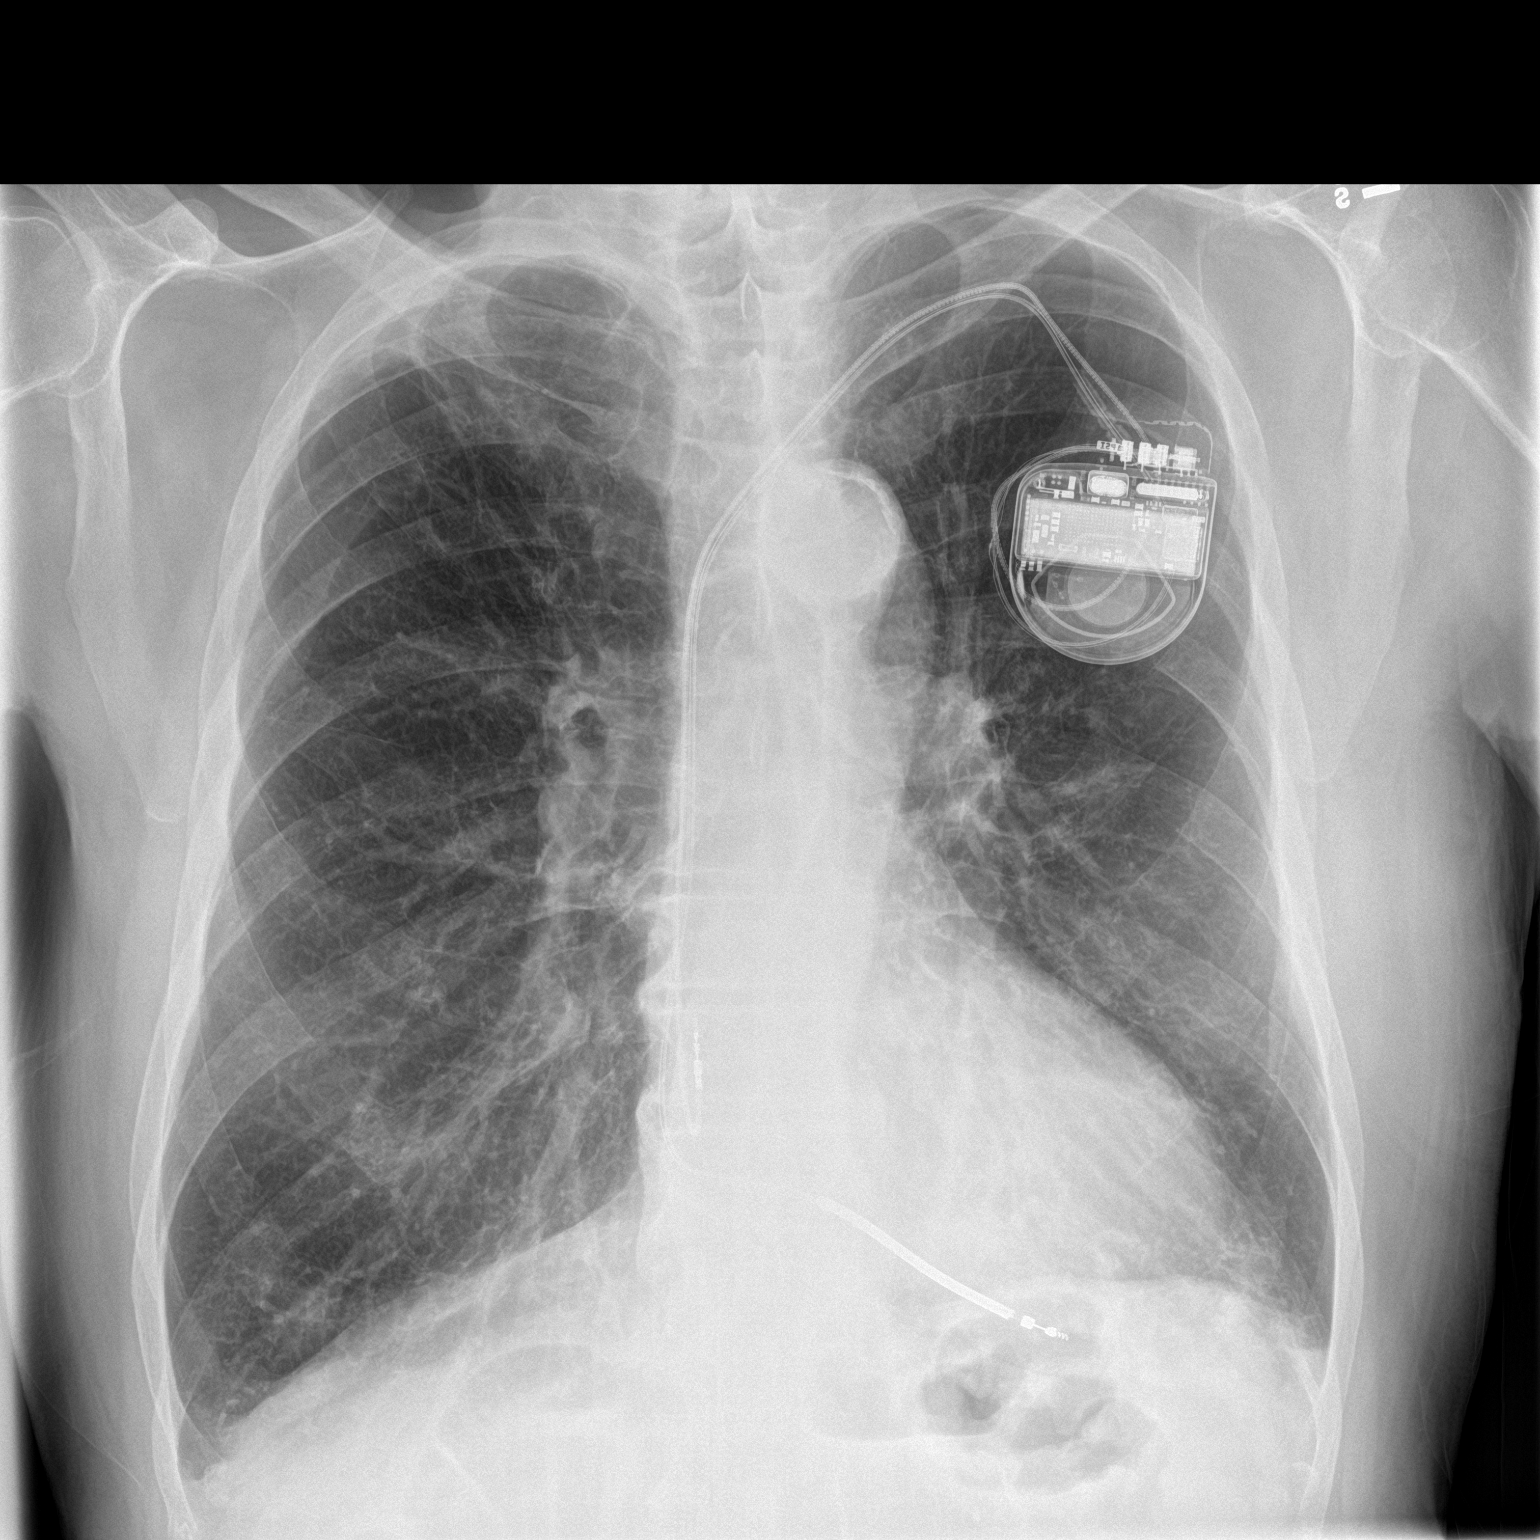
[im 2/2]
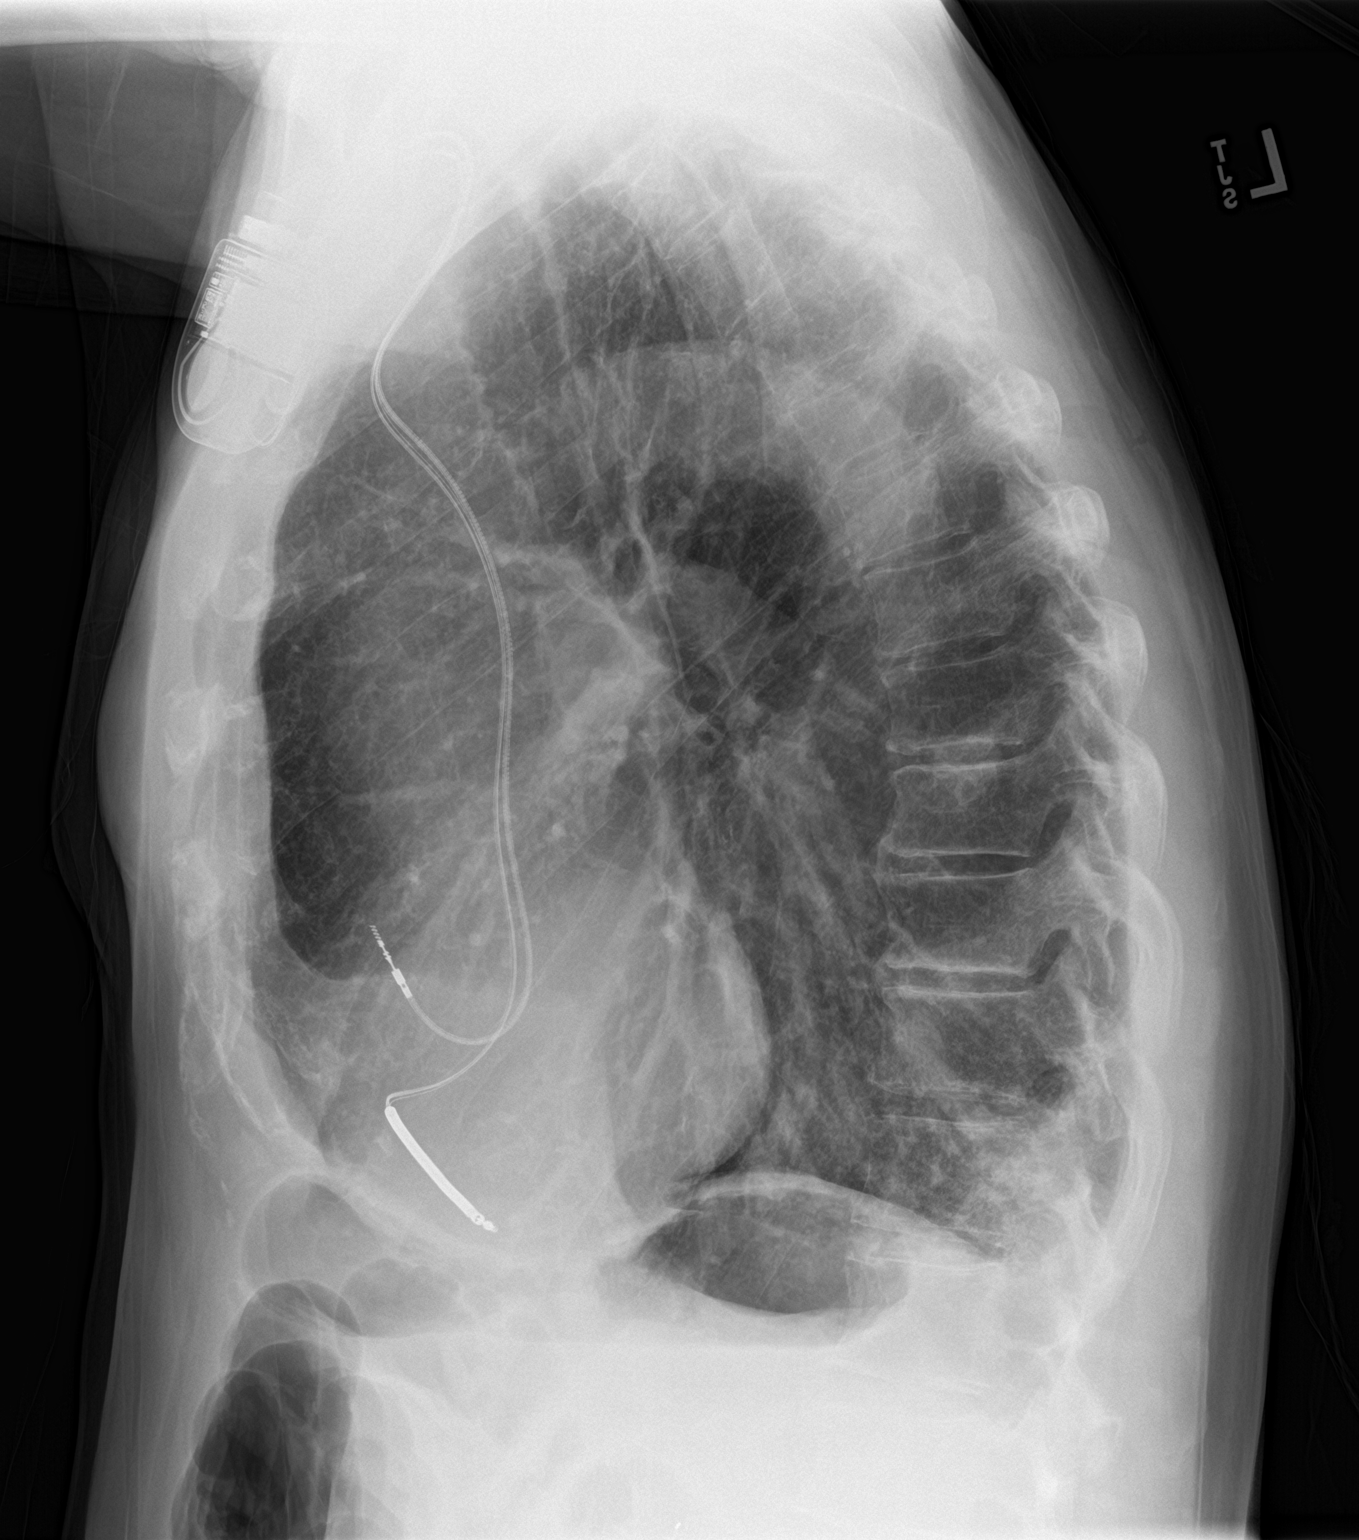

[2 of 2 positions shown; findings below may reference images not displayed]

FINDINGS: Cardiomediastinal silhouette is unchanged. LEFT pacemaker/ICD noted.

LEFT LOWER lobe opacity is suspicious for pneumonia.

COPD/emphysema changes again noted.

No pleural effusion, pneumothorax or acute bony abnormality.
IMPRESSION: LEFT LOWER lobe opacity likely representing pneumonia. Radiographic
follow-up to resolution recommended.

COPD/emphysema.
